# Patient Record
Sex: Female | Born: 1975 | Race: White | Hispanic: No | Smoking: Former smoker
Health system: Southern US, Community
[De-identification: ages and names within clinical notes are randomized; demographics above are authoritative.]

## PROBLEM LIST (undated history)

## (undated) ENCOUNTER — Inpatient Hospital Stay (HOSPITAL_COMMUNITY): Payer: Self-pay

## (undated) ENCOUNTER — Inpatient Hospital Stay: Payer: Self-pay

## (undated) DIAGNOSIS — B009 Herpesviral infection, unspecified: Secondary | ICD-10-CM

## (undated) DIAGNOSIS — F419 Anxiety disorder, unspecified: Secondary | ICD-10-CM

## (undated) DIAGNOSIS — R51 Headache: Secondary | ICD-10-CM

## (undated) DIAGNOSIS — G8929 Other chronic pain: Secondary | ICD-10-CM

## (undated) DIAGNOSIS — R946 Abnormal results of thyroid function studies: Secondary | ICD-10-CM

## (undated) DIAGNOSIS — J189 Pneumonia, unspecified organism: Secondary | ICD-10-CM

## (undated) DIAGNOSIS — R519 Headache, unspecified: Secondary | ICD-10-CM

## (undated) DIAGNOSIS — R590 Localized enlarged lymph nodes: Secondary | ICD-10-CM

## (undated) DIAGNOSIS — M5416 Radiculopathy, lumbar region: Secondary | ICD-10-CM

## (undated) DIAGNOSIS — N390 Urinary tract infection, site not specified: Secondary | ICD-10-CM

## (undated) DIAGNOSIS — R112 Nausea with vomiting, unspecified: Secondary | ICD-10-CM

## (undated) DIAGNOSIS — R002 Palpitations: Secondary | ICD-10-CM

## (undated) DIAGNOSIS — O009 Unspecified ectopic pregnancy without intrauterine pregnancy: Secondary | ICD-10-CM

## (undated) DIAGNOSIS — N920 Excessive and frequent menstruation with regular cycle: Secondary | ICD-10-CM

## (undated) DIAGNOSIS — Z9889 Other specified postprocedural states: Secondary | ICD-10-CM

## (undated) DIAGNOSIS — Z87442 Personal history of urinary calculi: Secondary | ICD-10-CM

## (undated) DIAGNOSIS — D649 Anemia, unspecified: Secondary | ICD-10-CM

## (undated) DIAGNOSIS — F99 Mental disorder, not otherwise specified: Secondary | ICD-10-CM

## (undated) DIAGNOSIS — N200 Calculus of kidney: Secondary | ICD-10-CM

## (undated) DIAGNOSIS — R569 Unspecified convulsions: Secondary | ICD-10-CM

## (undated) DIAGNOSIS — M549 Dorsalgia, unspecified: Secondary | ICD-10-CM

## (undated) HISTORY — DX: Urinary tract infection, site not specified: N39.0

## (undated) HISTORY — PX: LITHOTRIPSY: SUR834

## (undated) HISTORY — PX: TUBAL LIGATION: SHX77

## (undated) HISTORY — PX: CYSTOSCOPY/RETROGRADE/URETEROSCOPY: SHX5316

---

## 1998-12-18 ENCOUNTER — Ambulatory Visit (HOSPITAL_COMMUNITY): Admission: RE | Admit: 1998-12-18 | Discharge: 1998-12-18 | Payer: Self-pay | Admitting: Emergency Medicine

## 1999-04-21 ENCOUNTER — Other Ambulatory Visit: Admission: RE | Admit: 1999-04-21 | Discharge: 1999-04-21 | Payer: Self-pay | Admitting: Obstetrics and Gynecology

## 1999-12-21 ENCOUNTER — Ambulatory Visit (HOSPITAL_COMMUNITY): Admission: RE | Admit: 1999-12-21 | Discharge: 1999-12-21 | Payer: Self-pay | Admitting: Obstetrics and Gynecology

## 2000-04-19 ENCOUNTER — Other Ambulatory Visit: Admission: RE | Admit: 2000-04-19 | Discharge: 2000-04-19 | Payer: Self-pay | Admitting: Obstetrics and Gynecology

## 2000-07-07 ENCOUNTER — Emergency Department (HOSPITAL_COMMUNITY): Admission: EM | Admit: 2000-07-07 | Discharge: 2000-07-07 | Payer: Self-pay | Admitting: Emergency Medicine

## 2000-11-28 ENCOUNTER — Emergency Department (HOSPITAL_COMMUNITY): Admission: EM | Admit: 2000-11-28 | Discharge: 2000-11-28 | Payer: Self-pay | Admitting: Emergency Medicine

## 2000-11-30 ENCOUNTER — Emergency Department (HOSPITAL_COMMUNITY): Admission: EM | Admit: 2000-11-30 | Discharge: 2000-11-30 | Payer: Self-pay | Admitting: *Deleted

## 2001-05-20 ENCOUNTER — Other Ambulatory Visit: Admission: RE | Admit: 2001-05-20 | Discharge: 2001-05-20 | Payer: Self-pay | Admitting: Obstetrics and Gynecology

## 2001-08-18 ENCOUNTER — Ambulatory Visit (HOSPITAL_COMMUNITY): Admission: RE | Admit: 2001-08-18 | Discharge: 2001-08-18 | Payer: Self-pay

## 2002-05-22 ENCOUNTER — Other Ambulatory Visit: Admission: RE | Admit: 2002-05-22 | Discharge: 2002-05-22 | Payer: Self-pay | Admitting: Obstetrics and Gynecology

## 2002-06-09 ENCOUNTER — Emergency Department (HOSPITAL_COMMUNITY): Admission: EM | Admit: 2002-06-09 | Discharge: 2002-06-09 | Payer: Self-pay | Admitting: Emergency Medicine

## 2002-06-09 ENCOUNTER — Encounter: Payer: Self-pay | Admitting: Emergency Medicine

## 2002-06-19 ENCOUNTER — Encounter: Payer: Self-pay | Admitting: Emergency Medicine

## 2002-06-19 ENCOUNTER — Emergency Department (HOSPITAL_COMMUNITY): Admission: EM | Admit: 2002-06-19 | Discharge: 2002-06-19 | Payer: Self-pay | Admitting: Emergency Medicine

## 2003-05-24 ENCOUNTER — Other Ambulatory Visit: Admission: RE | Admit: 2003-05-24 | Discharge: 2003-05-24 | Payer: Self-pay | Admitting: Obstetrics and Gynecology

## 2003-06-30 ENCOUNTER — Emergency Department (HOSPITAL_COMMUNITY): Admission: EM | Admit: 2003-06-30 | Discharge: 2003-06-30 | Payer: Self-pay | Admitting: Emergency Medicine

## 2003-08-10 ENCOUNTER — Emergency Department (HOSPITAL_COMMUNITY): Admission: EM | Admit: 2003-08-10 | Discharge: 2003-08-10 | Payer: Self-pay | Admitting: Emergency Medicine

## 2003-12-21 ENCOUNTER — Emergency Department (HOSPITAL_COMMUNITY): Admission: EM | Admit: 2003-12-21 | Discharge: 2003-12-21 | Payer: Self-pay | Admitting: Emergency Medicine

## 2004-01-26 ENCOUNTER — Emergency Department (HOSPITAL_COMMUNITY): Admission: EM | Admit: 2004-01-26 | Discharge: 2004-01-26 | Payer: Self-pay | Admitting: Emergency Medicine

## 2004-04-05 ENCOUNTER — Ambulatory Visit (HOSPITAL_COMMUNITY): Admission: RE | Admit: 2004-04-05 | Discharge: 2004-04-05 | Payer: Self-pay | Admitting: *Deleted

## 2004-06-16 ENCOUNTER — Other Ambulatory Visit: Admission: RE | Admit: 2004-06-16 | Discharge: 2004-06-16 | Payer: Self-pay | Admitting: Obstetrics and Gynecology

## 2005-07-02 ENCOUNTER — Other Ambulatory Visit: Admission: RE | Admit: 2005-07-02 | Discharge: 2005-07-02 | Payer: Self-pay | Admitting: Obstetrics and Gynecology

## 2005-09-18 ENCOUNTER — Emergency Department (HOSPITAL_COMMUNITY): Admission: EM | Admit: 2005-09-18 | Discharge: 2005-09-18 | Payer: Self-pay | Admitting: Emergency Medicine

## 2005-10-08 ENCOUNTER — Ambulatory Visit (HOSPITAL_COMMUNITY): Admission: AD | Admit: 2005-10-08 | Discharge: 2005-10-08 | Payer: Self-pay | Admitting: Urology

## 2005-10-19 ENCOUNTER — Ambulatory Visit (HOSPITAL_COMMUNITY): Admission: RE | Admit: 2005-10-19 | Discharge: 2005-10-19 | Payer: Self-pay | Admitting: Urology

## 2006-01-10 ENCOUNTER — Ambulatory Visit (HOSPITAL_COMMUNITY): Admission: RE | Admit: 2006-01-10 | Discharge: 2006-01-10 | Payer: Self-pay | Admitting: Obstetrics and Gynecology

## 2006-02-05 ENCOUNTER — Emergency Department (HOSPITAL_COMMUNITY): Admission: EM | Admit: 2006-02-05 | Discharge: 2006-02-05 | Payer: Self-pay | Admitting: Emergency Medicine

## 2006-02-06 ENCOUNTER — Ambulatory Visit (HOSPITAL_COMMUNITY): Admission: RE | Admit: 2006-02-06 | Discharge: 2006-02-06 | Payer: Self-pay | Admitting: Obstetrics and Gynecology

## 2006-02-26 ENCOUNTER — Emergency Department (HOSPITAL_COMMUNITY): Admission: EM | Admit: 2006-02-26 | Discharge: 2006-02-26 | Payer: Self-pay | Admitting: Emergency Medicine

## 2006-03-24 ENCOUNTER — Emergency Department (HOSPITAL_COMMUNITY): Admission: EM | Admit: 2006-03-24 | Discharge: 2006-03-24 | Payer: Self-pay | Admitting: Emergency Medicine

## 2006-05-06 ENCOUNTER — Ambulatory Visit (HOSPITAL_COMMUNITY): Admission: RE | Admit: 2006-05-06 | Discharge: 2006-05-06 | Payer: Self-pay | Admitting: Obstetrics and Gynecology

## 2006-05-29 ENCOUNTER — Emergency Department (HOSPITAL_COMMUNITY): Admission: EM | Admit: 2006-05-29 | Discharge: 2006-05-29 | Payer: Self-pay | Admitting: Emergency Medicine

## 2006-06-17 ENCOUNTER — Ambulatory Visit (HOSPITAL_COMMUNITY): Admission: RE | Admit: 2006-06-17 | Discharge: 2006-06-17 | Payer: Self-pay | Admitting: Obstetrics and Gynecology

## 2006-07-05 ENCOUNTER — Other Ambulatory Visit: Admission: RE | Admit: 2006-07-05 | Discharge: 2006-07-05 | Payer: Self-pay | Admitting: Obstetrics and Gynecology

## 2006-07-29 ENCOUNTER — Ambulatory Visit (HOSPITAL_COMMUNITY): Admission: RE | Admit: 2006-07-29 | Discharge: 2006-07-29 | Payer: Self-pay | Admitting: Diagnostic Radiology

## 2006-08-12 ENCOUNTER — Ambulatory Visit: Payer: Self-pay | Admitting: Family Medicine

## 2006-11-05 HISTORY — PX: LAPAROSCOPY FOR ECTOPIC PREGNANCY: SUR765

## 2006-11-14 ENCOUNTER — Emergency Department (HOSPITAL_COMMUNITY): Admission: EM | Admit: 2006-11-14 | Discharge: 2006-11-14 | Payer: Self-pay | Admitting: Emergency Medicine

## 2006-11-25 ENCOUNTER — Emergency Department (HOSPITAL_COMMUNITY): Admission: EM | Admit: 2006-11-25 | Discharge: 2006-11-25 | Payer: Self-pay | Admitting: Family Medicine

## 2006-11-28 ENCOUNTER — Emergency Department (HOSPITAL_COMMUNITY): Admission: EM | Admit: 2006-11-28 | Discharge: 2006-11-28 | Payer: Self-pay | Admitting: Family Medicine

## 2006-12-03 ENCOUNTER — Ambulatory Visit: Payer: Self-pay | Admitting: Internal Medicine

## 2006-12-06 ENCOUNTER — Emergency Department (HOSPITAL_COMMUNITY): Admission: EM | Admit: 2006-12-06 | Discharge: 2006-12-06 | Payer: Self-pay | Admitting: Emergency Medicine

## 2006-12-06 ENCOUNTER — Inpatient Hospital Stay (HOSPITAL_COMMUNITY): Admission: RE | Admit: 2006-12-06 | Discharge: 2006-12-08 | Payer: Self-pay | Admitting: Psychiatry

## 2006-12-06 ENCOUNTER — Ambulatory Visit: Payer: Self-pay | Admitting: Psychiatry

## 2007-02-02 ENCOUNTER — Emergency Department (HOSPITAL_COMMUNITY): Admission: EM | Admit: 2007-02-02 | Discharge: 2007-02-02 | Payer: Self-pay | Admitting: Emergency Medicine

## 2007-02-03 ENCOUNTER — Ambulatory Visit: Payer: Self-pay | Admitting: Cardiovascular Disease

## 2007-02-05 ENCOUNTER — Other Ambulatory Visit (HOSPITAL_COMMUNITY): Admission: RE | Admit: 2007-02-05 | Discharge: 2007-02-21 | Payer: Self-pay | Admitting: Psychiatry

## 2007-02-07 ENCOUNTER — Ambulatory Visit: Payer: Self-pay | Admitting: Psychiatry

## 2007-02-17 ENCOUNTER — Ambulatory Visit: Payer: Self-pay

## 2007-02-17 ENCOUNTER — Encounter: Payer: Self-pay | Admitting: Cardiology

## 2007-02-18 ENCOUNTER — Ambulatory Visit: Payer: Self-pay | Admitting: Internal Medicine

## 2007-03-13 ENCOUNTER — Inpatient Hospital Stay (HOSPITAL_COMMUNITY): Admission: AD | Admit: 2007-03-13 | Discharge: 2007-03-13 | Payer: Self-pay | Admitting: Obstetrics and Gynecology

## 2007-03-14 ENCOUNTER — Ambulatory Visit (HOSPITAL_COMMUNITY): Admission: AD | Admit: 2007-03-14 | Discharge: 2007-03-14 | Payer: Self-pay | Admitting: Obstetrics and Gynecology

## 2007-03-14 ENCOUNTER — Encounter (INDEPENDENT_AMBULATORY_CARE_PROVIDER_SITE_OTHER): Payer: Self-pay | Admitting: Specialist

## 2007-05-13 ENCOUNTER — Encounter: Payer: Self-pay | Admitting: Internal Medicine

## 2007-05-13 ENCOUNTER — Emergency Department (HOSPITAL_COMMUNITY): Admission: EM | Admit: 2007-05-13 | Discharge: 2007-05-13 | Payer: Self-pay | Admitting: Emergency Medicine

## 2007-05-14 ENCOUNTER — Ambulatory Visit (HOSPITAL_COMMUNITY): Admission: RE | Admit: 2007-05-14 | Discharge: 2007-05-14 | Payer: Self-pay | Admitting: *Deleted

## 2007-05-16 ENCOUNTER — Emergency Department (HOSPITAL_COMMUNITY): Admission: EM | Admit: 2007-05-16 | Discharge: 2007-05-16 | Payer: Self-pay | Admitting: Emergency Medicine

## 2007-05-22 ENCOUNTER — Ambulatory Visit: Payer: Self-pay | Admitting: Internal Medicine

## 2007-05-22 DIAGNOSIS — G43009 Migraine without aura, not intractable, without status migrainosus: Secondary | ICD-10-CM | POA: Insufficient documentation

## 2007-05-29 ENCOUNTER — Ambulatory Visit: Payer: Self-pay | Admitting: Cardiovascular Disease

## 2007-06-02 ENCOUNTER — Telehealth (INDEPENDENT_AMBULATORY_CARE_PROVIDER_SITE_OTHER): Payer: Self-pay | Admitting: *Deleted

## 2007-06-16 ENCOUNTER — Ambulatory Visit (HOSPITAL_COMMUNITY): Admission: RE | Admit: 2007-06-16 | Discharge: 2007-06-16 | Payer: Self-pay | Admitting: Interventional Radiology

## 2007-07-18 ENCOUNTER — Emergency Department (HOSPITAL_COMMUNITY): Admission: EM | Admit: 2007-07-18 | Discharge: 2007-07-18 | Payer: Self-pay | Admitting: Emergency Medicine

## 2007-09-08 ENCOUNTER — Emergency Department (HOSPITAL_COMMUNITY): Admission: EM | Admit: 2007-09-08 | Discharge: 2007-09-08 | Payer: Self-pay | Admitting: *Deleted

## 2007-11-11 ENCOUNTER — Emergency Department (HOSPITAL_COMMUNITY): Admission: EM | Admit: 2007-11-11 | Discharge: 2007-11-11 | Payer: Self-pay | Admitting: Emergency Medicine

## 2007-11-30 ENCOUNTER — Emergency Department (HOSPITAL_COMMUNITY): Admission: EM | Admit: 2007-11-30 | Discharge: 2007-11-30 | Payer: Self-pay | Admitting: Emergency Medicine

## 2007-12-09 ENCOUNTER — Ambulatory Visit (HOSPITAL_COMMUNITY): Admission: RE | Admit: 2007-12-09 | Discharge: 2007-12-09 | Payer: Self-pay | Admitting: Urology

## 2007-12-18 ENCOUNTER — Emergency Department (HOSPITAL_COMMUNITY): Admission: EM | Admit: 2007-12-18 | Discharge: 2007-12-18 | Payer: Self-pay | Admitting: Family Medicine

## 2008-01-02 ENCOUNTER — Emergency Department (HOSPITAL_COMMUNITY): Admission: EM | Admit: 2008-01-02 | Discharge: 2008-01-02 | Payer: Self-pay | Admitting: Emergency Medicine

## 2008-01-05 ENCOUNTER — Ambulatory Visit (HOSPITAL_COMMUNITY): Admission: RE | Admit: 2008-01-05 | Discharge: 2008-01-05 | Payer: Self-pay | Admitting: Urology

## 2008-01-07 ENCOUNTER — Emergency Department (HOSPITAL_COMMUNITY): Admission: EM | Admit: 2008-01-07 | Discharge: 2008-01-08 | Payer: Self-pay | Admitting: Emergency Medicine

## 2008-02-04 ENCOUNTER — Emergency Department (HOSPITAL_COMMUNITY): Admission: EM | Admit: 2008-02-04 | Discharge: 2008-02-04 | Payer: Self-pay | Admitting: Emergency Medicine

## 2008-04-15 ENCOUNTER — Emergency Department (HOSPITAL_COMMUNITY): Admission: EM | Admit: 2008-04-15 | Discharge: 2008-04-15 | Payer: Self-pay | Admitting: Emergency Medicine

## 2008-05-17 ENCOUNTER — Encounter: Admission: RE | Admit: 2008-05-17 | Discharge: 2008-06-10 | Payer: Self-pay | Admitting: Internal Medicine

## 2008-05-29 ENCOUNTER — Emergency Department: Payer: Self-pay | Admitting: Emergency Medicine

## 2008-07-05 ENCOUNTER — Ambulatory Visit: Payer: Self-pay | Admitting: Cardiovascular Disease

## 2008-07-07 ENCOUNTER — Ambulatory Visit: Payer: Self-pay | Admitting: Cardiovascular Disease

## 2008-07-07 ENCOUNTER — Emergency Department (HOSPITAL_COMMUNITY): Admission: EM | Admit: 2008-07-07 | Discharge: 2008-07-07 | Payer: Self-pay | Admitting: Emergency Medicine

## 2008-07-28 ENCOUNTER — Emergency Department (HOSPITAL_COMMUNITY): Admission: EM | Admit: 2008-07-28 | Discharge: 2008-07-28 | Payer: Self-pay | Admitting: Emergency Medicine

## 2008-08-03 ENCOUNTER — Ambulatory Visit: Payer: Self-pay | Admitting: Internal Medicine

## 2008-08-03 DIAGNOSIS — R002 Palpitations: Secondary | ICD-10-CM | POA: Insufficient documentation

## 2008-08-03 DIAGNOSIS — Z87442 Personal history of urinary calculi: Secondary | ICD-10-CM

## 2008-08-03 DIAGNOSIS — R5381 Other malaise: Secondary | ICD-10-CM

## 2008-08-03 DIAGNOSIS — R5383 Other fatigue: Secondary | ICD-10-CM

## 2008-08-03 DIAGNOSIS — F411 Generalized anxiety disorder: Secondary | ICD-10-CM | POA: Insufficient documentation

## 2008-08-03 DIAGNOSIS — R946 Abnormal results of thyroid function studies: Secondary | ICD-10-CM

## 2008-08-04 ENCOUNTER — Ambulatory Visit: Payer: Self-pay | Admitting: Internal Medicine

## 2008-08-09 ENCOUNTER — Encounter: Payer: Self-pay | Admitting: Internal Medicine

## 2008-08-09 ENCOUNTER — Telehealth: Payer: Self-pay | Admitting: Internal Medicine

## 2008-08-10 ENCOUNTER — Ambulatory Visit: Payer: Self-pay | Admitting: Internal Medicine

## 2008-08-10 ENCOUNTER — Telehealth (INDEPENDENT_AMBULATORY_CARE_PROVIDER_SITE_OTHER): Payer: Self-pay | Admitting: *Deleted

## 2008-08-13 ENCOUNTER — Telehealth (INDEPENDENT_AMBULATORY_CARE_PROVIDER_SITE_OTHER): Payer: Self-pay | Admitting: *Deleted

## 2008-08-13 LAB — CONVERTED CEMR LAB
T3, Free: 3.1 pg/mL (ref 2.3–4.2)
TSH: 1.31 microintl units/mL (ref 0.35–5.50)

## 2008-08-17 LAB — CONVERTED CEMR LAB
Catecholamines Tot(E+NE) 24 Hr U: 0.011 mg/24hr
Dopamine 24 Hr Urine: 74 mcg/24hr (ref <500)
Norepinephrine 24 Hr Urine: 10 mcg/24hr (ref <80)

## 2008-09-02 ENCOUNTER — Ambulatory Visit: Payer: Self-pay | Admitting: Internal Medicine

## 2008-09-02 DIAGNOSIS — M545 Low back pain: Secondary | ICD-10-CM | POA: Insufficient documentation

## 2008-09-07 ENCOUNTER — Encounter (INDEPENDENT_AMBULATORY_CARE_PROVIDER_SITE_OTHER): Payer: Self-pay | Admitting: *Deleted

## 2008-09-23 ENCOUNTER — Ambulatory Visit: Payer: Self-pay | Admitting: Family Medicine

## 2008-11-30 ENCOUNTER — Encounter: Payer: Self-pay | Admitting: Internal Medicine

## 2008-12-09 ENCOUNTER — Encounter: Payer: Self-pay | Admitting: Internal Medicine

## 2008-12-09 ENCOUNTER — Ambulatory Visit (HOSPITAL_COMMUNITY): Admission: RE | Admit: 2008-12-09 | Discharge: 2008-12-09 | Payer: Self-pay | Admitting: Gastroenterology

## 2008-12-09 ENCOUNTER — Encounter (INDEPENDENT_AMBULATORY_CARE_PROVIDER_SITE_OTHER): Payer: Self-pay | Admitting: Gastroenterology

## 2009-02-02 ENCOUNTER — Emergency Department (HOSPITAL_COMMUNITY): Admission: EM | Admit: 2009-02-02 | Discharge: 2009-02-02 | Payer: Self-pay | Admitting: Emergency Medicine

## 2009-02-16 ENCOUNTER — Emergency Department (HOSPITAL_COMMUNITY): Admission: EM | Admit: 2009-02-16 | Discharge: 2009-02-16 | Payer: Self-pay | Admitting: Emergency Medicine

## 2009-02-23 ENCOUNTER — Emergency Department (HOSPITAL_COMMUNITY): Admission: EM | Admit: 2009-02-23 | Discharge: 2009-02-23 | Payer: Self-pay | Admitting: Family Medicine

## 2009-03-18 ENCOUNTER — Emergency Department (HOSPITAL_BASED_OUTPATIENT_CLINIC_OR_DEPARTMENT_OTHER): Admission: EM | Admit: 2009-03-18 | Discharge: 2009-03-18 | Payer: Self-pay | Admitting: Emergency Medicine

## 2009-03-18 ENCOUNTER — Ambulatory Visit: Payer: Self-pay | Admitting: Diagnostic Radiology

## 2009-04-19 ENCOUNTER — Ambulatory Visit (HOSPITAL_BASED_OUTPATIENT_CLINIC_OR_DEPARTMENT_OTHER): Admission: RE | Admit: 2009-04-19 | Discharge: 2009-04-19 | Payer: Self-pay | Admitting: Urology

## 2009-04-20 ENCOUNTER — Emergency Department (HOSPITAL_COMMUNITY): Admission: EM | Admit: 2009-04-20 | Discharge: 2009-04-20 | Payer: Self-pay | Admitting: Emergency Medicine

## 2009-04-27 ENCOUNTER — Emergency Department (HOSPITAL_COMMUNITY): Admission: EM | Admit: 2009-04-27 | Discharge: 2009-04-27 | Payer: Self-pay | Admitting: Emergency Medicine

## 2009-05-11 ENCOUNTER — Ambulatory Visit: Payer: Self-pay | Admitting: Internal Medicine

## 2009-05-11 DIAGNOSIS — F19939 Other psychoactive substance use, unspecified with withdrawal, unspecified: Secondary | ICD-10-CM | POA: Insufficient documentation

## 2009-08-28 ENCOUNTER — Emergency Department (HOSPITAL_COMMUNITY): Admission: EM | Admit: 2009-08-28 | Discharge: 2009-08-28 | Payer: Self-pay | Admitting: Family Medicine

## 2009-09-06 ENCOUNTER — Ambulatory Visit: Payer: Self-pay | Admitting: Internal Medicine

## 2009-09-06 DIAGNOSIS — R599 Enlarged lymph nodes, unspecified: Secondary | ICD-10-CM | POA: Insufficient documentation

## 2009-09-06 LAB — CONVERTED CEMR LAB
Eosinophils Relative: 3.3 % (ref 0.0–5.0)
HCT: 42.3 % (ref 36.0–46.0)
Hemoglobin: 14.2 g/dL (ref 12.0–15.0)
Lymphs Abs: 1.6 10*3/uL (ref 0.7–4.0)
MCV: 98.6 fL (ref 78.0–100.0)
Monocytes Absolute: 0.4 10*3/uL (ref 0.1–1.0)
Neutro Abs: 4 10*3/uL (ref 1.4–7.7)
Platelets: 268 10*3/uL (ref 150.0–400.0)
RDW: 11.9 % (ref 11.5–14.6)
Sed Rate: 10 mm/hr (ref 0–22)
WBC: 6.3 10*3/uL (ref 4.5–10.5)

## 2009-09-07 ENCOUNTER — Encounter (INDEPENDENT_AMBULATORY_CARE_PROVIDER_SITE_OTHER): Payer: Self-pay | Admitting: *Deleted

## 2009-09-13 ENCOUNTER — Telehealth (INDEPENDENT_AMBULATORY_CARE_PROVIDER_SITE_OTHER): Payer: Self-pay | Admitting: *Deleted

## 2009-09-14 ENCOUNTER — Ambulatory Visit: Payer: Self-pay | Admitting: Internal Medicine

## 2009-09-14 ENCOUNTER — Telehealth (INDEPENDENT_AMBULATORY_CARE_PROVIDER_SITE_OTHER): Payer: Self-pay | Admitting: *Deleted

## 2009-09-14 ENCOUNTER — Encounter (INDEPENDENT_AMBULATORY_CARE_PROVIDER_SITE_OTHER): Payer: Self-pay | Admitting: *Deleted

## 2009-09-19 ENCOUNTER — Telehealth (INDEPENDENT_AMBULATORY_CARE_PROVIDER_SITE_OTHER): Payer: Self-pay | Admitting: *Deleted

## 2009-09-19 ENCOUNTER — Ambulatory Visit: Payer: Self-pay | Admitting: Internal Medicine

## 2009-09-19 DIAGNOSIS — J309 Allergic rhinitis, unspecified: Secondary | ICD-10-CM | POA: Insufficient documentation

## 2009-11-06 ENCOUNTER — Emergency Department (HOSPITAL_BASED_OUTPATIENT_CLINIC_OR_DEPARTMENT_OTHER): Admission: EM | Admit: 2009-11-06 | Discharge: 2009-11-06 | Payer: Self-pay | Admitting: Emergency Medicine

## 2009-11-16 ENCOUNTER — Emergency Department (HOSPITAL_COMMUNITY): Admission: RE | Admit: 2009-11-16 | Discharge: 2009-11-16 | Payer: Self-pay | Admitting: Radiology

## 2009-12-13 ENCOUNTER — Emergency Department (HOSPITAL_COMMUNITY): Admission: EM | Admit: 2009-12-13 | Discharge: 2009-12-13 | Payer: Self-pay | Admitting: Family Medicine

## 2010-01-11 ENCOUNTER — Encounter (INDEPENDENT_AMBULATORY_CARE_PROVIDER_SITE_OTHER): Payer: Self-pay | Admitting: *Deleted

## 2010-02-09 ENCOUNTER — Ambulatory Visit: Payer: Self-pay | Admitting: Internal Medicine

## 2010-03-08 ENCOUNTER — Emergency Department (HOSPITAL_COMMUNITY): Admission: EM | Admit: 2010-03-08 | Discharge: 2010-03-08 | Payer: Self-pay | Admitting: Emergency Medicine

## 2010-03-08 ENCOUNTER — Ambulatory Visit (HOSPITAL_COMMUNITY)
Admission: RE | Admit: 2010-03-08 | Discharge: 2010-03-08 | Payer: Self-pay | Admitting: Physical Medicine and Rehabilitation

## 2010-05-15 ENCOUNTER — Emergency Department (HOSPITAL_BASED_OUTPATIENT_CLINIC_OR_DEPARTMENT_OTHER)
Admission: EM | Admit: 2010-05-15 | Discharge: 2010-05-15 | Payer: Self-pay | Source: Home / Self Care | Admitting: Emergency Medicine

## 2010-09-20 ENCOUNTER — Emergency Department (HOSPITAL_COMMUNITY): Admission: EM | Admit: 2010-09-20 | Discharge: 2010-09-20 | Payer: Self-pay | Admitting: Emergency Medicine

## 2010-11-01 ENCOUNTER — Encounter: Payer: Self-pay | Admitting: Internal Medicine

## 2010-11-05 NOTE — L&D Delivery Note (Signed)
Delivery Note Pt progressed to complete and pushed well.  At 5:19 PM a viable and healthy female was delivered via Vaginal, Spontaneous Delivery (Presentation: Left Occiput Anterior).  APGAR: 9, 9; weight 7 lb 2.6 oz (3250 g).   Placenta status: Intact, Spontaneous.  Cord: 3 vessels with the following complications:  None  Anesthesia: Epidural  Episiotomy: None Lacerations: Labial Suture Repair: 3.0 vicryl Est. Blood Loss (mL): 400  Mom to postpartum.  Baby to nursery-stable.  [name] D 10/31/2011, 5:42 PM

## 2010-12-03 LAB — CONVERTED CEMR LAB
AST: 14 units/L (ref 0–37)
Albumin: 4.5 g/dL (ref 3.5–5.2)
Alkaline Phosphatase: 45 units/L (ref 39–117)
Basophils Relative: 0.9 % (ref 0.0–3.0)
CO2: 26 meq/L (ref 19–32)
Calcium: 9.2 mg/dL (ref 8.4–10.5)
Calcium: 9.3 mg/dL (ref 8.4–10.5)
Creatinine, Ser: 0.8 mg/dL (ref 0.4–1.2)
Eosinophils Absolute: 0.5 10*3/uL (ref 0.0–0.7)
Eosinophils Relative: 8.5 % — ABNORMAL HIGH (ref 0.0–5.0)
Free T4: 0.7 ng/dL (ref 0.6–1.6)
GFR calc non Af Amer: 86.66 mL/min (ref >60)
Hemoglobin: 15.3 g/dL — ABNORMAL HIGH (ref 12.0–15.0)
Lymphocytes Relative: 23.9 % (ref 12.0–46.0)
MCHC: 33.8 g/dL (ref 30.0–36.0)
Monocytes Relative: 5 % (ref 3.0–12.0)
Neutro Abs: 3.5 10*3/uL (ref 1.4–7.7)
Neutrophils Relative %: 61.7 % (ref 43.0–77.0)
RBC: 4.69 M/uL (ref 3.87–5.11)
Sodium: 139 meq/L (ref 135–145)
T3, Free: 2.9 pg/mL (ref 2.3–4.2)
T4, Total: 8.6 ug/dL (ref 5.0–12.5)
Total Protein: 7.5 g/dL (ref 6.0–8.3)
WBC: 5.8 10*3/uL (ref 4.5–10.5)

## 2010-12-07 NOTE — Assessment & Plan Note (Signed)
Summary: np6/ tachycardia   History of Present Illness: [name] is referred today by Dr. Eden Emms for evaluation of palpitations.  She is a fairly anxious woman with a h/o palpitations for many years.  She states that her work is very stressful (Museum/gallery exhibitions officer) and that she is quite tired by the time she get home at night.  She does not exercise regularly.  She has been on long acting metoprolol but states that she feels some fatigue.  Her palpitations are minimally improved on beta blockers.  She feels like she is always under stress.  Current Medications (verified): 1)  Xanax 1 Mg  Tabs (Alprazolam) .... Prn 2)  Cymbalta 60 Mg Cpep (Duloxetine Hcl) .Marland Kitchen.. 1 By Mouth Two Times A Day 3)  Provigil 200 Mg Tabs (Modafinil) .Marland Kitchen.. 1 By Mouth Once Daily 4)  Deplin 15 Mg Tabs (L-Methylfolate) .Marland Kitchen.. 1 By Mouth Once Daily 5)  Rapid Flow .... 1 By Mouth Once Daily 6)  Lamictal 100 Mg Tabs (Lamotrigine) .Marland Kitchen.. 1 By Mouth Once Daily 7)  Urocit-K 15 15 Meq (1620 Mg) Cr-Tabs (Potassium Citrate) .Marland Kitchen.. 1 By Mouth Once Daily 8)  Metoprolol Succinate 25 Mg Xr24h-Tab (Metoprolol Succinate) .... Take One Tablet By Mouth Daily 9)  Norco 7.5-325 Mg Tabs (Hydrocodone-Acetaminophen) .... Uad  Allergies: 1)  ! Compazine 2)  ! 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) benign no structural heart disease related to anxiety ABNORMAL THYROID FUNCTION TESTS (ICD-794.5) NEPHROLITHIASIS, HX OF (ICD-V13.01) ANXIETY (ICD-300.00) MIGRAINE WITHOUT AURA (ICD-346.10)  Past Surgical History: Last updated: 08/03/2008 G1P0 , ectopic pregnancy 2009 ; Basket retrieval x 2 & Lithotripsy X 1 Laparotomy-exploratory  Family History: Last updated: 08/03/2008 Father: neg Mother: neg Siblings: none; no FH renal calculi or thyroid disease  Social History: Last updated:  08/03/2008 Occupation: CT Tech Never Smoked Alcohol use-no Regular exercise-no  Review of Systems       All systems reviewed and negative except as noted in the HPI.  Vital Signs:  Patient profile:   35 year old female Height:      63 inches Weight:      124 pounds BMI:     22.05 Pulse rate:   107 / minute Pulse (ortho):   82 / minute BP sitting:   134 / 90 BP standing:   139 / 92  Vitals Entered By: Laurance Flatten CMA (February 09, 2010 10:24 AM)  Serial Vital Signs/Assessments:  Time      Position  BP       Pulse  Resp  Temp     By 1040      Lying RA  121/79   96                    Sherri Rad, RN, BSN 1040      Sitting   117/86   106                   Sherri Rad, Charity fundraiser, BSN 1040      Standing  139/92   82                    Sherri Rad, Charity fundraiser, BSN  Comments: Standing (2 min) 136/91 HR- 102 Standing (5 min) 125/90 HR- 101 By: Sherri Rad, RN, BSN    Physical Exam  General:  Well developed, well nourished, in  no acute distress.  HEENT: normal Neck: supple. No JVD. Carotids 2+ bilaterally no bruits Cor: Regular tachy with no rubs, gallops or murmur Lungs: CTA Ab: soft, nontender. nondistended. No HSM. Good bowel sounds Ext: warm. no cyanosis, clubbing or edema Neuro: alert and oriented. Grossly nonfocal. affect pleasant    EKG  Procedure date:  02/09/2010  Findings:      Sinus tachycardia with rate of:  107.  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  She does not have much evidence of orthstasis today.  No evidence of POTS.  I have discussed the benign nature of her condition, and ways to improve her symptoms which include less caffiene, more exercise and sleep, controlling anxiety.  She agrees to try and make some lifestyle changes.  I will see her back as needed. Her updated medication list for this problem includes:    Metoprolol  Succinate 25 Mg Xr24h-tab (Metoprolol succinate) .Marland Kitchen... Take one tablet by mouth daily  Orders: EKG w/ Interpretation (93000)  Problem # 2:  FATIGUE (ICD-780.79) Again, I have recommended regular exercise.  I also asked her to consider working less.  Problem # 3:  ANXIETY STATE, UNSPECIFIED (ICD-300.00) I think this is a large part of her problem.  I have asked that she continue to seek counseling and spent time today making recommendations on how she might improve her anxiety.  Patient Instructions: 1)  We will see you back on an as needed basis.

## 2010-12-07 NOTE — Letter (Signed)
Summary: [facility]   Imported By: Lanelle Bal 11/13/2010 09:51:14  _____________________________________________________________________  External Attachment:    Type:   Image     Comment:   External Document

## 2010-12-07 NOTE — Miscellaneous (Signed)
  Clinical Lists Changes  Medications: Added new medication of METOPROLOL SUCCINATE 25 MG XR24H-TAB (METOPROLOL SUCCINATE) Take one tablet by mouth daily - Signed Rx of METOPROLOL SUCCINATE 25 MG XR24H-TAB (METOPROLOL SUCCINATE) Take one tablet by mouth daily;  #30 x 12;  Signed;  Entered by: Deliah Goody, RN;  Authorized by: Colon Branch, MD, [facility];  Method used: Electronically to [facility]*, [address] Shipping/mailing, [place], [place]  [address], Ph: [phone], Fax: [phone]    Prescriptions: METOPROLOL SUCCINATE 25 MG XR24H-TAB (METOPROLOL SUCCINATE) Take one tablet by mouth daily  #30 x 12   Entered by:   Deliah Goody, RN   Authorized by:   Colon Branch, MD, [facility]   Signed by:   Deliah Goody, RN on 01/11/2010   Method used:   Electronically to        [facility]* (retail)       [address]       [address] Shipping/mailing       [place], [place]  [address]       Ph: [phone]       Fax: [phone]   RxID:   [id number]

## 2010-12-16 ENCOUNTER — Ambulatory Visit: Payer: Self-pay | Admitting: Internal Medicine

## 2011-01-02 ENCOUNTER — Encounter: Payer: Self-pay | Admitting: Internal Medicine

## 2011-01-16 NOTE — Letter (Signed)
Summary: [facility]   Imported By: Maryln Gottron 01/09/2011 13:16:25  _____________________________________________________________________  External Attachment:    Type:   Image     Comment:   External Document

## 2011-01-17 LAB — DIFFERENTIAL
Basophils Relative: 0 % (ref 0–1)
Lymphocytes Relative: 20 % (ref 12–46)
Lymphs Abs: 1.6 10*3/uL (ref 0.7–4.0)
Monocytes Absolute: 0.6 10*3/uL (ref 0.1–1.0)
Monocytes Relative: 7 % (ref 3–12)
Neutro Abs: 5.7 10*3/uL (ref 1.7–7.7)
Neutrophils Relative %: 71 % (ref 43–77)

## 2011-01-17 LAB — CBC
HCT: 39.7 % (ref 36.0–46.0)
Hemoglobin: 13.8 g/dL (ref 12.0–15.0)
RBC: 4.24 MIL/uL (ref 3.87–5.11)

## 2011-01-17 LAB — URINALYSIS, ROUTINE W REFLEX MICROSCOPIC
Bilirubin Urine: NEGATIVE
Glucose, UA: NEGATIVE mg/dL
Specific Gravity, Urine: 1.017 (ref 1.005–1.030)
Urobilinogen, UA: 0.2 mg/dL (ref 0.0–1.0)
pH: 6.5 (ref 5.0–8.0)

## 2011-01-17 LAB — BASIC METABOLIC PANEL
Calcium: 9.5 mg/dL (ref 8.4–10.5)
GFR calc Af Amer: >60 mL/min (ref >60)
GFR calc non Af Amer: >60 mL/min (ref >60)
Glucose, Bld: 104 mg/dL — ABNORMAL HIGH (ref 70–99)
Potassium: 4.1 mEq/L (ref 3.5–5.1)
Sodium: 140 mEq/L (ref 135–145)

## 2011-01-17 LAB — URINE MICROSCOPIC-ADD ON

## 2011-01-17 LAB — URINE CULTURE
Colony Count: NO GROWTH
Culture  Setup Time: 201111161355
Culture: NO GROWTH

## 2011-01-21 LAB — URINE MICROSCOPIC-ADD ON

## 2011-01-21 LAB — URINALYSIS, ROUTINE W REFLEX MICROSCOPIC
Bilirubin Urine: NEGATIVE
Bilirubin Urine: NEGATIVE
Bilirubin Urine: NEGATIVE
Glucose, UA: NEGATIVE mg/dL
Glucose, UA: NEGATIVE mg/dL
Glucose, UA: NEGATIVE mg/dL
Hgb urine dipstick: NEGATIVE
Hgb urine dipstick: NEGATIVE
Ketones, ur: NEGATIVE mg/dL
Protein, ur: NEGATIVE mg/dL
Protein, ur: NEGATIVE mg/dL
Specific Gravity, Urine: >1.046 — ABNORMAL HIGH (ref 1.005–1.030)
Urobilinogen, UA: 0.2 mg/dL (ref 0.0–1.0)
pH: 6.5 (ref 5.0–8.0)
pH: 5.5 (ref 5.0–8.0)

## 2011-01-23 LAB — URINALYSIS, ROUTINE W REFLEX MICROSCOPIC
Glucose, UA: NEGATIVE mg/dL
Hgb urine dipstick: NEGATIVE
Ketones, ur: NEGATIVE mg/dL
pH: 7 (ref 5.0–8.0)

## 2011-01-23 LAB — POCT PREGNANCY, URINE: Preg Test, Ur: NEGATIVE

## 2011-02-12 LAB — CBC
MCHC: 34.2 g/dL (ref 30.0–36.0)
MCV: 89.6 fL (ref 78.0–100.0)
RBC: 4.27 MIL/uL (ref 3.87–5.11)
RBC: 4.91 MIL/uL (ref 3.87–5.11)
WBC: 8.8 10*3/uL (ref 4.0–10.5)
WBC: 9.3 10*3/uL (ref 4.0–10.5)

## 2011-02-12 LAB — URINALYSIS, ROUTINE W REFLEX MICROSCOPIC
Glucose, UA: NEGATIVE mg/dL
Nitrite: NEGATIVE
Protein, ur: NEGATIVE mg/dL
Specific Gravity, Urine: 1.012 (ref 1.005–1.030)
Urobilinogen, UA: 0.2 mg/dL (ref 0.0–1.0)

## 2011-02-12 LAB — BASIC METABOLIC PANEL
Calcium: 9.9 mg/dL (ref 8.4–10.5)
Chloride: 103 mEq/L (ref 96–112)
Creatinine, Ser: 0.73 mg/dL (ref 0.4–1.2)
GFR calc Af Amer: >60 mL/min (ref >60)
GFR calc non Af Amer: >60 mL/min (ref >60)

## 2011-02-12 LAB — POCT HEMOGLOBIN-HEMACUE: Hemoglobin: 13.9 g/dL (ref 12.0–15.0)

## 2011-02-12 LAB — POCT I-STAT, CHEM 8
BUN: 11 mg/dL (ref 6–23)
Creatinine, Ser: 1 mg/dL (ref 0.4–1.2)
Hemoglobin: 12.6 g/dL (ref 12.0–15.0)
Potassium: 3.8 mEq/L (ref 3.5–5.1)
Sodium: 138 mEq/L (ref 135–145)

## 2011-02-12 LAB — DIFFERENTIAL
Lymphocytes Relative: 10 % — ABNORMAL LOW (ref 12–46)
Lymphs Abs: 0.9 10*3/uL (ref 0.7–4.0)
Monocytes Relative: 3 % (ref 3–12)
Neutro Abs: 8.1 10*3/uL — ABNORMAL HIGH (ref 1.7–7.7)
Neutrophils Relative %: 87 % — ABNORMAL HIGH (ref 43–77)

## 2011-02-12 LAB — URINE MICROSCOPIC-ADD ON

## 2011-02-12 LAB — URINE CULTURE
Colony Count: NO GROWTH
Culture: NO GROWTH

## 2011-02-13 LAB — CBC
Hemoglobin: 14.3 g/dL (ref 12.0–15.0)
MCHC: 34.4 g/dL (ref 30.0–36.0)
MCV: 89.2 fL (ref 78.0–100.0)
RBC: 4.66 MIL/uL (ref 3.87–5.11)
RDW: 12.3 % (ref 11.5–15.5)

## 2011-02-13 LAB — BASIC METABOLIC PANEL
CO2: 29 mEq/L (ref 19–32)
Calcium: 9.5 mg/dL (ref 8.4–10.5)
Chloride: 100 mEq/L (ref 96–112)
Creatinine, Ser: 0.8 mg/dL (ref 0.4–1.2)
GFR calc Af Amer: >60 mL/min (ref >60)
Glucose, Bld: 120 mg/dL — ABNORMAL HIGH (ref 70–99)
Sodium: 138 mEq/L (ref 135–145)

## 2011-02-13 LAB — URINALYSIS, ROUTINE W REFLEX MICROSCOPIC
Nitrite: NEGATIVE
Protein, ur: NEGATIVE mg/dL
Specific Gravity, Urine: 1.024 (ref 1.005–1.030)
Urobilinogen, UA: 0.2 mg/dL (ref 0.0–1.0)

## 2011-02-13 LAB — DIFFERENTIAL
Basophils Absolute: 0.1 10*3/uL (ref 0.0–0.1)
Basophils Relative: 1 % (ref 0–1)
Eosinophils Absolute: 0.2 10*3/uL (ref 0.0–0.7)
Monocytes Absolute: 0.6 10*3/uL (ref 0.1–1.0)
Monocytes Relative: 7 % (ref 3–12)
Neutro Abs: 5.6 10*3/uL (ref 1.7–7.7)

## 2011-02-13 LAB — URINE MICROSCOPIC-ADD ON

## 2011-02-13 LAB — PREGNANCY, URINE: Preg Test, Ur: NEGATIVE

## 2011-02-14 LAB — URINALYSIS, ROUTINE W REFLEX MICROSCOPIC
Bilirubin Urine: NEGATIVE
Ketones, ur: NEGATIVE mg/dL
Nitrite: NEGATIVE
Protein, ur: NEGATIVE mg/dL
Urobilinogen, UA: 0.2 mg/dL (ref 0.0–1.0)

## 2011-02-14 LAB — URINE MICROSCOPIC-ADD ON

## 2011-02-14 LAB — URINE CULTURE
Colony Count: NO GROWTH
Culture: NO GROWTH

## 2011-02-15 LAB — PREGNANCY, URINE: Preg Test, Ur: NEGATIVE

## 2011-02-15 LAB — URINALYSIS, ROUTINE W REFLEX MICROSCOPIC
Bilirubin Urine: NEGATIVE
Ketones, ur: NEGATIVE mg/dL
Specific Gravity, Urine: 1.022 (ref 1.005–1.030)
Urobilinogen, UA: 0.2 mg/dL (ref 0.0–1.0)

## 2011-02-15 LAB — URINE MICROSCOPIC-ADD ON

## 2011-02-20 LAB — DIFFERENTIAL
Basophils Absolute: 0 10*3/uL (ref 0.0–0.1)
Eosinophils Absolute: 0.2 10*3/uL (ref 0.0–0.7)
Eosinophils Relative: 3 % (ref 0–5)
Lymphocytes Relative: 15 % (ref 12–46)
Lymphs Abs: 1.1 10*3/uL (ref 0.7–4.0)
Monocytes Absolute: 0.5 10*3/uL (ref 0.1–1.0)

## 2011-02-20 LAB — CBC
HCT: 35.9 % — ABNORMAL LOW (ref 36.0–46.0)
Hemoglobin: 12.8 g/dL (ref 12.0–15.0)
MCV: 89.3 fL (ref 78.0–100.0)
RDW: 13 % (ref 11.5–15.5)

## 2011-03-20 NOTE — Op Note (Signed)
NAME:  [name], Carla                   ACCOUNT NO.:  [id number]   MEDICAL RECORD NO.:  [id number]          PATIENT TYPE:  AMB   LOCATION:  DAY                          FACILITY:  [facility]   PHYSICIAN:  Bertram Millard. Dahlstedt, M.D.DATE OF BIRTH:  Mar 12, 1976   DATE OF PROCEDURE:  12/09/2007  DATE OF DISCHARGE:                               OPERATIVE REPORT   PREOPERATIVE DIAGNOSIS:  Right distal ureteral stone.   POSTOPERATIVE DIAGNOSIS:  Right distal ureteral stone.   CLINICAL PROCEDURES:  Cystoscopy, right stone extraction, right  ureteroscopy.   SURGEON:  Bertram Millard. Dahlstedt, M.D.   ANESTHESIA:  General with LMA.   COMPLICATIONS:  None.   BRIEF HISTORY:  35 year old female with a history of recurrent  urolithiasis.  She had a left ureteral stone which passed last week.  She has had persistent severe pain in the right lower quadrant  associated with, CT confirmed, a right distal ureteral stone with mild  hydronephrosis.  She has remaining mild nephrolithiasis bilaterally.   I saw the patient in the office today.  It took significant amount of IM  pain medicine to get her comfortable.  Due to her nausea, vomiting and  basically intractable pain, it was recommended that we proceed with  intervention to remove the right distal ureteral stone.  We explained  risks and complications of the procedure with Aarin.  She understands  these and desires to proceed.   DESCRIPTION OF PROCEDURE:  The patient was administered preoperative IV  antibiotic, she was identified in the holding area and the surgical side  marked.  She was taken to the operating room where general anesthetic  was administered using the LMA.  She was placed in the dorsal lithotomy  position.  Genitalia and perineum were prepped and draped.  Rigid  cystoscopy was performed.  The stone was crowning of the right  ureteral orifice.  With manipulation of the stone with the scope, it was  dislodged.  It was then extracted.  The  right ureter was then inspected  with the ureteroscope up to the area of the renal pelvis.  There was  significant widening of the ureter, but no further ureteral stones were  seen.  It was not felt that the stent needed to be placed.  The bladder  appeared normal cystoscopically.  It was then drained and the procedure  terminated.  The scope was removed.  The patient was awakened and taken  to PACU in stable condition.   She was discharged on three days of Cipro 250 mg b.i.d. and 10 Percocet  5/325 tablets.  She will follow-up in the office in approximately one  week.      Bertram Millard. Dahlstedt, M.D.  Electronically Signed     SMD/MEDQ  D:  12/09/2007  T:  12/10/2007  Job:  [id number]

## 2011-03-20 NOTE — Op Note (Signed)
NAME:  [name], Carla                   ACCOUNT NO.:  [id number]   MEDICAL RECORD NO.:  [id number]          PATIENT TYPE:  AMB   LOCATION:  ENDO                         FACILITY:  [facility]   PHYSICIAN:  John C. Madilyn Fireman, M.D.    DATE OF BIRTH:  04/30/1976   DATE OF PROCEDURE:  12/09/2008  DATE OF DISCHARGE:                               OPERATIVE REPORT   INDICATIONS FOR PROCEDURE:  Epigastric abdominal pain and weight loss.   PROCEDURE:  The patient was placed in the left lateral decubitus  position and placed on the pulse monitor with continuous low-flow oxygen  delivered by nasal cannula.  She was sedated with 100 mcg IV fentanyl  and 8 mg IV Versed and 12.5 mg IV Benadryl.  The Olympus video endoscope  was advanced under direct vision into the oropharynx and esophagus.  The  esophagus was straight and of normal caliber with the squamocolumnar  line at 38 cm.  There was no visible hiatal hernia, ring stricture, or  other abnormality of the GE junction.  The stomach was entered and small  amount of liquid secretions were suctioned from the fundus.  Retroflexed  view of the cardia was unremarkable.  The fundus, body, antrum, and  pylorus all appeared normal.  The duodenum was entered and both the bulb  and second portion were well inspected and appeared to be within normal  limits.  The biopsies were taken from the distal duodenum to rule out  celiac disease.  The scope was then withdrawn and the patient returned  to the recovery room in stable condition.  She tolerated the procedure  well, and there were no immediate complications.   IMPRESSION:  Basically normal endoscopy.   PLAN:  Await biopsy results.           ______________________________  Everardo All Madilyn Fireman, M.D.     JCH/MEDQ  D:  12/09/2008  T:  12/09/2008  Job:  [id number]   cc:   Titus Dubin. Alwyn Ren, MD,FACP,FCCP

## 2011-03-20 NOTE — Op Note (Signed)
NAME:  [name], Carla                   ACCOUNT NO.:  [id number]   MEDICAL RECORD NO.:  [id number]          PATIENT TYPE:  AMB   LOCATION:  DAY                          FACILITY:  [facility]   PHYSICIAN:  Excell Seltzer. Annabell Howells, M.D.    DATE OF BIRTH:  September 21, 1976   DATE OF PROCEDURE:  01/05/2008  DATE OF DISCHARGE:                               OPERATIVE REPORT   PROCEDURE:  Cystoscopy right retrograde pyelogram with interpretation,  right ureteroscopic stone extraction.  Insertion of right double-J  stent.   PREOPERATIVE DIAGNOSIS:  Right ureterovesical junction stone.   POSTOPERATIVE DIAGNOSIS:  Right ureterovesical junction stone.   SURGEON:  Dr. Bjorn Pippin.   ANESTHESIA:  General.   SPECIMEN:  Right ureteral stone.   COMPLICATIONS:  None.   DRAINS:  4.8 x 24 cm right double-J stent.   INDICATIONS:  [name] is a 35 year old white female with recurrent  urolithiasis who had onset Friday of severe right flank pain.  CT in the  ER revealed a 2 mm right distal ureteral stone.  She had had a similar  stone extracted in early February.  After discussing options, we elected  to go with ureteroscopy since she tends to have problems passing stones.  A KUB in the office suggested a 2 mm stone in the upper pelvis, however  this was not consistent with a calcification seen on CT.   FINDINGS AND PROCEDURE:  The patient was given Cipro.  She was taken to  the operating room where general anesthetic was induced.  She was placed  in lithotomy position.  Her perineum and genitalia were prepped with  Betadine solution.  She was draped in the usual sterile fashion.  Cystoscopy was performed using 22-French scope and 12 degrees lens.  Examination revealed a normal urethra.  The bladder wall was smooth and  pale without tumor, stones or inflammation.  Ureteral orifices were  unremarkable.   The right ureteral orifice was cannulated with 5-French open-end  catheter.  Contrast was instilled in retrograde  fashion.   The retrograde demonstrated minimal ureteral dilation with a very faint  filling defect in the distal ureter.   The 6-French short ureteroscope was then passed per urethra and advanced  up the ureter without need for dilation.  The stone was identified the  distal ureter.  This was grasped with a nitinol basket and removed  without difficulty.  The ureteroscope was then advanced to the level of  the mid ureter to insure no other stones were noted.  None were seen.  A  guidewire was then passed through the ureteroscope to the kidney under  fluoroscopic guidance.   The ureteroscope was removed leaving the wire in place and a 4.8 x 24 cm  double-J stent was passed without difficulty to the kidney under  fluoroscopic guidance.  The wire was removed leaving a good coil in the  kidney, a good coil in the bladder.  This position was confirmed by  ureteroscopy.  The bladder was then drained.  A B&O suppository was  placed.  She was taken down from  lithotomy position.  Her anesthetic was  reversed and she was removed to the recovery room in stable condition.  There no complications.      Excell Seltzer. Annabell Howells, M.D.  Electronically Signed     JJW/MEDQ  D:  01/05/2008  T:  01/06/2008  Job:  [id number]

## 2011-03-20 NOTE — Op Note (Signed)
NAME:  [name], [name]                   ACCOUNT NO.:  [id number]   MEDICAL RECORD NO.:  [id number]          PATIENT TYPE:  AMB   LOCATION:                                FACILITY:  [facility]   PHYSICIAN:  Zenaida Niece, M.D.DATE OF BIRTH:  06/20/1976   DATE OF PROCEDURE:  03/14/2007  DATE OF DISCHARGE:                               OPERATIVE REPORT   PREOPERATIVE DIAGNOSIS:  Left ectopic pregnancy.   POSTOPERATIVE DIAGNOSIS:  Left ectopic pregnancy.   PROCEDURE:  Laparoscopy with attempted left salpingostomy followed by  left salpingectomy.   SURGEON:  Zenaida Niece, M.D.   ASSISTANT:  None.   ANESTHESIA:  General endotracheal tube.   SPECIMENS:  Distal left fallopian tube to pathology.   ESTIMATED BLOOD LOSS:  200 mL.   COMPLICATIONS:  None.   FINDINGS:  She had a normal upper abdomen, normal uterus, normal right  tube and ovary.  There was approximately 100 mL of blood in the pelvis  upon entering the abdomen.  There was bleeding coming from the distal  end of the left fallopian tube.  The left fallopian tube was dilated,  the distal 2/3, consistent with an ectopic pregnancy.   PROCEDURE IN DETAIL:  The patient was taken to the operating room and  placed in the dorsal supine position.  General anesthesia was induced  and she was placed in mobile stirrups.  The abdomen was then prepped and  draped in the usual sterile fashion, the bladder drained with a latex  free catheter, Hulka tenaculum applied to the cervix for uterine  manipulation.  Infraumbilical skin was then infiltrated with 0.25%  Marcaine and a 1 cm horizontal incision was made in her previous scar.  The Veress needle was inserted into the peritoneal cavity and placement  confirmed by the water drop test at an opening pressure of 6 mmHg.  CO2  gas was insufflated to a pressure of 14 mmHg and the Veress needle was  removed.  A size 10/11 disposable trocar was then introduced with direct  visualization with the  laparoscope without complications.  A 5 mm port  was then placed on the left side also under direct visualization.  Inspection revealed the above mentioned findings.  Another 5 mm port was  placed low in the midline under direct visualization.   The pelvis was copiously irrigated and all clots and debris were  removed.  The obvious ectopic pregnancy was on the left side.  There was  active bleeding coming from the distal end of the left tube.  I  attempted to milk the left tube to remove the pregnancy from the tube  but this would not abort through the end of the tube.  I then used the  gyrus laparoscopic needle to make an incision right over the most  bulging part of this tube.  I was then able to, using pressure and with  a grasper, remove the ectopic pregnancy from the distal portion of the  fallopian tube.  This removed probably most if not all of the ectopic.  However, the  tube would not stop bleeding.  I attempted to place an  Endoloop around this but was unable to do this.  Thus bipolar cautery  was used to coagulate just proximal to the portion of the tube that was  bleeding and this was coagulated and then cut to remove the distal  portion of the left fallopian tube.  This was then removed through the  umbilical trocar while visualizing this with a 5 mm scope.   All trocars were then removed after gas was allowed to deflate from the  abdomen.  Prior to doing this, the pelvis was irrigated again and all  pedicles inspected and found to be hemostatic.  Skin incisions were  closed with interrupted subcuticular sutures of 4-0 Vicryl followed by  Dermabond.  The Hulka tenaculum was then removed.  The patient tolerated  the procedure well.  She was extubated in the operating room and taken  to the recovery room in stable condition.      Zenaida Niece, M.D.  Electronically Signed     TDM/MEDQ  D:  03/14/2007  T:  03/14/2007  Job:  [id number]

## 2011-03-20 NOTE — Op Note (Signed)
NAME:  [name], [name]                   ACCOUNT NO.:  [id number]   MEDICAL RECORD NO.:  [id number]          PATIENT TYPE:  AMB   LOCATION:  NESC                         FACILITY:  [facility]   PHYSICIAN:  Excell Seltzer. Annabell Howells, M.D.    DATE OF BIRTH:  [birth year]   DATE OF PROCEDURE:  DATE OF DISCHARGE:                               OPERATIVE REPORT   PROCEDURE:  Cystoscopy, left ureteroscopic stone extraction.   PREOPERATIVE DIAGNOSIS:  Left ureterovesical junction stone.   POSTOPERATIVE DIAGNOSIS:  Left ureterovesical junction stone.   ANESTHESIA:  General.   SPECIMEN:  Left ureteral stone.   COMPLICATIONS:  None.   INDICATIONS:  [name] is a 35 year old white female with recurrent  urolithiasis.  She has a symptomatic left distal stone that has not  passed over several weeks of observation and measured 2 mm on recent  scan.   FINDINGS AND PROCEDURE:  The patient was given Cipro.  She was taken to  the operating room where general anesthetic was induced.  She was placed  in lithotomy position.  Her perineum and genitalia were prepped with  Betadine solution.  She was draped in the usual sterile fashion.   Cystoscopy was performed using a 22-French scope and 12-degree lens.  Examination revealed a normal urethra.  The bladder wall was smooth and  pale without tumor, stones, or inflammation.  Ureteral orifices were  unremarkable.   A guidewire was passed to the kidney under fluoroscopic guidance.  The  stone was noted in the pelvis.  The wire passed the stone.  A 6-French  short ureteroscope was then advanced alongside the wire without  dilation.  The stone was visualized, grasped with a nitinol basket, and  removed without difficulty.  Re-inspection after removal the stone  revealed only a small amount of retained dust, nothing that could be  retrieved.  There was minimal ureteral trauma or edema, and it was not  felt that stenting was indicated.   The guidewire was removed.  The  bladder was drained.  A B & O  suppository was placed.  The patient was given 30 mg of Toradol IV.  She  was taken down from the lithotomy position.  Her anesthetic was  reversed.  She was moved to the recovery room in stable condition.  There were no complications.      Excell Seltzer. Annabell Howells, M.D.  Electronically Signed    JJW/MEDQ  D:  04/19/2009  T:  04/19/2009  Job:  [id number]

## 2011-03-20 NOTE — Assessment & Plan Note (Signed)
[facility]                            CARDIOLOGY OFFICE NOTE   NAME:ROGAL, Prerana                            MRN:          [id number]  DATE:05/29/2007                            DOB:          1976-02-09    [name] returns today for followup.  She continues to have palpitations.  She did not get an event monitor.  They continue to appear benign.  She  had a stress echocardiogram which was normal, with no wall motion  abnormalities, no arrhythmias.   We talked at length about possibly changing her to Toprol-XL 50 a day as  a long-acting beta-blocker instead of short-acting propranolol.  I think  this will also help with her migraines.  She just started Topamax a  couple of weeks ago for this.   She seems to be improving.   From a cardiac perspective, her review of systems is otherwise negative.   CURRENT MEDICATIONS:  1. Topamax 25 b.i.d.  2. Propranolol to be stopped to start Toprol-XL 50 mg a day.  3. Prenatal vitamins.  4. Celexa.   She is trying to get pregnant.  I talked to her about probably needing  to stop her Topamax and Celexa when she is pregnant.  We will have to  see exactly what beta-blocker she will tolerate, but I suspect she will  need to be off beta-blockers for the second and third trimester.   ALLERGIES:  SULFA.   PHYSICAL EXAMINATION:  GENERAL:  Healthy-appearing young white female in  no distress.  Affect is appropriate.  VITAL SIGNS:  Blood pressure is 113/83, pulse 94, weight is 128,  respiratory rate 12, afebrile.  HEENT:  Normal.  NECK:  Carotids normal without bruits.  There is no lymphadenopathy, no  thyromegaly, no JVP elevation.  LUNGS:  Clear.  Good diaphragmatic motion.  No wheezing.  CARDIAC:  Normal S1 and S2 with normal heart sounds.  PMI is normal.  ABDOMEN:  Benign.  There is no tenderness.  Bowel sounds are positive.  No AAA, no hepatosplenomegaly or hepatojugular reflux.  EXTREMITIES:  Femorals are +4  bilaterally.  PTs are +4 bilaterally.  There is no lower extremity edema.  NEUROLOGIC:  Nonfocal.  There is no muscular weakness.   IMPRESSION:  1. Benign palpitations, currently improved on beta-blocker.  Switched      from long-acting to short-acting.  Followup in 6 months.  2. Migraines.  Continue Topamax 25 b.i.d.  Follow up with neurologist.  3. Anxiety/depression.  Continue Celexa 80 mg a day.  Again, I talked      to her about her pregnancy, and she would have to probably get off      of some of these medicines, particular Topamax and Celexa once she      becomes pregnant.  We will work on what beta-blocker she can      tolerate and probably have her off all beta-blockers during the      second and third trimester.     [name], MD, [facility]  Electronically Signed    PCN/MedQ  DD: 05/29/2007  DT: 05/30/2007  Job #: [id number]

## 2011-03-20 NOTE — Assessment & Plan Note (Signed)
[facility]                            CARDIOLOGY OFFICE NOTE   NAME:[name], [name]                          MRN:          [id number]  DATE:07/05/2008                            DOB:          Nov 04, 1976    A 35 year old patient who I have seen for palpitations.  She continues  to get them about once a week.  Her stress echo was normal without  structural heart disease.  She continues to have issues with anxiety.   I told her since she is continuing to have symptoms, I think it would be  worthwhile to get an event monitor for 6-8 weeks and reassess.  Since I  last saw her, she is off her Topamax and does not need to take any  propranolol.  She takes multivitamins.   REVIEW OF SYSTEMS:  Otherwise negative.   PHYSICAL EXAMINATION:  GENERAL:  Remarkable for young white female, in  no distress.  VITAL SIGNS:  Weight 126, blood pressure is 126/87, pulse 81 and  regular, respiratory rate 14, afebrile.  HEENT:  Unremarkable.  NECK:  Carotids are without bruit.  No lymphadenopathy, thyromegaly, or  JVP elevation.  LUNGS:  Clear with good diaphragmatic motion.  No wheezing.  S1 and S2.  Normal heart sounds.  PMI normal.  ABDOMEN:  Benign.  Bowel sounds positive.  No AAA.  No tenderness.  No  bruit.  No hepatosplenomegaly or hepatojugular reflux.  No tenderness.  EXTREMITIES:  Distal pulses are intact with no edema.  NEURO:  Nonfocal.  SKIN:  Warm and dry.  MUSCULOSKELETAL:  No muscular weakness.   EKG is normal.   IMPRESSION:  1. Palpitations, likely benign.  Check event recorder, p.r.n. Inderal.  2. Anxiety, taking Xanax 0.5 mg a day.  Follow up primary care MD.  I      will see her back in 6-8 weeks to go over her monitor.     [name], MD, [facility]  Electronically Signed    PCN/MedQ  DD: 07/05/2008  DT: 07/06/2008  Job #: [id number]

## 2011-03-20 NOTE — Op Note (Signed)
NAME:  [name], [name]                   ACCOUNT NO.:  [id number]   MEDICAL RECORD NO.:  [id number]          PATIENT TYPE:  AMB   LOCATION:                                FACILITY:  [facility]   PHYSICIAN:  Zenaida Niece, M.D.DATE OF BIRTH:  December 28, 1975   DATE OF PROCEDURE:  03/14/2007  DATE OF DISCHARGE:                               OPERATIVE REPORT   PREOPERATIVE DIAGNOSIS:  Left ectopic pregnancy.   POSTOPERATIVE DIAGNOSIS:  Left ectopic pregnancy.   PROCEDURE:  Laparoscopy with attempted left salpingostomy followed by  left salpingectomy.   SURGEON:  Zenaida Niece, M.D.   ASSISTANT:  None.   ANESTHESIA:  General endotracheal tube.   SPECIMENS:  Distal left fallopian tube to pathology.   ESTIMATED BLOOD LOSS:  200 mL.   COMPLICATIONS:  None.   FINDINGS:  She had a normal upper abdomen, normal uterus, normal right  tube and ovary.  There was approximately 100 mL of blood in the pelvis  upon entering the abdomen.  There was bleeding coming from the distal  end of the left fallopian tube.  The left fallopian tube was dilated,  the distal 2/3, consistent with an ectopic pregnancy.   PROCEDURE IN DETAIL:  The patient was taken to the operating room and  placed in the dorsal supine position.  General anesthesia was induced  and she was placed in mobile stirrups.  The abdomen was then prepped and  draped in the usual sterile fashion, the bladder drained with a latex  free catheter, Hulka tenaculum applied to the cervix for uterine  manipulation.  Infraumbilical skin was then infiltrated with 0.25%  Marcaine and a 1 cm horizontal incision was made in her previous scar.  The Veress needle was inserted into the peritoneal cavity and placement  confirmed by the water drop test at an opening pressure of 6 mmHg.  CO2  gas was insufflated to a pressure of 14 mmHg and the Veress needle was  removed.  A size 10/11 disposable trocar was then introduced with direct  visualization with the  laparoscope without complications.  A 5 mm port  was then placed on the left side also under direct visualization.  Inspection revealed the above mentioned findings.  Another 5 mm port was  placed low in the midline under direct visualization.   The pelvis was copiously irrigated and all clots and debris were  removed.  The obvious ectopic pregnancy was on the left side.  There was  active bleeding coming from the distal end of the left tube.  I  attempted to milk the left tube to remove the pregnancy from the tube  but this would not abort through the end of the tube.  I then used the  gyrus laparoscopic needle to make an incision right over the most  bulging part of this tube.  I was then able to, using pressure and with  a grasper, remove the ectopic pregnancy from the distal portion of the  fallopian tube.  This removed probably most if not all of the ectopic.  However, the  tube would not stop bleeding.  I attempted to place an  Endoloop around this but was unable to do this.  Thus bipolar cautery  was used to coagulate just proximal to the portion of the tube that was  bleeding and this was coagulated and then cut to remove the distal  portion of the left fallopian tube.  This was then removed through the  umbilical trocar while visualizing this with a 5 mm scope.   All trocars were then removed after gas was allowed to deflate from the  abdomen.  Prior to doing this, the pelvis was irrigated again and all  pedicles inspected and found to be hemostatic.  Skin incisions were  closed with interrupted subcuticular sutures of 4-0 Vicryl followed by  Dermabond.  The Hulka tenaculum was then removed.  The patient tolerated  the procedure well.  She was extubated in the operating room and taken  to the recovery room in stable condition.      Zenaida Niece, M.D.     TDM/MEDQ  D:  03/14/2007  T:  03/14/2007  Job:  [id number]

## 2011-03-23 NOTE — Consult Note (Signed)
[name], [name]                         ACCOUNT NO.:  [id number]   MEDICAL RECORD NO.:  [id number]                   PATIENT TYPE:  EMS   LOCATION:  ED                                   FACILITY:  [facility]   PHYSICIAN:  Maretta Bees. Vonita Moss, M.D.             DATE OF BIRTH:  [birth year]   DATE OF CONSULTATION:  DATE OF DISCHARGE:  06/09/2002                               UROLOGY CONSULTATION   CHIEF COMPLAINT:  Left-sided abdominal pain.   HISTORY OF PRESENT ILLNESS:  The patient is a 35 year old white female with  an extensive history of nephrolithiasis.  The patient states that she has  had approximately seven episodes of kidney stones.  She has never acquired  surgical intervention for this.  She has been seen by Dr. Bjorn Pippin in the  past.  The patient states that she awoke this morning at approximately 5  a.m. with severe left-sided flank and left lower quadrant pain.  After the  pain did not subside, she came to the emergency department.  The patient  denies any dysuria, urgency, frequency, sense of incomplete emptying.  She  states that she has not urinated very much over the last 24 hours or so.  She denies any hematuria, history of GU malignancy.  She states that she has  had multiple episodes of cystitis as well as pyelonephritis.  The patient  denies any fevers or chills.  She has had some nausea, although no vomiting.   PAST MEDICAL HISTORY:  Nephrolithiasis.   PAST SURGICAL HISTORY:  None.   MEDICATIONS:  None.   ALLERGIES:  SULFA.   FAMILY HISTORY:  The patient has had a grandfather who has had kidney  stones.   SOCIAL HISTORY:  She denies any tobacco or alcohol use.   REVIEW OF SYSTEMS:  CONSTITUTIONAL:  The patient denies any fevers, chills,  recent weight loss. CARDIOVASCULAR:  She denies any palpitations or chest  pain.  PULMONARY:  She denies any shortness of breath, cough.  GASTROINTESTINAL:  She denies any vomiting, constipation, diarrhea,  hematemesis, melena.  She has had severe nausea with her recent pain.   PHYSICAL EXAMINATION:  VITAL SIGNS:  Temperature 98.8, heart rate 84,  respirations 18, blood pressure 124/72.  CONSTITUTIONAL:  The patient is a well-developed, well-nourished white  female who is alert and oriented and in no acute distress.  HEENT:  Normocephalic, atraumatic.  Extraocular movements were intact.  NECK:  Supple.  CARDIOVASCULAR:  Regular rate and rhythm without obvious murmurs.  LUNGS:  Clear bilaterally.  ABDOMEN:  Soft, nondistended.  There is presently no CVA tenderness.  The  patient has some mild left lower quadrant pain on palpation.  EXTREMITIES:  No cyanosis, clubbing, or edema.   LABORATORY DATA:  Urinalysis was performed which demonstrated 11 to 20 red  blood cell per high powered field, and 0 to 2 white blood cells.  There was  also  calcium oxylate crystals noted.   Radiologic studies:  A CT scan of the abdomen and pelvis was performed  without contrast.  This revealed a 3.5 mm left ureterovesical junction  calculus with moderate hydronephrosis.  In addition, there were bilateral  renal calculi in non-obstructing positions.   IMPRESSION:  Left ureterovesical junction calculus.   PLAN:  As the patient is afebrile and her pain is currently under control,  we will plan to allow the patient to be discharged home with plans to follow  up in the urology office with Dr. Annabell Howells.  She has been instructed to return  to the emergency department or to call the urology office should she begin  having fever, persistent nausea and vomiting, or uncontrolled pain.  She has  been given a prescription for pain medication today.  All of this has been  discussed with Dr. Larey Dresser.  The patient will plan to call Dr.  Belva Crome office for an appointment.      Crecencio Mc, MD                            Maretta Bees. Vonita Moss, M.D.    LB/MEDQ  D:  06/09/2002  T:  06/14/2002  Job:  [id number]   cc:   Excell Seltzer.  Annabell Howells, M.D.   Maretta Bees. Vonita Moss, M.D.

## 2011-03-23 NOTE — Assessment & Plan Note (Signed)
[facility]                            CARDIOLOGY OFFICE NOTE   NAME:[name], Carla                            MRN:          [id number]  DATE:02/03/2007                            DOB:          06/01/1976    [name] is referred today by the [facility].  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.  She was in the ER Saturday night/Sunday  morning for what appeared to be a panic attack.  She was referred for  chest pain and sinus tachycardia.   The patient's workup in the ER was negative.  The patient has had long-  standing panic attacks.  She apparently sees Dr. Evelene [name].  She as far as I  can tell is not on medication at this time.   The patient noted some atypical chest pain.  It was central, radiated  above shoulders.  Even in the room she felt like she had pressure in her  chest.  She seemed a little bit anxious.  Her heart rate at the time was  only 80.  The patient has limited coronary risk factors.  She is a  nonsmoker.  She denies drug use.  There is no history of  hypercholesterolemia, no history of hypertension, and no family history  of coronary disease.   Her biggest complaint aside from the isolated episode of chest pain, is  that she feels her pulse is racing.  She does take beta blockers for  this and has been on them for about three years.   REVIEW OF SYSTEMS:  Her review of systems is otherwise negative.  She  has been seen in January for walking pneumonia.   ALLERGIES:  The patient is allergic to SULFA.   She has not had any previous surgery.   Family history negative for premature CAD   MEDICATIONS:  1. She is on long-acting Lopressor 75 a day.  2. Citalopram 40 mg tablets, 1-1/2 a day.  3. Xanax up to four times a day 1 mg tabs.   PHYSICAL EXAMINATION:  VITAL SIGNS:  Blood pressure 120/80, pulse is 80-  90 with sinus arrhythmia.  HEENT:  Normal.  NECK:  There is no thyromegaly.  Apparently her thyroid has been  checked  recently and is normal.  There are no carotid bruits.  LUNGS:  Clear.  Marland Kitchen  HEART:  Normal heart sounds.  ABDOMEN:  Benign.  EXTREMITIES:  Lower extremities have intact pulses.  No edema.   LABORATORY DATA:  Her EKG is normal except for sinus arrhythmia.   IMPRESSION:  Atypical chest pain and likely panic attacks.  The patient  knows she can take an extra Lopressor if she were to feel anxiety or  palpitations.  She will have a follow-up stress echo to rule out  structural heart disease.   I offered her to have an event monitor should her symptoms recur.  It is  possible that she could have an occasional PSVT and it is not being  detected.  She will let us know if her symptoms  recur and the office  will arrange for her to have an event monitor so long as her stress echo  is normal.  She will follow up with her primary care M.D.  I encouraged  her to follow up with Dr. Evelene [name] for anxiety disorder.     [name]. Eden Emms, MD, [facility]  Electronically Signed    PCN/MedQ  DD: 02/03/2007  DT: 02/04/2007  Job #: [id number]

## 2011-03-23 NOTE — Discharge Summary (Signed)
NAMEELIOT, [name]                   ACCOUNT NO.:  [id number]   MEDICAL RECORD NO.:  [id number]          PATIENT TYPE:  IPS   LOCATION:  0301                          [facility]ILITY[facility]  [facility]   PHYSICIAN:  Jasmine Pang, M.D. DATE OF BIRTH:  [birth year]   DATE OF ADMISSION:  12/06/2006  DATE OF DISCHARGE:  12/08/2006                               DISCHARGE SUMMARY   IDENTIFICATION:  This is a voluntary admission on December 08, 2006, for  this 35 year old married white female.   HISTORY OF PRESENT ILLNESS:  The patient presented to the emergency  department at [facility] last evening around 9:30. She  presented with chest pain, noncardiac, from congestion, cold symptoms,  cough, crying, and having weakness. She reported that she had been  treated for atypical pneumonia, bronchitis and asthma for the past three  weeks.  She was feeling worse.  She was also fatigued and anxious.  She  was on day four of a Z-Pak. She had already completed a five day course  of Avelox and was also on prednisone 10 mg p.o. t.i.d. She stated she  felt dizzy and jittery.  She had no energy and was noted to have sinus  tachycardia.  When it was established that she could be released, she  stated she felt too bad. She indicated she was worried about going home  feeling this bad and was asking to be admitted to build up her strength.  She was thinking she was going to be admitted to the regular hospital  for some hydration and rest.  Instead it was felt she would benefit more  by coming to the [facility] because of her severe anxiety.  She stated she was very angry at her husband, at this point. She had she  stated some homicidal ideation towards him and some passive suicidality.  She stated I would not care if I went over the rail of a bridge.  Hearing these comments made the emergency room concerned and want to  send her to [place].  Basically, the patient is newly married since last May.  The  couple had dated for 3 to 3 1/2 years prior to marrying. The patient  was on Clomid for fertility hoping to get pregnant but the husband  decided he was not ready for a baby so he asked her to stop taking the  fertility drug.  He did not get her a Christmas present, although they  agreed to be on a budget and had indicated to one another what they were  hoping to receive.  She is the patient got him a Immunologist.  She has  also been told, in fact, that he had a vasectomy and she states that she  is concerned he would let her take her fertility drugs in hopes of  getting pregnant when there was not any possibility of that. Apparently  he has not admitted to having a vasectomy but will not go for fertility  testing himself. The patient is an outpatient of Dr. Carie Caddy since 2004.  The patient was  divorced from her first marriage at that time. She has  had some panic attacks and Dr. Evelene Croon has given her Celexa 60 mg daily and  occasional Xanax to help avoid a panic attack.  She denies any use of  drugs or alcohol.   PHYSICAL FINDINGS:  The patient was medically cleared in the ED.  She  had no remarkable physical findings at the time. She was in no acute  physical distress.   ADMISSION LABORATORIES:  These were done in the ED and reviewed by the  ED physician.   HOSPITAL COURSE:  Upon admission, the patient was started on Celexa 60  mg p.o. q.h.s. and Xanax 0.5 mg p.o. t.i.d. p.r.n., Asmanex MDI 1-2  puffs inhaler q.4-6h. p.r.n., prednisone 10 mg p.o. t.i.d., Z-Pak, which  she was to start as the fourth day of the pack.  The patient was also  started on Ambien 10 mg p.o. q.h.s. p.r.n.  On December 07, 2006, the  prednisone was stopped due to concern that this was affecting her mental  status. She was also started on a Flovent inhaler 44 mcg 2 puffs q.a.m.  and q.h.s. The patient was very cooperative.  She discussed her marital  issues.  Her mood was depressed.  Affect sad and tearful.  She  states  she had been on the prednisone for asthma and bronchitis that she had,  this has worsened her condition.  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  She was surprised when she went to the ED for hydration that  she was sent here.   On December 08, 2006, the patient's mental status improved.  She was  friendly and cooperative with good eye contact.  Speech normal rate and  flow.  Psychomotor activity within normal limits.  Mood less depressed  and anxious than upon admission. Her affect wider range.  No suicidal or  homicidal ideation.  No thoughts of self injurious behavior.  No  auditory or visual hallucinations.  No paranoia or delusions.  Thoughts  were logical and goal directed.  Thought content no predominant theme.  The cognitive was grossly back to  baseline.  The patient had talked  with her husband and they had decided to work harder on their marriage.  She stated he felt bad for the things he had done and she was encouraged  that the marriage could improve.  She she was felt safe to go home and  wanted to leave the hospital as quickly as possible.   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                    Anxiety disorder not otherwise specified (rule out  panic disorder).  AXIS II:          None.  AXIS III:         Still under treatment for atypical pneumonia,  bronchitis and recent kidney stone.  AXIS IV:          Problems with primary support group.  AXIS V:           GAF upon discharge was 50, GAF on admission 40, GAF  highest past year 70-75.   DISCHARGE/PLAN:  There were no specific activity level or dietary  restrictions.  The patient will return to see Dr. Milagros Evener for  follow-up treatment.  POST HOSPITAL CARE PLANS:  Celexa 60 mg daily at bedtime, Flovent 2  puffs in the a.m. and bedtime, alprazolam 0.5 mg three times daily as  needed, Ambien 10 mg at bedtime if  needed for sleep.      Jasmine Pang, M.D.  Electronically Signed     BHS/MEDQ  D:  01/30/2007  T:  01/30/2007  Job:  [id number]

## 2011-03-23 NOTE — H&P (Signed)
[name], [name]                   ACCOUNT NO.:  [id number]   MEDICAL RECORD NO.:  [id number]          PATIENT TYPE:  IPS   LOCATION:  0301                          [facility]   PHYSICIAN:  Carla Jungling, MD  DATE OF BIRTH:  1976-10-28   DATE OF ADMISSION:  12/06/2006  DATE OF DISCHARGE:                       PSYCHIATRIC ADMISSION ASSESSMENT   IDENTIFYING INFORMATION:  This is a voluntary admission to the services  of Dr. Marzetta Board.  This is a 35 year old married white female.  The  patient presented to the emergency department at [facility]  last evening around 9:30.  She presented with chest pain, noncardiac,  from congestion, cold symptoms, cough, crying and having weakness.  She  reported that she had been being treated for atypical pneumonia,  bronchitis and asthma for the past three weeks.  She was feeling worse.  She was fatigued and anxious.  She was on day #4 of Z-Pak.  She had  already completed a five-day course of Avelox and was also on prednisone  10 mg t.i.d.  She stated she felt dizzy and jittery.  She had no energy  and she was noted to have sinus tachycardia.  When it was established  that she could be released, she stated she felt too bad, she was worried  about going home feeling this bad and she was asking to be admitted to  build up her strength.  As she is a CT tech, she was thinking that she  was going to be admitted to have hydration and to get some rest.  Instead, it was felt that she would benefit more by coming to the  [facility].  The patient was anxious.  This was due to the  prednisone.  She stated she was very angry at her husband.  She stated,  this is not me.  She stated that she had had some homicidal ideation  towards her husband and some passive suicidality.  She stated I  wouldn't care if I went over the rail of a bridge.  Basically, the  patient is newly married since last May.  They had dated 3-3-1/2 years  prior to marrying.  The patient was on Clomid for fertility, hoping to  get pregnant, but the husband decided that he was not ready for a baby,  so he asked her to stop taking fertility drugs.  He did not get her a  Christmas present although they had agreed to a budget and had indicated  to one another what they were hoping to receive.  The patient got him a  Programmer, systems.  She has also been told that, in fact, he had a vasectomy  and she states that she is very concerned that he would let her take  fertility drugs in hoping to get pregnant when there would not be any  possibility of that.  Apparently, he has not admitted to having had a  vasectomy but will not go for fertility testing on himself.   PAST PSYCHIATRIC HISTORY:  She has been an outpatient of Dr. Evelene Croon since  2004.  The patient was divorced from her first marriage at that time.  She has had some panic attacks and Dr. Evelene Croon has given her Celexa with  occasional Xanax to help avoid a panic attack.   SOCIAL HISTORY:  She has an associates degree.  She has been married x2.  This time since last May.  She has no children.  She is currently a  [employer], working in [employer].   FAMILY HISTORY:  Her mom has depression.   ALCOHOL/DRUG HISTORY:  She denies.   PRIMARY CARE PHYSICIAN:  Primary care [place] is Dr. Marga Melnick.  Her psychiatrist is Dr. Milagros Evener.   MEDICAL HISTORY:  In the past month, she has had bronchitis, pneumonia,  a kidney stone and atypical pneumonia.   MEDICATIONS:  She is currently prescribed Celexa 60 mg p.o. q.h.s.,  Xanax 0.5 mg t.i.d. p.r.n., Asmanex multidose inhaler 1-2 puffs q.4-6h.  p.r.n., prednisone p.o. t.i.d. and Z-Pak, she was on her fourth day as  ordered and that should be finishing today.   PHYSICAL EXAMINATION:  She was medically cleared in the ED.  She had no  remarkable physical findings at this time.  Her vital signs on admission  are height is 63 inches, weight 154 pounds,  temperature 98.7, blood  pressure 144/96 to 142/90, her pulse was 113 to 109.   MENTAL STATUS EXAM:  She is alert and oriented.  She is casually groomed  and dressed.  She appears to be adequately nourished.  Her speech is not  pressured.  Her mood is depressed.  Her affect is labile.  She cries  frequently and easily.  Her thought processes are clear, rational and  goal-oriented.  She wants to get off the prednisone.  Judgment and  insight are intact.  Concentration and memory are intact.  She denies  being suicidal or homicidal.  She denies auditory or visual  hallucinations.   DIAGNOSES:  AXIS I:  Major depressive disorder with no psychotic  features.  Anxiety disorder with panic attacks.  AXIS II:  Deferred.  AXIS III:  Still under treatment for atypical pneumonia, bronchitis,  recent kidney stone.  AXIS IV:  Problems with primary support group.  AXIS V:  40.   PLAN:  To admit for safety and stabilization.  Will just go ahead and  stop that p.o. prednisone and we will schedule a family session with her  husband to educate him about the emotional side effects of prednisone.  We will adjust her other medications as indicated but she stated that  the Celexa and the Xanax were doing fine prior to becoming ill.      [name], P.A.-C.      Carla Jungling, MD  Electronically Signed    MD/MEDQ  D:  12/07/2006  T:  12/07/2006  Job:  [id number]

## 2011-03-27 ENCOUNTER — Other Ambulatory Visit (HOSPITAL_COMMUNITY): Payer: Self-pay | Admitting: Obstetrics and Gynecology

## 2011-03-27 DIAGNOSIS — O3680X0 Pregnancy with inconclusive fetal viability, not applicable or unspecified: Secondary | ICD-10-CM

## 2011-04-23 DIAGNOSIS — M549 Dorsalgia, unspecified: Secondary | ICD-10-CM

## 2011-04-23 DIAGNOSIS — O26899 Other specified pregnancy related conditions, unspecified trimester: Secondary | ICD-10-CM

## 2011-06-16 ENCOUNTER — Inpatient Hospital Stay (HOSPITAL_COMMUNITY)
Admission: AD | Admit: 2011-06-16 | Discharge: 2011-06-17 | Disposition: A | Discharge status: Home / Self Care | Payer: 59 | Source: Ambulatory Visit | Attending: Obstetrics and Gynecology | Admitting: Obstetrics and Gynecology

## 2011-06-16 ENCOUNTER — Encounter (HOSPITAL_COMMUNITY): Payer: Self-pay | Admitting: *Deleted

## 2011-06-16 ENCOUNTER — Inpatient Hospital Stay (HOSPITAL_COMMUNITY): Payer: 59

## 2011-06-16 DIAGNOSIS — O99891 Other specified diseases and conditions complicating pregnancy: Secondary | ICD-10-CM | POA: Insufficient documentation

## 2011-06-16 DIAGNOSIS — R109 Unspecified abdominal pain: Secondary | ICD-10-CM | POA: Insufficient documentation

## 2011-06-16 DIAGNOSIS — M549 Dorsalgia, unspecified: Secondary | ICD-10-CM | POA: Insufficient documentation

## 2011-06-16 HISTORY — DX: Calculus of kidney: N20.0

## 2011-06-16 LAB — URINALYSIS, ROUTINE W REFLEX MICROSCOPIC
Bilirubin Urine: NEGATIVE
Glucose, UA: NEGATIVE mg/dL
Ketones, ur: NEGATIVE mg/dL
pH: 7 (ref 5.0–8.0)

## 2011-06-16 LAB — URINE MICROSCOPIC-ADD ON

## 2011-06-16 MED ORDER — LACTATED RINGERS IV SOLN: INTRAVENOUS | Status: DC

## 2011-06-16 MED ORDER — HYDROMORPHONE HCL 1 MG/ML IJ SOLN
1.0000 mg | Freq: Once | INTRAMUSCULAR | Status: AC
  Administered 2011-06-16: 1 mg via INTRAVENOUS
  Filled 2011-06-16: qty 1

## 2011-06-16 NOTE — Progress Notes (Signed)
Patient states that she has had history of kidney stones and thinks this is another one. She c/o left flank and groin pain. She denies any vaginal bleeding or lof. She has had n/v at 1400pm. She states that she took 1 tablet of percocet 10/325mg  and 1000mg  tylenol at 1900pm without relief.

## 2011-06-16 NOTE — Progress Notes (Signed)
Pt states, " I started having blood in my urine on August 2nd, and today I started having frequency. I have had forty three kidney stones, and this feels very similar. I took Percocet 10 mg and tylenol 1000 mg at 7 pm without relief."

## 2011-06-16 NOTE — ED Provider Notes (Signed)
History     Chief Complaint  Patient presents with  . Flank Pain   HPI  Hx of kidney stones.  Pain increased tonight around 2100 with little relief from percocet. Reports left flank pain and hematuria. +urinary frequency.    Past Medical History  Diagnosis Date  . Kidney stone     stent and lithotrisy 2009    Past Surgical History  Procedure Date  . Laparoscopy for ectopic pregnancy 2008    mtx also    Family History  Problem Relation Age of Onset  . Cancer Maternal Grandmother     History  Substance Use Topics  . Smoking status: Never Smoker   . Smokeless tobacco: Not on file  . Alcohol Use: No    Allergies:  Allergies  Allergen Reactions  . Prochlorperazine Edisylate Other (See Comments)    tachycardia  . Sulfa Antibiotics Hives and Itching    Prescriptions prior to admission  Medication Sig Dispense Refill  . acetaminophen (TYLENOL) 500 MG tablet Take 1,000 mg by mouth every 6 (six) hours as needed. For pain       . ondansetron (ZOFRAN) 8 MG tablet Take 8 mg by mouth every 8 (eight) hours as needed. As needed for nausea and vomiting       . prenatal vitamin w/FE, FA (PRENATAL 1 + 1) 27-1 MG TABS Take 1 tablet by mouth daily.          Review of Systems  Constitutional: Negative for fever and chills.  Gastrointestinal: Positive for abdominal pain.  Genitourinary: Positive for dysuria, frequency, hematuria and flank pain (right).   Physical Exam   Blood pressure 112/81, pulse 96, temperature 99 F (37.2 C), height 5' 3.5" (1.613 m), weight 61.009 kg (134 lb 8 oz).  Physical Exam  General:   alert, cooperative, appears older than stated age and uncomfortable  Skin:   normal  HEENT:  n/a  Lungs:   clear to auscultation bilaterally and normal percussion bilaterally  Heart:   regular rate and rhythm, S1, S2 normal, no murmur, click, rub or gallop  Breasts:   n/a  Abdomen:  soft, mild tenderness in LLQ, no masses palpated; left flank pain  Pelvis:   Cervix: closed  FHT: 140's BPM  Uterine Size: consistent w dates  Presentations: n/a  Cervix:    Dilation: Closed   Effacement: Long   Station:  Floating   Consistency: medium   Position: posterior    MAU Course  Procedures  IV Fluids Pain Meds [place] - bilateral jets, no hydronephrosis  Assessment and Plan  Flank Pain - Hx Kidney Stones DC home after consult with Dr. Ambrose Mantle and review of US/labs  [facility] 06/16/2011, 10:40 PM

## 2011-06-17 MED ORDER — SODIUM CHLORIDE 0.9 % IJ SOLN
INTRAMUSCULAR | Status: AC
  Filled 2011-06-17: qty 6

## 2011-06-17 MED ORDER — HYDROMORPHONE HCL 1 MG/ML IJ SOLN
1.0000 mg | Freq: Once | INTRAMUSCULAR | Status: AC
  Administered 2011-06-17: 1 mg via INTRAVENOUS
  Filled 2011-06-17: qty 1

## 2011-06-17 MED ORDER — OXYCODONE-ACETAMINOPHEN 5-325 MG PO TABS: 1.0000 | ORAL_TABLET | ORAL | Status: AC | PRN

## 2011-06-17 NOTE — Consult Note (Signed)
Reviewed [place] results>DC home with pain meds.

## 2011-06-17 NOTE — ED Provider Notes (Signed)
Pt reports improvement in pain after dilaudid IV>desires DC home.

## 2011-07-16 ENCOUNTER — Encounter (HOSPITAL_COMMUNITY): Payer: Self-pay | Admitting: *Deleted

## 2011-07-16 ENCOUNTER — Inpatient Hospital Stay (HOSPITAL_COMMUNITY): Payer: 59

## 2011-07-16 ENCOUNTER — Inpatient Hospital Stay (HOSPITAL_COMMUNITY)
Admission: AD | Admit: 2011-07-16 | Discharge: 2011-07-16 | Disposition: A | Discharge status: Home / Self Care | Payer: 59 | Source: Ambulatory Visit | Attending: Obstetrics and Gynecology | Admitting: Obstetrics and Gynecology

## 2011-07-16 DIAGNOSIS — N390 Urinary tract infection, site not specified: Secondary | ICD-10-CM | POA: Insufficient documentation

## 2011-07-16 DIAGNOSIS — O2390 Unspecified genitourinary tract infection in pregnancy, unspecified trimester: Secondary | ICD-10-CM | POA: Insufficient documentation

## 2011-07-16 MED ORDER — LACTATED RINGERS IV SOLN
INTRAVENOUS | Status: DC
  Administered 2011-07-16: 20:00:00 via INTRAVENOUS

## 2011-07-16 MED ORDER — MORPHINE SULFATE 4 MG/ML IJ SOLN
2.0000 mg | INTRAMUSCULAR | Status: DC | PRN
  Administered 2011-07-16: 2 mg via INTRAVENOUS
  Filled 2011-07-16: qty 1

## 2011-07-16 MED ORDER — OXYCODONE-ACETAMINOPHEN 5-325 MG PO TABS: 2.0000 | ORAL_TABLET | ORAL | Status: DC | PRN

## 2011-07-16 MED ORDER — ONDANSETRON HCL 4 MG/2ML IJ SOLN
4.0000 mg | Freq: Once | INTRAMUSCULAR | Status: AC
  Administered 2011-07-16: 4 mg via INTRAVENOUS
  Filled 2011-07-16: qty 2

## 2011-07-16 MED ORDER — HYDROMORPHONE HCL 1 MG/ML IJ SOLN
1.0000 mg | Freq: Once | INTRAMUSCULAR | Status: AC
  Administered 2011-07-16: 1 mg via INTRAVENOUS
  Filled 2011-07-16: qty 1

## 2011-07-16 MED ORDER — LACTATED RINGERS IV BOLUS (SEPSIS)
1000.0000 mL | Freq: Once | INTRAVENOUS | Status: AC
Start: 2011-07-16 — End: 2011-07-16
  Administered 2011-07-16: 1000 mL via INTRAVENOUS

## 2011-07-16 NOTE — Progress Notes (Signed)
R/o Kidney stone, has UTI, pain, sent from office

## 2011-07-26 LAB — POCT I-STAT CREATININE
Creatinine, Ser: 0.8
Operator id: 235561

## 2011-07-26 LAB — I-STAT 8, (EC8 V) (CONVERTED LAB)
Acid-Base Excess: 1
Chloride: 105
Hemoglobin: 15.3 — ABNORMAL HIGH
Potassium: 3.5
Sodium: 139
TCO2: 29

## 2011-07-27 LAB — PREGNANCY, URINE
Preg Test, Ur: NEGATIVE
Preg Test, Ur: NEGATIVE

## 2011-07-27 LAB — URINALYSIS, ROUTINE W REFLEX MICROSCOPIC
Bilirubin Urine: NEGATIVE
Ketones, ur: NEGATIVE
Nitrite: NEGATIVE
Protein, ur: NEGATIVE
Urobilinogen, UA: 0.2

## 2011-07-27 LAB — URINE CULTURE
Colony Count: NO GROWTH
Culture: NO GROWTH

## 2011-07-29 ENCOUNTER — Emergency Department (HOSPITAL_COMMUNITY)
Admission: EM | Admit: 2011-07-29 | Discharge: 2011-07-29 | Disposition: A | Discharge status: Home / Self Care | Payer: 59 | Attending: Emergency Medicine | Admitting: Emergency Medicine

## 2011-07-29 DIAGNOSIS — Z87442 Personal history of urinary calculi: Secondary | ICD-10-CM | POA: Insufficient documentation

## 2011-07-29 DIAGNOSIS — O219 Vomiting of pregnancy, unspecified: Secondary | ICD-10-CM | POA: Insufficient documentation

## 2011-07-29 DIAGNOSIS — O99891 Other specified diseases and conditions complicating pregnancy: Secondary | ICD-10-CM | POA: Insufficient documentation

## 2011-07-29 DIAGNOSIS — R109 Unspecified abdominal pain: Secondary | ICD-10-CM | POA: Insufficient documentation

## 2011-07-29 DIAGNOSIS — R319 Hematuria, unspecified: Secondary | ICD-10-CM | POA: Insufficient documentation

## 2011-07-29 LAB — URINE MICROSCOPIC-ADD ON

## 2011-07-29 LAB — URINALYSIS, ROUTINE W REFLEX MICROSCOPIC
Glucose, UA: NEGATIVE mg/dL
Leukocytes, UA: NEGATIVE
Protein, ur: NEGATIVE mg/dL
Specific Gravity, Urine: 1.007 (ref 1.005–1.030)
pH: 7 (ref 5.0–8.0)

## 2011-07-30 LAB — URINE MICROSCOPIC-ADD ON

## 2011-07-30 LAB — URINALYSIS, ROUTINE W REFLEX MICROSCOPIC
Bilirubin Urine: NEGATIVE
Ketones, ur: NEGATIVE
Ketones, ur: 15 — AB
Nitrite: NEGATIVE
Nitrite: POSITIVE — AB
Protein, ur: >300 — AB
pH: 6.5
pH: 6

## 2011-07-30 LAB — URINE CULTURE: Colony Count: >=100000

## 2011-07-30 LAB — PREGNANCY, URINE
Preg Test, Ur: NEGATIVE
Preg Test, Ur: NEGATIVE

## 2011-07-30 LAB — HEMOGLOBIN AND HEMATOCRIT, BLOOD: Hemoglobin: 12.2

## 2011-07-31 LAB — URINALYSIS, ROUTINE W REFLEX MICROSCOPIC
Ketones, ur: NEGATIVE
Leukocytes, UA: NEGATIVE
Nitrite: NEGATIVE
Protein, ur: NEGATIVE

## 2011-07-31 LAB — PREGNANCY, URINE: Preg Test, Ur: NEGATIVE

## 2011-08-02 LAB — URINE MICROSCOPIC-ADD ON

## 2011-08-02 LAB — URINE CULTURE

## 2011-08-02 LAB — POCT PREGNANCY, URINE: Preg Test, Ur: NEGATIVE

## 2011-08-02 LAB — URINALYSIS, ROUTINE W REFLEX MICROSCOPIC
Glucose, UA: NEGATIVE
Ketones, ur: NEGATIVE
Protein, ur: NEGATIVE

## 2011-08-06 LAB — URINALYSIS, ROUTINE W REFLEX MICROSCOPIC
Glucose, UA: NEGATIVE
Protein, ur: NEGATIVE
pH: 6

## 2011-08-06 LAB — DIFFERENTIAL
Basophils Absolute: 0
Basophils Relative: 0
Eosinophils Absolute: 0
Eosinophils Relative: 0
Monocytes Absolute: 0.4
Neutro Abs: 7.8 — ABNORMAL HIGH

## 2011-08-06 LAB — COMPREHENSIVE METABOLIC PANEL
ALT: 19
AST: 14
Albumin: 3.9
Alkaline Phosphatase: 52
BUN: 9
Chloride: 110
Potassium: 3.7
Sodium: 142
Total Bilirubin: 0.7

## 2011-08-06 LAB — CBC
HCT: 39.6
Platelets: 288
WBC: 9.4

## 2011-08-08 LAB — URINALYSIS, ROUTINE W REFLEX MICROSCOPIC
Bilirubin Urine: NEGATIVE
Glucose, UA: NEGATIVE
Ketones, ur: NEGATIVE
pH: 6

## 2011-08-08 LAB — POCT I-STAT, CHEM 8
HCT: 42
Hemoglobin: 14.3
Potassium: 3.6
Sodium: 139
TCO2: 25

## 2011-08-08 LAB — URINE MICROSCOPIC-ADD ON

## 2011-08-14 LAB — URINE MICROSCOPIC-ADD ON

## 2011-08-14 LAB — URINALYSIS, ROUTINE W REFLEX MICROSCOPIC
Bilirubin Urine: NEGATIVE
Nitrite: NEGATIVE
Protein, ur: NEGATIVE
Specific Gravity, Urine: 1.017
Urobilinogen, UA: 0.2

## 2011-08-17 LAB — BASIC METABOLIC PANEL
BUN: 12
Chloride: 105
GFR calc non Af Amer: >60
Potassium: 3.6
Sodium: 139

## 2011-08-17 LAB — URINALYSIS, ROUTINE W REFLEX MICROSCOPIC
Bilirubin Urine: NEGATIVE
Ketones, ur: NEGATIVE
Nitrite: NEGATIVE
Specific Gravity, Urine: 1.011
Urobilinogen, UA: 0.2

## 2011-08-17 LAB — CBC
HCT: 39.4
Hemoglobin: 13.4
MCV: 87
Platelets: 317
RBC: 4.53
WBC: 10.6 — ABNORMAL HIGH

## 2011-08-17 LAB — POCT CARDIAC MARKERS
CKMB, poc: <1 — ABNORMAL LOW
Myoglobin, poc: 33.3

## 2011-09-02 ENCOUNTER — Inpatient Hospital Stay (HOSPITAL_COMMUNITY)
Admission: AD | Admit: 2011-09-02 | Discharge: 2011-09-02 | Disposition: A | Discharge status: Home / Self Care | Payer: 59 | Source: Ambulatory Visit | Attending: Obstetrics and Gynecology | Admitting: Obstetrics and Gynecology

## 2011-09-02 DIAGNOSIS — O479 False labor, unspecified: Secondary | ICD-10-CM

## 2011-09-02 DIAGNOSIS — B009 Herpesviral infection, unspecified: Secondary | ICD-10-CM | POA: Diagnosis present

## 2011-09-02 DIAGNOSIS — O4700 False labor before 37 completed weeks of gestation, unspecified trimester: Secondary | ICD-10-CM | POA: Insufficient documentation

## 2011-09-02 DIAGNOSIS — Z3490 Encounter for supervision of normal pregnancy, unspecified, unspecified trimester: Secondary | ICD-10-CM

## 2011-09-02 DIAGNOSIS — O09529 Supervision of elderly multigravida, unspecified trimester: Secondary | ICD-10-CM | POA: Diagnosis present

## 2011-09-02 LAB — URINALYSIS, ROUTINE W REFLEX MICROSCOPIC
Bilirubin Urine: NEGATIVE
Hgb urine dipstick: NEGATIVE
Ketones, ur: NEGATIVE mg/dL
Nitrite: NEGATIVE
Specific Gravity, Urine: <1.005 — ABNORMAL LOW (ref 1.005–1.030)
Urobilinogen, UA: 0.2 mg/dL (ref 0.0–1.0)

## 2011-09-02 LAB — FETAL FIBRONECTIN: Fetal Fibronectin: NEGATIVE

## 2011-09-02 LAB — WET PREP, GENITAL: Trich, Wet Prep: NONE SEEN

## 2011-09-02 MED ORDER — BUTORPHANOL TARTRATE 2 MG/ML IJ SOLN
1.0000 mg | Freq: Once | INTRAMUSCULAR | Status: AC
  Administered 2011-09-02: 1 mg via INTRAVENOUS
  Filled 2011-09-02: qty 1

## 2011-09-02 MED ORDER — LACTATED RINGERS IV SOLN
INTRAVENOUS | Status: DC
  Administered 2011-09-02: 20:00:00 via INTRAVENOUS

## 2011-09-02 MED ORDER — ONDANSETRON 8 MG PO TBDP
8.0000 mg | ORAL_TABLET | Freq: Once | ORAL | Status: AC
Start: 2011-09-02 — End: 2011-09-02
  Administered 2011-09-02: 8 mg via ORAL
  Filled 2011-09-02: qty 1

## 2011-09-02 MED ORDER — CYCLOBENZAPRINE HCL 10 MG PO TABS
10.0000 mg | ORAL_TABLET | Freq: Once | ORAL | Status: AC
  Administered 2011-09-02: 10 mg via ORAL
  Filled 2011-09-02: qty 1

## 2011-09-02 MED ORDER — LACTATED RINGERS IV BOLUS (SEPSIS)
1000.0000 mL | Freq: Once | INTRAVENOUS | Status: AC
  Administered 2011-09-02: 1000 mL via INTRAVENOUS

## 2011-09-02 MED ORDER — TERBUTALINE SULFATE 1 MG/ML IJ SOLN
0.2500 mg | Freq: Once | INTRAMUSCULAR | Status: AC
  Administered 2011-09-02: 0.25 mg via SUBCUTANEOUS
  Filled 2011-09-02: qty 1

## 2011-09-02 MED ORDER — NIFEDIPINE 10 MG PO CAPS: 10.0000 mg | ORAL_CAPSULE | Freq: Three times a day (TID) | ORAL | Status: DC | PRN

## 2011-09-02 MED ORDER — NIFEDIPINE 10 MG PO CAPS
10.0000 mg | ORAL_CAPSULE | Freq: Once | ORAL | Status: AC
  Administered 2011-09-02: 10 mg via ORAL
  Filled 2011-09-02: qty 1

## 2011-09-02 NOTE — Progress Notes (Signed)
Pt reports started feeling sick yesterday. Having lower back pain and abd cramping followed by n/v/d.

## 2011-09-02 NOTE — ED Provider Notes (Signed)
History   [name] is a 35 y.o. year old G46P0010 female at [redacted]w[redacted]d weeks gestation who presents to MAU reporting mild-mod contractions Q 5 minutes, chills, not feeling well and new onset of N/V/D since yesterday. She has vomited once per day yesterday and today. Prior to this episode she had not had a BM for 6 days, used a Fleets enema w/ modest results. She denies VB, LOF, vaginal discharge.   CSN: [id number] Arrival date & time: 09/02/2011  5:06 PM   None     Chief Complaint  Patient presents with  . Abdominal Cramping  . Diarrhea    (Consider location/radiation/quality/duration/timing/severity/associated sxs/prior treatment) HPI  Past Medical History  Diagnosis Date  . Kidney stone     stent and lithotrisy 2009    Past Surgical History  Procedure Date  . Laparoscopy for ectopic pregnancy 2008    mtx also  . Lithotripsy   . Cystoscopy/retrograde/ureteroscopy     Family History  Problem Relation Age of Onset  . Cancer Maternal Grandmother     History  Substance Use Topics  . Smoking status: Never Smoker   . Smokeless tobacco: Not on file  . Alcohol Use: No    OB History    Grav Para Term Preterm Abortions TAB SAB Ect Mult Living   2    1   1         Review of Systems: Otherwise neg  Allergies  Prochlorperazine edisylate and Sulfa antibiotics  Home Medications  No current outpatient prescriptions on file.  BP 146/82  Pulse 122  Temp(Src) 98.4 F (36.9 C) (Oral)  Resp 18  Ht 5\' 3"  (1.6 m)  Wt 63.594 kg (140 lb 3.2 oz)  BMI 24.84 kg/m2  Physical Exam  Constitutional: She is oriented to person, place, and time. She appears well-developed and well-nourished. She appears distressed (mild-mod distress).  Cardiovascular: Tachycardia present.   Pulmonary/Chest: Effort normal.  Abdominal: Soft. There is no tenderness.  Genitourinary: Vagina normal and uterus normal.  Neurological: She is alert and oriented to person, place, and time.  Skin: Skin is  warm and dry.  Psychiatric: Her mood appears anxious.     EFM: 150's category I Toco: Q2-3 mild -mod ED Course  Procedures (including critical care time) Results for orders placed during the hospital encounter of 09/02/11 (from the past 24 hour(s))  URINALYSIS, ROUTINE W REFLEX MICROSCOPIC     Status: Abnormal   Collection Time   09/02/11  5:20 PM      Component Value Range   Color, Urine YELLOW  YELLOW    Appearance CLEAR  CLEAR    Specific Gravity, Urine <1.005 (*) 1.005 - 1.030    pH 7.0  5.0 - 8.0    Glucose, UA NEGATIVE  NEGATIVE (mg/dL)   Hgb urine dipstick NEGATIVE  NEGATIVE    Bilirubin Urine NEGATIVE  NEGATIVE    Ketones, ur NEGATIVE  NEGATIVE (mg/dL)   Protein, ur NEGATIVE  NEGATIVE (mg/dL)   Urobilinogen, UA 0.2  0.0 - 1.0 (mg/dL)   Nitrite NEGATIVE  NEGATIVE    Leukocytes, UA NEGATIVE  NEGATIVE   WET PREP, GENITAL     Status: Abnormal   Collection Time   09/02/11  5:48 PM      Component Value Range   Yeast, Wet Prep NONE SEEN  NONE SEEN    Trich, Wet Prep NONE SEEN  NONE SEEN    Clue Cells, Wet Prep NONE SEEN  NONE SEEN    WBC,  Wet Prep HPF POC FEW (*) NONE SEEN   FETAL FIBRONECTIN     Status: Normal   Collection Time   09/02/11  5:55 PM      Component Value Range   Fetal Fibronectin NEGATIVE  NEGATIVE      MDM  1915: Pain worse after terb. Reports UC's Q2-3. Toco adjusted. VE repeated. Dilation: Closed Effacement (%): 50 Cervical Position: Posterior Exam by:: Ivonne Andrew CNM Orders received for Procardia and Stadol Care assumed by Maylon Cos, CNM at 2000.  [place]    Discussed with Dr. Ambrose Mantle, UCs spacing out after oral procardia. Okay to D/C home with Rx and precautions.  Assessment: [name] Ctx  Plan: Discharge Home Rx Procardia 10mg  q 8 hours prn ctx FU as schedule in office PTL precautions reviewed.

## 2011-09-15 ENCOUNTER — Inpatient Hospital Stay (HOSPITAL_COMMUNITY)
Admission: AD | Admit: 2011-09-15 | Discharge: 2011-09-15 | Disposition: A | Discharge status: Home / Self Care | Payer: 59 | Source: Ambulatory Visit | Attending: Obstetrics and Gynecology | Admitting: Obstetrics and Gynecology

## 2011-09-15 ENCOUNTER — Encounter (HOSPITAL_COMMUNITY): Payer: Self-pay | Admitting: *Deleted

## 2011-09-15 DIAGNOSIS — O4700 False labor before 37 completed weeks of gestation, unspecified trimester: Secondary | ICD-10-CM | POA: Insufficient documentation

## 2011-09-15 DIAGNOSIS — O479 False labor, unspecified: Secondary | ICD-10-CM

## 2011-09-15 LAB — URINALYSIS, ROUTINE W REFLEX MICROSCOPIC
Glucose, UA: NEGATIVE mg/dL
Ketones, ur: NEGATIVE mg/dL
Leukocytes, UA: NEGATIVE
Nitrite: NEGATIVE
Protein, ur: NEGATIVE mg/dL
pH: 7 (ref 5.0–8.0)

## 2011-09-15 MED ORDER — ZOLPIDEM TARTRATE 10 MG PO TABS
10.0000 mg | ORAL_TABLET | Freq: Once | ORAL | Status: AC
  Administered 2011-09-15: 10 mg via ORAL
  Filled 2011-09-15: qty 1

## 2011-09-15 NOTE — ED Provider Notes (Signed)
History   Pt presents today c/o lower abd pain and pressure. She states she had some ctx last night and they have seemed to intensify since she finished a 12hr work shift. She reports GFM and denies vag dc or bleeding.  Chief Complaint  Patient presents with  . Back Pain  . Contractions   HPI  OB History    Grav Para Term Preterm Abortions TAB SAB Ect Mult Living   2    1   1         Past Medical History  Diagnosis Date  . Kidney stone     stent and lithotrisy 2009    Past Surgical History  Procedure Date  . Laparoscopy for ectopic pregnancy 2008    mtx also  . Lithotripsy   . Cystoscopy/retrograde/ureteroscopy     Family History  Problem Relation Age of Onset  . Cancer Maternal Grandmother     History  Substance Use Topics  . Smoking status: Never Smoker   . Smokeless tobacco: Not on file  . Alcohol Use: No    Allergies:  Allergies  Allergen Reactions  . Prochlorperazine Edisylate Other (See Comments)    tachycardia  . Sulfa Antibiotics Hives and Itching    Prescriptions prior to admission  Medication Sig Dispense Refill  . acetaminophen (TYLENOL) 500 MG tablet Take 500 mg by mouth every 6 (six) hours as needed. For pain       . ferrous sulfate 325 (65 FE) MG tablet Take 325 mg by mouth daily with breakfast.        . NIFEdipine (PROCARDIA) 10 MG capsule Take 1 capsule (10 mg total) by mouth 3 (three) times daily as needed (contractions).  30 capsule  1  . nitrofurantoin, macrocrystal-monohydrate, (MACROBID) 100 MG capsule Take 100 mg by mouth 2 (two) times daily.        . prenatal vitamin w/FE, FA (PRENATAL 1 + 1) 27-1 MG TABS Take 1 tablet by mouth daily.          Review of Systems  Constitutional: Negative for fever.  Cardiovascular: Negative for chest pain.  Gastrointestinal: Positive for abdominal pain. Negative for nausea, vomiting, diarrhea and constipation.  Genitourinary: Negative for dysuria, urgency, frequency and hematuria.  Neurological:  Negative for dizziness and headaches.  Psychiatric/Behavioral: Negative for depression and suicidal ideas.   Physical Exam   Blood pressure 130/81, pulse 103, temperature 98.3 F (36.8 C), temperature source Oral, height 5\' 3"  (1.6 m), weight 146 lb 4 oz (66.339 kg).  Physical Exam  Nursing note and vitals reviewed. Constitutional: She is oriented to person, place, and time. She appears well-developed and well-nourished. No distress.  HENT:  Head: Normocephalic and atraumatic.  Eyes: EOM are normal. Pupils are equal, round, and reactive to light.  GI: Soft. She exhibits no distension. There is no tenderness. There is no rebound and no guarding.  Genitourinary: No bleeding around the vagina. No vaginal discharge found.       Cervix Cl/50/-3.  Neurological: She is alert and oriented to person, place, and time.  Skin: Skin is warm and dry. She is not diaphoretic.  Psychiatric: She has a normal mood and affect. Her behavior is normal. Judgment and thought content normal.    MAU Course  Procedures  Results for orders placed during the hospital encounter of 09/15/11 (from the past 24 hour(s))  URINALYSIS, ROUTINE W REFLEX MICROSCOPIC     Status: Normal   Collection Time   09/15/11 10:00 PM  Component Value Range   Color, Urine YELLOW  YELLOW    Appearance CLEAR  CLEAR    Specific Gravity, Urine 1.020  1.005 - 1.030    pH 7.0  5.0 - 8.0    Glucose, UA NEGATIVE  NEGATIVE (mg/dL)   Hgb urine dipstick NEGATIVE  NEGATIVE    Bilirubin Urine NEGATIVE  NEGATIVE    Ketones, ur NEGATIVE  NEGATIVE (mg/dL)   Protein, ur NEGATIVE  NEGATIVE (mg/dL)   Urobilinogen, UA 0.2  0.0 - 1.0 (mg/dL)   Nitrite NEGATIVE  NEGATIVE    Leukocytes, UA NEGATIVE  NEGATIVE     NST reactive. Ctx every 2 min on arrival but have now stopped.  Discussed pt with Dr. Senaida Ores. Will give ambien and have pt f/u in office.  Assessment and Plan  Braxton hicks: discussed with pt at length. Discussed diet,  activity, risks, and precautions. Discussed signs and sx of preterm labor.  Clinton Gallant. Mekisha Bittel III, DrHSc, MPAS, PA-C  09/15/2011, 10:34 PM   Henrietta Hoover, PA 09/15/11 2255

## 2011-09-15 NOTE — Progress Notes (Signed)
Pt states, " I started having back pain yesterday but it is significantly worse today and I am having a lot of pelvic pressure and contractions. I've worked my 12 hr shift at [employer] today and I recently was dx with a bladder infection."

## 2011-09-21 LAB — STREP B DNA PROBE: GBS: POSITIVE

## 2011-10-01 ENCOUNTER — Inpatient Hospital Stay (HOSPITAL_COMMUNITY)
Admission: AD | Admit: 2011-10-01 | Discharge: 2011-10-03 | DRG: 781 | Disposition: A | Discharge status: Home / Self Care | Payer: 59 | Source: Ambulatory Visit | Attending: Obstetrics and Gynecology | Admitting: Obstetrics and Gynecology

## 2011-10-01 ENCOUNTER — Encounter (HOSPITAL_COMMUNITY): Payer: Self-pay | Admitting: *Deleted

## 2011-10-01 ENCOUNTER — Inpatient Hospital Stay (HOSPITAL_COMMUNITY): Payer: 59

## 2011-10-01 DIAGNOSIS — O09529 Supervision of elderly multigravida, unspecified trimester: Secondary | ICD-10-CM

## 2011-10-01 DIAGNOSIS — J40 Bronchitis, not specified as acute or chronic: Secondary | ICD-10-CM | POA: Diagnosis present

## 2011-10-01 DIAGNOSIS — O99891 Other specified diseases and conditions complicating pregnancy: Principal | ICD-10-CM | POA: Diagnosis present

## 2011-10-01 DIAGNOSIS — J111 Influenza due to unidentified influenza virus with other respiratory manifestations: Secondary | ICD-10-CM | POA: Diagnosis present

## 2011-10-01 DIAGNOSIS — B009 Herpesviral infection, unspecified: Secondary | ICD-10-CM

## 2011-10-01 LAB — URINALYSIS, ROUTINE W REFLEX MICROSCOPIC
Bilirubin Urine: NEGATIVE
Glucose, UA: NEGATIVE mg/dL
Hgb urine dipstick: NEGATIVE
Ketones, ur: NEGATIVE mg/dL
Protein, ur: NEGATIVE mg/dL

## 2011-10-01 LAB — DIFFERENTIAL
Basophils Absolute: 0 10*3/uL (ref 0.0–0.1)
Basophils Relative: 0 % (ref 0–1)
Lymphocytes Relative: 13 % (ref 12–46)
Neutro Abs: 9.2 10*3/uL — ABNORMAL HIGH (ref 1.7–7.7)
Neutrophils Relative %: 74 % (ref 43–77)

## 2011-10-01 LAB — COMPREHENSIVE METABOLIC PANEL
ALT: 8 U/L (ref 0–35)
AST: 13 U/L (ref 0–37)
Albumin: 2.6 g/dL — ABNORMAL LOW (ref 3.5–5.2)
Alkaline Phosphatase: 99 U/L (ref 39–117)
CO2: 22 mEq/L (ref 19–32)
Chloride: 102 mEq/L (ref 96–112)
Potassium: 4 mEq/L (ref 3.5–5.1)
Total Bilirubin: 0.2 mg/dL — ABNORMAL LOW (ref 0.3–1.2)

## 2011-10-01 LAB — CBC
Hemoglobin: 11.2 g/dL — ABNORMAL LOW (ref 12.0–15.0)
MCHC: 33.5 g/dL (ref 30.0–36.0)
RDW: 14.5 % (ref 11.5–15.5)
WBC: 12.4 10*3/uL — ABNORMAL HIGH (ref 4.0–10.5)

## 2011-10-01 MED ORDER — SODIUM CHLORIDE 0.9 % IV SOLN
250.0000 mL | INTRAVENOUS | Status: DC | PRN
  Administered 2011-10-02: 250 mL via INTRAVENOUS

## 2011-10-01 MED ORDER — VALACYCLOVIR HCL 500 MG PO TABS
500.0000 mg | ORAL_TABLET | Freq: Two times a day (BID) | ORAL | Status: DC
  Administered 2011-10-01 – 2011-10-03 (×4): 500 mg via ORAL
  Filled 2011-10-01 (×4): qty 1

## 2011-10-01 MED ORDER — OXYCODONE-ACETAMINOPHEN 5-325 MG PO TABS: 1.0000 | ORAL_TABLET | Freq: Once | ORAL | Status: DC

## 2011-10-01 MED ORDER — SODIUM CHLORIDE 0.9 % IV SOLN
3.0000 g | Freq: Once | INTRAVENOUS | Status: AC
  Administered 2011-10-01: 3 g via INTRAVENOUS
  Filled 2011-10-01: qty 3

## 2011-10-01 MED ORDER — CALCIUM CARBONATE ANTACID 500 MG PO CHEW: 2.0000 | CHEWABLE_TABLET | ORAL | Status: DC | PRN

## 2011-10-01 MED ORDER — ZOLPIDEM TARTRATE 10 MG PO TABS
10.0000 mg | ORAL_TABLET | Freq: Every evening | ORAL | Status: DC | PRN
  Administered 2011-10-01: 10 mg via ORAL
  Filled 2011-10-01: qty 1

## 2011-10-01 MED ORDER — DEXTROMETHORPHAN HBR 15 MG/5ML PO SYRP: 5.0000 mL | ORAL_SOLUTION | Freq: Four times a day (QID) | ORAL | Status: DC | PRN

## 2011-10-01 MED ORDER — DEXTROMETHORPHAN POLISTIREX 30 MG/5ML PO LQCR
30.0000 mg | Freq: Two times a day (BID) | ORAL | Status: DC | PRN
  Administered 2011-10-02 – 2011-10-03 (×2): 30 mg via ORAL
  Filled 2011-10-01: qty 5

## 2011-10-01 MED ORDER — ALBUTEROL SULFATE (5 MG/ML) 0.5% IN NEBU
2.5000 mg | INHALATION_SOLUTION | RESPIRATORY_TRACT | Status: DC
  Administered 2011-10-02 (×2): 2.5 mg via RESPIRATORY_TRACT
  Filled 2011-10-01 (×4): qty 0.5

## 2011-10-01 MED ORDER — ALBUTEROL SULFATE (5 MG/ML) 0.5% IN NEBU
2.5000 mg | INHALATION_SOLUTION | Freq: Once | RESPIRATORY_TRACT | Status: AC
  Administered 2011-10-01: 2.5 mg via RESPIRATORY_TRACT
  Filled 2011-10-01: qty 0.5

## 2011-10-01 MED ORDER — SODIUM CHLORIDE 0.9 % IV SOLN
INTRAVENOUS | Status: DC
  Administered 2011-10-01 – 2011-10-02 (×2): via INTRAVENOUS

## 2011-10-01 MED ORDER — IPRATROPIUM BROMIDE 0.02 % IN SOLN
0.5000 mg | Freq: Once | RESPIRATORY_TRACT | Status: AC
  Administered 2011-10-01: 0.5 mg via RESPIRATORY_TRACT
  Filled 2011-10-01: qty 2.5

## 2011-10-01 MED ORDER — OXYCODONE HCL 5 MG PO TABS
5.0000 mg | ORAL_TABLET | Freq: Once | ORAL | Status: AC
  Administered 2011-10-01: 5 mg via ORAL
  Filled 2011-10-01: qty 1

## 2011-10-01 MED ORDER — PRENATAL PLUS 27-1 MG PO TABS
1.0000 | ORAL_TABLET | Freq: Every day | ORAL | Status: DC
  Administered 2011-10-02 – 2011-10-03 (×2): 1 via ORAL
  Filled 2011-10-01 (×2): qty 1

## 2011-10-01 MED ORDER — AZITHROMYCIN 500 MG PO TABS
500.0000 mg | ORAL_TABLET | Freq: Every day | ORAL | Status: DC
  Administered 2011-10-02: 500 mg via ORAL
  Filled 2011-10-01: qty 1

## 2011-10-01 MED ORDER — IBUPROFEN 600 MG PO TABS
600.0000 mg | ORAL_TABLET | Freq: Once | ORAL | Status: AC
  Administered 2011-10-01: 600 mg via ORAL
  Filled 2011-10-01: qty 1

## 2011-10-01 MED ORDER — ACETAMINOPHEN 325 MG PO TABS: 650.0000 mg | ORAL_TABLET | ORAL | Status: DC | PRN

## 2011-10-01 MED ORDER — SODIUM CHLORIDE 0.9 % IJ SOLN: 3.0000 mL | INTRAMUSCULAR | Status: DC | PRN

## 2011-10-01 MED ORDER — SALINE SPRAY 0.65 % NA SOLN
1.0000 | NASAL | Status: DC | PRN
  Filled 2011-10-01: qty 44

## 2011-10-01 MED ORDER — DOCUSATE SODIUM 100 MG PO CAPS
100.0000 mg | ORAL_CAPSULE | Freq: Every day | ORAL | Status: DC
  Administered 2011-10-02 – 2011-10-03 (×2): 100 mg via ORAL
  Filled 2011-10-01 (×2): qty 1

## 2011-10-01 MED ORDER — ACETAMINOPHEN 500 MG PO TABS: 500.0000 mg | ORAL_TABLET | Freq: Four times a day (QID) | ORAL | Status: DC | PRN

## 2011-10-01 MED ORDER — SODIUM CHLORIDE 0.9 % IJ SOLN: 3.0000 mL | Freq: Two times a day (BID) | INTRAMUSCULAR | Status: DC

## 2011-10-01 MED ORDER — OXYCODONE-ACETAMINOPHEN 5-325 MG PO TABS
2.0000 | ORAL_TABLET | Freq: Four times a day (QID) | ORAL | Status: DC | PRN
  Administered 2011-10-02 – 2011-10-03 (×4): 2 via ORAL
  Filled 2011-10-01 (×4): qty 2

## 2011-10-01 MED ORDER — AZITHROMYCIN 500 MG PO TABS
500.0000 mg | ORAL_TABLET | Freq: Once | ORAL | Status: AC
  Administered 2011-10-01: 500 mg via ORAL
  Filled 2011-10-01: qty 2

## 2011-10-01 MED ORDER — SODIUM CHLORIDE 0.9 % IV SOLN
3.0000 g | Freq: Four times a day (QID) | INTRAVENOUS | Status: DC
  Administered 2011-10-02 (×2): 3 g via INTRAVENOUS
  Filled 2011-10-01 (×5): qty 3

## 2011-10-01 MED ORDER — PREDNISONE 50 MG PO TABS
60.0000 mg | ORAL_TABLET | Freq: Once | ORAL | Status: AC
  Administered 2011-10-01: 60 mg via ORAL
  Filled 2011-10-01: qty 1

## 2011-10-01 NOTE — Progress Notes (Signed)
Patient ID: [name], female   DOB: 10-07-1976, 35 y.o.   MRN: [id number] CXR showed reactive airway changes with no consolidations.  C/w severe bronchitis.  Will allow pt to receive unasyn for 2-3 doses and nebulizers overnight.  If improves can possibly be managed outpatient on azithromycin in next 24 hours.

## 2011-10-01 NOTE — ED Provider Notes (Signed)
History     CSN: [id number] Arrival date & time: 10/01/2011  6:49 PM   None     Chief Complaint  Patient presents with  . Shortness of Breath    HPI [name] is a 35 y.o. female @ [redacted]w[redacted]d who presents to MAU for chest tightness and shortness of breath. She began feeling bad 4 days ago and has had fever up to 101. Taking tylenol regularly. Denies vaginal bleeding or leaking of fluid. Denies feeling any contractions.  Past Medical History  Diagnosis Date  . Kidney stone     stent and lithotrisy 2009    Past Surgical History  Procedure Date  . Laparoscopy for ectopic pregnancy 2008    mtx also  . Lithotripsy   . Cystoscopy/retrograde/ureteroscopy     Family History  Problem Relation Age of Onset  . Cancer Maternal Grandmother     History  Substance Use Topics  . Smoking status: Never Smoker   . Smokeless tobacco: Not on file  . Alcohol Use: No    OB History    Grav Para Term Preterm Abortions TAB SAB Ect Mult Living   2    1   1         Review of Systems  Constitutional: Positive for fever, chills and fatigue.  HENT: Positive for congestion.   Respiratory: Positive for cough, chest tightness, shortness of breath and wheezing.   Gastrointestinal: Negative for nausea, vomiting, abdominal pain and diarrhea.  Genitourinary: Negative for dysuria, vaginal bleeding and vaginal discharge.  Skin: Negative.   Neurological: Positive for light-headedness and headaches.  Psychiatric/Behavioral: The patient is nervous/anxious.     Allergies  Prochlorperazine edisylate and Sulfa antibiotics  Home Medications  No current outpatient prescriptions on file.  BP 137/90  Pulse 123  Temp(Src) 99.1 F (37.3 C) (Oral)  Resp 22  SpO2 99%  Physical Exam  Nursing note and vitals reviewed. Constitutional: She is oriented to person, place, and time. She appears well-developed and well-nourished.  HENT:  Head: Normocephalic and atraumatic.  Eyes: EOM are normal.  Neck: Neck  supple.  Cardiovascular:       tachycardia  Pulmonary/Chest: Accessory muscle usage present. She has wheezes.       Decreased breath sounds bilaterally. Prolonged expirations. Unable to speak in full sentences.  Abdominal: Soft. There is no tenderness.  Musculoskeletal: Normal range of motion.  Neurological: She is alert and oriented to person, place, and time. No cranial nerve deficit.  Skin: Skin is warm and dry.  Psychiatric: Her behavior is normal. Judgment and thought content normal. Her mood appears anxious.   Assessment: Respiratory difficulty at 36.[redacted] weeks gestation  Plan:  Albuterol/Atrovent neb. Treatment   CBC with diff, CMET   Chest x-ray   Prednisone 60 mg po now   Repeat neb. Treatment after CXR   Screen for influenza   Oxycodone 5 mg for headache  ED Course: Consult with Dr. Senaida Ores and she agrees with plan of care. She will come to MAU to evaluate patient and make a decision as to admission.  Procedures          [place], NP 10/01/11 [phone]

## 2011-10-01 NOTE — Progress Notes (Signed)
Pt presents with complaint of shortness of breath, fever (100.7 at home), cough, heaviness in chest, and headache. resp symptoms x 4 days. Denies contractions, bleeding or ROM

## 2011-10-01 NOTE — H&P (Signed)
[name] is a 35 y.o. female G2P0010 at 36weeks 3 days (EDD 10/26/11 by 6 week [place])  presented to MAU with 3 day h/o fever, cough, and malaise.  Pt reports has gotten progressively worse with SOB severe today and heaviness in chest as well as severe HA.  She has been taking tylenol q 4 hours but fever of 101+ has not abated.  Severity of chest symptoms brought her in acutely.  Prenatal care has been complicated by frequent UTI's and possible concurrent kidney stones.  She has had at least 3 documented UTI's that have been treated appropriately.  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 History OB History    Grav Para Term Preterm Abortions TAB SAB Ect Mult Living   2    1   1       02/2011 Ectopic Laparoscopic partial salpingectomy  Past Medical History  Diagnosis Date  . Kidney stone     stent and lithotrisy 2009  Anxiety/depression  Past Surgical History  Procedure Date  . Laparoscopy for ectopic pregnancy 2008    mtx also  . Lithotripsy   . Cystoscopy/retrograde/ureteroscopy    Family History: family history includes Cancer in her maternal grandmother. Social History:  reports that she has never smoked. She does not have any smokeless tobacco history on file. She reports that she does not drink alcohol or use illicit drugs.  Review of Systems  Constitutional: Positive for fever.  Respiratory: Positive for cough, shortness of breath and wheezing.   Neurological: Positive for headaches.      Blood pressure 137/90, pulse 82, temperature 99.1 F (37.3 C), temperature source Oral, resp. rate 18, SpO2 99.00%. Maternal Exam:  Abdomen: Patient reports no abdominal tenderness. Introitus: Normal vulva. Normal vagina.    Physical Exam  Constitutional: She is oriented to person, place, and time. She appears well-nourished. She appears distressed.       Able to speak in short sentences, but is SOB  Cardiovascular: Regular rhythm.        Tachycardic  to 130"s with fever  Respiratory: She has wheezes. She has rales.       Right wheezing is diffuse, left with scattered wheezes s/p one respiratory treatment BS decreased in bases bilaterally  GI: Soft.  Neurological: She is alert and oriented to person, place, and time.  Psychiatric: She has a normal mood and affect.    WBC 12,000 UA negative CMET WNL  Prenatal labs: ABO, Rh:  O positive Antibody:  negative Rubella:  immune RPR:   negative HBsAg:   neg HIV:   NR GBS:   positive per pt First trimester screen WNL AFP WNL  One hour GTT WNL   Assessment/Plan: Pt with clinical pneumonia, wheezing and SOB.  Pulse ox stable at 98-99% but pt visibly with constricted breathing even after one breathing treatment.  No h/o asthma. Will get CXR to assess for consolidations and get a flu test as well.  Probably begin Unasyn and Azithromycin while flu test pending.  FHR reactive and no contractions.  Oliver Pila 10/01/2011, 7:59 PM

## 2011-10-01 NOTE — Progress Notes (Signed)
Breathing improved after resp treatments. To 151 per w/c

## 2011-10-02 LAB — CBC
Hemoglobin: 10.4 g/dL — ABNORMAL LOW (ref 12.0–15.0)
MCH: 31 pg (ref 26.0–34.0)
MCHC: 33.5 g/dL (ref 30.0–36.0)
RDW: 14.6 % (ref 11.5–15.5)

## 2011-10-02 LAB — INFLUENZA PANEL BY PCR (TYPE A & B): Influenza A By PCR: POSITIVE — AB

## 2011-10-02 MED ORDER — IPRATROPIUM BROMIDE 0.02 % IN SOLN
0.5000 mg | RESPIRATORY_TRACT | Status: DC | PRN
  Administered 2011-10-02: 0.5 mg via RESPIRATORY_TRACT
  Filled 2011-10-02: qty 2.5

## 2011-10-02 MED ORDER — BUTORPHANOL TARTRATE 2 MG/ML IJ SOLN
1.0000 mg | Freq: Once | INTRAMUSCULAR | Status: AC
  Administered 2011-10-02: 2 mg via INTRAVENOUS
  Filled 2011-10-02: qty 1

## 2011-10-02 MED ORDER — ISOMETHEPTENE-APAP-DICHLORAL 65-325-100 MG PO CAPS
2.0000 | ORAL_CAPSULE | Freq: Once | ORAL | Status: AC
  Administered 2011-10-02: 2 via ORAL

## 2011-10-02 MED ORDER — ALBUTEROL SULFATE (5 MG/ML) 0.5% IN NEBU
2.5000 mg | INHALATION_SOLUTION | RESPIRATORY_TRACT | Status: DC | PRN
  Administered 2011-10-02: 2.5 mg via RESPIRATORY_TRACT
  Filled 2011-10-02: qty 0.5

## 2011-10-02 MED ORDER — ALBUTEROL SULFATE (5 MG/ML) 0.5% IN NEBU
2.5000 mg | INHALATION_SOLUTION | Freq: Four times a day (QID) | RESPIRATORY_TRACT | Status: DC
  Administered 2011-10-02 – 2011-10-03 (×5): 2.5 mg via RESPIRATORY_TRACT
  Filled 2011-10-02 (×5): qty 0.5

## 2011-10-02 MED ORDER — DIPHENHYDRAMINE HCL 50 MG/ML IJ SOLN
25.0000 mg | Freq: Every evening | INTRAMUSCULAR | Status: DC | PRN
  Administered 2011-10-02: 25 mg via INTRAVENOUS
  Filled 2011-10-02: qty 1

## 2011-10-02 MED ORDER — OSELTAMIVIR PHOSPHATE 75 MG PO CAPS
75.0000 mg | ORAL_CAPSULE | Freq: Two times a day (BID) | ORAL | Status: DC
  Administered 2011-10-02 – 2011-10-03 (×3): 75 mg via ORAL
  Filled 2011-10-02 (×3): qty 1

## 2011-10-02 MED ORDER — ISOMETHEPTENE-APAP-DICHLORAL 65-325-100 MG PO CAPS: 1.0000 | ORAL_CAPSULE | ORAL | Status: DC | PRN

## 2011-10-02 NOTE — Progress Notes (Signed)
UR Chart review completed.  

## 2011-10-02 NOTE — Progress Notes (Signed)
Pt c/o tightening in abdomen thought to be UC.  Monitors applied and assessing

## 2011-10-02 NOTE — Progress Notes (Signed)
HD#2  36w 4d, bronchitis SOB is better, having back pain and HA, feeling some ctx Afeb, VSS except sl tachy Lungs- good air movement, no wheezes or rales FHT- Cat I, irreg ctx Will continue nebulizers q 6 hrs for now, continue Unasyn and Zithromax.  Has percocet ordered for HA, will also try midrin.

## 2011-10-02 NOTE — Progress Notes (Signed)
Influenza A positive. Will start Tamiflu, d/c Unasyn, continue zithromax.

## 2011-10-03 MED ORDER — OSELTAMIVIR PHOSPHATE 75 MG PO CAPS: 75.0000 mg | ORAL_CAPSULE | Freq: Two times a day (BID) | ORAL | Status: AC

## 2011-10-03 MED ORDER — ALBUTEROL SULFATE HFA 108 (90 BASE) MCG/ACT IN AERS
2.0000 | INHALATION_SPRAY | Freq: Four times a day (QID) | RESPIRATORY_TRACT | Status: DC | PRN
  Filled 2011-10-03: qty 6.7

## 2011-10-03 MED ORDER — ALBUTEROL SULFATE HFA 108 (90 BASE) MCG/ACT IN AERS: 2.0000 | INHALATION_SPRAY | Freq: Four times a day (QID) | RESPIRATORY_TRACT | Status: DC | PRN

## 2011-10-03 NOTE — Discharge Summary (Signed)
Pt verbalizes understanding of all discharge instructions.

## 2011-10-03 NOTE — Progress Notes (Signed)
PT given 2.5 mg Albuterol per order. Pt is tollerating treatment well. Pt still has expiratory wheezes in the lower lobes. I would recommend sending patient home with a Albuterol Inhaler for PRN use for when she goes home. Thank you

## 2011-10-03 NOTE — Progress Notes (Signed)
HD#3, 36w 5 d, flu Feeling better, wants to go home Afeb, VSS, still a little tachy FHT- Cat I Will d/c home on Tamiflu, f/u later this week

## 2011-10-10 NOTE — Discharge Summary (Signed)
Physician Discharge Summary  Patient ID: Kadiatou Oplinger MRN: [id number] DOB/AGE: January 12, 1976 35 y.o.  Admit date: 10/01/2011 Discharge date: 10/03/2011  Admission Diagnoses:IUP at 36 weeks, upper respiratory infection  Discharge Diagnoses: IUP at 36 weeks, influenza respiratory illness Active Problems:  * No active hospital problems. *    Discharged Condition: good  Hospital Course: Pt was admitted by Dr. Senaida Ores and started on Unasyn and Zithromax for possible pneumonia.  She received Albuterol nebulizer treatments for wheezing.  Flu test returned positive for influenza A, she was started on Tamiflu, abx stopped.  She continued to need nebulizer treatments but on 11-28 was much improved and felt to be stable to d/c home.  FHR reactive throughout hospital stay.  Did have some ctx, cervix did not change.    Consults: none  Significant Diagnostic Studies: flu test, CXR  Treatments: antibiotics: Unasyn, azithromycin and Tamiflu  Discharge Exam: Blood pressure 128/86, pulse 99, temperature 98.3 F (36.8 C), temperature source Oral, resp. rate 18, height 5\' 3"  (1.6 m), weight 66.225 kg (146 lb), SpO2 100.00%. Resp: clear to auscultation bilaterally  Disposition: Home or Self Care  Discharge Orders    Future Orders Please Complete By Expires   Fetal Kick Count:  Lie on our left side for one hour after a meal, and count the number of times your baby kicks.  If it is less than 5 times, get up, move around and drink some juice.  Repeat the test 30 minutes later.  If it is still less than 5 kicks in an hour, notify your doctor.      Discharge activity:  No Restrictions      Discharge diet:  No restrictions        Discharge Medication List as of 10/03/2011  9:39 AM    START taking these medications   Details  albuterol (PROVENTIL HFA;VENTOLIN HFA) 108 (90 BASE) MCG/ACT inhaler Inhale 2 puffs into the lungs every 6 (six) hours as needed for wheezing or shortness of breath., Starting  10/03/2011, Until Thu 10/02/12, Normal    oseltamivir (TAMIFLU) 75 MG capsule Take 1 capsule (75 mg total) by mouth 2 (two) times daily., Starting 10/03/2011, Until Sat 10/13/11, Normal      CONTINUE these medications which have NOT CHANGED   Details  acetaminophen (TYLENOL) 500 MG tablet Take 500 mg by mouth every 6 (six) hours as needed. For pain , Until Discontinued, Historical Med    dextromethorphan 15 MG/5ML syrup Take 5 mLs by mouth 4 (four) times daily as needed. Patient is using this medication for cold symptoms. , Until Discontinued, Historical Med    prenatal vitamin w/FE, FA (PRENATAL 1 + 1) 27-1 MG TABS Take 1 tablet by mouth daily.  , Until Discontinued, Historical Med    sodium chloride (OCEAN) 0.65 % nasal spray Place 1 spray into the nose as needed. Patient is using this medication for nasal congestion. , Until Discontinued, Historical Med    valACYclovir (VALTREX) 500 MG tablet Take 500 mg by mouth daily.  , Until Discontinued, Historical Med    ferrous sulfate 325 (65 FE) MG tablet Take 325 mg by mouth daily with breakfast.  , Until Discontinued, Historical Med    NIFEdipine (PROCARDIA) 10 MG capsule Take 1 capsule (10 mg total) by mouth 3 (three) times daily as needed (contractions)., Starting 09/02/2011, Until Mon 09/01/12, Normal    nitrofurantoin, macrocrystal-monohydrate, (MACROBID) 100 MG capsule Take 100 mg by mouth 2 (two) times daily.  , Until Discontinued, Historical Med  Follow-up Information    Follow up with Amery Minasyan D, MD. Make an appointment in 2 days.   Contact information:   [address] [place] [address] [phone]          Signed: Zenaida Niece 10/10/2011, 8:06 AM

## 2011-10-14 ENCOUNTER — Inpatient Hospital Stay (HOSPITAL_COMMUNITY)
Admission: AD | Admit: 2011-10-14 | Discharge: 2011-10-14 | Disposition: A | Discharge status: Home / Self Care | Payer: 59 | Source: Ambulatory Visit | Attending: Obstetrics and Gynecology | Admitting: Obstetrics and Gynecology

## 2011-10-14 DIAGNOSIS — O09529 Supervision of elderly multigravida, unspecified trimester: Secondary | ICD-10-CM | POA: Insufficient documentation

## 2011-10-14 DIAGNOSIS — M545 Low back pain, unspecified: Secondary | ICD-10-CM | POA: Insufficient documentation

## 2011-10-14 DIAGNOSIS — A6000 Herpesviral infection of urogenital system, unspecified: Secondary | ICD-10-CM | POA: Insufficient documentation

## 2011-10-14 DIAGNOSIS — O98519 Other viral diseases complicating pregnancy, unspecified trimester: Secondary | ICD-10-CM | POA: Insufficient documentation

## 2011-10-14 DIAGNOSIS — B009 Herpesviral infection, unspecified: Secondary | ICD-10-CM

## 2011-10-14 LAB — POCT FERN TEST: Fern Test: NEGATIVE

## 2011-10-14 NOTE — Progress Notes (Signed)
Spec exam completed to r/o rupture

## 2011-10-14 NOTE — Progress Notes (Signed)
Pt up ambulatory per dr. Jackelyn Knife

## 2011-10-14 NOTE — Progress Notes (Signed)
FHR from today reviewed.  Reactive NST.

## 2011-10-14 NOTE — Progress Notes (Signed)
Dr. Jackelyn Knife updated on pt status, new orders received.

## 2011-10-25 ENCOUNTER — Inpatient Hospital Stay (HOSPITAL_COMMUNITY)
Admission: AD | Admit: 2011-10-25 | Discharge: 2011-10-25 | Disposition: A | Discharge status: Home / Self Care | Payer: 59 | Source: Ambulatory Visit | Attending: Obstetrics and Gynecology | Admitting: Obstetrics and Gynecology

## 2011-10-25 ENCOUNTER — Encounter (HOSPITAL_COMMUNITY): Payer: Self-pay | Admitting: Obstetrics and Gynecology

## 2011-10-25 DIAGNOSIS — O09529 Supervision of elderly multigravida, unspecified trimester: Secondary | ICD-10-CM

## 2011-10-25 DIAGNOSIS — M549 Dorsalgia, unspecified: Secondary | ICD-10-CM

## 2011-10-25 DIAGNOSIS — M545 Low back pain, unspecified: Secondary | ICD-10-CM | POA: Insufficient documentation

## 2011-10-25 DIAGNOSIS — O288 Other abnormal findings on antenatal screening of mother: Secondary | ICD-10-CM

## 2011-10-25 DIAGNOSIS — O368190 Decreased fetal movements, unspecified trimester, not applicable or unspecified: Secondary | ICD-10-CM | POA: Insufficient documentation

## 2011-10-25 DIAGNOSIS — B009 Herpesviral infection, unspecified: Secondary | ICD-10-CM

## 2011-10-25 DIAGNOSIS — O479 False labor, unspecified: Secondary | ICD-10-CM | POA: Insufficient documentation

## 2011-10-25 LAB — URINALYSIS, ROUTINE W REFLEX MICROSCOPIC
Bilirubin Urine: NEGATIVE
Ketones, ur: NEGATIVE mg/dL
Leukocytes, UA: NEGATIVE
Nitrite: NEGATIVE
Protein, ur: 30 mg/dL — AB
Urobilinogen, UA: 1 mg/dL (ref 0.0–1.0)
pH: 5.5 (ref 5.0–8.0)

## 2011-10-25 LAB — URINE MICROSCOPIC-ADD ON

## 2011-10-25 MED ORDER — ZOLPIDEM TARTRATE 10 MG PO TABS
10.0000 mg | ORAL_TABLET | Freq: Once | ORAL | Status: AC
  Administered 2011-10-25: 10 mg via ORAL
  Filled 2011-10-25: qty 1

## 2011-10-25 MED ORDER — ZOLPIDEM TARTRATE 10 MG PO TABS: 10.0000 mg | ORAL_TABLET | Freq: Once | ORAL | Status: DC

## 2011-10-25 NOTE — ED Provider Notes (Signed)
History     Chief Complaint  Patient presents with  . Decreased Fetal Movement  . Contractions   HPI 35 y.o. G2P0010 at [redacted]w[redacted]d, c/o decreased fetal movement, low back pain and diarrhea x 1 tonight. + nausea. Lost mucous plug around 1 PM today. No bleeding, no LOF.     Past Medical History  Diagnosis Date  . Kidney stone     stent and lithotrisy 2009  . Genital herpes     Past Surgical History  Procedure Date  . Laparoscopy for ectopic pregnancy 2008    mtx also  . Lithotripsy   . Cystoscopy/retrograde/ureteroscopy     Family History  Problem Relation Age of Onset  . Cancer Maternal Grandmother     History  Substance Use Topics  . Smoking status: Never Smoker   . Smokeless tobacco: Not on file  . Alcohol Use: No    Allergies:  Allergies  Allergen Reactions  . Sulfa Antibiotics Hives and Itching  . Prochlorperazine Edisylate Anxiety    tachycardia    Prescriptions prior to admission  Medication Sig Dispense Refill  . acetaminophen (TYLENOL) 500 MG tablet Take 500 mg by mouth every 6 (six) hours as needed. For pain       . albuterol (PROVENTIL HFA;VENTOLIN HFA) 108 (90 BASE) MCG/ACT inhaler Inhale 2 puffs into the lungs every 6 (six) hours as needed for wheezing or shortness of breath.  1 Inhaler  1  . cephALEXin (KEFLEX) 250 MG capsule Take 250 mg by mouth 4 (four) times daily.        . ferrous sulfate 325 (65 FE) MG tablet Take 325 mg by mouth daily with breakfast.        . nitrofurantoin, macrocrystal-monohydrate, (MACROBID) 100 MG capsule Take 100 mg by mouth 2 (two) times daily.        . prenatal vitamin w/FE, FA (PRENATAL 1 + 1) 27-1 MG TABS Take 1 tablet by mouth daily.        . valACYclovir (VALTREX) 500 MG tablet Take 500 mg by mouth daily.        Marland Kitchen dextromethorphan 15 MG/5ML syrup Take 5 mLs by mouth 4 (four) times daily as needed. Patient is using this medication for cold symptoms.       Marland Kitchen NIFEdipine (PROCARDIA) 10 MG capsule Take 1 capsule (10 mg  total) by mouth 3 (three) times daily as needed (contractions).  30 capsule  1    Review of Systems  Constitutional: Negative.   Eyes: Negative for blurred vision and double vision.  Respiratory: Negative.   Cardiovascular: Negative.   Gastrointestinal: Positive for nausea and diarrhea. Negative for vomiting, abdominal pain and constipation.  Genitourinary: Negative for dysuria, urgency, frequency, hematuria and flank pain.       Negative for vaginal bleeding, Positive contractions  Musculoskeletal: Positive for back pain.  Neurological: Positive for headaches.  Psychiatric/Behavioral: Negative.    Physical Exam   Blood pressure 140/83, pulse 105, temperature 98.7 F (37.1 C), temperature source Oral, resp. rate 20, height 5\' 4"  (1.626 m), weight 152 lb 8 oz (69.174 kg).  Physical Exam  Nursing note and vitals reviewed. Constitutional: She is oriented to person, place, and time. She appears well-developed and well-nourished. No distress.  Respiratory: Effort normal.  GI: Soft. Bowel sounds are normal. She exhibits no distension. There is no tenderness.  Genitourinary:       SVE: FT/High per RN  Musculoskeletal: Normal range of motion.  Neurological: She is alert and oriented  to person, place, and time.  Skin: Skin is warm and dry.  Psychiatric: She has a normal mood and affect.   EFM reactive, TOCO: irregular contractions, infrequent prior to d/c MAU Course  Procedures Results for orders placed during the hospital encounter of 10/25/11 (from the past 24 hour(s))  URINALYSIS, ROUTINE W REFLEX MICROSCOPIC     Status: Abnormal   Collection Time   10/25/11  7:30 PM      Component Value Range   Color, Urine YELLOW  YELLOW    APPearance CLEAR  CLEAR    Specific Gravity, Urine >1.030 (*) 1.005 - 1.030    pH 5.5  5.0 - 8.0    Glucose, UA NEGATIVE  NEGATIVE (mg/dL)   Hgb urine dipstick NEGATIVE  NEGATIVE    Bilirubin Urine NEGATIVE  NEGATIVE    Ketones, ur NEGATIVE  NEGATIVE  (mg/dL)   Protein, ur 30 (*) NEGATIVE (mg/dL)   Urobilinogen, UA 1.0  0.0 - 1.0 (mg/dL)   Nitrite NEGATIVE  NEGATIVE    Leukocytes, UA NEGATIVE  NEGATIVE   URINE MICROSCOPIC-ADD ON     Status: Abnormal   Collection Time   10/25/11  7:30 PM      Component Value Range   Squamous Epithelial / LPF MANY (*) RARE    WBC, UA 3-6  <3 (WBC/hpf)   Urine-Other MUCOUS PRESENT       Assessment and Plan  35 y.o. G2P0010 at [redacted]w[redacted]d False labor Reactive NST Ambien 10 mg, 1 tab PO in MAU, rx for #10 given F/U as scheduled or sooner PRN  Bain Whichard 10/25/2011, 9:00 PM

## 2011-10-25 NOTE — Progress Notes (Signed)
Pt presents to MAU with chief complaint of decreased fetal movement and "severe lower back pain". Pt is 39 w6d, G2P0. Pt says she has not felt the baby move in 1 hr. Pt states the back pain started around 1500.

## 2011-10-25 NOTE — Progress Notes (Signed)
HAS HX - HSV- LAST OUTBREAK 3 YEARS AGO- TAKING VALTREX.    VE YESTERDAY- TOO MUCH SCAR TISSUE- HAD SURGERY   AT 35 YO.- CRYO.   DENIES MRSA.   FEELS CONSTANT BACK ACHE-/

## 2011-10-30 ENCOUNTER — Encounter (HOSPITAL_COMMUNITY): Payer: Self-pay | Admitting: Pediatric Intensive Care

## 2011-10-30 ENCOUNTER — Inpatient Hospital Stay (HOSPITAL_COMMUNITY)
Admission: AD | Admit: 2011-10-30 | Discharge: 2011-11-02 | DRG: 768 | Disposition: A | Discharge status: Home / Self Care | Payer: 59 | Source: Ambulatory Visit | Attending: Obstetrics and Gynecology | Admitting: Obstetrics and Gynecology

## 2011-10-30 ENCOUNTER — Other Ambulatory Visit: Payer: Self-pay | Admitting: Obstetrics and Gynecology

## 2011-10-30 DIAGNOSIS — B009 Herpesviral infection, unspecified: Secondary | ICD-10-CM

## 2011-10-30 DIAGNOSIS — Z22330 Carrier of Group B streptococcus: Secondary | ICD-10-CM

## 2011-10-30 DIAGNOSIS — S3763XA Laceration of uterus, initial encounter: Secondary | ICD-10-CM | POA: Diagnosis not present

## 2011-10-30 DIAGNOSIS — O99892 Other specified diseases and conditions complicating childbirth: Secondary | ICD-10-CM | POA: Diagnosis present

## 2011-10-30 DIAGNOSIS — O09529 Supervision of elderly multigravida, unspecified trimester: Secondary | ICD-10-CM | POA: Diagnosis present

## 2011-10-30 LAB — CBC
MCV: 92.6 fL (ref 78.0–100.0)
Platelets: 280 10*3/uL (ref 150–400)
RBC: 3.67 MIL/uL — ABNORMAL LOW (ref 3.87–5.11)
WBC: 13.1 10*3/uL — ABNORMAL HIGH (ref 4.0–10.5)

## 2011-10-30 LAB — HIV ANTIBODY (ROUTINE TESTING W REFLEX): HIV: NONREACTIVE

## 2011-10-30 LAB — ANTIBODY SCREEN: Antibody Screen: NEGATIVE

## 2011-10-30 LAB — GC/CHLAMYDIA PROBE AMP, GENITAL: Gonorrhea: NEGATIVE

## 2011-10-30 MED ORDER — TERBUTALINE SULFATE 1 MG/ML IJ SOLN
0.2500 mg | Freq: Once | INTRAMUSCULAR | Status: AC | PRN
  Filled 2011-10-30: qty 1

## 2011-10-30 MED ORDER — OXYTOCIN BOLUS FROM INFUSION
500.0000 mL | Freq: Once | INTRAVENOUS | Status: DC
  Filled 2011-10-30: qty 500

## 2011-10-30 MED ORDER — BUTORPHANOL TARTRATE 2 MG/ML IJ SOLN
1.0000 mg | INTRAMUSCULAR | Status: DC | PRN
  Administered 2011-10-31: 1 mg via INTRAVENOUS
  Filled 2011-10-30: qty 1

## 2011-10-30 MED ORDER — CITRIC ACID-SODIUM CITRATE 334-500 MG/5ML PO SOLN: 30.0000 mL | ORAL | Status: DC | PRN

## 2011-10-30 MED ORDER — OXYCODONE-ACETAMINOPHEN 5-325 MG PO TABS
1.0000 | ORAL_TABLET | ORAL | Status: DC | PRN
  Administered 2011-10-31: 1 via ORAL
  Filled 2011-10-30: qty 1

## 2011-10-30 MED ORDER — IBUPROFEN 600 MG PO TABS
600.0000 mg | ORAL_TABLET | Freq: Four times a day (QID) | ORAL | Status: DC | PRN
  Filled 2011-10-30: qty 1

## 2011-10-30 MED ORDER — ONDANSETRON HCL 4 MG/2ML IJ SOLN
4.0000 mg | Freq: Four times a day (QID) | INTRAMUSCULAR | Status: DC | PRN
  Administered 2011-10-31 (×2): 4 mg via INTRAVENOUS
  Filled 2011-10-30 (×2): qty 2

## 2011-10-30 MED ORDER — LACTATED RINGERS IV SOLN: 500.0000 mL | INTRAVENOUS | Status: DC | PRN

## 2011-10-30 MED ORDER — MISOPROSTOL 25 MCG QUARTER TABLET
25.0000 ug | ORAL_TABLET | ORAL | Status: DC | PRN
  Administered 2011-10-30 – 2011-10-31 (×2): 25 ug via VAGINAL
  Filled 2011-10-30 (×2): qty 0.25

## 2011-10-30 MED ORDER — ACETAMINOPHEN 325 MG PO TABS
650.0000 mg | ORAL_TABLET | ORAL | Status: DC | PRN
  Administered 2011-10-30: 650 mg via ORAL
  Filled 2011-10-30: qty 2

## 2011-10-30 MED ORDER — ZOLPIDEM TARTRATE 10 MG PO TABS
10.0000 mg | ORAL_TABLET | Freq: Once | ORAL | Status: AC
  Administered 2011-10-30: 10 mg via ORAL
  Filled 2011-10-30: qty 1

## 2011-10-30 MED ORDER — LACTATED RINGERS IV SOLN
INTRAVENOUS | Status: DC
  Administered 2011-10-30 – 2011-10-31 (×3): via INTRAVENOUS

## 2011-10-30 MED ORDER — LIDOCAINE HCL (PF) 1 % IJ SOLN
30.0000 mL | INTRAMUSCULAR | Status: DC | PRN
  Filled 2011-10-30: qty 30

## 2011-10-30 MED ORDER — PENICILLIN G POTASSIUM 5000000 UNITS IJ SOLR
2.5000 10*6.[IU] | INTRAVENOUS | Status: DC
  Administered 2011-10-31 (×4): 2.5 10*6.[IU] via INTRAVENOUS
  Filled 2011-10-30 (×8): qty 2.5

## 2011-10-30 MED ORDER — PENICILLIN G POTASSIUM 5000000 UNITS IJ SOLR
5.0000 10*6.[IU] | Freq: Once | INTRAVENOUS | Status: AC
  Administered 2011-10-30: 5 10*6.[IU] via INTRAVENOUS
  Filled 2011-10-30: qty 5

## 2011-10-30 MED ORDER — OXYTOCIN 20 UNITS IN LACTATED RINGERS INFUSION - SIMPLE
125.0000 mL/h | Freq: Once | INTRAVENOUS | Status: DC
  Filled 2011-10-30: qty 1000

## 2011-10-30 NOTE — Progress Notes (Signed)
Made aware of pt status: SVE, uterine contraction pattern, FHT tracing. New orders given. Will continue to monitor.

## 2011-10-30 NOTE — H&P (Signed)
[name] is a 35 y.o. female, G2 P0010, EGA 40+ weeks with EDC 12-21 presenting for ripening and induction.  Prenatal care complicated by admission 3 weeks ago for the flu.  Also on Valtrex for h/o HSV, prenatal care otherwise essentially uncomplicated.  See prenatal records for complete history.  History OB History    Grav Para Term Preterm Abortions TAB SAB Ect Mult Living   2 0 0 0 1 0 0 1 0 0      Past Medical History  Diagnosis Date  . Kidney stone     stent and lithotrisy 2009  . Genital herpes   Cervical dysplasia  Past Surgical History  Procedure Date  . Laparoscopy for ectopic pregnancy 2008    mtx also  . Lithotripsy   . Cystoscopy/retrograde/ureteroscopy   Cryotherapy  Family History: family history includes Cancer in her maternal grandmother. Social History:  reports that she has never smoked. She does not have any smokeless tobacco history on file. She reports that she does not drink alcohol or use illicit drugs.  Review of Systems  Respiratory: Negative.   Cardiovascular: Negative.     Dilation: Fingertip Effacement (%): 50 Station: -2 Exam by:: Felipa Furnace RN Blood pressure 126/95, pulse 116, temperature 98.6 F (37 C), temperature source Oral, resp. rate 20, height 5\' 4"  (1.626 m), weight 70.308 kg (155 lb). Maternal Exam:  Uterine Assessment: Contraction strength is mild.  Contraction frequency is irregular.   Abdomen: Estimated fetal weight is 7 1/2 lbs.   Fetal presentation: vertex  Introitus: Normal vulva. Normal vagina.  Pelvis: adequate for delivery.   Cervix: Cervix evaluated by digital exam.     Fetal Exam Fetal Monitor Review: Mode: ultrasound.   Baseline rate: 140.  Variability: moderate (6-25 bpm).   Pattern: accelerations present and no decelerations.    Fetal State Assessment: Category I - tracings are normal.     Physical Exam  Constitutional: She appears well-developed and well-nourished.  Cardiovascular: Normal rate, regular  rhythm and normal heart sounds.   No murmur heard. Respiratory: Breath sounds normal. No respiratory distress.  GI: Soft.       gravid    Prenatal labs: ABO, Rh: O/Positive/-- (12/25 0000) Antibody: Negative (12/25 0000) Rubella: Immune (12/25 0000) RPR:   NR HBsAg: Negative (12/25 0000)  HIV: Non-reactive (12/25 0000)  GBS: Positive (11/16 0000)   Assessment/Plan: IUP at 40+ weeks admitted for ripening and induction.  May have some cervical scarring from prior cryotherapy.  Will try Cytotec overnight for ripening, start PCN for +GBS.     Jarett Dralle D 10/30/2011, 9:38 PM

## 2011-10-31 ENCOUNTER — Encounter (HOSPITAL_COMMUNITY): Payer: Self-pay | Admitting: Anesthesiology

## 2011-10-31 ENCOUNTER — Encounter (HOSPITAL_COMMUNITY): Payer: Self-pay | Admitting: *Deleted

## 2011-10-31 ENCOUNTER — Inpatient Hospital Stay (HOSPITAL_COMMUNITY): Payer: 59 | Admitting: Anesthesiology

## 2011-10-31 LAB — RPR: RPR Ser Ql: NONREACTIVE

## 2011-10-31 MED ORDER — METHYLERGONOVINE MALEATE 0.2 MG PO TABS
0.2000 mg | ORAL_TABLET | ORAL | Status: DC | PRN
  Administered 2011-11-01: 0.2 mg via ORAL
  Filled 2011-10-31: qty 1

## 2011-10-31 MED ORDER — LANOLIN HYDROUS EX OINT: TOPICAL_OINTMENT | CUTANEOUS | Status: DC | PRN

## 2011-10-31 MED ORDER — METHYLERGONOVINE MALEATE 0.2 MG/ML IJ SOLN: 0.2000 mg | INTRAMUSCULAR | Status: DC | PRN

## 2011-10-31 MED ORDER — WITCH HAZEL-GLYCERIN EX PADS: 1.0000 "application " | MEDICATED_PAD | CUTANEOUS | Status: DC | PRN

## 2011-10-31 MED ORDER — DIBUCAINE 1 % RE OINT: 1.0000 "application " | TOPICAL_OINTMENT | RECTAL | Status: DC | PRN

## 2011-10-31 MED ORDER — OXYTOCIN 20 UNITS IN LACTATED RINGERS INFUSION - SIMPLE
1.0000 m[IU]/min | INTRAVENOUS | Status: DC
  Administered 2011-10-31: 5 m[IU]/min via INTRAVENOUS
  Administered 2011-10-31: 1 m[IU]/min via INTRAVENOUS

## 2011-10-31 MED ORDER — IBUPROFEN 600 MG PO TABS
600.0000 mg | ORAL_TABLET | Freq: Four times a day (QID) | ORAL | Status: DC
  Administered 2011-10-31 – 2011-11-02 (×3): 600 mg via ORAL
  Filled 2011-10-31 (×2): qty 1

## 2011-10-31 MED ORDER — OXYCODONE-ACETAMINOPHEN 5-325 MG PO TABS
1.0000 | ORAL_TABLET | ORAL | Status: DC | PRN
  Administered 2011-10-31 (×2): 1 via ORAL
  Administered 2011-11-01 – 2011-11-02 (×4): 2 via ORAL
  Administered 2011-11-02 (×3): 1 via ORAL
  Filled 2011-10-31 (×4): qty 2
  Filled 2011-10-31: qty 1
  Filled 2011-10-31 (×2): qty 2
  Filled 2011-10-31 (×3): qty 1

## 2011-10-31 MED ORDER — DIPHENHYDRAMINE HCL 25 MG PO CAPS: 25.0000 mg | ORAL_CAPSULE | Freq: Four times a day (QID) | ORAL | Status: DC | PRN

## 2011-10-31 MED ORDER — SENNOSIDES-DOCUSATE SODIUM 8.6-50 MG PO TABS
2.0000 | ORAL_TABLET | Freq: Every day | ORAL | Status: DC
  Administered 2011-10-31 – 2011-11-02 (×2): 2 via ORAL

## 2011-10-31 MED ORDER — MEASLES, MUMPS & RUBELLA VAC ~~LOC~~ INJ: 0.5000 mL | INJECTION | Freq: Once | SUBCUTANEOUS | Status: DC

## 2011-10-31 MED ORDER — OXYTOCIN 20 UNITS IN LACTATED RINGERS INFUSION - SIMPLE: 125.0000 mL/h | INTRAVENOUS | Status: DC | PRN

## 2011-10-31 MED ORDER — DIPHENHYDRAMINE HCL 50 MG/ML IJ SOLN: 12.5000 mg | INTRAMUSCULAR | Status: DC | PRN

## 2011-10-31 MED ORDER — BENZOCAINE-MENTHOL 20-0.5 % EX AERO: 1.0000 "application " | INHALATION_SPRAY | CUTANEOUS | Status: DC | PRN

## 2011-10-31 MED ORDER — LIDOCAINE HCL 1.5 % IJ SOLN
INTRAMUSCULAR | Status: DC | PRN
  Administered 2011-10-31 (×2): 5 mL via EPIDURAL

## 2011-10-31 MED ORDER — PHENYLEPHRINE 40 MCG/ML (10ML) SYRINGE FOR IV PUSH (FOR BLOOD PRESSURE SUPPORT): 80.0000 ug | PREFILLED_SYRINGE | INTRAVENOUS | Status: DC | PRN

## 2011-10-31 MED ORDER — ONDANSETRON HCL 4 MG PO TABS: 4.0000 mg | ORAL_TABLET | ORAL | Status: DC | PRN

## 2011-10-31 MED ORDER — TERBUTALINE SULFATE 1 MG/ML IJ SOLN
0.2500 mg | Freq: Once | INTRAMUSCULAR | Status: AC
  Administered 2011-10-31: 0.25 mg via SUBCUTANEOUS

## 2011-10-31 MED ORDER — ONDANSETRON HCL 4 MG/2ML IJ SOLN: 4.0000 mg | INTRAMUSCULAR | Status: DC | PRN

## 2011-10-31 MED ORDER — EPHEDRINE 5 MG/ML INJ: 10.0000 mg | INTRAVENOUS | Status: DC | PRN

## 2011-10-31 MED ORDER — ZOLPIDEM TARTRATE 5 MG PO TABS: 5.0000 mg | ORAL_TABLET | Freq: Every evening | ORAL | Status: DC | PRN

## 2011-10-31 MED ORDER — PHENYLEPHRINE 40 MCG/ML (10ML) SYRINGE FOR IV PUSH (FOR BLOOD PRESSURE SUPPORT)
80.0000 ug | PREFILLED_SYRINGE | INTRAVENOUS | Status: DC | PRN
  Filled 2011-10-31: qty 5

## 2011-10-31 MED ORDER — LACTATED RINGERS IV SOLN: 500.0000 mL | Freq: Once | INTRAVENOUS | Status: DC

## 2011-10-31 MED ORDER — MAGNESIUM HYDROXIDE 400 MG/5ML PO SUSP: 30.0000 mL | ORAL | Status: DC | PRN

## 2011-10-31 MED ORDER — PRENATAL MULTIVITAMIN CH
1.0000 | ORAL_TABLET | Freq: Every day | ORAL | Status: DC
  Administered 2011-11-01 – 2011-11-02 (×2): 1 via ORAL
  Filled 2011-10-31 (×2): qty 1

## 2011-10-31 MED ORDER — TETANUS-DIPHTH-ACELL PERTUSSIS 5-2.5-18.5 LF-MCG/0.5 IM SUSP
0.5000 mL | Freq: Once | INTRAMUSCULAR | Status: AC
  Administered 2011-11-01: 0.5 mL via INTRAMUSCULAR
  Filled 2011-10-31: qty 0.5

## 2011-10-31 MED ORDER — EPHEDRINE 5 MG/ML INJ
10.0000 mg | INTRAVENOUS | Status: DC | PRN
  Filled 2011-10-31: qty 4

## 2011-10-31 MED ORDER — FENTANYL 2.5 MCG/ML BUPIVACAINE 1/10 % EPIDURAL INFUSION (WH - ANES)
INTRAMUSCULAR | Status: DC | PRN
  Administered 2011-10-31: 14 mL/h via EPIDURAL

## 2011-10-31 MED ORDER — SIMETHICONE 80 MG PO CHEW: 80.0000 mg | CHEWABLE_TABLET | ORAL | Status: DC | PRN

## 2011-10-31 MED ORDER — FENTANYL 2.5 MCG/ML BUPIVACAINE 1/10 % EPIDURAL INFUSION (WH - ANES)
14.0000 mL/h | INTRAMUSCULAR | Status: DC
  Administered 2011-10-31 (×2): 14 mL/h via EPIDURAL
  Filled 2011-10-31 (×3): qty 60

## 2011-10-31 NOTE — Progress Notes (Signed)
Uncomfortable with ctx Afeb, VSS FHT- Cat I, ctx q 2-3 minutes VE- able to break through dimple, 2-3/80/-1, vtx Will get epidural, then AROM and decide if needs pitocin

## 2011-10-31 NOTE — Anesthesia Preprocedure Evaluation (Signed)
Anesthesia Evaluation  Patient identified by MRN, date of birth, ID band Patient awake    Reviewed: Allergy & Precautions, H&P , NPO status , Patient's Chart, lab work & pertinent test results  Airway Mallampati: I TM Distance: >3 FB Neck ROM: full    Dental No notable dental hx.    Pulmonary neg pulmonary ROS,  clear to auscultation  Pulmonary exam normal       Cardiovascular neg cardio ROS     Neuro/Psych PSYCHIATRIC DISORDERS Anxiety    GI/Hepatic negative GI ROS, Neg liver ROS,   Endo/Other  Negative Endocrine ROS  Renal/GU negative Renal ROS  Genitourinary negative   Musculoskeletal negative musculoskeletal ROS (+)   Abdominal Normal abdominal exam  (+)   Peds negative pediatric ROS (+)  Hematology negative hematology ROS (+)   Anesthesia Other Findings   Reproductive/Obstetrics (+) Pregnancy                           Anesthesia Physical Anesthesia Plan  ASA: II  Anesthesia Plan: Epidural   Post-op Pain Management:    Induction:   Airway Management Planned:   Additional Equipment:   Intra-op Plan:   Post-operative Plan:   Informed Consent: I have reviewed the patients History and Physical, chart, labs and discussed the procedure including the risks, benefits and alternatives for the proposed anesthesia with the patient or authorized representative who has indicated his/her understanding and acceptance.     Plan Discussed with:   Anesthesia Plan Comments:         Anesthesia Quick Evaluation

## 2011-10-31 NOTE — Anesthesia Procedure Notes (Signed)
Epidural Patient location during procedure: OB Start time: 10/31/2011 8:20 AM End time: 10/31/2011 8:25 AM Reason for block: procedure for pain  Staffing Anesthesiologist: Sandrea Hughs Performed by: anesthesiologist   Preanesthetic Checklist Completed: patient identified, site marked, surgical consent, pre-op evaluation, timeout performed, IV checked, risks and benefits discussed and monitors and equipment checked  Epidural Patient position: sitting Prep: site prepped and draped and DuraPrep Patient monitoring: continuous pulse ox and blood pressure Approach: midline Injection technique: LOR air  Needle:  Needle type: Tuohy  Needle gauge: 17 G Needle length: 9 cm Needle insertion depth: 6 cm Catheter type: closed end flexible Catheter size: 19 Gauge Catheter at skin depth: 11 cm Test dose: negative and 1.5% lidocaine  Assessment Sensory level: T8 Events: blood not aspirated, injection not painful, no injection resistance, negative IV test and no paresthesia

## 2011-10-31 NOTE — Anesthesia Postprocedure Evaluation (Signed)
  Anesthesia Post-op Note  Patient: [name]  Procedure(s) Performed: * No procedures listed *  Patient Location: Mother/Baby  Anesthesia Type: Epidural  Level of Consciousness: awake, alert  and oriented  Airway and Oxygen Therapy: Patient Spontanous Breathing  Post-op Pain: mild  Post-op Assessment: Post-op Vital signs reviewed, Patient's Cardiovascular Status Stable, Respiratory Function Stable, Patent Airway, No signs of Nausea or vomiting and Pain level controlled  Post-op Vital Signs: stable  Complications: No apparent anesthesia complications

## 2011-10-31 NOTE — Anesthesia Postprocedure Evaluation (Signed)
Anesthesia Post Note  Patient: [name]  Procedure(s) Performed: * No procedures listed *  Anesthesia type: Epidural  Patient location: Mother/Baby  Post pain: Pain level controlled  Post assessment: Post-op Vital signs reviewed  Last Vitals:  Filed Vitals:   10/31/11 1816  BP: 146/69  Pulse: 107  Temp:   Resp:     Post vital signs: Reviewed  Level of consciousness: awake  Complications: No apparent anesthesia complications

## 2011-10-31 NOTE — Progress Notes (Signed)
MD made aware of pt status: FHT tracing, uterine contraction pattern (tachysystole) and SVE. Will continue to monitor.

## 2011-10-31 NOTE — Progress Notes (Signed)
Feels better with epidural VE- 3/8-/0, vtx, AROM mod meconium FHT- Cat I, ctx q 1-2 min Cont PCN and monitor progress with AROM

## 2011-10-31 NOTE — Addendum Note (Signed)
Addendum  created 10/31/11 2234 by Quin Hoop Jassen Sarver   Modules edited:Charges VN, Notes Section

## 2011-10-31 NOTE — Progress Notes (Signed)
Comfortable Afeb, VSS FHT- Cat II, some variable and late decels, mod variability, ctx q 1-2 min VE- 4/80-0, vtx, IUPC and FSE applied Will monitor FHT closely with internal monitors, try one shot of terb for frequent ctx.

## 2011-10-31 NOTE — Progress Notes (Signed)
FHT improved, mod variability, + accels, ctx spaced out to q 3-4 min Will start low dose pitocin to keep ctx going, monitor progress and FHT

## 2011-11-01 ENCOUNTER — Inpatient Hospital Stay (HOSPITAL_COMMUNITY): Payer: 59

## 2011-11-01 ENCOUNTER — Encounter (HOSPITAL_COMMUNITY): Payer: Self-pay

## 2011-11-01 ENCOUNTER — Encounter (HOSPITAL_COMMUNITY)
Admission: AD | Disposition: A | Discharge status: Home / Self Care | Payer: Self-pay | Source: Ambulatory Visit | Attending: Obstetrics and Gynecology

## 2011-11-01 DIAGNOSIS — S3763XA Laceration of uterus, initial encounter: Secondary | ICD-10-CM | POA: Diagnosis not present

## 2011-11-01 HISTORY — PX: DILATION AND CURETTAGE OF UTERUS: SHX78

## 2011-11-01 HISTORY — PX: LAPAROSCOPY: SHX197

## 2011-11-01 LAB — CBC
HCT: 21.5 % — ABNORMAL LOW (ref 36.0–46.0)
Hemoglobin: 7.3 g/dL — ABNORMAL LOW (ref 12.0–15.0)
Hemoglobin: 6.9 g/dL — CL (ref 12.0–15.0)
MCH: 32.1 pg (ref 26.0–34.0)
MCHC: 34.2 g/dL (ref 30.0–36.0)
MCV: 93.9 fL (ref 78.0–100.0)
MCV: 94 fL (ref 78.0–100.0)
Platelets: 134 10*3/uL — ABNORMAL LOW (ref 150–400)
RBC: 2.5 MIL/uL — ABNORMAL LOW (ref 3.87–5.11)
WBC: 15.8 10*3/uL — ABNORMAL HIGH (ref 4.0–10.5)
WBC: 16.2 10*3/uL — ABNORMAL HIGH (ref 4.0–10.5)

## 2011-11-01 LAB — PREPARE RBC (CROSSMATCH)

## 2011-11-01 SURGERY — LAPAROSCOPY OPERATIVE
Anesthesia: Monitor Anesthesia Care | Site: Vagina | Wound class: Clean

## 2011-11-01 MED ORDER — LACTATED RINGERS IV SOLN
INTRAVENOUS | Status: DC | PRN
  Administered 2011-11-01 (×3): via INTRAVENOUS

## 2011-11-01 MED ORDER — FENTANYL CITRATE 0.05 MG/ML IJ SOLN
INTRAMUSCULAR | Status: DC | PRN
  Administered 2011-11-01: 100 ug via INTRAVENOUS
  Administered 2011-11-01: 50 ug via INTRAVENOUS
  Administered 2011-11-01: 100 ug via INTRAVENOUS

## 2011-11-01 MED ORDER — DIPHENHYDRAMINE HCL 25 MG PO CAPS
25.0000 mg | ORAL_CAPSULE | Freq: Once | ORAL | Status: AC
  Administered 2011-11-01: 25 mg via ORAL
  Filled 2011-11-01: qty 1

## 2011-11-01 MED ORDER — DEXAMETHASONE SODIUM PHOSPHATE 10 MG/ML IJ SOLN
INTRAMUSCULAR | Status: AC
  Filled 2011-11-01: qty 1

## 2011-11-01 MED ORDER — ROCURONIUM BROMIDE 50 MG/5ML IV SOLN
INTRAVENOUS | Status: AC
  Filled 2011-11-01: qty 1

## 2011-11-01 MED ORDER — METOCLOPRAMIDE HCL 5 MG/ML IJ SOLN: 10.0000 mg | Freq: Once | INTRAMUSCULAR | Status: DC | PRN

## 2011-11-01 MED ORDER — KCL-LACTATED RINGERS 20 MEQ/L IV SOLN: INTRAVENOUS | Status: DC

## 2011-11-01 MED ORDER — ONDANSETRON HCL 4 MG/2ML IJ SOLN
INTRAMUSCULAR | Status: DC | PRN
  Administered 2011-11-01: 4 mg via INTRAVENOUS

## 2011-11-01 MED ORDER — ACETAMINOPHEN 325 MG PO TABS
650.0000 mg | ORAL_TABLET | Freq: Once | ORAL | Status: AC
  Administered 2011-11-01: 650 mg via ORAL
  Filled 2011-11-01 (×2): qty 1

## 2011-11-01 MED ORDER — GLYCOPYRROLATE 0.2 MG/ML IJ SOLN
INTRAMUSCULAR | Status: AC
  Filled 2011-11-01: qty 3

## 2011-11-01 MED ORDER — EPHEDRINE 5 MG/ML INJ
INTRAVENOUS | Status: AC
  Filled 2011-11-01: qty 10

## 2011-11-01 MED ORDER — MEPERIDINE HCL 25 MG/ML IJ SOLN
INTRAMUSCULAR | Status: AC
  Filled 2011-11-01: qty 1

## 2011-11-01 MED ORDER — LACTATED RINGERS IV SOLN
INTRAVENOUS | Status: DC
  Administered 2011-11-01 – 2011-11-02 (×2): via INTRAVENOUS

## 2011-11-01 MED ORDER — MIDAZOLAM HCL 5 MG/5ML IJ SOLN
INTRAMUSCULAR | Status: DC | PRN
  Administered 2011-11-01: 2 mg via INTRAVENOUS

## 2011-11-01 MED ORDER — SODIUM CHLORIDE 0.9 % IV SOLN: INTRAVENOUS | Status: DC

## 2011-11-01 MED ORDER — BUTORPHANOL TARTRATE 2 MG/ML IJ SOLN
1.0000 mg | Freq: Once | INTRAMUSCULAR | Status: AC
Start: 2011-11-01 — End: 2011-11-01
  Administered 2011-11-01: 1 mg via INTRAVENOUS
  Filled 2011-11-01: qty 1

## 2011-11-01 MED ORDER — LIDOCAINE HCL (CARDIAC) 20 MG/ML IV SOLN
INTRAVENOUS | Status: AC
  Filled 2011-11-01: qty 5

## 2011-11-01 MED ORDER — CITRIC ACID-SODIUM CITRATE 334-500 MG/5ML PO SOLN
30.0000 mL | Freq: Once | ORAL | Status: DC
  Filled 2011-11-01: qty 15

## 2011-11-01 MED ORDER — GLYCOPYRROLATE 0.2 MG/ML IJ SOLN
INTRAMUSCULAR | Status: DC | PRN
  Administered 2011-11-01: 1 mg via INTRAVENOUS

## 2011-11-01 MED ORDER — FENTANYL CITRATE 0.05 MG/ML IJ SOLN
INTRAMUSCULAR | Status: AC
  Filled 2011-11-01: qty 2

## 2011-11-01 MED ORDER — NEOSTIGMINE METHYLSULFATE 1 MG/ML IJ SOLN
INTRAMUSCULAR | Status: AC
  Filled 2011-11-01: qty 10

## 2011-11-01 MED ORDER — SUCCINYLCHOLINE CHLORIDE 20 MG/ML IJ SOLN
INTRAMUSCULAR | Status: AC
  Filled 2011-11-01: qty 10

## 2011-11-01 MED ORDER — NEOSTIGMINE METHYLSULFATE 1 MG/ML IJ SOLN
INTRAMUSCULAR | Status: DC | PRN
  Administered 2011-11-01: 5 mg via INTRAVENOUS

## 2011-11-01 MED ORDER — SUCCINYLCHOLINE CHLORIDE 20 MG/ML IJ SOLN
INTRAMUSCULAR | Status: DC | PRN
  Administered 2011-11-01: 140 mg via INTRAVENOUS

## 2011-11-01 MED ORDER — EPHEDRINE SULFATE 50 MG/ML IJ SOLN
INTRAMUSCULAR | Status: DC | PRN
  Administered 2011-11-01 (×3): 10 mg via INTRAVENOUS

## 2011-11-01 MED ORDER — MIDAZOLAM HCL 2 MG/2ML IJ SOLN
INTRAMUSCULAR | Status: AC
  Filled 2011-11-01: qty 2

## 2011-11-01 MED ORDER — ONDANSETRON HCL 4 MG/2ML IJ SOLN
INTRAMUSCULAR | Status: AC
  Filled 2011-11-01: qty 2

## 2011-11-01 MED ORDER — SODIUM CHLORIDE 0.9 % IV BOLUS (SEPSIS): 500.0000 mL | Freq: Once | INTRAVENOUS | Status: DC

## 2011-11-01 MED ORDER — FENTANYL CITRATE 0.05 MG/ML IJ SOLN
INTRAMUSCULAR | Status: AC
  Filled 2011-11-01: qty 5

## 2011-11-01 MED ORDER — FENTANYL CITRATE 0.05 MG/ML IJ SOLN
25.0000 ug | INTRAMUSCULAR | Status: DC | PRN
  Administered 2011-11-01 (×4): 50 ug via INTRAVENOUS

## 2011-11-01 MED ORDER — PROPOFOL 10 MG/ML IV EMUL
INTRAVENOUS | Status: AC
  Filled 2011-11-01: qty 20

## 2011-11-01 MED ORDER — KCL-LACTATED RINGERS 20 MEQ/L IV SOLN
INTRAVENOUS | Status: DC
  Filled 2011-11-01 (×2): qty 1000

## 2011-11-01 MED ORDER — LACTATED RINGERS IV SOLN
INTRAVENOUS | Status: AC
  Administered 2011-11-01: 07:00:00 via INTRAVENOUS

## 2011-11-01 MED ORDER — MEPERIDINE HCL 25 MG/ML IJ SOLN
6.2500 mg | INTRAMUSCULAR | Status: DC | PRN
  Administered 2011-11-01: 6.25 mg via INTRAVENOUS

## 2011-11-01 MED ORDER — SODIUM CHLORIDE 0.9 % IJ SOLN: 3.0000 mL | Freq: Three times a day (TID) | INTRAMUSCULAR | Status: DC

## 2011-11-01 MED ORDER — LIDOCAINE HCL (CARDIAC) 20 MG/ML IV SOLN
INTRAVENOUS | Status: DC | PRN
  Administered 2011-11-01: 40 mg via INTRAVENOUS

## 2011-11-01 MED ORDER — ROCURONIUM BROMIDE 100 MG/10ML IV SOLN
INTRAVENOUS | Status: DC | PRN
  Administered 2011-11-01: 35 mg via INTRAVENOUS
  Administered 2011-11-01: 5 mg via INTRAVENOUS

## 2011-11-01 MED ORDER — DEXAMETHASONE SODIUM PHOSPHATE 4 MG/ML IJ SOLN
INTRAMUSCULAR | Status: DC | PRN
  Administered 2011-11-01: 10 mg via INTRAVENOUS

## 2011-11-01 MED ORDER — PROPOFOL 10 MG/ML IV EMUL
INTRAVENOUS | Status: DC | PRN
  Administered 2011-11-01: 170 mg via INTRAVENOUS

## 2011-11-01 SURGICAL SUPPLY — 33 items
CABLE HIGH FREQUENCY MONO STRZ (ELECTRODE) IMPLANT
CATH ROBINSON RED A/P 16FR (CATHETERS) IMPLANT
CHLORAPREP W/TINT 26ML (MISCELLANEOUS) IMPLANT
CLOTH BEACON ORANGE TIMEOUT ST (SAFETY) ×4 IMPLANT
CONTAINER PREFILL 10% NBF 60ML (FORM) IMPLANT
DECANTER SPIKE VIAL GLASS SM (MISCELLANEOUS) ×4 IMPLANT
DERMABOND ADVANCED (GAUZE/BANDAGES/DRESSINGS) ×1
DERMABOND ADVANCED .7 DNX12 (GAUZE/BANDAGES/DRESSINGS) ×3 IMPLANT
GLOVE BIO SURGEON STRL SZ8 (GLOVE) ×8 IMPLANT
GLOVE ORTHO TXT STRL SZ7.5 (GLOVE) ×8 IMPLANT
GOWN PREVENTION PLUS LG XLONG (DISPOSABLE) ×12 IMPLANT
NEEDLE EPID 17G 5 ECHO TUOHY (NEEDLE) IMPLANT
NEEDLE INSUFFLATION 14GA 120MM (NEEDLE) IMPLANT
NEEDLE SPNL 22GX3.5 QUINCKE BK (NEEDLE) ×4 IMPLANT
NS IRRIG 1000ML POUR BTL (IV SOLUTION) IMPLANT
PACK LAPAROSCOPY BASIN (CUSTOM PROCEDURE TRAY) IMPLANT
PACK LAVH (CUSTOM PROCEDURE TRAY) ×4 IMPLANT
PACK VAGINAL MINOR WOMEN LF (CUSTOM PROCEDURE TRAY) IMPLANT
PAD PREP 24X48 CUFFED NSTRL (MISCELLANEOUS) ×4 IMPLANT
POUCH SPECIMEN RETRIEVAL 10MM (ENDOMECHANICALS) IMPLANT
SCALPEL HARMONIC ACE (MISCELLANEOUS) IMPLANT
SET IRRIG TUBING LAPAROSCOPIC (IRRIGATION / IRRIGATOR) IMPLANT
SLEEVE Z-THREAD 5X100MM (TROCAR) IMPLANT
SOLUTION ELECTROLUBE (MISCELLANEOUS) IMPLANT
SUT VICRYL 0 UR6 27IN ABS (SUTURE) IMPLANT
SUT VICRYL 4-0 PS2 18IN ABS (SUTURE) ×4 IMPLANT
SYR CONTROL 10ML LL (SYRINGE) ×4 IMPLANT
TOWEL OR 17X24 6PK STRL BLUE (TOWEL DISPOSABLE) ×8 IMPLANT
TRAY FOLEY BAG SILVER LF 14FR (CATHETERS) IMPLANT
TROCAR Z-THREAD FIOS 11X100 BL (TROCAR) ×4 IMPLANT
TROCAR Z-THREAD FIOS 5X100MM (TROCAR) ×4 IMPLANT
WARMER LAPAROSCOPE (MISCELLANEOUS) IMPLANT
WATER STERILE IRR 1000ML POUR (IV SOLUTION) ×4 IMPLANT

## 2011-11-01 NOTE — Addendum Note (Signed)
Addendum  created 11/01/11 0804 by Cephus Shelling   Modules edited:Notes Section

## 2011-11-01 NOTE — Brief Op Note (Signed)
10/30/2011 - 11/01/2011  5:03 AM  PATIENT:  [name]  35 y.o. female  PRE-OPERATIVE DIAGNOSIS:  Post Vaginal Delivery; Vaginal Hemorrhage  POST-OPERATIVE DIAGNOSIS:  Bilateral Cervical Lacerations  PROCEDURE:  Procedure(s): Exam under anesthesia Repair of bilateral cervical lacerations Diagnostic laparoscopy  SURGEON:  Surgeon(s): Zenaida Niece, MD   ANESTHESIA:   general  EBL:  Total I/O In: 3080 [P.O.:480; I.V.:2600] Out: 900 [Urine:150; Blood:750]  BLOOD ADMINISTERED:none  DRAINS: Urinary Catheter (Foley)   LOCAL MEDICATIONS USED:  MARCAINE 10CC  SPECIMEN:  No Specimen  DISPOSITION OF SPECIMEN:  N/A  COUNTS:  YES  TOURNIQUET:  * No tourniquets in log *  DICTATION: .Other Dictation: Dictation Number [id number]  PLAN OF CARE: Transfer back to postpartum care  PATIENT DISPOSITION:  PACU - hemodynamically stable.

## 2011-11-01 NOTE — Progress Notes (Signed)
CTSP for bleeding She has passed 3 large clots when gets up to bathroom over the past few hours. Fundus firm, but at U+2 and deviated slightly to the right. SVE- 100cc clots in LUS expressed Bladder scanner with 400+cc urine in bladder Will give PO Methergine, insert foley, check CBC in am.  I think bleeding is from the uterus, but bleeding cervical laceration is still in differential.  If bleeding recurs may need to take to OR for exploration of vagina and cervix.

## 2011-11-01 NOTE — Anesthesia Procedure Notes (Signed)
Procedure Name: Intubation Date/Time: 11/01/2011 3:40 AM Performed by: Lincoln Brigham Pre-anesthesia Checklist: Patient identified, Patient being monitored, Emergency Drugs available, Timeout performed and Suction available Patient Re-evaluated:Patient Re-evaluated prior to inductionOxygen Delivery Method: Circle System Utilized Preoxygenation: Pre-oxygenation with 100% oxygen Intubation Type: IV induction, Rapid sequence and Cricoid Pressure applied Laryngoscope Size: Miller and 2 Grade View: Grade I Tube type: Oral Tube size: 7.0 mm Number of attempts: 1 Airway Equipment and Method: stylet Placement Confirmation: ETT inserted through vocal cords under direct vision,  breath sounds checked- equal and bilateral,  positive ETCO2 and CO2 detector Secured at: 21 cm Tube secured with: Tape Dental Injury: Teeth and Oropharynx as per pre-operative assessment

## 2011-11-01 NOTE — Plan of Care (Signed)
Problem: Discharge Progression Outcomes Goal: Barriers To Progression Addressed/Resolved Post partum bleed with large baseball size clots at 2200. Methergine series started. Foley catheter inserted.

## 2011-11-01 NOTE — Significant Event (Signed)
Rapid Response Event Note  Overview: Time Called: 0208 Arrival Time: 0210 Event Type: Other (Comment) (PP Hemorrhage)  Initial Focused Assessment:   Interventions:   Event Summary: Name of Physician Notified: Dr. Jackelyn Knife at [phone]  Name of Consulting Physician Notified: Dr. Jackelyn Knife at [phone]  Outcome: Other (Comment) (to OR for assessment/evaluation)  Event End Time: 0250  Jeannine Kitten

## 2011-11-01 NOTE — Significant Event (Signed)
Rapid Response Event Note  Overview: Time Called: 0208 Arrival Time: 0210 Event Type: Other (Comment) (PP Hemorrhage)  Initial Focused Assessment:   Interventions:   Event Summary: Name of Physician Notified: Dr. Meisinger at 0217  Name of Consulting Physician Notified: Dr. Meisinger at 0217  Outcome: Other (Comment) (to OR for assessment/evaluation)  Event End Time: 0250  [name] 

## 2011-11-01 NOTE — Anesthesia Postprocedure Evaluation (Signed)
  Anesthesia Post-op Note  Patient: [name]  Procedure(s) Performed:  LAPAROSCOPY OPERATIVE; DILATATION AND CURETTAGE  Patient Location: Mother/Baby  Anesthesia Type: General  Level of Consciousness: awake  Airway and Oxygen Therapy: Patient Spontanous Breathing  Post-op Pain: mild  Post-op Assessment: Post-op Vital signs reviewed  Post-op Vital Signs: stable  Complications: No apparent anesthesia complications

## 2011-11-01 NOTE — Op Note (Signed)
[name], [name]                  ACCOUNT NO.:  [id number]  MEDICAL RECORD NO.:  [id number]  LOCATION:  [facility]                          FACILITY:  [facility]  PHYSICIAN:  Zenaida Niece, M.D.DATE OF BIRTH:  July 04, 1976  DATE OF PROCEDURE:  11/01/2011 DATE OF DISCHARGE:                              OPERATIVE REPORT   PREOPERATIVE DIAGNOSIS:  Vaginal bleeding.  POSTOPERATIVE DIAGNOSIS:  Vaginal bleeding and bilateral cervical lacerations.  PROCEDURE:  Exam under anesthesia and repair of bilateral cervical lacerations followed by diagnostic laparoscopy.  SURGEON:  Zenaida Niece, MD  ANESTHESIA:  General endotracheal tube.  FINDINGS:  Vaginally, she had a 3-cm cervical laceration at 3 o'clock with fairly brisk bleeding and a 2-cm cervical laceration at 9 o'clock also with some bleeding.  The uterine fundus was firm.  There were no other significant bleeding from vaginal lacerations.  With laparoscopy, there was no intraperitoneal blood and the uterus and pelvic anatomy appeared normal for postpartum state with absent distal left fallopian tube.  ESTIMATED BLOOD LOSS:  450 mL including a 300-400 mL clot that came out of the vagina on prepping.  SPECIMENS:  None.  COMPLICATIONS:  None.  PROCEDURE IN DETAIL:  The patient was taken to the operating room, placed in the dorsal supine position.  General anesthesia was induced. Her left arm was placed to her side and legs were placed in mobile stirrups.  Abdomen, perineum, and vagina were then prepped and draped in the usual sterile fashion.  A Foley catheter had previously been placed. Again, while prepping the vagina, a 300-400 mL clot came out.  Again, she was draped in a sterile fashion.  The legs were elevated in stirrups.  A weighted speculum was inserted into the vagina.  A right angle retractor used to also expose the cervix.  Using ring forceps, I was able to walk around the cervix.  There was a 3-cm laceration at 3 o'clock  with fairly brisk bleeding.  This was repaired with running locking 0 Vicryl with adequate repair of the laceration and adequate hemostasis.  Small amount of bleeding at 5 o'clock from the cervix was also controlled with figure-of-eight sutures of 0 Vicryl.  A 2-cm cervical laceration at 9 o'clock was also repaired with running locking 0 Vicryl with adequate closure.  Extra figure-of-eight sutures were required to obtain hemostasis.  At this point, there was no significant vaginal bleeding noted.  The uterine fundus was firm and no clots were expressed.  I observed the vagina for a couple of minutes and again, there was no significant vaginal bleeding at this time.  Her previous labial laceration repair was intact.  At this time, I turned my attention abdominally.  The scrub tech and myself changed gowns and gloves.  Umbilical skin was infiltrated with 0.25% Marcaine and a 1-cm vertical incision was made just superior to the umbilicus.  A Veress needle was inserted into the peritoneal cavity and placement confirmed by an opening pressure of 3 mmHg.  CO2 was insufflated to a pressure of 12 mmHg.  A 10-11 disposable trocar was then introduced with direct visualization with the laparoscope.  A 5-mm port was placed on  the left side under direct visualization as well. Inspection revealed a normal postpartum uterus.  There was no intraperitoneal blood.  There was no evidence of any broad ligament hematoma or any evidence of a hematoma anywhere.  The 5-mm port was removed under direct visualization.  All gas was allowed to deflate from the abdomen and the umbilical trocar was removed.  The fascia was closed with a figure-of-eight suture of 0 Vicryl.  Skin incisions were then closed with interrupted subcuticular sutures of 4-0 Vicryl followed by Dermabond.  Legs were again raised in stirrups and vaginal inspection revealed no significant bleeding.  The patient was awakened in the operating  room and taken to the recovery room in stable condition after tolerating the procedure well.  Counts were correct and she had PAS hose on throughout the procedure.     Zenaida Niece, M.D.     TDM/MEDQ  D:  11/01/2011  T:  11/01/2011  Job:  [id number]

## 2011-11-01 NOTE — Progress Notes (Signed)
Post Partum Day 1 Subjective: pt with dizziness also abdominal pain.    Objective: Blood pressure 158/88, pulse 137, temperature 99 F (37.2 C), temperature source Oral, resp. rate 16, height 5\' 4"  (1.626 m), weight 70.308 kg (155 lb), SpO2 96.00%, unknown if currently breastfeeding.  Physical Exam:  General: alert and mild distress Lochia: appropriate Uterine Fundus: firm 1 below umbilicus Incision: healing well DVT Evaluation: No evidence of DVT seen on physical exam. CV RRR Lungs CTAB Abd soft, FFNT 1 below, mildly distended, + BS, TTP diffusely Ext sym, NT  Basename 11/01/11 1200 11/01/11 0535  HGB 6.9* 7.3*  HCT 20.2* 21.5*  Prepartum Hgb 11.7, Pt is orthostatic on exam  Assessment/Plan: 35 yo s/p SVD PPD #1 and dx l/s, cervical laceration repair POD #0 Pt with good uop, IV has infiltrated.  D/W pt Blood transfusion and hydrating with IVF also will continue foley for now.  Reassured pt and family.   LOS: 2 days   BOVARD,Daeton Kluth 11/01/2011, 1:56 PM

## 2011-11-01 NOTE — Significant Event (Addendum)
Rapid Response Event Note  Overview: Time Called: 0208 Arrival Time: 0210 Event Type: Other (Comment) (PP Hemorrhage)  Initial Focused Assessment:   Interventions:   Event Summary:  t Called to [facility] room 114 at 0208 to evaluate pt for pp bleeding. Hx vaginal delivery 12/26 at 1744.   In room at 0210, pt found to have saturated peri pad in past 30 minutes.  Abd. palpated taut, deviated to rt, extremely tender with pt stating pain 10/10.  Mod amt vaginal bleeding noted, no clots expressed.  VS obtained.  Foley patent with approx dk amber urine noted.  IV NS bolus started per protocol.  Report to Dr. Jackelyn Knife at [phone] - he will be in for further evaluation.  Labs obtained at 0045 - Hgb 7.9 (adm 11.7), Hct 23.4 (adm 34.0), platelets 134 (adm 280).  Pt typed and crossed for 2 units.   9604 - Saturated peri pad.  VS 139/87, 105, 24.  O2 sat 98%.   0230 VS:  146/94, 109.  0245 Dr. Jackelyn Knife at [facility].  Plan of care discussed - will take to OR for assessment under anesthesia.  Pt and husband verbalize agreement and understanding.  Surgical consent obtained.  VS 133/86, 108.   April Coe, RN and [place], [place] nurse at [facility] also.      at          [facility], [place]

## 2011-11-01 NOTE — Progress Notes (Signed)
PPD#1, POD#0 Sore back, some cramping, bleeding ok Afeb, VSS Abd- incisions ok, fundus firm Hgb 7.9 to 7.3, plt 130k to 80k Will d/c foley, ambulate, monitor for increased bleeding and continue routine care.  Will recheck CBC at 1200, hold Ibuprofen for now due to low platelets.

## 2011-11-01 NOTE — Transfer of Care (Signed)
Immediate Anesthesia Transfer of Care Note  Patient: Tabia Landowski  Procedure(s) Performed:  LAPAROSCOPY OPERATIVE; DILATATION AND CURETTAGE  Patient Location: PACU  Anesthesia Type: General  Level of Consciousness: awake, alert  and oriented  Airway & Oxygen Therapy: Patient Spontanous Breathing and Patient connected to nasal cannula oxygen  Post-op Assessment: Report given to PACU RN and Post -op Vital signs reviewed and stable  Post vital signs: Reviewed and stable  Complications: No apparent anesthesia complications

## 2011-11-01 NOTE — Anesthesia Preprocedure Evaluation (Addendum)
Anesthesia Evaluation  Patient identified by MRN, date of birth, ID band Patient awake    Reviewed: Allergy & Precautions, H&P , NPO status , Patient's Chart, lab work & pertinent test results  Airway Mallampati: I TM Distance: >3 FB Neck ROM: full    Dental No notable dental hx. (+) Teeth Intact   Pulmonary neg pulmonary ROS,  clear to auscultation  Pulmonary exam normal       Cardiovascular neg cardio ROS Regular Normal    Neuro/Psych  Headaches, PSYCHIATRIC DISORDERS Anxiety    GI/Hepatic negative GI ROS, Neg liver ROS,   Endo/Other  Negative Endocrine ROS  Renal/GU negative Renal ROS  Genitourinary negative   Musculoskeletal negative musculoskeletal ROS (+)   Abdominal Normal abdominal exam  (+)  Abdomen: tender.    Peds negative pediatric ROS (+)  Hematology negative hematology ROS (+)   Anesthesia Other Findings Abdomen distended. RLQ tenderness with guarding  Reproductive/Obstetrics negative OB ROS                          Anesthesia Physical  Anesthesia Plan  ASA: II and Emergent  Anesthesia Plan: General ETT and MAC   Post-op Pain Management:    Induction: Intravenous, Cricoid pressure planned and Rapid sequence  Airway Management Planned: Oral ETT  Additional Equipment:   Intra-op Plan:   Post-operative Plan: Extubation in OR  Informed Consent: I have reviewed the patients History and Physical, chart, labs and discussed the procedure including the risks, benefits and alternatives for the proposed anesthesia with the patient or authorized representative who has indicated his/her understanding and acceptance.   Dental Advisory Given and Dental advisory given  Plan Discussed with: Anesthesiologist, CRNA and Surgeon  Anesthesia Plan Comments: (Last ate 2145 food. )       Anesthesia Quick Evaluation

## 2011-11-01 NOTE — Progress Notes (Signed)
CTSP due to continued bleeding Despite foley and Methergine, still soaking a pad per hour, having increased pain, urine concentrated Afeb, VSS, now sl tachy 100-110s Fundus still at U+2 and to the right Hgb 7.9, down from 11.7 Will take to OR to explore vaginally for possible cervical laceration.  Will also probably do laparoscopy and possible laparotomy to look for intraabdominal bleeding.  T&C 2 u PRBC to have available.

## 2011-11-01 NOTE — Anesthesia Postprocedure Evaluation (Signed)
  Anesthesia Post-op Note  Patient: [name]  Procedure(s) Performed:  LAPAROSCOPY OPERATIVE; DILATATION AND CURETTAGE  Patient Location: PACU  Anesthesia Type: General  Level of Consciousness: awake, alert  and oriented  Airway and Oxygen Therapy: Patient Spontanous Breathing  Post-op Pain: mild  Post-op Assessment: Post-op Vital signs reviewed, Patient's Cardiovascular Status Stable, Respiratory Function Stable, Patent Airway, No signs of Nausea or vomiting and Pain level controlled  Post-op Vital Signs: Reviewed and stable  Complications: No apparent anesthesia complications

## 2011-11-02 LAB — TYPE AND SCREEN: Antibody Screen: NEGATIVE

## 2011-11-02 LAB — HEMOGLOBIN AND HEMATOCRIT, BLOOD
HCT: 21.9 % — ABNORMAL LOW (ref 36.0–46.0)
Hemoglobin: 7.4 g/dL — ABNORMAL LOW (ref 12.0–15.0)

## 2011-11-02 MED ORDER — OXYCODONE-ACETAMINOPHEN 5-325 MG PO TABS: 1.0000 | ORAL_TABLET | ORAL | Status: AC | PRN

## 2011-11-02 MED ORDER — IBUPROFEN 600 MG PO TABS: 600.0000 mg | ORAL_TABLET | Freq: Four times a day (QID) | ORAL | Status: AC

## 2011-11-02 NOTE — Progress Notes (Signed)
PPD#2 Received 2 units PRBC, feels better Afeb, VSS Fundus firm at U-1, incisions intact  Hemoglobin & Hematocrit     Component Value Date/Time   HGB 7.4* 11/02/2011 0445   HCT 21.9* 11/02/2011 0445   Will d/c foley and saline lock IV, reevaluate at lunch for discharge.

## 2011-11-02 NOTE — Discharge Summary (Signed)
Obstetric Discharge Summary Reason for Admission: induction of labor Prenatal Procedures: none Intrapartum Procedures: spontaneous vaginal delivery Postpartum Procedures: transfusion 2 units PRBC Complications-Operative and Postpartum: vaginal and cervical laceration, hemorrhage and taken to OR for persistent bleeding, cervical lacerations repaired, normal laparoscopy Hemoglobin  Date Value Range Status  11/02/2011 7.4* 12.0-15.0 (g/dL) Final     HCT  Date Value Range Status  11/02/2011 21.9* 36.0-46.0 (%) Final    Discharge Diagnoses: Term Pregnancy-delivered and postpartum hemorrhage with cervical lacerations  Discharge Information: Date: 11/02/2011 Activity: pelvic rest Diet: routine Medications: Ibuprofen and Percocet Condition: stable Instructions: refer to practice specific booklet Discharge to: home Follow-up Information    Follow up with Audry Pecina D, MD. Make an appointment in 6 weeks.   Contact information:   [address] [place] [address] [phone]          Newborn Data: Live born female  Birth Weight: 7 lb 2.6 oz (3249 g) APGAR: 9, 9  Home with mother.  Winnona Wargo D 11/02/2011, 1:22 PM

## 2011-11-02 NOTE — Progress Notes (Signed)
Feels ok Will d/c home

## 2011-11-02 NOTE — Progress Notes (Signed)
Informed Consent for blood transfusion was signed by patient.  Physician also needs to sign consent.  Form can be found in patients shadow chart.  Modesta Messing, RN

## 2011-11-05 ENCOUNTER — Encounter (HOSPITAL_COMMUNITY): Payer: Self-pay | Admitting: Obstetrics and Gynecology

## 2012-01-15 ENCOUNTER — Emergency Department: Payer: Self-pay | Admitting: *Deleted

## 2012-01-15 LAB — URINALYSIS, COMPLETE
Bilirubin,UR: NEGATIVE
Blood: NEGATIVE
Glucose,UR: NEGATIVE mg/dL (ref 0–75)
Leukocyte Esterase: NEGATIVE
RBC,UR: 1 /HPF (ref 0–5)
Squamous Epithelial: 9
WBC UR: 3 /HPF (ref 0–5)

## 2012-01-15 LAB — COMPREHENSIVE METABOLIC PANEL
BUN: 14 mg/dL (ref 7–18)
Bilirubin,Total: 0.3 mg/dL (ref 0.2–1.0)
Chloride: 101 mmol/L (ref 98–107)
Creatinine: 0.83 mg/dL (ref 0.60–1.30)
EGFR (Non-African Amer.): >60
Potassium: 3.6 mmol/L (ref 3.5–5.1)
Total Protein: 8.6 g/dL — ABNORMAL HIGH (ref 6.4–8.2)

## 2012-01-15 LAB — CBC
HCT: 42 % (ref 35.0–47.0)
HGB: 14 g/dL (ref 12.0–16.0)
MCH: 29.5 pg (ref 26.0–34.0)
MCHC: 33.3 g/dL (ref 32.0–36.0)
Platelet: 323 10*3/uL (ref 150–440)
WBC: 15 10*3/uL — ABNORMAL HIGH (ref 3.6–11.0)

## 2012-04-06 ENCOUNTER — Encounter (HOSPITAL_COMMUNITY): Payer: Self-pay | Admitting: Obstetrics and Gynecology

## 2012-05-17 ENCOUNTER — Encounter (HOSPITAL_COMMUNITY): Payer: Self-pay | Admitting: *Deleted

## 2012-05-17 ENCOUNTER — Emergency Department (HOSPITAL_COMMUNITY)
Admission: EM | Admit: 2012-05-17 | Discharge: 2012-05-18 | Disposition: A | Discharge status: Home / Self Care | Payer: 59 | Attending: Emergency Medicine | Admitting: Emergency Medicine

## 2012-05-17 ENCOUNTER — Emergency Department (HOSPITAL_COMMUNITY): Payer: 59

## 2012-05-17 DIAGNOSIS — Z79899 Other long term (current) drug therapy: Secondary | ICD-10-CM | POA: Insufficient documentation

## 2012-05-17 DIAGNOSIS — J40 Bronchitis, not specified as acute or chronic: Secondary | ICD-10-CM | POA: Insufficient documentation

## 2012-05-17 LAB — CBC WITH DIFFERENTIAL/PLATELET
Basophils Absolute: 0.1 10*3/uL (ref 0.0–0.1)
Lymphs Abs: 4.6 10*3/uL — ABNORMAL HIGH (ref 0.7–4.0)
MCV: 86.9 fL (ref 78.0–100.0)
Monocytes Relative: 7 % (ref 3–12)
Neutrophils Relative %: 61 % (ref 43–77)
Platelets: 436 10*3/uL — ABNORMAL HIGH (ref 150–400)
RDW: 13.4 % (ref 11.5–15.5)
WBC: 14.9 10*3/uL — ABNORMAL HIGH (ref 4.0–10.5)

## 2012-05-17 LAB — BASIC METABOLIC PANEL
BUN: 20 mg/dL (ref 6–23)
Creatinine, Ser: 0.76 mg/dL (ref 0.50–1.10)
GFR calc Af Amer: >90 mL/min (ref >90)
GFR calc non Af Amer: >90 mL/min (ref >90)
Potassium: 3.2 mEq/L — ABNORMAL LOW (ref 3.5–5.1)

## 2012-05-17 LAB — URINALYSIS, ROUTINE W REFLEX MICROSCOPIC
Bilirubin Urine: NEGATIVE
Ketones, ur: NEGATIVE mg/dL
Nitrite: NEGATIVE
Urobilinogen, UA: 0.2 mg/dL (ref 0.0–1.0)

## 2012-05-17 MED ORDER — ALBUTEROL SULFATE (5 MG/ML) 0.5% IN NEBU
5.0000 mg | INHALATION_SOLUTION | Freq: Once | RESPIRATORY_TRACT | Status: AC
  Administered 2012-05-17: 5 mg via RESPIRATORY_TRACT
  Filled 2012-05-17: qty 1

## 2012-05-17 MED ORDER — ALBUTEROL (5 MG/ML) CONTINUOUS INHALATION SOLN
10.0000 mg/h | INHALATION_SOLUTION | RESPIRATORY_TRACT | Status: DC
  Administered 2012-05-17: 10 mg/h via RESPIRATORY_TRACT

## 2012-05-17 MED ORDER — SODIUM CHLORIDE 0.9 % IV BOLUS (SEPSIS)
1000.0000 mL | Freq: Once | INTRAVENOUS | Status: AC
  Administered 2012-05-18: 1000 mL via INTRAVENOUS

## 2012-05-17 MED ORDER — SODIUM CHLORIDE 0.9 % IV BOLUS (SEPSIS)
1000.0000 mL | Freq: Once | INTRAVENOUS | Status: AC
  Administered 2012-05-17: 1000 mL via INTRAVENOUS

## 2012-05-17 MED ORDER — HYDROMORPHONE HCL PF 1 MG/ML IJ SOLN
1.0000 mg | Freq: Once | INTRAMUSCULAR | Status: AC
  Administered 2012-05-17: 1 mg via INTRAVENOUS
  Filled 2012-05-17: qty 1

## 2012-05-17 MED ORDER — HYDROMORPHONE HCL PF 1 MG/ML IJ SOLN
1.0000 mg | Freq: Once | INTRAMUSCULAR | Status: AC
  Administered 2012-05-18: 1 mg via INTRAVENOUS
  Filled 2012-05-17: qty 1

## 2012-05-17 MED ORDER — ALBUTEROL SULFATE (5 MG/ML) 0.5% IN NEBU
5.0000 mg | INHALATION_SOLUTION | Freq: Once | RESPIRATORY_TRACT | Status: AC
Start: 2012-05-17 — End: 2012-05-17
  Administered 2012-05-17: 5 mg via RESPIRATORY_TRACT
  Filled 2012-05-17: qty 1

## 2012-05-17 MED ORDER — HYDROCOD POLST-CHLORPHEN POLST 10-8 MG/5ML PO LQCR
5.0000 mL | Freq: Once | ORAL | Status: AC
  Administered 2012-05-17: 5 mL via ORAL
  Filled 2012-05-17: qty 5

## 2012-05-17 MED ORDER — MORPHINE SULFATE 4 MG/ML IJ SOLN
4.0000 mg | Freq: Once | INTRAMUSCULAR | Status: AC
  Administered 2012-05-17: 4 mg via INTRAVENOUS
  Filled 2012-05-17: qty 1

## 2012-05-17 NOTE — ED Provider Notes (Signed)
History     CSN: [id number]  Arrival date & time 05/17/12  4098   First MD Initiated Contact with Patient 05/17/12 2023      Chief Complaint  Patient presents with  . Pneumonia  . Headache  . Sinusitis    (Consider location/radiation/quality/duration/timing/severity/associated sxs/prior treatment) HPI Comments: Patient reports she has been sick for approximately one week with nonproductive cough, sore throat, nasal congestion, subjective fevers.  Was seen 5 days ago at her PCP and was given Levaquin, prednisone, and various other medications.  States she feels that the mucous in her chest is looser but she is otherwise not feeling better.  Reports feeling terrible with headache from coughing so much, chest tightness and soreness, left ear pressure.  Associated nausea.    Patient is a 36 y.o. female presenting with pneumonia, headaches, and sinusitis. The history is provided by the patient.  Pneumonia Associated symptoms include congestion, coughing, headaches and a sore throat. Pertinent negatives include no abdominal pain, fever, nausea or vomiting.  Headache  Associated symptoms include shortness of breath. Pertinent negatives include no fever, no nausea and no vomiting.  Sinusitis  Associated symptoms include congestion, sore throat, cough and shortness of breath.    Past Medical History  Diagnosis Date  . Kidney stone     stent and lithotrisy 2009  . UTI (urinary tract infection)     Past Surgical History  Procedure Date  . Laparoscopy for ectopic pregnancy 2008    mtx also  . Lithotripsy   . Cystoscopy/retrograde/ureteroscopy   . Laparoscopy 11/01/2011    Procedure: LAPAROSCOPY OPERATIVE;  Surgeon: Zenaida Niece, MD;  Location: [facility];  Service: Gynecology;  Laterality: N/A;  . Dilation and curettage of uterus 11/01/2011    Procedure: DILATATION AND CURETTAGE;  Surgeon: Zenaida Niece, MD;  Location: [facility];  Service: Gynecology;  Laterality: N/A;    Family  History  Problem Relation Age of Onset  . Cancer Maternal Grandmother     History  Substance Use Topics  . Smoking status: Never Smoker   . Smokeless tobacco: Not on file  . Alcohol Use: No    OB History    Grav Para Term Preterm Abortions TAB SAB Ect Mult Living   2 1 1  0 1 0 0 1 0 1      Review of Systems  Constitutional: Negative for fever.  HENT: Positive for congestion and sore throat. Negative for trouble swallowing.   Respiratory: Positive for cough, chest tightness and shortness of breath.   Gastrointestinal: Negative for nausea, vomiting, abdominal pain and diarrhea.  Genitourinary: Negative for dysuria, urgency, frequency and menstrual problem.  Neurological: Positive for headaches.    Allergies  Sulfa antibiotics and Prochlorperazine edisylate  Home Medications   Current Outpatient Rx  Name Route Sig Dispense Refill  . ACETAMINOPHEN 500 MG PO TABS Oral Take 500-1,000 mg by mouth every 6 (six) hours as needed. For pain    . ALBUTEROL SULFATE HFA 108 (90 BASE) MCG/ACT IN AERS Inhalation Inhale 2 puffs into the lungs every 6 (six) hours as needed. Shortness of breath    . ALPRAZOLAM 1 MG PO TABS Oral Take 1 mg by mouth daily as needed. Anxiety    . FLUTICASONE PROPIONATE 50 MCG/ACT NA SUSP Nasal Place 2 sprays into the nose daily.    Marland Kitchen LEVOFLOXACIN 500 MG PO TABS Oral Take 500 mg by mouth daily.    Marland Kitchen PREDNISONE (PAK) 5 MG PO TABS Oral Take 10 mg  by mouth See admin instructions. Follow package insert    . VITAMIN B-6 100 MG PO TABS Oral Take 100 mg by mouth daily.    Marland Kitchen VITAMIN B-12 100 MCG PO TABS Oral Take 50 mcg by mouth daily.      BP 126/85  Pulse 94  Temp 98.2 F (36.8 C) (Oral)  Resp 19  Ht 5\' 3"  (1.6 m)  Wt 133 lb (60.328 kg)  BMI 23.56 kg/m2  SpO2 99%  Breastfeeding? No  Physical Exam  Nursing note and vitals reviewed. Constitutional: She is oriented to person, place, and time. She appears well-developed and well-nourished. No distress.  HENT:    Head: Normocephalic and atraumatic.  Right Ear: Tympanic membrane and ear canal normal.  Left Ear: Tympanic membrane and ear canal normal.  Neck: Neck supple.  Cardiovascular: Normal rate, regular rhythm and normal heart sounds.   Pulmonary/Chest: No respiratory distress. She has decreased breath sounds. She has wheezes. She has no rales.       Diffuse inspiratory and expiratory wheezes in all fields.    Abdominal: Soft. Bowel sounds are normal. She exhibits no distension and no mass. There is no tenderness. There is no rebound and no guarding.  Neurological: She is alert and oriented to person, place, and time.  Skin: She is not diaphoretic.    ED Course  Procedures (including critical care time)  Labs Reviewed  CBC WITH DIFFERENTIAL - Abnormal; Notable for the following:    WBC 14.9 (*)     Hemoglobin 15.1 (*)     Platelets 436 (*)     Neutro Abs 9.2 (*)     Lymphs Abs 4.6 (*)     All other components within normal limits  BASIC METABOLIC PANEL - Abnormal; Notable for the following:    Sodium 134 (*)     Potassium 3.2 (*)     All other components within normal limits  URINALYSIS, ROUTINE W REFLEX MICROSCOPIC - Abnormal; Notable for the following:    Hgb urine dipstick LARGE (*)     Leukocytes, UA TRACE (*)     All other components within normal limits  URINE MICROSCOPIC-ADD ON - Abnormal; Notable for the following:    Squamous Epithelial / LPF FEW (*)     All other components within normal limits  PREGNANCY, URINE  URINE CULTURE   Dg Chest 2 View  05/17/2012  *RADIOLOGY REPORT*  Clinical Data: Cough, congestion.  CHEST - 2 VIEW  Comparison: 10/01/2011  Findings: No focal consolidation, pleural effusion, or pneumothorax.  The lungs are well expanded.  Cardiomediastinal contours are within normal range.  No acute osseous finding.  IMPRESSION: No radiographic evidence of acute cardiopulmonary process.  Original Report Authenticated By: Waneta Martins, M.D.   WBC elevated-  pt with obvious source of infection, also on prednisone.  Minor electrolyte abnormalities.  UA unremarkable.  CXR shoes no evidence of pneumonia or other acute process.    Discussed patient with Dr Rubin Payor.   11:32 PM Patient showing some improvement.  Now with only expiratory wheezes.  Headache improving.  Have ordered hour long neb and second dose of dilaudid.  Discussed all results with patient.    1:15 AM Patient improved after hour long neb treatment.  Continued expiratory wheezes but moving air well in all fields.  Upon further discussion, patient appears to have been given a prednisone dose pack starting with 25 mg as the highest dose.  Given this information, I have given her a  dose of solu medrol here and d/c home with prednisone taper starting with 60mg .  Pt to continue Levaquin and follow up with PCP.  Return precautions given.  Patient verbalizes understanding and agrees with plan.    1. Bronchitis       MDM  Patient with 7 days of upper respiratory symptoms, primarily cough, wheezing, chest tightness; now with headache from coughing.  Pt improved with neb treatments and pain medication in ED.  Tachycardic on last exam, but likely from albuterol (20mg  during visit).  Pt was on very low dose steroids from urgent care - have written prescription for full dose taper.  Pt to continue abx until gone, continue inhalers, I have prescribed tussionex and percocet for cough and pain.  Return precautions given.  Pt d/c home with PCP follow up.  Patient verbalizes understanding and agrees with plan.          Dillard Cannon [place], [place] 05/18/12 [phone]

## 2012-05-17 NOTE — ED Notes (Signed)
Pt was diagnosed with sinusitis, pneumonia,  Last week and placed on Levaquin,  Albuterol,  Prednisone,  Pt states she feels "malaise"  She is an employee in [place] and states there is something wrong,  She feels terribel

## 2012-05-18 MED ORDER — METHYLPREDNISOLONE SODIUM SUCC 125 MG IJ SOLR
125.0000 mg | Freq: Once | INTRAMUSCULAR | Status: AC
  Administered 2012-05-18: 125 mg via INTRAVENOUS
  Filled 2012-05-18: qty 2

## 2012-05-18 MED ORDER — METHYLPREDNISOLONE SODIUM SUCC 125 MG IJ SOLR: 125.0000 mg | Freq: Once | INTRAMUSCULAR | Status: DC

## 2012-05-18 MED ORDER — OXYCODONE-ACETAMINOPHEN 5-325 MG PO TABS: 1.0000 | ORAL_TABLET | ORAL | Status: AC | PRN

## 2012-05-18 MED ORDER — PREDNISONE (PAK) 10 MG PO TABS: 10.0000 mg | ORAL_TABLET | Freq: Every day | ORAL | Status: AC

## 2012-05-18 MED ORDER — HYDROCOD POLST-CHLORPHEN POLST 10-8 MG/5ML PO LQCR: 5.0000 mL | Freq: Two times a day (BID) | ORAL | Status: DC | PRN

## 2012-05-18 MED ORDER — HYDROMORPHONE HCL PF 1 MG/ML IJ SOLN
1.0000 mg | Freq: Once | INTRAMUSCULAR | Status: AC
  Administered 2012-05-18: 1 mg via INTRAVENOUS
  Filled 2012-05-18: qty 1

## 2012-05-19 LAB — URINE CULTURE: Colony Count: 65000

## 2012-05-20 NOTE — ED Notes (Signed)
+   Urine Chart sent to EDP office for review. 

## 2012-05-22 NOTE — ED Provider Notes (Signed)
Medical screening examination/treatment/procedure(s) were performed by non-physician practitioner and as supervising physician I was immediately available for consultation/collaboration.  Talayla Doyel R. Shalayah Beagley, MD 05/22/12 0028 

## 2012-10-06 ENCOUNTER — Emergency Department (HOSPITAL_BASED_OUTPATIENT_CLINIC_OR_DEPARTMENT_OTHER): Payer: 59

## 2012-10-06 ENCOUNTER — Emergency Department (HOSPITAL_BASED_OUTPATIENT_CLINIC_OR_DEPARTMENT_OTHER)
Admission: EM | Admit: 2012-10-06 | Discharge: 2012-10-06 | Disposition: A | Discharge status: Home / Self Care | Payer: 59 | Attending: Emergency Medicine | Admitting: Emergency Medicine

## 2012-10-06 ENCOUNTER — Encounter (HOSPITAL_BASED_OUTPATIENT_CLINIC_OR_DEPARTMENT_OTHER): Payer: Self-pay | Admitting: *Deleted

## 2012-10-06 DIAGNOSIS — R0602 Shortness of breath: Secondary | ICD-10-CM | POA: Insufficient documentation

## 2012-10-06 DIAGNOSIS — Z87442 Personal history of urinary calculi: Secondary | ICD-10-CM | POA: Insufficient documentation

## 2012-10-06 DIAGNOSIS — Z87440 Personal history of urinary (tract) infections: Secondary | ICD-10-CM | POA: Insufficient documentation

## 2012-10-06 DIAGNOSIS — IMO0001 Reserved for inherently not codable concepts without codable children: Secondary | ICD-10-CM | POA: Insufficient documentation

## 2012-10-06 DIAGNOSIS — R6883 Chills (without fever): Secondary | ICD-10-CM | POA: Insufficient documentation

## 2012-10-06 DIAGNOSIS — J40 Bronchitis, not specified as acute or chronic: Secondary | ICD-10-CM | POA: Insufficient documentation

## 2012-10-06 DIAGNOSIS — Z79899 Other long term (current) drug therapy: Secondary | ICD-10-CM | POA: Insufficient documentation

## 2012-10-06 DIAGNOSIS — Z9889 Other specified postprocedural states: Secondary | ICD-10-CM | POA: Insufficient documentation

## 2012-10-06 DIAGNOSIS — R062 Wheezing: Secondary | ICD-10-CM | POA: Insufficient documentation

## 2012-10-06 DIAGNOSIS — J3489 Other specified disorders of nose and nasal sinuses: Secondary | ICD-10-CM | POA: Insufficient documentation

## 2012-10-06 DIAGNOSIS — R079 Chest pain, unspecified: Secondary | ICD-10-CM | POA: Insufficient documentation

## 2012-10-06 LAB — CBC WITH DIFFERENTIAL/PLATELET
Basophils Absolute: 0 10*3/uL (ref 0.0–0.1)
Eosinophils Relative: 0 % (ref 0–5)
Lymphocytes Relative: 26 % (ref 12–46)
Lymphs Abs: 3.8 10*3/uL (ref 0.7–4.0)
MCV: 89.6 fL (ref 78.0–100.0)
Monocytes Relative: 3 % (ref 3–12)
Neutrophils Relative %: 71 % (ref 43–77)
Platelets: 504 10*3/uL — ABNORMAL HIGH (ref 150–400)
RBC: 4.31 MIL/uL (ref 3.87–5.11)
RDW: 13.8 % (ref 11.5–15.5)
WBC: 14.7 10*3/uL — ABNORMAL HIGH (ref 4.0–10.5)

## 2012-10-06 LAB — BASIC METABOLIC PANEL
CO2: 21 mEq/L (ref 19–32)
Chloride: 101 mEq/L (ref 96–112)
GFR calc Af Amer: >90 mL/min (ref >90)
Potassium: 3.6 mEq/L (ref 3.5–5.1)
Sodium: 137 mEq/L (ref 135–145)

## 2012-10-06 LAB — D-DIMER, QUANTITATIVE: D-Dimer, Quant: <0.27 ug/mL-FEU (ref 0.00–0.48)

## 2012-10-06 MED ORDER — ALBUTEROL SULFATE HFA 108 (90 BASE) MCG/ACT IN AERS: 2.0000 | INHALATION_SPRAY | RESPIRATORY_TRACT | Status: DC | PRN

## 2012-10-06 MED ORDER — HYDROMORPHONE HCL PF 1 MG/ML IJ SOLN
1.0000 mg | Freq: Once | INTRAMUSCULAR | Status: AC
  Administered 2012-10-06: 1 mg via INTRAVENOUS
  Filled 2012-10-06: qty 1

## 2012-10-06 MED ORDER — ALBUTEROL SULFATE (5 MG/ML) 0.5% IN NEBU
INHALATION_SOLUTION | RESPIRATORY_TRACT | Status: AC
  Filled 2012-10-06: qty 1

## 2012-10-06 MED ORDER — ALBUTEROL SULFATE (5 MG/ML) 0.5% IN NEBU
10.0000 mg | INHALATION_SOLUTION | Freq: Once | RESPIRATORY_TRACT | Status: AC
  Administered 2012-10-06: 10 mg via RESPIRATORY_TRACT
  Filled 2012-10-06: qty 2

## 2012-10-06 MED ORDER — IPRATROPIUM BROMIDE 0.02 % IN SOLN
0.5000 mg | Freq: Once | RESPIRATORY_TRACT | Status: AC
  Administered 2012-10-06: 0.5 mg via RESPIRATORY_TRACT

## 2012-10-06 MED ORDER — HYDROCOD POLST-CHLORPHEN POLST 10-8 MG/5ML PO LQCR
5.0000 mL | Freq: Once | ORAL | Status: AC
  Administered 2012-10-06: 5 mL via ORAL
  Filled 2012-10-06: qty 5

## 2012-10-06 MED ORDER — ALBUTEROL SULFATE (5 MG/ML) 0.5% IN NEBU
5.0000 mg | INHALATION_SOLUTION | Freq: Once | RESPIRATORY_TRACT | Status: AC
  Administered 2012-10-06: 5 mg via RESPIRATORY_TRACT

## 2012-10-06 MED ORDER — AZITHROMYCIN 250 MG PO TABS: ORAL_TABLET | ORAL | Status: DC

## 2012-10-06 MED ORDER — LORAZEPAM 1 MG PO TABS
1.0000 mg | ORAL_TABLET | Freq: Once | ORAL | Status: AC
  Administered 2012-10-06: 1 mg via ORAL
  Filled 2012-10-06: qty 1

## 2012-10-06 MED ORDER — IPRATROPIUM BROMIDE 0.02 % IN SOLN
RESPIRATORY_TRACT | Status: AC
  Filled 2012-10-06: qty 2.5

## 2012-10-06 MED ORDER — ONDANSETRON HCL 4 MG/2ML IJ SOLN
4.0000 mg | Freq: Once | INTRAMUSCULAR | Status: AC
  Administered 2012-10-06: 4 mg via INTRAVENOUS
  Filled 2012-10-06: qty 2

## 2012-10-06 MED ORDER — ACETAMINOPHEN 325 MG PO TABS
650.0000 mg | ORAL_TABLET | Freq: Four times a day (QID) | ORAL | Status: DC | PRN
  Administered 2012-10-06: 650 mg via ORAL
  Filled 2012-10-06 (×2): qty 2

## 2012-10-06 MED ORDER — HYDROCOD POLST-CHLORPHEN POLST 10-8 MG/5ML PO LQCR: 5.0000 mL | Freq: Two times a day (BID) | ORAL | Status: DC | PRN

## 2012-10-06 MED ORDER — SODIUM CHLORIDE 0.9 % IV SOLN
Freq: Once | INTRAVENOUS | Status: AC
  Administered 2012-10-06: 20:00:00 via INTRAVENOUS

## 2012-10-06 MED ORDER — METHYLPREDNISOLONE SODIUM SUCC 125 MG IJ SOLR
125.0000 mg | Freq: Once | INTRAMUSCULAR | Status: AC
  Administered 2012-10-06: 125 mg via INTRAVENOUS
  Filled 2012-10-06: qty 2

## 2012-10-06 NOTE — ED Notes (Signed)
PA at bedside now  

## 2012-10-06 NOTE — ED Notes (Signed)
Pt c/o uri symptoms x 6 days, UC x 4 days, no relief from prednisone and Flonase and amoxil

## 2012-10-06 NOTE — ED Provider Notes (Signed)
Medical screening examination/treatment/procedure(s) were performed by non-physician practitioner and as supervising physician I was immediately available for consultation/collaboration.   Charles B. Bernette Mayers, MD 10/06/12 2128

## 2012-10-06 NOTE — ED Provider Notes (Signed)
History     CSN: [id number]  Arrival date & time 10/06/12  1611   First MD Initiated Contact with Patient 10/06/12 1717      Chief Complaint  Patient presents with  . URI    (Consider location/radiation/quality/duration/timing/severity/associated sxs/prior treatment) Patient is a 36 y.o. female presenting with cough. The history is provided by the patient. No language interpreter was used.  Cough This is a new problem. The current episode started more than 1 week ago. The problem occurs constantly. The problem has been gradually worsening. The cough is productive of sputum. There has been no fever. Associated symptoms include chest pain, chills, rhinorrhea, myalgias, shortness of breath and wheezing. The treatment provided no relief. Risk factors: smoker in home. She is not a smoker. Her past medical history is significant for bronchitis and asthma.  Pt reports she is on amoxicillian, flonase, albuterol and prednisone.  Pt reports symptoms progressively worsening.  Past Medical History  Diagnosis Date  . Kidney stone     stent and lithotrisy 2009  . UTI (urinary tract infection)     Past Surgical History  Procedure Date  . Laparoscopy for ectopic pregnancy 2008    mtx also  . Lithotripsy   . Cystoscopy/retrograde/ureteroscopy   . Laparoscopy 11/01/2011    Procedure: LAPAROSCOPY OPERATIVE;  Surgeon: Zenaida Niece, MD;  Location: [facility];  Service: Gynecology;  Laterality: N/A;  . Dilation and curettage of uterus 11/01/2011    Procedure: DILATATION AND CURETTAGE;  Surgeon: Zenaida Niece, MD;  Location: [facility];  Service: Gynecology;  Laterality: N/A;    Family History  Problem Relation Age of Onset  . Cancer Maternal Grandmother     History  Substance Use Topics  . Smoking status: Never Smoker   . Smokeless tobacco: Not on file  . Alcohol Use: No    OB History    Grav Para Term Preterm Abortions TAB SAB Ect Mult Living   2 1 1  0 1 0 0 1 0 1      Review of  Systems  Constitutional: Positive for chills.  HENT: Positive for rhinorrhea.   Respiratory: Positive for cough, shortness of breath and wheezing.   Cardiovascular: Positive for chest pain.  Musculoskeletal: Positive for myalgias.  All other systems reviewed and are negative.    Allergies  Sulfa antibiotics and Prochlorperazine edisylate  Home Medications   Current Outpatient Rx  Name  Route  Sig  Dispense  Refill  . ACETAMINOPHEN 500 MG PO TABS   Oral   Take 500-1,000 mg by mouth every 6 (six) hours as needed. For pain         . ALBUTEROL SULFATE HFA 108 (90 BASE) MCG/ACT IN AERS   Inhalation   Inhale 2 puffs into the lungs every 6 (six) hours as needed. Shortness of breath         . ALPRAZOLAM 1 MG PO TABS   Oral   Take 1 mg by mouth daily as needed. Anxiety         . HYDROCOD POLST-CPM POLST ER 10-8 MG/5ML PO LQCR   Oral   Take 5 mLs by mouth every 12 (twelve) hours as needed (cough).   200 mL   0   . FLUTICASONE PROPIONATE 50 MCG/ACT NA SUSP   Nasal   Place 2 sprays into the nose daily.         Marland Kitchen LEVOFLOXACIN 500 MG PO TABS   Oral   Take 500 mg by mouth  daily.         Marland Kitchen PREDNISONE (PAK) 5 MG PO TABS   Oral   Take 10 mg by mouth See admin instructions. Follow package insert         . VITAMIN B-6 100 MG PO TABS   Oral   Take 100 mg by mouth daily.         Marland Kitchen VITAMIN B-12 100 MCG PO TABS   Oral   Take 50 mcg by mouth daily.           BP 147/95  Pulse 102  Temp 99 F (37.2 C)  Resp 20  Ht 5\' 3"  (1.6 m)  Wt 130 lb (58.968 kg)  BMI 23.03 kg/m2  SpO2 100%  Physical Exam  Nursing note and vitals reviewed. Constitutional: She is oriented to person, place, and time. She appears well-developed and well-nourished.  HENT:  Head: Normocephalic and atraumatic.  Nose: Nose normal.  Mouth/Throat: Oropharynx is clear and moist.  Eyes: Conjunctivae normal are normal. Pupils are equal, round, and reactive to light.  Neck: Normal range of  motion. Neck supple.  Cardiovascular: Normal rate, regular rhythm and normal heart sounds.   Pulmonary/Chest: She has wheezes. She exhibits tenderness.  Abdominal: Soft.  Musculoskeletal: Normal range of motion.  Neurological: She is alert and oriented to person, place, and time.  Skin: Skin is warm.  Psychiatric: She has a normal mood and affect.    ED Course  Procedures (including critical care time)   Labs Reviewed  CBC WITH DIFFERENTIAL  BASIC METABOLIC PANEL  D-DIMER, QUANTITATIVE   Dg Chest 2 View  10/06/2012  *RADIOLOGY REPORT*  Clinical Data: Upper respiratory infection  CHEST - 2 VIEW  Comparison: 05/17/2012  Findings: Cardiomediastinal silhouette is stable.  No acute infiltrate or pleural effusion.  No pulmonary edema.  Bony thorax is stable.  IMPRESSION: No active disease.  No significant change.  The   Original Report Authenticated By: Natasha Mead, M.D.      1. Bronchitis       MDM  Pt given albuterol neb with no relief.  Pt given 1 hour continous neb,   Solumedrol Iv.  Pt given ativan for anxiety,   Pt given rx for zithromax and tussionex.          Lonia Skinner [place], [place] 10/06/12 2124  Lonia Skinner [place], [place] 10/06/12 2125

## 2012-11-05 DIAGNOSIS — R569 Unspecified convulsions: Secondary | ICD-10-CM

## 2012-11-05 HISTORY — DX: Unspecified convulsions: R56.9

## 2013-07-30 ENCOUNTER — Emergency Department: Payer: Self-pay | Admitting: Emergency Medicine

## 2013-07-30 LAB — URINALYSIS, COMPLETE
Blood: NEGATIVE
Glucose,UR: NEGATIVE mg/dL (ref 0–75)
Hyaline Cast: 18
Ketone: NEGATIVE
Nitrite: POSITIVE
Ph: 5 (ref 4.5–8.0)
Protein: 30
RBC,UR: 4 /HPF (ref 0–5)
Specific Gravity: 1.026 (ref 1.003–1.030)
Squamous Epithelial: 7
WBC UR: 58 /HPF (ref 0–5)

## 2013-07-30 LAB — COMPREHENSIVE METABOLIC PANEL
Albumin: 3.5 g/dL (ref 3.4–5.0)
Alkaline Phosphatase: 80 U/L (ref 50–136)
BUN: 12 mg/dL (ref 7–18)
Bilirubin,Total: 0.3 mg/dL (ref 0.2–1.0)
EGFR (Non-African Amer.): >60
Glucose: 118 mg/dL — ABNORMAL HIGH (ref 65–99)
Potassium: 3.5 mmol/L (ref 3.5–5.1)
SGPT (ALT): 14 U/L (ref 12–78)
Total Protein: 7.5 g/dL (ref 6.4–8.2)

## 2013-07-30 LAB — CBC
HCT: 38.3 % (ref 35.0–47.0)
MCH: 30.7 pg (ref 26.0–34.0)
Platelet: 443 10*3/uL — ABNORMAL HIGH (ref 150–440)
RBC: 4.19 10*6/uL (ref 3.80–5.20)
RDW: 12.9 % (ref 11.5–14.5)
WBC: 12.6 10*3/uL — ABNORMAL HIGH (ref 3.6–11.0)

## 2013-07-30 LAB — DRUG SCREEN, URINE
Amphetamines, Ur Screen: NEGATIVE (ref ?–1000)
Benzodiazepine, Ur Scrn: POSITIVE (ref ?–200)
Cocaine Metabolite,Ur ~~LOC~~: NEGATIVE (ref ?–300)
MDMA (Ecstasy)Ur Screen: NEGATIVE (ref ?–500)
Methadone, Ur Screen: NEGATIVE (ref ?–300)
Opiate, Ur Screen: POSITIVE (ref ?–300)
Phencyclidine (PCP) Ur S: NEGATIVE (ref ?–25)

## 2014-09-06 ENCOUNTER — Encounter (HOSPITAL_BASED_OUTPATIENT_CLINIC_OR_DEPARTMENT_OTHER): Payer: Self-pay | Admitting: *Deleted

## 2014-11-03 ENCOUNTER — Emergency Department: Payer: Self-pay | Admitting: Emergency Medicine

## 2014-11-05 NOTE — L&D Delivery Note (Signed)
Obstetrical Delivery Note   Date of Delivery:   06/07/2015 at 1032 Primary OB:   [facility] Gestational Age/EDD: [redacted]w[redacted]d (Dated by10wk2d ultrasound) Antepartum complications: oligohydramnios, anxiety, hx of HSV, peripheral cord insertion  Delivered By:   Farrel Conners, CNM  Delivery Type:   spontaneous vaginal delivery  Procedure Details:   CTSP due to prolonged fetal heart rate deceleration. Cervix was completely dilated at that time and station +2. After fetal heart rate recovered, mother began pushing and delivered a viable 5#8.7oz female with good cry. Baby placed on mother's chest and the cord was clamped after pulsations stopped. FOB then cut the cord. Baby developed decreased O2 readings and was taken to NICU for evaluation Anesthesia:    epidural Intrapartum complications: meconium stained amniotic fluid GBS:    negative Laceration:    periurethral Episiotomy:    none Placenta:    Via active 3rd stage. To pathology: yes, possible velamentous insertion Estimated Blood Loss:   Baby:    Liveborn female, Apgars 8/8, weight 5#8.7 oz [name]/ Bottle    [name], CNM

## 2014-12-01 ENCOUNTER — Emergency Department (HOSPITAL_COMMUNITY): Payer: 59

## 2014-12-01 ENCOUNTER — Emergency Department (HOSPITAL_COMMUNITY)
Admission: EM | Admit: 2014-12-01 | Discharge: 2014-12-01 | Disposition: A | Discharge status: Home / Self Care | Payer: 59 | Attending: Emergency Medicine | Admitting: Emergency Medicine

## 2014-12-01 ENCOUNTER — Encounter (HOSPITAL_COMMUNITY): Payer: Self-pay | Admitting: Emergency Medicine

## 2014-12-01 DIAGNOSIS — M545 Low back pain, unspecified: Secondary | ICD-10-CM

## 2014-12-01 DIAGNOSIS — R52 Pain, unspecified: Secondary | ICD-10-CM

## 2014-12-01 DIAGNOSIS — Z87442 Personal history of urinary calculi: Secondary | ICD-10-CM | POA: Diagnosis not present

## 2014-12-01 DIAGNOSIS — G8929 Other chronic pain: Secondary | ICD-10-CM | POA: Diagnosis not present

## 2014-12-01 DIAGNOSIS — R110 Nausea: Secondary | ICD-10-CM | POA: Diagnosis not present

## 2014-12-01 DIAGNOSIS — R109 Unspecified abdominal pain: Secondary | ICD-10-CM | POA: Diagnosis present

## 2014-12-01 DIAGNOSIS — Z79899 Other long term (current) drug therapy: Secondary | ICD-10-CM | POA: Diagnosis not present

## 2014-12-01 DIAGNOSIS — Z792 Long term (current) use of antibiotics: Secondary | ICD-10-CM | POA: Insufficient documentation

## 2014-12-01 DIAGNOSIS — Z87440 Personal history of urinary (tract) infections: Secondary | ICD-10-CM | POA: Insufficient documentation

## 2014-12-01 DIAGNOSIS — Z3490 Encounter for supervision of normal pregnancy, unspecified, unspecified trimester: Secondary | ICD-10-CM

## 2014-12-01 DIAGNOSIS — Z331 Pregnant state, incidental: Secondary | ICD-10-CM | POA: Insufficient documentation

## 2014-12-01 HISTORY — DX: Radiculopathy, lumbar region: M54.16

## 2014-12-01 HISTORY — DX: Dorsalgia, unspecified: M54.9

## 2014-12-01 HISTORY — DX: Unspecified ectopic pregnancy without intrauterine pregnancy: O00.90

## 2014-12-01 HISTORY — DX: Other chronic pain: G89.29

## 2014-12-01 LAB — URINALYSIS, ROUTINE W REFLEX MICROSCOPIC
Bilirubin Urine: NEGATIVE
Glucose, UA: NEGATIVE mg/dL
Ketones, ur: NEGATIVE mg/dL
Nitrite: NEGATIVE
Protein, ur: NEGATIVE mg/dL
Specific Gravity, Urine: 1.017 (ref 1.005–1.030)
Urobilinogen, UA: 0.2 mg/dL (ref 0.0–1.0)
pH: 7 (ref 5.0–8.0)

## 2014-12-01 LAB — URINE MICROSCOPIC-ADD ON

## 2014-12-01 LAB — PREGNANCY, URINE: PREG TEST UR: POSITIVE — AB

## 2014-12-01 LAB — HCG, QUANTITATIVE, PREGNANCY: hCG, Beta Chain, Quant, S: 104258 m[IU]/mL — ABNORMAL HIGH (ref <5)

## 2014-12-01 LAB — ABO/RH: ABO/RH(D): O POS

## 2014-12-01 MED ORDER — KETOROLAC TROMETHAMINE 30 MG/ML IJ SOLN
30.0000 mg | Freq: Once | INTRAMUSCULAR | Status: AC
  Administered 2014-12-01: 30 mg via INTRAVENOUS
  Filled 2014-12-01: qty 1

## 2014-12-01 MED ORDER — HYDROMORPHONE HCL 1 MG/ML IJ SOLN
1.0000 mg | INTRAMUSCULAR | Status: DC | PRN
  Administered 2014-12-01: 1 mg via INTRAVENOUS
  Filled 2014-12-01: qty 1

## 2014-12-01 MED ORDER — ONDANSETRON HCL 4 MG/2ML IJ SOLN
4.0000 mg | Freq: Once | INTRAMUSCULAR | Status: AC
  Administered 2014-12-01: 4 mg via INTRAVENOUS
  Filled 2014-12-01: qty 2

## 2014-12-01 MED ORDER — MORPHINE SULFATE 4 MG/ML IJ SOLN
4.0000 mg | Freq: Once | INTRAMUSCULAR | Status: AC
  Administered 2014-12-01: 4 mg via INTRAVENOUS
  Filled 2014-12-01: qty 1

## 2014-12-01 MED ORDER — ONDANSETRON HCL 4 MG/2ML IJ SOLN
4.0000 mg | INTRAMUSCULAR | Status: DC | PRN
  Administered 2014-12-01: 4 mg via INTRAVENOUS
  Filled 2014-12-01: qty 2

## 2014-12-01 MED ORDER — OXYCODONE-ACETAMINOPHEN 5-325 MG PO TABS: 2.0000 | ORAL_TABLET | ORAL | Status: DC | PRN

## 2014-12-01 MED ORDER — METOCLOPRAMIDE HCL 5 MG/ML IJ SOLN
10.0000 mg | Freq: Once | INTRAMUSCULAR | Status: AC
  Administered 2014-12-01: 10 mg via INTRAVENOUS
  Filled 2014-12-01: qty 2

## 2014-12-01 MED ORDER — OXYCODONE-ACETAMINOPHEN 5-325 MG PO TABS
2.0000 | ORAL_TABLET | Freq: Once | ORAL | Status: AC
  Administered 2014-12-01: 2 via ORAL
  Filled 2014-12-01: qty 2

## 2014-12-01 MED ORDER — MORPHINE SULFATE 4 MG/ML IJ SOLN
8.0000 mg | INTRAMUSCULAR | Status: DC | PRN
  Administered 2014-12-01: 8 mg via INTRAVENOUS
  Filled 2014-12-01: qty 2

## 2014-12-01 NOTE — ED Provider Notes (Addendum)
Patient discussed with Dr. Clarene DukeMcManus. Care assumed. I have reviewed with the patient in detail, the results of her CT and her pelvic ultrasound. Has a quantitative hCG over 100,000. Patient continues to have episodes of pain. Has some tenderness in the left lower abdomen. Not a peritoneal abdomen. Differential diagnosis includes torsion of the ovary, nonvisualized left ureteral stone. She does have blood in her urine and no vaginal bleeding. I discussed the case with Dr. Ellyn HackBovard, on call for the patient's OB/GYN, Dr. Antony BlackbirdMesinger.  Patient is G3 P1. One prior live birth. One prior left ectopic pregnancy requiring left salpingectomy. Did not have oophorectomy.  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  We discussed the patient. She felt that it would be highly unlikely that a torsion of the ovary would not show a demonstrable ovary and the expectation would be dilated and abnormal. I discussed this at length with the patient. Plan is symptom control. Home with careful return precautions. Treatment as though this may be ureteral stone with pain medication. Hydration, and close follow-up. She will call her to OB/GYN if any worsening symptoms.  Rolland PorterMark Talicia Sui, MD 12/01/14 1933  Rolland PorterMark Maelle Sheaffer, MD 12/01/14 [phone]

## 2014-12-01 NOTE — ED Notes (Addendum)
Pt c/o left lower back pain radiating to left lower abdominal region. Has had nausea but no vomiting. Pain has been going on for approximately four hours. Still able to urinate but with some discomfort and says "all it is is little bits of urine." Awaiting MD. Contacted urology but says they were unable to see her. Hx 38 kidney stones. Took two 500 mg Aleve and a Percocet around 1145.

## 2014-12-01 NOTE — Discharge Instructions (Signed)
Return here, or [facility] with any worsening symptoms. Recheck with vaginal bleeding, worsening pain, nausea or unable to hold down medications.  Abdominal Pain Many things can cause abdominal pain. Usually, abdominal pain is not caused by a disease and will improve without treatment. It can often be observed and treated at home. Your health care provider will do a physical exam and possibly order blood tests and X-rays to help determine the seriousness of your pain. However, in many cases, more time must pass before a clear cause of the pain can be found. Before that point, your health care provider may not know if you need more testing or further treatment. HOME CARE INSTRUCTIONS  Monitor your abdominal pain for any changes. The following actions may help to alleviate any discomfort you are experiencing:  Only take over-the-counter or prescription medicines as directed by your health care provider.  Do not take laxatives unless directed to do so by your health care provider.  Try a clear liquid diet (broth, tea, or water) as directed by your health care provider. Slowly move to a bland diet as tolerated. SEEK MEDICAL CARE IF:  You have unexplained abdominal pain.  You have abdominal pain associated with nausea or diarrhea.  You have pain when you urinate or have a bowel movement.  You experience abdominal pain that wakes you in the night.  You have abdominal pain that is worsened or improved by eating food.  You have abdominal pain that is worsened with eating fatty foods.  You have a fever. SEEK IMMEDIATE MEDICAL CARE IF:   Your pain does not go away within 2 hours.  You keep throwing up (vomiting).  Your pain is felt only in portions of the abdomen, such as the right side or the left lower portion of the abdomen.  You pass bloody or black tarry stools. MAKE SURE YOU:  Understand these instructions.   Will watch your condition.   Will get  help right away if you are not doing well or get worse.  Document Released: 08/01/2005 Document Revised: 10/27/2013 Document Reviewed: 07/01/2013 [facility] Patient Information 2015 [employer], [place]. This information is not intended to replace advice given to you by your health care provider. Make sure you discuss any questions you have with your health care provider.

## 2014-12-01 NOTE — ED Notes (Signed)
Urine collected.

## 2014-12-01 NOTE — ED Provider Notes (Signed)
CSN: [id number]     Arrival date & time 12/01/14  1300 History   First MD Initiated Contact with Patient 12/01/14 1308     Chief Complaint  Patient presents with  . Flank Pain      HPI Pt was seen at 1305. Per pt, c/o sudden onset and persistence of constant left sided flank "pain" that began 4 hours PTA.  Pt describes the pain as "like my last kidney stone," and radiating into the left side of her abd.  Has been associated with nausea. Pt took a percocet and alleve approximately 1145 PTA.  Denies vaginal bleeding/discharge, no dysuria/hematuria, no abd pain, no vomiting/diarrhea, no black or blood in stools, no CP/SOB.    LMP 3 weeks ago Uro: Dr. Annabell Howells Past Medical History  Diagnosis Date  . Kidney stone     stent and lithotrisy 2009  . UTI (urinary tract infection)   . Chronic back pain   . Lumbar radiculopathy   . Ectopic pregnancy    Past Surgical History  Procedure Laterality Date  . Laparoscopy for ectopic pregnancy  2008    mtx also  . Lithotripsy    . Cystoscopy/retrograde/ureteroscopy    . Laparoscopy  11/01/2011    Procedure: LAPAROSCOPY OPERATIVE;  Surgeon: Zenaida Niece, MD;  Location: [facility];  Service: Gynecology;  Laterality: N/A;  . Dilation and curettage of uterus  11/01/2011    Procedure: DILATATION AND CURETTAGE;  Surgeon: Zenaida Niece, MD;  Location: [facility];  Service: Gynecology;  Laterality: N/A;   Family History  Problem Relation Age of Onset  . Cancer Maternal Grandmother    History  Substance Use Topics  . Smoking status: Never Smoker   . Smokeless tobacco: Not on file  . Alcohol Use: No   OB History    Gravida Para Term Preterm AB TAB SAB Ectopic Multiple Living   0 1 0 0 1 0 1     Review of Systems ROS: Statement: All systems negative except as marked or noted in the HPI; Constitutional: Negative for fever and chills. ; ; Eyes: Negative for eye pain, redness and discharge. ; ; ENMT: Negative for ear pain, hoarseness, nasal  congestion, sinus pressure and sore throat. ; ; Cardiovascular: Negative for chest pain, palpitations, diaphoresis, dyspnea and peripheral edema. ; ; Respiratory: Negative for cough, wheezing and stridor. ; ; Gastrointestinal: +nausea. Negative for vomiting, diarrhea, abdominal pain, blood in stool, hematemesis, jaundice and rectal bleeding. . ; ; Genitourinary: +left flank pain. Negative for dysuria and hematuria. ; ; Musculoskeletal: Negative for neck pain. Negative for swelling and trauma.; ; Skin: Negative for pruritus, rash, abrasions, blisters, bruising and skin lesion.; ; Neuro: Negative for headache, lightheadedness and neck stiffness. Negative for weakness, altered level of consciousness , altered mental status, extremity weakness, paresthesias, involuntary movement, seizure and syncope.      Allergies  Sulfa antibiotics; Tramadol; and Prochlorperazine edisylate  Home Medications   Prior to Admission medications   Medication Sig Start Date End Date Taking? Authorizing Provider  acetaminophen (TYLENOL) 500 MG tablet Take 500-1,000 mg by mouth every 6 (six) hours as needed. For pain   Yes Historical Provider, MD  albuterol (PROVENTIL HFA;VENTOLIN HFA) 108 (90 BASE) MCG/ACT inhaler Inhale 2 puffs into the lungs every 6 (six) hours as needed. Shortness of breath   Yes Historical Provider, MD  albuterol (PROVENTIL HFA;VENTOLIN HFA) 108 (90 BASE) MCG/ACT inhaler Inhale 2 puffs into the lungs every 4 (four) hours as  needed for wheezing. 10/06/12  Yes Lonia Skinner Sofia, PA-C  ALPRAZolam Prudy Feeler) 1 MG tablet Take 1 mg by mouth 4 (four) times daily as needed for anxiety. Anxiety   Yes Historical Provider, MD  naproxen (NAPROSYN) 500 MG tablet Take 500 mg by mouth every 6 (six) hours as needed for moderate pain.   Yes Historical Provider, MD  oxyCODONE-acetaminophen (PERCOCET) 10-325 MG per tablet Take 1 tablet by mouth every 4 (four) hours as needed for pain.   Yes Historical Provider, MD  amoxicillin  (AMOXIL) 875 MG tablet Take 875 mg by mouth 2 (two) times daily.    Historical Provider, MD  azithromycin (ZITHROMAX Z-PAK) 250 MG tablet 2 tablets 1st day then 1 tablet a day, days 2-5 Patient not taking: Reported on 12/01/2014 10/06/12   Elson Areas, PA-C  chlorpheniramine-HYDROcodone [facility]) 10-8 MG/5ML LQCR Take 5 mLs by mouth every 12 (twelve) hours as needed (cough). Patient not taking: Reported on 12/01/2014 05/18/12   Trixie Dredge, PA-C  chlorpheniramine-HYDROcodone [facility]) 10-8 MG/5ML LQCR Take 5 mLs by mouth every 12 (twelve) hours as needed. Patient not taking: Reported on 12/01/2014 10/06/12   Elson Areas, PA-C   BP 144/94 mmHg  Pulse 97  Temp(Src) 98 F (36.7 C) (Oral)  Resp 16  SpO2 100%  LMP 11/10/2014 Physical Exam  1310: Physical examination:  Nursing notes reviewed; Vital signs and O2 SAT reviewed;  Constitutional: Well developed, Well nourished, Well hydrated, Uncomfortable appearing.; Head:  Normocephalic, atraumatic; Eyes: EOMI, PERRL, No scleral icterus; ENMT: Mouth and pharynx normal, Mucous membranes moist; Neck: Supple, Full range of motion, No lymphadenopathy; Cardiovascular: Regular rate and rhythm, No murmur, rub, or gallop; Respiratory: Breath sounds clear & equal bilaterally, No rales, rhonchi, wheezes.  Speaking full sentences with ease, Normal respiratory effort/excursion; Chest: Nontender, Movement normal; Abdomen: Soft, Nontender, Nondistended, Normal bowel sounds; Genitourinary: No CVA tenderness; Spine:  No midline CS, TS, LS tenderness. +TTP left lumbar paraspinal muscles.;;  Extremities: Pulses normal, No tenderness, No edema, No calf edema or asymmetry.; Neuro: AA&Ox3, Major CN grossly intact.  Speech clear. No gross focal motor or sensory deficits in extremities.; Skin: Color normal, Warm, Dry.   ED Course  Procedures     EKG Interpretation None      MDM  MDM Reviewed: previous chart, nursing note and  vitals Reviewed previous: labs and CT scan Interpretation: labs and CT scan     Results for orders placed or performed during the hospital encounter of 12/01/14  Pregnancy, urine  Result Value Ref Range   Preg Test, Ur POSITIVE (A) NEGATIVE    Ct Abdomen Pelvis Wo Contrast 12/01/2014   CLINICAL DATA:  Initial encounter for flank pain  EXAM: CT ABDOMEN AND PELVIS WITHOUT CONTRAST  TECHNIQUE: Multidetector CT imaging of the abdomen and pelvis was performed following the standard protocol without IV contrast.  COMPARISON:  09/20/2010  FINDINGS: Lower chest:  Unremarkable.  Hepatobiliary: No focal abnormality in the liver on this study without intravenous contrast. No evidence for hepatomegaly. There is no evidence for gallstones, gallbladder wall thickening, or pericholecystic fluid. No intrahepatic or extrahepatic biliary dilation.  Pancreas: No focal mass lesion. No dilatation of the main duct. No intraparenchymal cyst. No peripancreatic edema.  Spleen: No splenomegaly. No focal mass lesion.  Adrenals/Urinary Tract: No adrenal nodule or mass. No evidence for kidney stones. No hydroureteronephrosis. No evidence for or ureteral stones. Bladder is decompressed without evidence for stones.  Stomach/Bowel: Stomach is nondistended. No gastric  wall thickening. No evidence of outlet obstruction. Duodenum is normally positioned as is the ligament of Treitz. No small bowel wall thickening. No small bowel dilatation. The appendix is normal. No gross colonic mass. No colonic wall thickening. No substantial diverticular change.  Vascular/Lymphatic: No abdominal aortic aneurysm. No gastrohepatic or hepatoduodenal ligament lymphadenopathy. No retroperitoneal lymphadenopathy. No evidence for pelvic sidewall lymphadenopathy.  Reproductive: He her wrists as a a large volume of fluid in the endometrial canal. There appears to be a fetus thumb within this fluid suggesting a represents an intrauterine gestational sac. Soft  tissue within the fluid is not well visualized in this could potentially represent intrauterine fluid with layering debris. No adnexal mass.  Other: No intraperitoneal free fluid.  Musculoskeletal: Bone windows reveal no worrisome lytic or sclerotic osseous lesions.  IMPRESSION: No acute findings in the abdomen or pelvis.  Findings most consistent with intrauterine gestation. Correlation with serum pregnancy test recommended.  I was not consulted on this study before the CT imaging was performed. At the time of interpretation, I discussed the findings of the study with Dr. Clarene DukeMcManus in the emergency department and Salley ScarletMark Toatley, Director of Radiology.   Electronically Signed   By: Kennith CenterEric  Mansell M.D.   On: 12/01/2014 14:52    1450:  T/C from Rads MD with above results. Pt informed of above: states she had a normal LMP 3 weeks ago and did not believe she was pregnant. Will obtain B-quant, ABO/Rh, and US pelvis. Pt is agreeable with this plan.   1600:  US and B-quant pending. Sign out to Dr. Fayrene FearingJames.   Samuel JesterKathleen Dearius Hoffmann, DO 12/01/14 1620

## 2015-01-06 LAB — OB RESULTS CONSOLE GC/CHLAMYDIA
Chlamydia: NEGATIVE
GC PROBE AMP, GENITAL: NEGATIVE

## 2015-01-06 LAB — OB RESULTS CONSOLE VARICELLA ZOSTER ANTIBODY, IGG: Varicella: IMMUNE

## 2015-04-14 LAB — OB RESULTS CONSOLE HIV ANTIBODY (ROUTINE TESTING): HIV: NONREACTIVE

## 2015-04-14 LAB — OB RESULTS CONSOLE RPR: RPR: NONREACTIVE

## 2015-05-04 ENCOUNTER — Inpatient Hospital Stay: Payer: 59 | Attending: Oncology | Admitting: Oncology

## 2015-05-04 ENCOUNTER — Inpatient Hospital Stay: Payer: 59

## 2015-05-04 VITALS — BP 111/69 | HR 92 | Temp 96.8°F | Resp 18 | Ht 64.57 in | Wt 123.7 lb

## 2015-05-04 DIAGNOSIS — Z809 Family history of malignant neoplasm, unspecified: Secondary | ICD-10-CM | POA: Diagnosis not present

## 2015-05-04 DIAGNOSIS — R5383 Other fatigue: Secondary | ICD-10-CM | POA: Insufficient documentation

## 2015-05-04 DIAGNOSIS — Z79899 Other long term (current) drug therapy: Secondary | ICD-10-CM | POA: Diagnosis not present

## 2015-05-04 DIAGNOSIS — M549 Dorsalgia, unspecified: Secondary | ICD-10-CM | POA: Insufficient documentation

## 2015-05-04 DIAGNOSIS — Z87440 Personal history of urinary (tract) infections: Secondary | ICD-10-CM | POA: Insufficient documentation

## 2015-05-04 DIAGNOSIS — O99019 Anemia complicating pregnancy, unspecified trimester: Secondary | ICD-10-CM | POA: Insufficient documentation

## 2015-05-04 DIAGNOSIS — R531 Weakness: Secondary | ICD-10-CM | POA: Insufficient documentation

## 2015-05-04 DIAGNOSIS — O99013 Anemia complicating pregnancy, third trimester: Secondary | ICD-10-CM

## 2015-05-04 DIAGNOSIS — Z87442 Personal history of urinary calculi: Secondary | ICD-10-CM | POA: Insufficient documentation

## 2015-05-04 DIAGNOSIS — G8929 Other chronic pain: Secondary | ICD-10-CM | POA: Diagnosis not present

## 2015-05-04 LAB — CBC WITH DIFFERENTIAL/PLATELET
Basophils Absolute: 0.1 10*3/uL (ref 0–0.1)
Basophils Relative: 1 %
Eosinophils Absolute: 0.1 10*3/uL (ref 0–0.7)
Eosinophils Relative: 1 %
HCT: 28.9 % — ABNORMAL LOW (ref 35.0–47.0)
Hemoglobin: 9.4 g/dL — ABNORMAL LOW (ref 12.0–16.0)
Lymphocytes Relative: 19 %
Lymphs Abs: 1.8 10*3/uL (ref 1.0–3.6)
MCH: 29.6 pg (ref 26.0–34.0)
MCHC: 32.6 g/dL (ref 32.0–36.0)
MCV: 91 fL (ref 80.0–100.0)
Monocytes Absolute: 0.6 10*3/uL (ref 0.2–0.9)
Monocytes Relative: 6 %
Neutro Abs: 7.3 10*3/uL — ABNORMAL HIGH (ref 1.4–6.5)
Neutrophils Relative %: 73 %
PLATELETS: 320 10*3/uL (ref 150–440)
RBC: 3.18 MIL/uL — ABNORMAL LOW (ref 3.80–5.20)
RDW: 15.8 % — ABNORMAL HIGH (ref 11.5–14.5)
WBC: 9.9 10*3/uL (ref 3.6–11.0)

## 2015-05-04 LAB — FERRITIN: Ferritin: 18 ng/mL (ref 11–307)

## 2015-05-04 LAB — FOLATE: Folate: 7.3 ng/mL (ref >5.9)

## 2015-05-04 LAB — IRON AND TIBC
Iron: 25 ug/dL — ABNORMAL LOW (ref 28–170)
SATURATION RATIOS: 5 % — AB (ref 10.4–31.8)
TIBC: 504 ug/dL — ABNORMAL HIGH (ref 250–450)
UIBC: 479 ug/dL

## 2015-05-04 LAB — VITAMIN B12: Vitamin B-12: 297 pg/mL (ref 180–914)

## 2015-05-04 NOTE — Progress Notes (Signed)
Patient is pregnant with due date on 06/24/15 and on her last lab check hemoglobin was 6.9 so they would like her to have evaluation by Dr. Orlie DakinFinnegan.  She does have some fatigue

## 2015-05-05 LAB — HAPTOGLOBIN: HAPTOGLOBIN: 119 mg/dL (ref 34–200)

## 2015-05-10 ENCOUNTER — Inpatient Hospital Stay: Payer: 59 | Attending: Oncology

## 2015-05-10 VITALS — BP 117/64 | HR 108 | Temp 96.5°F | Resp 18

## 2015-05-10 DIAGNOSIS — O99013 Anemia complicating pregnancy, third trimester: Secondary | ICD-10-CM | POA: Insufficient documentation

## 2015-05-10 DIAGNOSIS — Z79899 Other long term (current) drug therapy: Secondary | ICD-10-CM | POA: Insufficient documentation

## 2015-05-10 MED ORDER — SODIUM CHLORIDE 0.9 % IJ SOLN
3.0000 mL | Freq: Once | INTRAMUSCULAR | Status: DC | PRN
  Filled 2015-05-10: qty 10

## 2015-05-10 MED ORDER — SODIUM CHLORIDE 0.9 % IV SOLN
Freq: Once | INTRAVENOUS | Status: AC
  Administered 2015-05-10: 20 mL/h via INTRAVENOUS
  Filled 2015-05-10: qty 1000

## 2015-05-10 MED ORDER — HEPARIN SOD (PORK) LOCK FLUSH 100 UNIT/ML IV SOLN: 250.0000 [IU] | Freq: Once | INTRAVENOUS | Status: DC | PRN

## 2015-05-10 MED ORDER — SODIUM CHLORIDE 0.9 % IJ SOLN
10.0000 mL | INTRAMUSCULAR | Status: DC | PRN
  Filled 2015-05-10: qty 10

## 2015-05-10 MED ORDER — SODIUM CHLORIDE 0.9 % IV SOLN
510.0000 mg | Freq: Once | INTRAVENOUS | Status: AC
  Administered 2015-05-10: 510 mg via INTRAVENOUS
  Filled 2015-05-10: qty 17

## 2015-05-10 MED ORDER — ALTEPLASE 2 MG IJ SOLR: 2.0000 mg | Freq: Once | INTRAMUSCULAR | Status: DC | PRN

## 2015-05-10 MED ORDER — HEPARIN SOD (PORK) LOCK FLUSH 100 UNIT/ML IV SOLN: 500.0000 [IU] | Freq: Once | INTRAVENOUS | Status: DC | PRN

## 2015-05-13 NOTE — Progress Notes (Signed)
[facility]  [phone] [phone]  ID: Carla ButcherAmy L [name] OB: October 17, 1976  MR#: [id number]  QIO#:[id number]#:[id number]  Patient Care Team: No Pcp Per Patient as PCP - General (General Practice)  CHIEF COMPLAINT:  Chief Complaint  Patient presents with  . New Evaluation    anemia during pregnancy    INTERVAL HISTORY: Patient is a 39 year old female who was found to have a declining hemoglobin and is now in her third trimester of pregnancy. She feels weak and fatigued, but otherwise feels well. She has no neurologic complaints. She is gaining weight appropriately. She denies any chest pain or shortness of breath. She denies any nausea, vomiting, constipation, or diarrhea. She has no melanoma or hematochezia. She has no urinary complaints. Patient offers no further specific complaints.  REVIEW OF SYSTEMS:   Review of Systems  Constitutional: Positive for malaise/fatigue.  Respiratory: Negative.   Cardiovascular: Negative.   Gastrointestinal: Negative.   Neurological: Positive for weakness.    As per HPI. Otherwise, a complete review of systems is negatve.  PAST MEDICAL HISTORY: Past Medical History  Diagnosis Date  . Kidney stone     stent and lithotrisy 2009  . UTI (urinary tract infection)   . Chronic back pain   . Lumbar radiculopathy   . Ectopic pregnancy     PAST SURGICAL HISTORY: Past Surgical History  Procedure Laterality Date  . Laparoscopy for ectopic pregnancy  2008    mtx also  . Lithotripsy    . Cystoscopy/retrograde/ureteroscopy    . Laparoscopy  11/01/2011    Procedure: LAPAROSCOPY OPERATIVE;  Surgeon: Zenaida Nieceodd D Meisinger, MD;  Location: [facility];  Service: Gynecology;  Laterality: N/A;  . Dilation and curettage of uterus  11/01/2011    Procedure: DILATATION AND CURETTAGE;  Surgeon: Zenaida Nieceodd D Meisinger, MD;  Location: [facility];  Service: Gynecology;  Laterality: N/A;    FAMILY HISTORY Family History  Problem Relation Age of Onset  .  Cancer Maternal Grandmother        ADVANCED DIRECTIVES:    HEALTH MAINTENANCE: History  Substance Use Topics  . Smoking status: Never Smoker   . Smokeless tobacco: Not on file  . Alcohol Use: No     Colonoscopy:  PAP:  Bone density:  Lipid panel:  Allergies  Allergen Reactions  . Sulfa Antibiotics Hives and Itching  . Tramadol Other (See Comments)    seizures  . Prochlorperazine Edisylate Anxiety    tachycardia    Current Outpatient Prescriptions  Medication Sig Dispense Refill  . FLUoxetine (PROZAC) 20 MG capsule Take 20 mg by mouth daily.    . Prenatal Vit-Fe Fumarate-FA (PRENATAL MULTIVITAMIN) TABS tablet Take 1 tablet by mouth daily at 12 noon.     No current facility-administered medications for this visit.    OBJECTIVE: Filed Vitals:   05/04/15 1209  BP: 111/69  Pulse: 92  Temp: 96.8 F (36 C)  Resp: 18     Body mass index is 20.86 kg/(m^2).    ECOG FS:0 - Asymptomatic  General: Well-developed, well-nourished, no acute distress. Eyes: Pink conjunctiva, anicteric sclera. HEENT: Normocephalic, moist mucous membranes, clear oropharnyx. Lungs: Clear to auscultation bilaterally. Heart: Regular rate and rhythm. No rubs, murmurs, or gallops. Abdomen: Appears appropriate for gestational age. Musculoskeletal: No edema, cyanosis, or clubbing. Neuro: Alert, answering all questions appropriately. Cranial nerves grossly intact. Skin: No rashes or petechiae noted. Psych: Normal affect.   LAB RESULTS:  Lab Results  Component Value Date   NA 136 07/30/2013  K 3.5 07/30/2013   CL 104 07/30/2013   CO2 27 07/30/2013   GLUCOSE 118* 07/30/2013   BUN 12 07/30/2013   CREATININE 0.78 07/30/2013   CALCIUM 9.0 07/30/2013   PROT 7.5 07/30/2013   ALBUMIN 3.5 07/30/2013   AST 15 07/30/2013   ALT 14 07/30/2013   ALKPHOS 80 07/30/2013   BILITOT 0.2* 10/01/2011   GFRNONAA >60 07/30/2013   GFRAA >60 07/30/2013    Lab Results  Component Value Date   WBC 9.9  05/04/2015   NEUTROABS 7.3* 05/04/2015   HGB 9.4* 05/04/2015   HCT 28.9* 05/04/2015   MCV 91.0 05/04/2015   PLT 320 05/04/2015     STUDIES: No results found.  ASSESSMENT: Iron deficiency anemia in pregnancy.  PLAN:    1. Iron deficiency anemia: Patient's hemoglobin and iron stores are decreased, and she is symptomatic. She will benefit from 500 mg IV Feraheme. Return to clinic in 1 and 2 weeks for treatment. Patient will then return to clinic the first week of August prior to her due date for repeat laboratory work and further evaluation. Also plan on checking patient's laboratory work approximately 2-3 months postpartum.  Patient expressed understanding and was in agreement with this plan. She also understands that She can call clinic at any time with any questions, concerns, or complaints.    Jeralyn Ruths, MD   05/13/2015 8:57 AM

## 2015-05-15 ENCOUNTER — Inpatient Hospital Stay (HOSPITAL_COMMUNITY): Payer: 59

## 2015-05-15 ENCOUNTER — Encounter (HOSPITAL_COMMUNITY): Payer: Self-pay

## 2015-05-15 ENCOUNTER — Inpatient Hospital Stay (HOSPITAL_COMMUNITY)
Admission: AD | Admit: 2015-05-15 | Discharge: 2015-05-15 | Disposition: A | Discharge status: Home / Self Care | Payer: 59 | Source: Ambulatory Visit | Attending: Obstetrics & Gynecology | Admitting: Obstetrics & Gynecology

## 2015-05-15 DIAGNOSIS — Z3A34 34 weeks gestation of pregnancy: Secondary | ICD-10-CM | POA: Insufficient documentation

## 2015-05-15 DIAGNOSIS — O479 False labor, unspecified: Secondary | ICD-10-CM | POA: Insufficient documentation

## 2015-05-15 DIAGNOSIS — M62830 Muscle spasm of back: Secondary | ICD-10-CM | POA: Diagnosis not present

## 2015-05-15 DIAGNOSIS — O4700 False labor before 37 completed weeks of gestation, unspecified trimester: Secondary | ICD-10-CM | POA: Insufficient documentation

## 2015-05-15 DIAGNOSIS — O368190 Decreased fetal movements, unspecified trimester, not applicable or unspecified: Secondary | ICD-10-CM | POA: Insufficient documentation

## 2015-05-15 DIAGNOSIS — R52 Pain, unspecified: Secondary | ICD-10-CM

## 2015-05-15 DIAGNOSIS — O4703 False labor before 37 completed weeks of gestation, third trimester: Secondary | ICD-10-CM

## 2015-05-15 LAB — URINALYSIS, ROUTINE W REFLEX MICROSCOPIC
BILIRUBIN URINE: NEGATIVE
Glucose, UA: NEGATIVE mg/dL
Hgb urine dipstick: NEGATIVE
Ketones, ur: NEGATIVE mg/dL
NITRITE: NEGATIVE
PROTEIN: NEGATIVE mg/dL
SPECIFIC GRAVITY, URINE: 1.02 (ref 1.005–1.030)
UROBILINOGEN UA: 0.2 mg/dL (ref 0.0–1.0)
pH: 6 (ref 5.0–8.0)

## 2015-05-15 LAB — URINE MICROSCOPIC-ADD ON

## 2015-05-15 LAB — CBC
HCT: 32.3 % — ABNORMAL LOW (ref 36.0–46.0)
Hemoglobin: 10.6 g/dL — ABNORMAL LOW (ref 12.0–15.0)
MCH: 30.1 pg (ref 26.0–34.0)
MCHC: 32.8 g/dL (ref 30.0–36.0)
MCV: 91.8 fL (ref 78.0–100.0)
Platelets: 368 10*3/uL (ref 150–400)
RBC: 3.52 MIL/uL — ABNORMAL LOW (ref 3.87–5.11)
RDW: 15.2 % (ref 11.5–15.5)
WBC: 15.7 10*3/uL — ABNORMAL HIGH (ref 4.0–10.5)

## 2015-05-15 MED ORDER — HYDROMORPHONE HCL 1 MG/ML IJ SOLN
1.0000 mg | Freq: Once | INTRAMUSCULAR | Status: AC
  Administered 2015-05-15: 1 mg via INTRAMUSCULAR
  Filled 2015-05-15: qty 1

## 2015-05-15 MED ORDER — NIFEDIPINE 10 MG PO CAPS
10.0000 mg | ORAL_CAPSULE | Freq: Once | ORAL | Status: AC
  Administered 2015-05-15: 10 mg via ORAL
  Filled 2015-05-15: qty 1

## 2015-05-15 MED ORDER — NIFEDIPINE 10 MG PO CAPS
10.0000 mg | ORAL_CAPSULE | Freq: Three times a day (TID) | ORAL | Status: DC | PRN
Start: 2015-05-15 — End: 2015-05-15

## 2015-05-15 MED ORDER — BUPIVACAINE HCL 0.5 % IJ SOLN
10.0000 mL | Freq: Once | INTRAMUSCULAR | Status: DC
  Filled 2015-05-15: qty 10

## 2015-05-15 MED ORDER — CYCLOBENZAPRINE HCL 5 MG PO TABS: 5.0000 mg | ORAL_TABLET | Freq: Three times a day (TID) | ORAL | Status: DC | PRN

## 2015-05-15 MED ORDER — NIFEDIPINE 10 MG PO CAPS: 10.0000 mg | ORAL_CAPSULE | Freq: Three times a day (TID) | ORAL | Status: DC | PRN

## 2015-05-15 MED ORDER — LACTATED RINGERS IV SOLN
INTRAVENOUS | Status: DC
  Administered 2015-05-15 (×2): via INTRAVENOUS

## 2015-05-15 MED ORDER — BUPIVACAINE HCL (PF) 0.5 % IJ SOLN
30.0000 mL | Freq: Once | INTRAMUSCULAR | Status: AC
  Administered 2015-05-15: 30 mL
  Filled 2015-05-15: qty 30

## 2015-05-15 MED ORDER — NIFEDIPINE 10 MG PO CAPS
10.0000 mg | ORAL_CAPSULE | Freq: Once | ORAL | Status: AC
Start: 2015-05-15 — End: 2015-05-15
  Administered 2015-05-15: 10 mg via ORAL
  Filled 2015-05-15: qty 1

## 2015-05-15 MED ORDER — LACTATED RINGERS IV BOLUS (SEPSIS)
1000.0000 mL | Freq: Once | INTRAVENOUS | Status: AC
  Administered 2015-05-15: 1000 mL via INTRAVENOUS

## 2015-05-15 MED ORDER — BUPIVACAINE HCL (PF) 0.5 % IJ SOLN
10.0000 mL | Freq: Once | INTRAMUSCULAR | Status: DC
  Filled 2015-05-15: qty 10

## 2015-05-15 NOTE — MAU Note (Addendum)
Pharmacy called to profile medication

## 2015-05-15 NOTE — Discharge Instructions (Signed)

## 2015-05-15 NOTE — Progress Notes (Signed)
Notified that pt was requesting to know the plan of care and requested that CNM review FHR tracing. Will call attending about pt pain level

## 2015-05-15 NOTE — MAU Note (Signed)
Pt presents complaining of L flank pain that started last night and got no relief with tylenol. Hx of kidney stones. Denies vaginal bleeding or discharge. Gets care in [place]. Was at work at [employer] and was sent here. Reports good fetal movement.

## 2015-05-15 NOTE — MAU Provider Note (Signed)
History     CSN: [id number]  Arrival date and time: 05/15/15 1349   First Provider Initiated Contact with Patient 05/15/15 1453      Chief Complaint  Patient presents with  . Flank Pain   HPI 39 y.o. G3P1011 @[redacted]w[redacted]d  presents to the MAU with complaint of contractions and left flank pain.She reports a history of kidney stones. Denies vaginal bleeding, LOF. Reports positive fetal movement.   Past Medical History  Diagnosis Date  . Kidney stone     stent and lithotrisy 2009  . UTI (urinary tract infection)   . Chronic back pain   . Lumbar radiculopathy   . Ectopic pregnancy     Past Surgical History  Procedure Laterality Date  . Laparoscopy for ectopic pregnancy  2008    mtx also  . Lithotripsy    . Cystoscopy/retrograde/ureteroscopy    . Laparoscopy  11/01/2011    Procedure: LAPAROSCOPY OPERATIVE;  Surgeon: Zenaida Nieceodd D Meisinger, MD;  Location: [facility];  Service: Gynecology;  Laterality: N/A;  . Dilation and curettage of uterus  11/01/2011    Procedure: DILATATION AND CURETTAGE;  Surgeon: Zenaida Nieceodd D Meisinger, MD;  Location: [facility];  Service: Gynecology;  Laterality: N/A;    Family History  Problem Relation Age of Onset  . Cancer Maternal Grandmother     History  Substance Use Topics  . Smoking status: Never Smoker   . Smokeless tobacco: Not on file  . Alcohol Use: No    Allergies:  Allergies  Allergen Reactions  . Sulfa Antibiotics Hives and Itching  . Tramadol Other (See Comments)    seizures  . Prochlorperazine Edisylate Anxiety    tachycardia    Prescriptions prior to admission  Medication Sig Dispense Refill Last Dose  . acetaminophen (TYLENOL) 500 MG tablet Take 500 mg by mouth every 6 (six) hours as needed.   05/15/2015 at Unknown time  . FLUoxetine (PROZAC) 20 MG capsule Take 20 mg by mouth daily.   Past Week at Unknown time  . Prenatal Vit-Fe Fumarate-FA (PRENATAL MULTIVITAMIN) TABS tablet Take 1 tablet by mouth daily at 12 noon.   05/15/2015 at Unknown time     Review of Systems  Constitutional: Negative for fever.  Gastrointestinal: Positive for abdominal pain. Negative for vomiting.  Musculoskeletal: Positive for back pain.   Physical Exam   Blood pressure 125/89, pulse 117, temperature 98.2 F (36.8 C), temperature source Oral, resp. rate 20, height 5\' 4"  (1.626 m), weight 57.153 kg (126 lb), last menstrual period 11/10/2014, SpO2 100 %.  Physical Exam  Nursing note and vitals reviewed. Constitutional: She is oriented to person, place, and time. She appears well-developed and well-nourished. No distress.  HENT:  Head: Normocephalic and atraumatic.  Neck: Normal range of motion.  Cardiovascular: Normal rate.   Respiratory: Effort normal. No respiratory distress.  GI: Soft. There is no tenderness.  Genitourinary: Vagina normal.  Musculoskeletal: Normal range of motion. She exhibits no edema.  Neurological: She is alert and oriented to person, place, and time.  Skin: Skin is warm and dry. No rash noted. No erythema. No pallor.  Psychiatric: She has a normal mood and affect. Her behavior is normal. Judgment and thought content normal.    MAU Course  Procedures [place] Renal  05/15/2015   CLINICAL DATA:  Left lower quadrant/ left flank pain, [redacted] weeks pregnant  EXAM: RENAL / URINARY TRACT ULTRASOUND COMPLETE  COMPARISON:  CT abdomen pelvis dated 12/01/2014  FINDINGS: Right Kidney:  Length: 12.0 cm. Echogenicity within  normal limits. No mass or hydronephrosis visualized.  Left Kidney:  Length: 1.5 cm. Echogenicity within normal limits. Mild to moderate hydronephrosis.  Bladder:  Within normal limits.  Bilateral bladder jets are present.  IMPRESSION: Mild to moderate left hydronephrosis.  Bilateral bladder jets are visualized.   Electronically Signed   By: Charline Bills M.D.   On: 05/15/2015 18:25  BPP- 6-8 MDM IV fluid bolus . FHR category 1 contracting every 1-2 minutes. Cervix is closed. Left paraspinous trigger point injection per Dr  Despina Hidden.Flexeril and Procardia.  Assessment and Plan  Preterm Contractions Back Spasm Keep next regularly scheduled appt Discharge home  [name] 05/15/2015, 3:09 PM

## 2015-05-15 NOTE — Progress Notes (Signed)
Called to notify of pt in MAU. CNM requested that MAU provider see pt if available.

## 2015-05-15 NOTE — Progress Notes (Signed)
Notified of u/s results and requested provider to review strip. Will watch pt longer

## 2015-05-15 NOTE — Progress Notes (Signed)
Requested provider review strip due to non reactive tracing and early deceleration

## 2015-05-15 NOTE — Progress Notes (Signed)
Called to notify of CNM request to see pt if possible. Will come see pt

## 2015-05-16 DIAGNOSIS — Z3A34 34 weeks gestation of pregnancy: Secondary | ICD-10-CM | POA: Insufficient documentation

## 2015-05-16 DIAGNOSIS — O4700 False labor before 37 completed weeks of gestation, unspecified trimester: Secondary | ICD-10-CM | POA: Insufficient documentation

## 2015-05-16 DIAGNOSIS — O479 False labor, unspecified: Secondary | ICD-10-CM | POA: Insufficient documentation

## 2015-05-16 DIAGNOSIS — O368190 Decreased fetal movements, unspecified trimester, not applicable or unspecified: Secondary | ICD-10-CM | POA: Insufficient documentation

## 2015-05-17 ENCOUNTER — Inpatient Hospital Stay: Payer: 59

## 2015-05-17 DIAGNOSIS — O99013 Anemia complicating pregnancy, third trimester: Secondary | ICD-10-CM

## 2015-05-17 MED ORDER — SODIUM CHLORIDE 0.9 % IV SOLN
510.0000 mg | Freq: Once | INTRAVENOUS | Status: AC
  Administered 2015-05-17: 510 mg via INTRAVENOUS
  Filled 2015-05-17: qty 17

## 2015-05-17 MED ORDER — SODIUM CHLORIDE 0.9 % IV SOLN
Freq: Once | INTRAVENOUS | Status: AC
  Administered 2015-05-17: 14:00:00 via INTRAVENOUS
  Filled 2015-05-17: qty 1000

## 2015-05-20 ENCOUNTER — Observation Stay
Admission: EM | Admit: 2015-05-20 | Discharge: 2015-05-20 | Disposition: A | Discharge status: Home / Self Care | Payer: 59 | Attending: Obstetrics and Gynecology | Admitting: Obstetrics and Gynecology

## 2015-05-20 DIAGNOSIS — O4700 False labor before 37 completed weeks of gestation, unspecified trimester: Secondary | ICD-10-CM | POA: Diagnosis present

## 2015-05-20 DIAGNOSIS — Z3A34 34 weeks gestation of pregnancy: Secondary | ICD-10-CM | POA: Insufficient documentation

## 2015-05-20 HISTORY — DX: Anemia, unspecified: D64.9

## 2015-05-20 LAB — URINALYSIS COMPLETE WITH MICROSCOPIC (ARMC ONLY)
Bilirubin Urine: NEGATIVE
GLUCOSE, UA: NEGATIVE mg/dL
HGB URINE DIPSTICK: NEGATIVE
NITRITE: NEGATIVE
PH: 6 (ref 5.0–8.0)
Protein, ur: NEGATIVE mg/dL
Specific Gravity, Urine: 1.02 (ref 1.005–1.030)

## 2015-05-20 LAB — FETAL FIBRONECTIN: Fetal Fibronectin: POSITIVE — AB

## 2015-05-20 NOTE — OB Triage Note (Signed)
Dr. Darci NeedleSteabler in,  situation reviewed.  Heating pad for flank pain.   Actions to relieve constipation - fiber supplements or Miralax if needed.  EFM off. Patient to dc.

## 2015-05-20 NOTE — H&P (Signed)
Obstetric H&P   Chief Complaint: Contractions  Prenatal Care Provider: [facility]  History of Present Illness: 39 y.o. G3P1011 [redacted]w[redacted]d by 06/27/2015 presenting with contractions/left flank pain.  Was seen on L&D at [facility] July 10th for threatened preterm labor and started on procardia. Evaluation there including renal ultrasound normal other than some pregnancy associated mild to moderate left hydronephrosis.      Growth scan today at [redacted]w[redacted]d EFW of 2291g (5lbs 1oz) c/w 32.3%ile, AFI 13.67cm. ABO, Rh: --/--/O POS (01/27 1459)   Review of Systems: 10 point review of systems negative unless otherwise noted in HPI  Past Medical History: Past Medical History  Diagnosis Date  . Kidney stone     stent and lithotrisy 2009  . UTI (urinary tract infection)   . Chronic back pain   . Lumbar radiculopathy   . Ectopic pregnancy   . Anemia     Past Surgical History: Past Surgical History  Procedure Laterality Date  . Laparoscopy for ectopic pregnancy  2008    mtx also  . Lithotripsy    . Cystoscopy/retrograde/ureteroscopy    . Laparoscopy  11/01/2011    Procedure: LAPAROSCOPY OPERATIVE;  Surgeon: Zenaida Niece, MD;  Location: [facility];  Service: Gynecology;  Laterality: N/A;  . Dilation and curettage of uterus  11/01/2011    Procedure: DILATATION AND CURETTAGE;  Surgeon: Zenaida Niece, MD;  Location: [facility];  Service: Gynecology;  Laterality: N/A;    Family History: Family History  Problem Relation Age of Onset  . Cancer Maternal Grandmother     Social History: History   Social History  . Marital Status: Married    Spouse Name: N/A  . Number of Children: N/A  . Years of Education: N/A   Occupational History  . Not on file.   Social History Main Topics  . Smoking status: Never Smoker   . Smokeless tobacco: Not on file  . Alcohol Use: No  . Drug Use: No  . Sexual Activity: Yes   Other Topics Concern  . Not on file   Social History Narrative    Medications: Prior to  Admission medications   Medication Sig Start Date End Date Taking? Authorizing Provider  acetaminophen (TYLENOL) 500 MG tablet Take 500 mg by mouth every 6 (six) hours as needed.    Historical Provider, MD  cyclobenzaprine (FLEXERIL) 5 MG tablet Take 1 tablet (5 mg total) by mouth every 8 (eight) hours as needed for muscle spasms. 05/15/15 05/14/16  Lori A Clemmons, CNM  cyclobenzaprine (FLEXERIL) 5 MG tablet Take 1 tablet (5 mg total) by mouth every 8 (eight) hours as needed for muscle spasms. 05/15/15 05/14/16  Lori A Clemmons, CNM  FLUoxetine (PROZAC) 20 MG capsule Take 20 mg by mouth daily.    Historical Provider, MD  NIFEdipine (PROCARDIA) 10 MG capsule Take 1 capsule (10 mg total) by mouth every 8 (eight) hours as needed (for preterm contractions). 05/15/15   Elmore Guise Clemmons, CNM  Prenatal Vit-Fe Fumarate-FA (PRENATAL MULTIVITAMIN) TABS tablet Take 1 tablet by mouth daily at 12 noon.    Historical Provider, MD    Allergies: Allergies  Allergen Reactions  . Sulfa Antibiotics Hives and Itching  . Tramadol Other (See Comments)    seizures  . Prochlorperazine Edisylate Anxiety    tachycardia    Physical Exam: Vitals: Blood pressure 110/64, pulse 95, temperature 98.2 F (36.8 C), temperature source Oral, resp. rate 20, height  (1.626 m), weight 56.7 kg (125 lb), last menstrual period  11/10/2014.  Urine Dip Protein: see UA   FHT: 140, moderate, positive accels, no decels Toco: absent  General:  NAD HEENT: normocepahlic, anicteric Pulmonary: no increased work of breathing Abdomen: Gravid, non-tender on distracted exam Leopolds: vtx Genitourinary: closed Extremities: no edema  Labs: Results for orders placed or performed during the hospital encounter of 05/20/15 (from the past 24 hour(s))  Fetal fibronectin     Status: Abnormal   Collection Time: 05/20/15  2:44 PM  Result Value Ref Range   Fetal Fibronectin POSITIVE (A) NEGATIVE   Appearance, FETFIB CLEAR CLEAR  Urinalysis  complete, with microscopic ([facility] only)     Status: Abnormal   Collection Time: 05/20/15  5:19 PM  Result Value Ref Range   Color, Urine YELLOW (A) YELLOW   APPearance CLEAR (A) CLEAR   Glucose, UA NEGATIVE NEGATIVE mg/dL   Bilirubin Urine NEGATIVE NEGATIVE   Ketones, ur 1+ (A) NEGATIVE mg/dL   Specific Gravity, Urine 1.020 1.005 - 1.030   Hgb urine dipstick NEGATIVE NEGATIVE   pH 6.0 5.0 - 8.0   Protein, ur NEGATIVE NEGATIVE mg/dL   Nitrite NEGATIVE NEGATIVE   Leukocytes, UA 1+ (A) NEGATIVE   RBC / HPF 0-5 0 - 5 RBC/hpf   WBC, UA 6-30 0 - 5 WBC/hpf   Bacteria, UA RARE (A) NONE SEEN   Squamous Epithelial / LPF 6-30 (A) NONE SEEN   Mucous PRESENT     Assessment: 39 y.o. [id number] 5152w3d by 06/28/2015 with discomforts of preganancy Plan: 1) Discomforts of pregnacy- reassured, supportive measures -UA pending  2) Fetus - cat I tracing  3) Disposition - home follow up on 05/27/15

## 2015-05-27 LAB — OB RESULTS CONSOLE GBS: GBS: NEGATIVE

## 2015-06-06 ENCOUNTER — Encounter: Payer: Self-pay | Admitting: Advanced Practice Midwife

## 2015-06-06 ENCOUNTER — Inpatient Hospital Stay: Payer: 59 | Admitting: Anesthesiology

## 2015-06-06 ENCOUNTER — Inpatient Hospital Stay
Admission: EM | Admit: 2015-06-06 | Discharge: 2015-06-09 | DRG: 775 | Disposition: A | Discharge status: Home / Self Care | Payer: 59 | Attending: Advanced Practice Midwife | Admitting: Advanced Practice Midwife

## 2015-06-06 DIAGNOSIS — O09529 Supervision of elderly multigravida, unspecified trimester: Secondary | ICD-10-CM | POA: Diagnosis not present

## 2015-06-06 DIAGNOSIS — O4100X0 Oligohydramnios, unspecified trimester, not applicable or unspecified: Secondary | ICD-10-CM | POA: Diagnosis present

## 2015-06-06 DIAGNOSIS — Z87442 Personal history of urinary calculi: Secondary | ICD-10-CM | POA: Diagnosis not present

## 2015-06-06 DIAGNOSIS — Z302 Encounter for sterilization: Secondary | ICD-10-CM | POA: Diagnosis present

## 2015-06-06 DIAGNOSIS — O99344 Other mental disorders complicating childbirth: Secondary | ICD-10-CM | POA: Diagnosis present

## 2015-06-06 DIAGNOSIS — Z87891 Personal history of nicotine dependence: Secondary | ICD-10-CM | POA: Diagnosis not present

## 2015-06-06 DIAGNOSIS — O4103X0 Oligohydramnios, third trimester, not applicable or unspecified: Principal | ICD-10-CM | POA: Diagnosis present

## 2015-06-06 DIAGNOSIS — F419 Anxiety disorder, unspecified: Secondary | ICD-10-CM | POA: Diagnosis present

## 2015-06-06 DIAGNOSIS — Z3A37 37 weeks gestation of pregnancy: Secondary | ICD-10-CM | POA: Diagnosis present

## 2015-06-06 DIAGNOSIS — Z809 Family history of malignant neoplasm, unspecified: Secondary | ICD-10-CM

## 2015-06-06 HISTORY — DX: Mental disorder, not otherwise specified: F99

## 2015-06-06 HISTORY — DX: Anxiety disorder, unspecified: F41.9

## 2015-06-06 HISTORY — DX: Unspecified convulsions: R56.9

## 2015-06-06 LAB — TYPE AND SCREEN
ABO/RH(D): O POS
Antibody Screen: NEGATIVE

## 2015-06-06 LAB — CBC
HEMATOCRIT: 35.2 % (ref 35.0–47.0)
HEMOGLOBIN: 11.6 g/dL — AB (ref 12.0–16.0)
MCH: 30.3 pg (ref 26.0–34.0)
MCHC: 33 g/dL (ref 32.0–36.0)
MCV: 91.8 fL (ref 80.0–100.0)
Platelets: 308 10*3/uL (ref 150–440)
RBC: 3.83 MIL/uL (ref 3.80–5.20)
RDW: 17.9 % — AB (ref 11.5–14.5)
WBC: 8.5 10*3/uL (ref 3.6–11.0)

## 2015-06-06 LAB — ABO/RH: ABO/RH(D): O POS

## 2015-06-06 MED ORDER — FENTANYL 2.5 MCG/ML W/ROPIVACAINE 0.2% IN NS 100 ML EPIDURAL INFUSION (ARMC-ANES)
EPIDURAL | Status: AC
  Administered 2015-06-06: 9 mL/h via EPIDURAL
  Filled 2015-06-06: qty 100

## 2015-06-06 MED ORDER — EPHEDRINE 5 MG/ML INJ
10.0000 mg | INTRAVENOUS | Status: DC | PRN
  Filled 2015-06-06: qty 2

## 2015-06-06 MED ORDER — FLUOXETINE HCL 20 MG PO CAPS
20.0000 mg | ORAL_CAPSULE | Freq: Every day | ORAL | Status: DC
  Administered 2015-06-06 – 2015-06-09 (×3): 20 mg via ORAL
  Filled 2015-06-06 (×5): qty 1

## 2015-06-06 MED ORDER — TERBUTALINE SULFATE 1 MG/ML IJ SOLN: 0.2500 mg | Freq: Once | INTRAMUSCULAR | Status: AC | PRN

## 2015-06-06 MED ORDER — OXYTOCIN BOLUS FROM INFUSION: 500.0000 mL | INTRAVENOUS | Status: DC

## 2015-06-06 MED ORDER — OXYTOCIN 40 UNITS IN LACTATED RINGERS INFUSION - SIMPLE MED
62.5000 mL/h | INTRAVENOUS | Status: DC
  Filled 2015-06-06: qty 1000

## 2015-06-06 MED ORDER — LIDOCAINE-EPINEPHRINE (PF) 1.5 %-1:200000 IJ SOLN
INTRAMUSCULAR | Status: DC | PRN
  Administered 2015-06-06: 4 mL via PERINEURAL

## 2015-06-06 MED ORDER — LACTATED RINGERS IV SOLN: 500.0000 mL | INTRAVENOUS | Status: DC | PRN

## 2015-06-06 MED ORDER — DIPHENHYDRAMINE HCL 50 MG/ML IJ SOLN: 12.5000 mg | INTRAMUSCULAR | Status: DC | PRN

## 2015-06-06 MED ORDER — DINOPROSTONE 10 MG VA INST
10.0000 mg | VAGINAL_INSERT | Freq: Once | VAGINAL | Status: AC
  Administered 2015-06-06: 10 mg via VAGINAL
  Filled 2015-06-06: qty 1

## 2015-06-06 MED ORDER — WITCH HAZEL-GLYCERIN EX PADS
MEDICATED_PAD | CUTANEOUS | Status: DC | PRN
  Filled 2015-06-06: qty 100

## 2015-06-06 MED ORDER — ACETAMINOPHEN 325 MG PO TABS
650.0000 mg | ORAL_TABLET | ORAL | Status: DC | PRN
  Administered 2015-06-06: 650 mg via ORAL
  Filled 2015-06-06: qty 2

## 2015-06-06 MED ORDER — BUPIVACAINE HCL (PF) 0.25 % IJ SOLN
INTRAMUSCULAR | Status: DC | PRN
  Administered 2015-06-06: 5 mL via PERINEURAL

## 2015-06-06 MED ORDER — BUTORPHANOL TARTRATE 1 MG/ML IJ SOLN
2.0000 mg | INTRAMUSCULAR | Status: DC | PRN
  Administered 2015-06-06: 2 mg via INTRAVENOUS

## 2015-06-06 MED ORDER — BUTORPHANOL TARTRATE 1 MG/ML IJ SOLN
INTRAMUSCULAR | Status: AC
  Administered 2015-06-06: 2 mg via INTRAVENOUS
  Filled 2015-06-06: qty 2

## 2015-06-06 MED ORDER — LACTATED RINGERS IV SOLN
INTRAVENOUS | Status: DC
  Administered 2015-06-07: 18:00:00 via INTRAVENOUS

## 2015-06-06 MED ORDER — ZOLPIDEM TARTRATE 5 MG PO TABS
5.0000 mg | ORAL_TABLET | Freq: Every evening | ORAL | Status: DC | PRN
  Administered 2015-06-06: 5 mg via ORAL
  Filled 2015-06-06: qty 1

## 2015-06-06 MED ORDER — PHENYLEPHRINE 40 MCG/ML (10ML) SYRINGE FOR IV PUSH (FOR BLOOD PRESSURE SUPPORT)
80.0000 ug | PREFILLED_SYRINGE | INTRAVENOUS | Status: DC | PRN
  Filled 2015-06-06: qty 2

## 2015-06-06 MED ORDER — FENTANYL 2.5 MCG/ML W/ROPIVACAINE 0.2% IN NS 100 ML EPIDURAL INFUSION (ARMC-ANES)
9.0000 mL/h | EPIDURAL | Status: DC
  Administered 2015-06-06: 9 mL/h via EPIDURAL

## 2015-06-06 MED ORDER — LIDOCAINE HCL (PF) 1 % IJ SOLN
INTRAMUSCULAR | Status: DC | PRN
  Administered 2015-06-06: 2 mL via SUBCUTANEOUS

## 2015-06-06 NOTE — Progress Notes (Signed)
Pt restless in bed and stadol provided no relief. Pt restless and thrashing in bed. No cervical change observed however pt reports this was how her last labor progressed: no cervical change to delivered. Talked with Tammy Brothers about plan of care. Cervidil removed and plan for epidural placement. Pt agrees with POC

## 2015-06-06 NOTE — Anesthesia Procedure Notes (Signed)
Epidural Patient location during procedure: OB  Staffing Performed by: anesthesiologist   Preanesthetic Checklist Completed: patient identified, site marked, surgical consent, pre-op evaluation, timeout performed, IV checked, risks and benefits discussed and monitors and equipment checked  Epidural Patient position: sitting Prep: Betadine Patient monitoring: heart rate, continuous pulse ox and blood pressure Approach: midline Location: L4-L5 Injection technique: LOR saline  Needle:  Needle type: Tuohy  Needle gauge: 18 G Needle length: 9 cm and 9 Needle insertion depth: 5 cm Catheter type: closed end flexible Catheter size: 20 Guage Test dose: negative and 1.5% lidocaine with Epi 1:200 K  Assessment Sensory level: T10 Events: blood not aspirated, injection not painful, no injection resistance, negative IV test and no paresthesia  Additional Notes   Patient tolerated the insertion well without complications.-SATD -IVTD. No paresthesia. Refer to [facility] nursing for VS and dosingReason for block:procedure for pain   

## 2015-06-06 NOTE — Anesthesia Preprocedure Evaluation (Signed)
Anesthesia Evaluation  Patient identified by MRN, date of birth, ID band Patient awake    Reviewed: Allergy & Precautions, H&P , NPO status , Patient's Chart, lab work & pertinent test results, reviewed documented beta blocker date and time   Airway Mallampati: III  TM Distance: >3 FB Neck ROM: full    Dental no notable dental hx. (+) Teeth Intact   Pulmonary neg pulmonary ROS, former smoker,  breath sounds clear to auscultation  Pulmonary exam normal       Cardiovascular Exercise Tolerance: Good negative cardio ROS Normal cardiovascular examRhythm:regular Rate:Normal     Neuro/Psych  Headaches, Seizures -,  Hx of L1 and L3 vertebral compression fxs with hx of chronic lbp.  R/B of epidural reviewed and accepted by pt.   Neuromuscular disease negative neurological ROS  negative psych ROS   GI/Hepatic negative GI ROS, Neg liver ROS,   Endo/Other  negative endocrine ROS  Renal/GU Renal diseasenegative Renal ROSstones  negative genitourinary   Musculoskeletal   Abdominal   Peds  Hematology negative hematology ROS (+)   Anesthesia Other Findings   Reproductive/Obstetrics (+) Pregnancy                             Anesthesia Physical Anesthesia Plan  ASA: II and emergent  Anesthesia Plan: Regional and Epidural   Post-op Pain Management:    Induction:   Airway Management Planned:   Additional Equipment:   Intra-op Plan:   Post-operative Plan:   Informed Consent: I have reviewed the patients History and Physical, chart, labs and discussed the procedure including the risks, benefits and alternatives for the proposed anesthesia with the patient or authorized representative who has indicated his/her understanding and acceptance.     Plan Discussed with: CRNA  Anesthesia Plan Comments:         Anesthesia Quick Evaluation

## 2015-06-06 NOTE — Progress Notes (Signed)
Called into room by RN as pt is very anxious and c/o vaginal burning pain and contraction pain.  Cervix re-examined, no change. Toco adjusted, ctx q 2 min.  Offered stadol IV which pt agrees to.  Will continue to monitor given h/o precipitous delivery with G1.

## 2015-06-06 NOTE — Progress Notes (Signed)
Cervidil placed intravaginally. Pt tolerated fairly well.  Cervix FT/80/0 Speculum exam: no HSV lesions noted

## 2015-06-06 NOTE — H&P (Signed)
Obstetric History and Physical  [name] is a 39 y.o. [id number] with Estimated Date of Delivery: 06/27/15 per 10 wk [place] who presents at [redacted]w[redacted]d  presenting for IOL after AFI <5 cm at office today and non-reactive NST. Patient states she has been having rare contractions, no vaginal bleeding, intact membranes, with active fetal movement.    Prenatal Course Source of Care: [facility]  with onset of care at 15 weeks Pregnancy complications or risks: H/o LLP (resolved) H/o Cryo on cervix Patient Active Problem List   Diagnosis Date Noted  . Anemia in pregnancy - requiring Fe transfusion during pregnancy 05/04/2015  . H/o Cervical laceration following SVD  11/01/2011  . SVD (spontaneous vaginal delivery) 10/31/2011  . HSV (herpes simplex virus) infection 09/02/2011  . AMA (advanced maternal age) multigravida 35+ 09/02/2011  . Normal pregnancy 09/02/2011   She plans to breastfeed She desires bilateral tubal ligation for postpartum contraception.   Prenatal labs and studies: ABO, Rh: O+  Antibody: negative Rubella: Immune Varicella: Immune RPR:  Non-reactive HBsAg:  Negative HIV: Negative GC/CT: neg/neg GBS: negative 1 hr Glucola: 99   Genetic screening: Informaseq and MSAFP negative    Prenatal Transfer Tool   Past Medical History  Diagnosis Date  . UTI (urinary tract infection)   . Chronic back pain   . Lumbar radiculopathy   . Ectopic pregnancy   . Anemia   . Seizures     one occurence, no triggering event  . Anxiety   . Mental disorder - ADHD   . Kidney stone     stent and lithotrisy 2009    Past Surgical History  Procedure Laterality Date  . Laparoscopy for ectopic pregnancy  2008    mtx also  . Lithotripsy    . Cystoscopy/retrograde/ureteroscopy    . Laparoscopy  11/01/2011    Procedure: LAPAROSCOPY OPERATIVE;  Surgeon: Zenaida Niece, MD;  Location: [facility];  Service: Gynecology;  Laterality: N/A;  . Dilation and curettage of uterus  11/01/2011    Procedure:  DILATATION AND CURETTAGE;  Surgeon: Zenaida Niece, MD;  Location: [facility];  Service: Gynecology;  Laterality: N/A;    OB History  Gravida Para Term Preterm AB SAB TAB Ectopic Multiple Living  3 1 1  0 1 0 0 1 0 1    # Outcome Date GA Lbr Len/2nd Weight Sex Delivery Anes PTL Lv  3 Current           2 Term 10/31/11 [redacted]w[redacted]d 08:20 / 00:59 7 lb 2.6 oz (3.249 kg) F Vag-Spont EPI  Y  1 Ectopic               History   Social History  . Marital Status: Married    Spouse Name: N/A  . Number of Children: N/A  . Years of Education: N/A   Social History Main Topics  . Smoking status: Former Games developer  . Smokeless tobacco: Not on file  . Alcohol Use: No  . Drug Use: No  . Sexual Activity: Yes   Other Topics Concern  . None   Social History Narrative    Family History  Problem Relation Age of Onset  . Cancer Maternal Grandmother     Prescriptions prior to admission  Medication Sig Dispense Refill Last Dose  . acetaminophen (TYLENOL) 500 MG tablet Take 500 mg by mouth every 6 (six) hours as needed.   06/05/2015 at Unknown time  . ferrous sulfate 325 (65 FE) MG tablet Take 325 mg  by mouth daily with breakfast.     . FLUoxetine (PROZAC) 20 MG capsule Take 20 mg by mouth daily.   Past Week at Unknown time  . Prenatal Vit-Fe Fumarate-FA (PRENATAL MULTIVITAMIN) TABS tablet Take 1 tablet by mouth daily at 12 noon.   05/15/2015 at Unknown time   Valtrex - daily  Allergies  Allergen Reactions  . Sulfa Antibiotics Hives and Itching  . Tramadol Other (See Comments)    seizures  . Prochlorperazine Edisylate Anxiety    tachycardia    Review of Systems: Negative except for what is mentioned in HPI.  Physical Exam: Temp(Src) 97.7 F (36.5 C) (Oral)  Ht  (1.6 m)  Wt 120 lb (54.432 kg)  BMI 21.26 kg/m2  LMP 11/10/2014 (LMP Unknown) GENERAL: Well-developed, well-nourished female, appears anxious LUNGS: Clear to auscultation bilaterally.  HEART: Regular rate and rhythm. ABDOMEN:  Soft, nontender, nondistended, gravid. EXTREMITIES: Nontender, no edema Cervical Exam: FT/80-90/0 Presentation: cephalic FHT: Category: 1 Baseline rate 120 bpm   Variability moderate  Accelerations present   Decelerations none Contractions: Every rare mins   Pertinent Labs/Studies:   No results found for this or any previous visit (from the past 24 hour(s)).  Assessment : IUP at 37 wks, Oligohydramnios  Plan: IOL for Oligohydramnios. Pt initially inquiring about primary LTCS d/t h/o cervical laceration with PPH requiring blood transfusion with prior vaginal delivery. Discussed research stating low risk of recurrence of cervical laceration and the fact that bleeding and injury risk are increased with C-section. Also discussed with Dr Bonney Aid who agrees with recommendation against C-section for h/o cervical laceration. After discussion, pt in agreement with POM for IOL. Will begin cervidil shortly.   GBS negative  H/o HSV - on Valtrex, no lesions on exam at office, will do speculum exam with Cervidil placement.

## 2015-06-07 ENCOUNTER — Encounter: Payer: Self-pay | Admitting: *Deleted

## 2015-06-07 LAB — RPR: RPR: NONREACTIVE

## 2015-06-07 MED ORDER — IBUPROFEN 600 MG PO TABS
ORAL_TABLET | ORAL | Status: AC
  Administered 2015-06-08: 600 mg via ORAL
  Filled 2015-06-07: qty 1

## 2015-06-07 MED ORDER — OXYTOCIN 40 UNITS IN LACTATED RINGERS INFUSION - SIMPLE MED
62.5000 mL/h | INTRAVENOUS | Status: DC | PRN
  Administered 2015-06-08: 62.5 mL/h via INTRAVENOUS
  Filled 2015-06-07: qty 1000

## 2015-06-07 MED ORDER — SIMETHICONE 80 MG PO CHEW: 80.0000 mg | CHEWABLE_TABLET | ORAL | Status: DC | PRN

## 2015-06-07 MED ORDER — OXYCODONE-ACETAMINOPHEN 5-325 MG PO TABS
ORAL_TABLET | ORAL | Status: AC
  Filled 2015-06-07: qty 1

## 2015-06-07 MED ORDER — ONDANSETRON HCL 4 MG PO TABS: 4.0000 mg | ORAL_TABLET | ORAL | Status: DC | PRN

## 2015-06-07 MED ORDER — ZOLPIDEM TARTRATE 5 MG PO TABS
5.0000 mg | ORAL_TABLET | Freq: Every evening | ORAL | Status: DC | PRN
  Administered 2015-06-07: 5 mg via ORAL
  Filled 2015-06-07: qty 1

## 2015-06-07 MED ORDER — ONDANSETRON HCL 4 MG/2ML IJ SOLN: 4.0000 mg | INTRAMUSCULAR | Status: DC | PRN

## 2015-06-07 MED ORDER — IBUPROFEN 600 MG PO TABS
600.0000 mg | ORAL_TABLET | Freq: Four times a day (QID) | ORAL | Status: DC
  Administered 2015-06-08 – 2015-06-09 (×6): 600 mg via ORAL
  Filled 2015-06-07 (×6): qty 1

## 2015-06-07 MED ORDER — BENZOCAINE-MENTHOL 20-0.5 % EX AERO: 1.0000 "application " | INHALATION_SPRAY | CUTANEOUS | Status: DC | PRN

## 2015-06-07 MED ORDER — WITCH HAZEL-GLYCERIN EX PADS
1.0000 "application " | MEDICATED_PAD | CUTANEOUS | Status: DC | PRN
  Administered 2015-06-08: 1 via TOPICAL
  Filled 2015-06-07: qty 100

## 2015-06-07 MED ORDER — OXYTOCIN 40 UNITS IN LACTATED RINGERS INFUSION - SIMPLE MED
1.0000 m[IU]/min | INTRAVENOUS | Status: DC
Start: 2015-06-07 — End: 2015-06-07
  Administered 2015-06-07: 1 m[IU]/min via INTRAVENOUS

## 2015-06-07 MED ORDER — OXYCODONE-ACETAMINOPHEN 5-325 MG PO TABS
2.0000 | ORAL_TABLET | ORAL | Status: DC | PRN
  Administered 2015-06-07 – 2015-06-08 (×2): 2 via ORAL
  Filled 2015-06-07 (×2): qty 2

## 2015-06-07 MED ORDER — PRENATAL MULTIVITAMIN CH
1.0000 | ORAL_TABLET | Freq: Every day | ORAL | Status: DC
  Administered 2015-06-08 – 2015-06-09 (×2): 1 via ORAL
  Filled 2015-06-07 (×2): qty 1

## 2015-06-07 MED ORDER — IBUPROFEN 600 MG PO TABS
600.0000 mg | ORAL_TABLET | Freq: Four times a day (QID) | ORAL | Status: DC
  Administered 2015-06-07 (×2): 600 mg via ORAL
  Filled 2015-06-07: qty 1

## 2015-06-07 MED ORDER — DOCUSATE SODIUM 100 MG PO CAPS
100.0000 mg | ORAL_CAPSULE | Freq: Two times a day (BID) | ORAL | Status: DC
  Administered 2015-06-07 – 2015-06-09 (×4): 100 mg via ORAL
  Filled 2015-06-07 (×4): qty 1

## 2015-06-07 MED ORDER — FERROUS SULFATE 325 (65 FE) MG PO TABS: 325.0000 mg | ORAL_TABLET | Freq: Every day | ORAL | Status: DC

## 2015-06-07 MED ORDER — TERBUTALINE SULFATE 1 MG/ML IJ SOLN
0.2500 mg | Freq: Once | INTRAMUSCULAR | Status: DC | PRN
  Filled 2015-06-07: qty 1

## 2015-06-07 MED ORDER — DIBUCAINE 1 % RE OINT
1.0000 "application " | TOPICAL_OINTMENT | RECTAL | Status: DC | PRN
  Administered 2015-06-08 – 2015-06-09 (×2): 1 via RECTAL
  Filled 2015-06-07: qty 28

## 2015-06-07 MED ORDER — BENZOCAINE-MENTHOL 20-0.5 % EX AERO
INHALATION_SPRAY | CUTANEOUS | Status: AC
  Filled 2015-06-07: qty 56

## 2015-06-07 MED ORDER — OXYCODONE-ACETAMINOPHEN 5-325 MG PO TABS
1.0000 | ORAL_TABLET | ORAL | Status: DC | PRN
  Administered 2015-06-07 – 2015-06-09 (×8): 1 via ORAL
  Filled 2015-06-07 (×7): qty 1

## 2015-06-07 MED ORDER — LANOLIN HYDROUS EX OINT
TOPICAL_OINTMENT | CUTANEOUS | Status: DC | PRN
Start: 2015-06-07 — End: 2015-06-09

## 2015-06-07 NOTE — Progress Notes (Signed)
Admit Date: 06/06/2015 Today's Date: 06/07/2015  Post Partum Day 0  Subjective:  no complaints and planning PP BTL for contraception.  Objective: Temp:  [97.6 F (36.4 C)-98 F (36.7 C)] 98 F (36.7 C) (08/02 1038) Pulse Rate:  [59-100] 70 (08/02 1038) Resp:  [18-189] 189 (08/02 1038) BP: (84-130)/(29-82) 130/75 mmHg (08/02 1038) Weight:  [54.432 kg (120 lb)] 54.432 kg (120 lb) (08/01 1434)   Recent Labs  06/06/15 1703  HGB 11.6*  HCT 35.2    Assessment/Plan: Contraception is to be BTL (Right salpingectomy likely all she needs as has h/o prior left salpingectoimy for ectopic)  Pros and cons and permanancy and failure rates of PP Tubal Procedure discussed.   LOS: 1 day   Shoua Ressler [facility] 06/07/2015, 1:41 PM

## 2015-06-07 NOTE — Progress Notes (Signed)
L&D Note  06/07/2015 - 8:34 AM  38 y.o. G3P1011 [redacted]w[redacted]d   Ms. Torah L Kotas is admitted for IOL for Oligo   Subjective:  Comfortable with epidural  Objective:   Filed Vitals:   06/07/15 0414 06/07/15 0514 06/07/15 0714 06/07/15 0813  BP: 105/63 112/72 102/69 122/76  Pulse: 61 65 67 63  Temp:      TempSrc:      Resp:      Height:      Weight:        Current Vital Signs 24h Vital Sign Ranges  T 97.6 F (36.4 C) Temp  Avg: 97.7 F (36.5 C)  Min: 97.6 F (36.4 C)  Max: 97.7 F (36.5 C)  BP 122/76 mmHg BP  Min: 84/29  Max: 125/69  HR 63 Pulse  Avg: 76.6  Min: 59  Max: 100  RR 18 Resp  Avg: 18  Min: 18  Max: 18  SaO2     No Data Recorded       24 Hour I/O Current Shift I/O  Time Ins Outs        FHR: category 1 tracing Toco: q 2.5-3.5 SVE: 3/90/0, bloody show   Assessment :  IUP at 37 wks, IOL for Oligo    Plan:  Continue active management Consider AROM when unit census allows  [place], [place], [place]

## 2015-06-07 NOTE — Progress Notes (Signed)
Labor and delivery note: IOL in progress for oligohydraminos. 37.1 weeks today   S: Comfortable with epidural  O: 122/76 Contractions q2-3 minutes apart on 57miu/min FHR 135 with accelerations to 150s, moderate variability Cervix: 3.5/70%/-1 to 0  A: IOL for oligohydraminos Reactive fetal heart tracing  P: AROM: small amt moderate meconium stained AF.  Discussed care of baby's airway after delivery with mother Will have neonatal/respiratory present for delivery Monitor FWB closely IUPC inserted- titrate Pitocin to 200 mvus.  Farrel Conners, CNM

## 2015-06-08 ENCOUNTER — Encounter
Admission: EM | Disposition: A | Discharge status: Home / Self Care | Payer: Self-pay | Source: Home / Self Care | Attending: Advanced Practice Midwife

## 2015-06-08 ENCOUNTER — Encounter: Payer: Self-pay | Admitting: Lactation Services

## 2015-06-08 LAB — CBC
HEMATOCRIT: 31.9 % — AB (ref 35.0–47.0)
HEMOGLOBIN: 10.6 g/dL — AB (ref 12.0–16.0)
MCH: 30.9 pg (ref 26.0–34.0)
MCHC: 33.2 g/dL (ref 32.0–36.0)
MCV: 93.1 fL (ref 80.0–100.0)
PLATELETS: 203 10*3/uL (ref 150–440)
RBC: 3.42 MIL/uL — ABNORMAL LOW (ref 3.80–5.20)
RDW: 17.3 % — AB (ref 11.5–14.5)
WBC: 10.5 10*3/uL (ref 3.6–11.0)

## 2015-06-08 LAB — SURGICAL PATHOLOGY

## 2015-06-08 SURGERY — LIGATION, FALLOPIAN TUBE, POSTPARTUM
Anesthesia: Choice

## 2015-06-08 MED ORDER — HYDROCORTISONE 1 % EX CREA
TOPICAL_CREAM | Freq: Two times a day (BID) | CUTANEOUS | Status: DC
  Administered 2015-06-08 – 2015-06-09 (×3): via TOPICAL
  Filled 2015-06-08: qty 28

## 2015-06-08 MED ORDER — POLYETHYLENE GLYCOL 3350 17 G PO PACK
17.0000 g | PACK | Freq: Every day | ORAL | Status: DC
  Administered 2015-06-09: 17 g via ORAL
  Filled 2015-06-08: qty 1

## 2015-06-08 NOTE — Anesthesia Postprocedure Evaluation (Addendum)
  Anesthesia Post-op Note  Patient: [name]  Procedure(s) Performed: * No procedures listed *  Anesthesia type:Regional, Epidural  Patient location: Mother baby  Post pain: Pain level controlled  Post assessment: Post-op Vital signs reviewed, Patient's Cardiovascular Status Stable, Respiratory Function Stable, Patent Airway and No signs of Nausea or vomiting  Post vital signs: Reviewed and stable  Last Vitals:  Filed Vitals:   06/08/15 0335  BP: 135/72  Pulse: 53  Temp: 36.6 C  Resp: 18    Level of consciousness: awake, alert  and patient cooperative  Complications: No apparent anesthesia complications

## 2015-06-08 NOTE — Addendum Note (Signed)
Addendum  created 06/08/15 0731 by Malva Cogan, CRNA   Modules edited: Notes Section   Notes Section:  File: [id number]

## 2015-06-08 NOTE — Progress Notes (Signed)
Daily Post Partum Note  [name] is a 39 y.o. [id number] PPD#1 s/p  SVD/IP  @ [redacted]w[redacted]d  Pregnancy c/b AMA, IOL for oligo, anxiety/depression, remote h/o seizure  24hr/overnight events:  none  Subjective:  Patient states she was bleeding "heavy" but not doing pad counts, no s/s of anemia, ambulating fine, no s/s of pre-eclampsia and having some hemorrhoid pain  Objective:    Current Vital Signs 24h Vital Sign Ranges  T 97.8 F (36.6 C) Temp  Avg: 97.9 F (36.6 C)  Min: 97.8 F (36.6 C)  Max: 98.3 F (36.8 C)  BP 138/84 mmHg BP  Min: 122/76  Max: 147/85  HR (!) 58 Pulse  Avg: 59.3  Min: 53  Max: 70  RR 18 Resp  Avg: 42.4  Min: 18  Max: 189  SaO2 100 % RA SpO2  Avg: 100 %  Min: 100 %  Max: 100 %       24 Hour I/O Current Shift I/O  Time Ins Outs 08/02 0701 - 08/03 0700 In: 210.5 [I.V.:210.5] Out: 725 [Urine:350]      General: NAD Abdomen: nttp, nd, FF below the umbilicus approx 3cm Perineum: deferred Skin:  Warm and dry.  Cardiovascular:Regular rate and rhythm. Respiratory:  Clear to auscultation bilateral. Normal respiratory effort Extremities: no c/c/e  Medications Current Facility-Administered Medications  Medication Dose Route Frequency Provider Last Rate Last Dose  . benzocaine-Menthol (DERMOPLAST) 20-0.5 % topical spray 1 application  1 application Topical PRN Farrel Conners, CNM      . witch hazel-glycerin (TUCKS) pad 1 application  1 application Topical PRN Farrel Conners, CNM       And  . dibucaine (NUPERCAINAL) 1 % rectal ointment 1 application  1 application Rectal PRN Farrel Conners, CNM      . docusate sodium (COLACE) capsule 100 mg  100 mg Oral BID Farrel Conners, CNM   100 mg at 06/07/15 1950  . ferrous sulfate tablet 325 mg  325 mg Oral Q breakfast Farrel Conners, CNM      . FLUoxetine (PROZAC) capsule 20 mg  20 mg Oral Daily Marta Antu, CNM   20 mg at 06/06/15 1715  . ibuprofen (ADVIL,MOTRIN) tablet 600 mg  600 mg Oral 4 times per day  Marta Antu, CNM   600 mg at 06/08/15 0135  . lanolin ointment   Topical PRN Farrel Conners, CNM      . ondansetron (ZOFRAN) tablet 4 mg  4 mg Oral Q4H PRN Farrel Conners, CNM       Or  . ondansetron (ZOFRAN) injection 4 mg  4 mg Intravenous Q4H PRN Farrel Conners, CNM      . oxyCODONE-acetaminophen (PERCOCET/ROXICET) 5-325 MG per tablet 1 tablet  1 tablet Oral Q4H PRN Farrel Conners, CNM   1 tablet at 06/07/15 1950  . oxyCODONE-acetaminophen (PERCOCET/ROXICET) 5-325 MG per tablet 2 tablet  2 tablet Oral Q4H PRN Farrel Conners, CNM   2 tablet at 06/08/15 0335  . oxytocin (PITOCIN) IV infusion 40 units in LR 1000 mL  62.5 mL/hr Intravenous Continuous PRN Farrel Conners, CNM 62.5 mL/hr at 06/08/15 0435 62.5 mL/hr at 06/08/15 0435  . prenatal multivitamin tablet 1 tablet  1 tablet Oral Q1200 Farrel Conners, CNM      . simethicone (MYLICON) chewable tablet 80 mg  80 mg Oral PRN Farrel Conners, CNM      . zolpidem (AMBIEN) tablet 5 mg  5 mg Oral QHS PRN Nadara Mustard, MD   5 mg at  06/07/15 2320     Recent Labs Lab 06/06/15 1703 06/08/15 0449  WBC 8.5 10.5  HGB 11.6* 10.6*  HCT 35.2 31.9*  PLT 308 203   Radiology: none  Assessment & Plan:  Pt doing well *Postpartum/postop: routine care. Pt would like BTL but wouldn't like to do it now and would like to do it as an interval procedure. Will see her back in 4weeks. D/w her regarding depo vs abstinence and pt to consider options -topical meds and BM regimen for hemorrhoids. Pt told to let us know if it gets worse -appropriate drop in CBC after SVD. Pt told to do pad counts. RN aware *psych: continue home meds *BPs: no e/o pre-eclampsia. If BPs become severe, will order HELLP labs. Will continue to monitor  O POS / Rubella Immune / RPR negative / HIV negative / HepBsAg negative / Tdap UTD: Yes/pap and HPV neg / Bottle  / see above / Follow up: Johnsie Kindred. MD [facility] Pager  [phone]

## 2015-06-09 MED ORDER — OXYCODONE-ACETAMINOPHEN 5-325 MG PO TABS: 1.0000 | ORAL_TABLET | ORAL | Status: DC | PRN

## 2015-06-09 MED ORDER — IBUPROFEN 600 MG PO TABS: 600.0000 mg | ORAL_TABLET | Freq: Four times a day (QID) | ORAL | Status: DC

## 2015-06-09 MED ORDER — ZOLPIDEM TARTRATE 5 MG PO TABS
5.0000 mg | ORAL_TABLET | Freq: Every evening | ORAL | Status: DC | PRN
  Administered 2015-06-09: 5 mg via ORAL
  Filled 2015-06-09: qty 1

## 2015-06-09 NOTE — Discharge Summary (Signed)
Physician Obstetric Discharge Summary  Patient ID: [name] MRN: [id number] DOB/AGE: 1976-10-29 39 y.o.   Date of Admission: 06/06/2015  Date of Delivery: 06/07/2015  Date of Discharge: 06/09/15  Admitting Diagnosis: Induction of labor at [redacted]w[redacted]d  Secondary Diagnosis: Oligohydraminos, Advanced maternal age, Anxiety disorder  Mode of Delivery: normal spontaneous vaginal delivery     Discharge Diagnosis: Oligohydramnios - delivered   Intrapartum Procedures: Cervidil ripening, Pitocin augmentation, epidural anesthesia, amniotomy, intrauterine pressure catheter   Post partum procedures: none  Complications: none   Brief Hospital Course  [name] is a [id number] who had a SVD on 06/07/2015 after IOL for oligohydraminos;  for further details of this delivery, please refer to the delivery note.  Patient had an uncomplicated postpartum course.  By time of discharge on PPD#2, her pain was controlled on oral pain medications; she had appropriate lochia and was ambulating, voiding without difficulty and tolerating regular diet. She did c/o small hemorrhoid which was being treated with Tucks pads.  She was deemed stable for discharge to home.       Labs: CBC Latest Ref Rng 06/08/2015 06/06/2015 05/15/2015  WBC 3.6 - 11.0 K/uL 10.5 8.5 15.7(H)  Hemoglobin 12.0 - 16.0 g/dL 10.6(L) 11.6(L) 10.6(L)  Hematocrit 35.0 - 47.0 % 31.9(L) 35.2 32.3(L)  Platelets 150 - 440 K/uL 203 308 368   O POS/RI/VI/TDAP UTD  Physical exam:  Blood pressure 120/67, pulse 74, temperature 98.3 F (36.8 C), temperature source Oral, resp. rate 18, height  (1.6 m), weight 120 lb (54.432 kg), last menstrual period 11/10/2014, SpO2 100 %, unknown if currently breastfeeding. General: alert and no distress Lochia: appropriate Abdomen: soft, NT Uterine Fundus: firm Extremities: No evidence of DVT seen on physical exam. No lower extremity edema.  Discharge Instructions: Per After Visit Summary. Activity: Advance as  tolerated. Pelvic rest for 6 weeks.  Also refer to Discharge Instructions Diet: Regular Medications:   Medication List    STOP taking these medications        acetaminophen 500 MG tablet  Commonly known as:  TYLENOL     ferrous sulfate 325 (65 FE) MG tablet      TAKE these medications        FLUoxetine 20 MG capsule  Commonly known as:  PROZAC  Take 20 mg by mouth daily.     ibuprofen 600 MG tablet  Commonly known as:  ADVIL,MOTRIN  Take 1 tablet (600 mg total) by mouth every 6 (six) hours.     oxyCODONE-acetaminophen 5-325 MG per tablet  Commonly known as:  PERCOCET/ROXICET  Take 1 tablet by mouth every 4 (four) hours as needed (for pain scale 4-7).     prenatal multivitamin Tabs tablet  Take 1 tablet by mouth daily at 12 noon.       Outpatient follow up:  Postpartum contraception: initially desiresd postpartum tubal, but decided to have done as an interval tubal  Discharged Condition: good  Discharged to: home   Newborn Data: Disposition:In Special Care Nursery  Apgars: APGAR (1 MIN): 8   APGAR (5 MINS): 8   APGAR (10 MINS):    Baby Feeding: Breast/pumping Avery / 5#8.Frann Rider, [place] 06/09/2015 11:28 AM

## 2015-06-09 NOTE — Progress Notes (Signed)
Patient discharged via wheelchair, going home. Discharge instructions given. Infant in SCN.

## 2015-06-09 NOTE — Discharge Instructions (Signed)
Feeding: Bottle   Support head with chin tucked. Keep arms forward. Pillow may be used behind child to improve support. Position bottle at midline. Bend hips by resting child's bottom between your thighs. May use other arm to separate legs. Child should not arch head, neck or back.  Copyright  VHI. All rights reserved.   Discharge instructions:   Call office if you have any of the following: headache, visual changes, fever >100 F, chills, breast concerns, excessive vaginal bleeding, incision drainage or problems, leg pain or redness, depression or any other concerns.   Activity: Do not lift > 10 lbs for 6 weeks.  No intercourse or tampons for 6 weeks.  No driving for 1-2 weeks.  Postpartum Care After Vaginal Delivery After you deliver your newborn (postpartum period), the usual stay in the hospital is 24-72 hours. If there were problems with your labor or delivery, or if you have other medical problems, you might be in the hospital longer.  While you are in the hospital, you will receive help and instructions on how to care for yourself and your newborn during the postpartum period.  While you are in the hospital:  Be sure to tell your nurses if you have pain or discomfort, as well as where you feel the pain and what makes the pain worse.  If you had an incision made near your vagina (episiotomy) or if you had some tearing during delivery, the nurses may put ice packs on your episiotomy or tear. The ice packs may help to reduce the pain and swelling.  If you are breastfeeding, you may feel uncomfortable contractions of your uterus for a couple of weeks. This is normal. The contractions help your uterus get back to normal size.  It is normal to have some bleeding after delivery.  For the first 1-3 days after delivery, the flow is red and the amount may be similar to a period.  It is common for the flow to start and stop.  In the first few days, you may pass some small clots. Let  your nurses know if you begin to pass large clots or your flow increases.  Do not  flush blood clots down the toilet before having the nurse look at them.  During the next 3-10 days after delivery, your flow should become more watery and pink or brown-tinged in color.  Ten to fourteen days after delivery, your flow should be a small amount of yellowish-white discharge.  The amount of your flow will decrease over the first few weeks after delivery. Your flow may stop in 6-8 weeks. Most women have had their flow stop by 12 weeks after delivery.  You should change your sanitary pads frequently.  Wash your hands thoroughly with soap and water for at least 20 seconds after changing pads, using the toilet, or before holding or feeding your newborn.  You should feel like you need to empty your bladder within the first 6-8 hours after delivery.  In case you become weak, lightheaded, or faint, call your nurse before you get out of bed for the first time and before you take a shower for the first time.  Within the first few days after delivery, your breasts may begin to feel tender and full. This is called engorgement. Breast tenderness usually goes away within 48-72 hours after engorgement occurs. You may also notice milk leaking from your breasts. If you are not breastfeeding, do not stimulate your breasts. Breast stimulation can make your breasts produce  more milk.  Spending as much time as possible with your newborn is very important. During this time, you and your newborn can feel close and get to know each other. Having your newborn stay in your room (rooming in) will help to strengthen the bond with your newborn. It will give you time to get to know your newborn and become comfortable caring for your newborn.  Your hormones change after delivery. Sometimes the hormone changes can temporarily cause you to feel sad or tearful. These feelings should not last more than a few days. If these feelings  last longer than that, you should talk to your caregiver.  If desired, talk to your caregiver about methods of family planning or contraception.  Talk to your caregiver about immunizations. Your caregiver may want you to have the following immunizations before leaving the hospital:  Tetanus, diphtheria, and pertussis (Tdap) or tetanus and diphtheria (Td) immunization. It is very important that you and your family (including grandparents) or others caring for your newborn are up-to-date with the Tdap or Td immunizations. The Tdap or Td immunization can help protect your newborn from getting ill.  Rubella immunization.  Varicella (chickenpox) immunization.  Influenza immunization. You should receive this annual immunization if you did not receive the immunization during your pregnancy. Document Released: 08/19/2007 Document Revised: 07/16/2012 Document Reviewed: 06/18/2012 [facility] Patient Information 2015 [place], [place]. This information is not intended to replace advice given to you by your health care provider. Make sure you discuss any questions you have with your health care provider.

## 2015-06-14 ENCOUNTER — Other Ambulatory Visit: Payer: Self-pay | Admitting: *Deleted

## 2015-06-14 DIAGNOSIS — O99013 Anemia complicating pregnancy, third trimester: Secondary | ICD-10-CM

## 2015-06-15 ENCOUNTER — Inpatient Hospital Stay: Payer: 59

## 2015-06-15 ENCOUNTER — Inpatient Hospital Stay: Payer: 59 | Attending: Oncology

## 2015-06-15 ENCOUNTER — Inpatient Hospital Stay: Payer: 59 | Admitting: Oncology

## 2015-07-05 ENCOUNTER — Inpatient Hospital Stay: Payer: 59 | Admitting: Oncology

## 2015-07-05 ENCOUNTER — Inpatient Hospital Stay: Payer: 59

## 2015-07-06 ENCOUNTER — Encounter: Payer: Self-pay | Admitting: *Deleted

## 2015-07-06 ENCOUNTER — Other Ambulatory Visit: Payer: 59

## 2015-07-08 NOTE — Patient Instructions (Signed)
  Your procedure is scheduled on: 07-15-15 Report to [facility] 2ND FLOOR To find out your arrival time please call [phone] between 1PM - 3PM on 07-14-15 (THURSDAY)  Remember: Instructions that are not followed completely may result in serious medical risk, up to and including death, or upon the discretion of your surgeon and anesthesiologist your surgery may need to be rescheduled.    _X___ 1. Do not eat food or drink liquids after midnight. No gum chewing or hard candies.     _X___ 2. No Alcohol for 24 hours before or after surgery.   ____ 3. Bring all medications with you on the day of surgery if instructed.    _X___ 4. Notify your doctor if there is any change in your medical condition     (cold, fever, infections).     Do not wear jewelry, make-up, hairpins, clips or nail polish.  Do not wear lotions, powders, or perfumes. You may wear deodorant.  Do not shave 48 hours prior to surgery. Men may shave face and neck.  Do not bring valuables to the hospital.    [facility] is not responsible for any belongings or valuables.               Contacts, dentures or bridgework may not be worn into surgery.  Leave your suitcase in the car. After surgery it may be brought to your room.  For patients admitted to the hospital, discharge time is determined by your  treatment team.   Patients discharged the day of surgery will not be allowed to drive home.   Please read over the following fact sheets that you were given:     _X___ Take these medicines the morning of surgery with A SIP OF WATER:    1. CYMBALTA  2.   3.   4.  5.  6.  ____ Fleet Enema (as directed)   _X___ Use CHG Soap as directed  ____ Use inhalers on the day of surgery  ____ Stop metformin 2 days prior to surgery    ____ Take 1/2 of usual insulin dose the night before surgery and none on the morning of surgery.   ____ Stop Coumadin/Plavix/aspirin-N/A  ____ Stop Anti-inflammatories-NO  NSAIDS OR ASPIRIN PRODUCTS-TYLENOL OK   ____ Stop supplements until after surgery.    ____ Bring C-Pap to the hospital.

## 2015-07-12 ENCOUNTER — Other Ambulatory Visit: Payer: 59

## 2015-07-13 ENCOUNTER — Other Ambulatory Visit: Payer: 59

## 2015-07-14 ENCOUNTER — Other Ambulatory Visit: Payer: 59

## 2015-07-15 ENCOUNTER — Ambulatory Visit: Payer: 59 | Admitting: Anesthesiology

## 2015-07-15 ENCOUNTER — Encounter
Admission: RE | Disposition: A | Discharge status: Home / Self Care | Payer: Self-pay | Source: Ambulatory Visit | Attending: Obstetrics & Gynecology

## 2015-07-15 ENCOUNTER — Ambulatory Visit
Admission: RE | Admit: 2015-07-15 | Discharge: 2015-07-15 | Disposition: A | Discharge status: Home / Self Care | Payer: 59 | Source: Ambulatory Visit | Attending: Obstetrics & Gynecology | Admitting: Obstetrics & Gynecology

## 2015-07-15 ENCOUNTER — Encounter: Payer: Self-pay | Admitting: *Deleted

## 2015-07-15 DIAGNOSIS — Z302 Encounter for sterilization: Secondary | ICD-10-CM | POA: Insufficient documentation

## 2015-07-15 DIAGNOSIS — D649 Anemia, unspecified: Secondary | ICD-10-CM | POA: Insufficient documentation

## 2015-07-15 DIAGNOSIS — G709 Myoneural disorder, unspecified: Secondary | ICD-10-CM | POA: Diagnosis not present

## 2015-07-15 DIAGNOSIS — Z9079 Acquired absence of other genital organ(s): Secondary | ICD-10-CM | POA: Diagnosis not present

## 2015-07-15 DIAGNOSIS — Z87891 Personal history of nicotine dependence: Secondary | ICD-10-CM | POA: Diagnosis not present

## 2015-07-15 DIAGNOSIS — R569 Unspecified convulsions: Secondary | ICD-10-CM | POA: Diagnosis not present

## 2015-07-15 DIAGNOSIS — F419 Anxiety disorder, unspecified: Secondary | ICD-10-CM | POA: Insufficient documentation

## 2015-07-15 DIAGNOSIS — R51 Headache: Secondary | ICD-10-CM | POA: Insufficient documentation

## 2015-07-15 HISTORY — DX: Headache: R51

## 2015-07-15 HISTORY — DX: Other specified postprocedural states: R11.2

## 2015-07-15 HISTORY — DX: Other specified postprocedural states: Z98.890

## 2015-07-15 HISTORY — PX: LAPAROSCOPIC TUBAL LIGATION: SHX1937

## 2015-07-15 HISTORY — DX: Headache, unspecified: R51.9

## 2015-07-15 HISTORY — DX: Nausea with vomiting, unspecified: R11.2

## 2015-07-15 LAB — CBC
HCT: 36.3 % (ref 35.0–47.0)
HEMOGLOBIN: 11.9 g/dL — AB (ref 12.0–16.0)
MCH: 29.9 pg (ref 26.0–34.0)
MCHC: 32.9 g/dL (ref 32.0–36.0)
MCV: 91 fL (ref 80.0–100.0)
PLATELETS: 279 10*3/uL (ref 150–440)
RBC: 3.99 MIL/uL (ref 3.80–5.20)
RDW: 14.7 % — AB (ref 11.5–14.5)
WBC: 7.8 10*3/uL (ref 3.6–11.0)

## 2015-07-15 LAB — POCT PREGNANCY, URINE: Preg Test, Ur: NEGATIVE

## 2015-07-15 LAB — TYPE AND SCREEN
ABO/RH(D): O POS
ANTIBODY SCREEN: NEGATIVE

## 2015-07-15 SURGERY — LIGATION, FALLOPIAN TUBE, LAPAROSCOPIC
Anesthesia: General | Site: Abdomen | Laterality: Bilateral | Wound class: Clean Contaminated

## 2015-07-15 MED ORDER — MIDAZOLAM HCL 2 MG/2ML IJ SOLN
1.0000 mg | Freq: Once | INTRAMUSCULAR | Status: AC
  Administered 2015-07-15: 1 mg via INTRAVENOUS

## 2015-07-15 MED ORDER — LIDOCAINE HCL (CARDIAC) 20 MG/ML IV SOLN
INTRAVENOUS | Status: DC | PRN
  Administered 2015-07-15: 30 mg via INTRAVENOUS

## 2015-07-15 MED ORDER — FAMOTIDINE 20 MG PO TABS
20.0000 mg | ORAL_TABLET | Freq: Once | ORAL | Status: AC
  Administered 2015-07-15: 20 mg via ORAL

## 2015-07-15 MED ORDER — MIDAZOLAM HCL 2 MG/2ML IJ SOLN
INTRAMUSCULAR | Status: DC | PRN
  Administered 2015-07-15: 1 mg via INTRAVENOUS

## 2015-07-15 MED ORDER — FENTANYL CITRATE (PF) 100 MCG/2ML IJ SOLN
INTRAMUSCULAR | Status: DC | PRN
  Administered 2015-07-15 (×2): 50 ug via INTRAVENOUS

## 2015-07-15 MED ORDER — NEOSTIGMINE METHYLSULFATE 10 MG/10ML IV SOLN
INTRAVENOUS | Status: DC | PRN
  Administered 2015-07-15: 3 mg via INTRAVENOUS

## 2015-07-15 MED ORDER — GLYCOPYRROLATE 0.2 MG/ML IJ SOLN
INTRAMUSCULAR | Status: DC | PRN
  Administered 2015-07-15: 0.6 mg via INTRAVENOUS

## 2015-07-15 MED ORDER — ONDANSETRON HCL 4 MG/2ML IJ SOLN
INTRAMUSCULAR | Status: DC | PRN
  Administered 2015-07-15: 4 mg via INTRAVENOUS

## 2015-07-15 MED ORDER — HYDROMORPHONE HCL 1 MG/ML IJ SOLN
0.5000 mg | INTRAMUSCULAR | Status: AC | PRN
  Administered 2015-07-15 (×4): 0.5 mg via INTRAVENOUS

## 2015-07-15 MED ORDER — ONDANSETRON HCL 4 MG/2ML IJ SOLN: 4.0000 mg | Freq: Once | INTRAMUSCULAR | Status: DC | PRN

## 2015-07-15 MED ORDER — OXYCODONE-ACETAMINOPHEN 5-325 MG PO TABS
ORAL_TABLET | ORAL | Status: AC
  Filled 2015-07-15: qty 1

## 2015-07-15 MED ORDER — PROPOFOL 10 MG/ML IV BOLUS
INTRAVENOUS | Status: DC | PRN
  Administered 2015-07-15: 150 mg via INTRAVENOUS

## 2015-07-15 MED ORDER — ROCURONIUM BROMIDE 100 MG/10ML IV SOLN
INTRAVENOUS | Status: DC | PRN
  Administered 2015-07-15: 40 mg via INTRAVENOUS

## 2015-07-15 MED ORDER — FENTANYL CITRATE (PF) 100 MCG/2ML IJ SOLN
25.0000 ug | INTRAMUSCULAR | Status: DC | PRN
  Administered 2015-07-15 (×4): 25 ug via INTRAVENOUS

## 2015-07-15 MED ORDER — HYDROMORPHONE HCL 1 MG/ML IJ SOLN
INTRAMUSCULAR | Status: AC
  Filled 2015-07-15: qty 1

## 2015-07-15 MED ORDER — LACTATED RINGERS IV SOLN
INTRAVENOUS | Status: DC
  Administered 2015-07-15 (×2): via INTRAVENOUS

## 2015-07-15 MED ORDER — OXYCODONE-ACETAMINOPHEN 5-325 MG PO TABS
1.0000 | ORAL_TABLET | ORAL | Status: DC | PRN
  Administered 2015-07-15: 1 via ORAL

## 2015-07-15 MED ORDER — OXYCODONE-ACETAMINOPHEN 5-325 MG PO TABS: 1.0000 | ORAL_TABLET | ORAL | Status: DC | PRN

## 2015-07-15 SURGICAL SUPPLY — 29 items
BAG COUNTER SPONGE EZ (MISCELLANEOUS) ×2 IMPLANT
BLADE SURG SZ11 CARB STEEL (BLADE) ×3 IMPLANT
CANISTER SUCT 1200ML W/VALVE (MISCELLANEOUS) ×3 IMPLANT
CATH ROBINSON RED A/P 16FR (CATHETERS) ×3 IMPLANT
CHLORAPREP W/TINT 26ML (MISCELLANEOUS) ×3 IMPLANT
CLIP HULKA (CLIP) ×6 IMPLANT
COUNTER SPONGE BAG EZ (MISCELLANEOUS) ×1
DRSG TEGADERM 2-3/8X2-3/4 SM (GAUZE/BANDAGES/DRESSINGS) ×6 IMPLANT
GAUZE SPONGE NON-WVN 2X2 STRL (MISCELLANEOUS) ×2 IMPLANT
GLOVE BIO SURGEON STRL SZ8 (GLOVE) ×3 IMPLANT
GLOVE INDICATOR 8.0 STRL GRN (GLOVE) ×3 IMPLANT
GOWN STRL REUS W/ TWL LRG LVL3 (GOWN DISPOSABLE) ×1 IMPLANT
GOWN STRL REUS W/ TWL XL LVL3 (GOWN DISPOSABLE) ×1 IMPLANT
GOWN STRL REUS W/TWL LRG LVL3 (GOWN DISPOSABLE) ×2
GOWN STRL REUS W/TWL XL LVL3 (GOWN DISPOSABLE) ×2
LABEL OR SOLS (LABEL) ×3 IMPLANT
LIQUID BAND (GAUZE/BANDAGES/DRESSINGS) ×3 IMPLANT
NEEDLE VERESS 14GA 120MM (NEEDLE) ×3 IMPLANT
NS IRRIG 500ML POUR BTL (IV SOLUTION) ×3 IMPLANT
PACK GYN LAPAROSCOPIC (MISCELLANEOUS) ×3 IMPLANT
PAD OB MATERNITY 4.3X12.25 (PERSONAL CARE ITEMS) ×3 IMPLANT
PAD PREP 24X41 OB/GYN DISP (PERSONAL CARE ITEMS) ×3 IMPLANT
SPONGE VERSALON 2X2 STRL (MISCELLANEOUS) ×4
SUT VIC AB 2-0 UR6 27 (SUTURE) ×3 IMPLANT
SUT VIC AB 4-0 PS2 18 (SUTURE) ×6 IMPLANT
SYRINGE 10CC LL (SYRINGE) ×6 IMPLANT
TROCAR ENDO BLADELESS 11MM (ENDOMECHANICALS) ×3 IMPLANT
TROCAR XCEL NON-BLD 5MMX100MML (ENDOMECHANICALS) ×3 IMPLANT
TUBING INSUFFLATOR HI FLOW (MISCELLANEOUS) ×3 IMPLANT

## 2015-07-15 NOTE — H&P (Signed)
History and Physical Interval Note:  07/15/2015 11:19 AM  [name]  has presented today for surgery, with the diagnosis of DESIRE FOR PERMANENT STERILITY  The various methods of treatment have been discussed with the patient and family. After consideration of risks, benefits and other options for treatment, the patient has consented to  Procedure(s): LAPAROSCOPIC TUBAL LIGATION (Bilateral) as a surgical intervention .  The patient's history has been reviewed, patient examined, no change in status, stable for surgery.  Pt has the following beta blocker history-  Not taking Beta Blocker.  I have reviewed the patient's chart and labs.  Questions were answered to the patient's satisfaction.       [name] Renae Fickle

## 2015-07-15 NOTE — Transfer of Care (Signed)
Immediate Anesthesia Transfer of Care Note  Patient: [name]  Procedure(s) Performed: Procedure(s): LAPAROSCOPIC TUBAL LIGATION (Bilateral)  Patient Location: PACU  Anesthesia Type:General  Level of Consciousness: awake, sedated and patient cooperative  Airway & Oxygen Therapy: Patient Spontanous Breathing and Patient connected to face mask oxygen  Post-op Assessment: Report given to RN and Post -op Vital signs reviewed and stable  Post vital signs: Reviewed and stable  Last Vitals:  Filed Vitals:   07/15/15 1400  BP: 144/92  Pulse: 90  Temp: 36.2 C  Resp: 14    Complications: No apparent anesthesia complications

## 2015-07-15 NOTE — Anesthesia Preprocedure Evaluation (Addendum)
Anesthesia Evaluation  Patient identified by MRN, date of birth, ID band Patient awake    Reviewed: Allergy & Precautions, NPO status , Patient's Chart, lab work & pertinent test results, reviewed documented beta blocker date and time   History of Anesthesia Complications (+) PONV and history of anesthetic complications  Airway Mallampati: II  TM Distance: >3 FB     Dental  (+) Chipped   Pulmonary former smoker,           Cardiovascular      Neuro/Psych  Headaches, Seizures -,  PSYCHIATRIC DISORDERS Anxiety  Neuromuscular disease    GI/Hepatic   Endo/Other    Renal/GU Renal InsufficiencyRenal disease     Musculoskeletal   Abdominal   Peds  Hematology  (+) anemia ,   Anesthesia Other Findings   Reproductive/Obstetrics                            Anesthesia Physical Anesthesia Plan  ASA: II  Anesthesia Plan: General   Post-op Pain Management:    Induction: Intravenous  Airway Management Planned: Oral ETT  Additional Equipment:   Intra-op Plan:   Post-operative Plan:   Informed Consent: I have reviewed the patients History and Physical, chart, labs and discussed the procedure including the risks, benefits and alternatives for the proposed anesthesia with the patient or authorized representative who has indicated his/her understanding and acceptance.     Plan Discussed with: CRNA  Anesthesia Plan Comments:         Anesthesia Quick Evaluation

## 2015-07-15 NOTE — Op Note (Signed)
  Operative Note   07/15/2015  PRE-OP DIAGNOSIS: Desire for permanent sterilization  POST-OP DIAGNOSIS: same   PROCEDURE: Procedure(s): LAPAROSCOPIC TUBAL LIGATION - RIGHT SIDE  SURGEON: Annamarie Major, MD, FACOG  ANESTHESIA: Choice   ESTIMATED BLOOD LOSS: Min  COMPLICATIONS: None  DISPOSITION: PACU - hemodynamically stable.  CONDITION: stable  FINDINGS: Laparoscopic survey of the abdomen revealed a grossly normal uterus, right tube, ovaries, liver edge, gallbladder edge and appendix, No intra-abdominal adhesions were noted.  Left tube was noticeably absent from a prior procedure.  PROCEDURE IN DETAIL: The patient was taken to the OR where anesthesia was administed. The patient was positioned in dorsal lithotomy in the [place] stirrups. The patient was then examined under anesthesia with the above noted findings. The patient was prepped and draped in the normal sterile fashion and foley catheter was placed. A Graves speculum was placed in the vagina and the anterior lip of the cervix was grasped with a single toothed tenaculum. A Hulka uterine manipulator was then inserted in the uterus. Uterine mobility was found to be satisfactory. The speculum was then removed.  Attention was turned to the patient's abdomen where a 5 mm skin incision was made in the umbilical fold, after injection of local anesthesia. The Veress step needle was carefully introduced into the peritoneal cavity with placement confirmed using the hanging drop technique.  Pneumoperitoneum was obtained. The 5 mm port was then placed under direct visualization with the operative laparoscope  The above noted findings.  Trendelenburg.  A 11 mm trocar was then placed in the suprapubic region under direct visualization with the laparoscope.  Right  fallopian tube was identified and followed out to their fimbria.  A hulka clip is placed in the midportion of  tube, with a second one then placed lateral to the first, so that two clips are  visually seen completely occluding the tube.  No injuries or bleeding was noted.  All instruments and ports were then removed from the abdomen after gas was ecpelled and patient was leveled.   The skin was closed with skin adhesive. The Hulka tenaculum was removed from the cervix with no bleeding noted from the cervix and all other instrumentation was removed from the vagina. The foley catheter was removed. The patient tolerated the procedure well. All counts were correct x 2. The patient was transferred to the recovery room awake, alert and breathing independently.

## 2015-07-15 NOTE — Discharge Instructions (Signed)
General Gynecological Post-Operative Instructions °You may expect to feel dizzy, weak, and drowsy for as long as 24 hours after receiving the medicine that made you sleep (anesthetic).  °Do not drive a car, ride a bicycle, participate in physical activities, or take public transportation until you are done taking narcotic pain medicines or as directed by your doctor.  °Do not drink alcohol or take tranquilizers.  °Do not take medicine that has not been prescribed by your doctor.  °Do not sign important papers or make important decisions while on narcotic pain medicines.  °Have a responsible person with you.  °CARE OF INCISION  °Keep incision clean and dry. °Take showers instead of baths until your doctor gives you permission to take baths.  °Avoid heavy lifting (more than 10 pounds/4.5 kilograms), pushing, or pulling.  °Avoid activities that may risk injury to your surgical site.  °No sexual intercourse or placement of anything in the vagina for 2 weeks or as instructed by your doctor. °If you have tubes coming from the wound site, check with your doctor regarding appropriate care of the tubes. °Only take prescription or over-the-counter medicines  for pain, discomfort, or fever as directed by your doctor. Do not take aspirin. It can make you bleed. Take medicines (antibiotics) that kill germs if they are prescribed for you.  °Call the office or go to the ER if:  °You feel sick to your stomach (nauseous) and you start to throw up (vomit).  °You have trouble eating or drinking.  °You have an oral temperature above 101.  °You have constipation that is not helped by adjusting diet or increasing fluid intake. Pain medicines are a common cause of constipation.  °You have any other concerns. °SEEK IMMEDIATE MEDICAL CARE IF:  °You have persistent dizziness.  °You have difficulty breathing or a congested sounding (croupy) cough.  °You have an oral temperature above 102.5, not controlled by medicine.  °There is increasing  pain or tenderness near or in the surgical site.  ° ° ° °

## 2015-07-15 NOTE — Anesthesia Procedure Notes (Signed)
Procedure Name: Intubation Date/Time: 07/15/2015 1:03 PM Performed by: Charna Busman Pre-anesthesia Checklist: Patient identified, Emergency Drugs available, Suction available and Patient being monitored Patient Re-evaluated:Patient Re-evaluated prior to inductionPreoxygenation: Pre-oxygenation with 100% oxygen Intubation Type: IV induction and Combination inhalational/ intravenous induction Ventilation: Mask ventilation without difficulty Laryngoscope Size: Miller and 3 Grade View: Grade II Tube type: Oral Tube size: 6.0 mm Number of attempts: 1 Airway Equipment and Method: Stylet Placement Confirmation: ETT inserted through vocal cords under direct vision,  positive ETCO2,  CO2 detector and breath sounds checked- equal and bilateral Secured at: 21 cm Tube secured with: Tape Dental Injury: Teeth and Oropharynx as per pre-operative assessment

## 2015-07-18 ENCOUNTER — Encounter: Payer: Self-pay | Admitting: Obstetrics & Gynecology

## 2015-07-18 NOTE — Anesthesia Postprocedure Evaluation (Signed)
  Anesthesia Post-op Note  Patient: [name]  Procedure(s) Performed: Procedure(s): LAPAROSCOPIC TUBAL LIGATION (Bilateral)  Anesthesia type:General  Patient location: PACU  Post pain: Pain level controlled  Post assessment: Post-op Vital signs reviewed, Patient's Cardiovascular Status Stable, Respiratory Function Stable, Patent Airway and No signs of Nausea or vomiting  Post vital signs: Reviewed and stable  Last Vitals:  Filed Vitals:   07/15/15 1543  BP: 127/72  Pulse: 73  Temp:   Resp: 16    Level of consciousness: awake, alert  and patient cooperative  Complications: No apparent anesthesia complications

## 2015-11-17 DIAGNOSIS — Z79891 Long term (current) use of opiate analgesic: Secondary | ICD-10-CM | POA: Diagnosis not present

## 2015-11-17 DIAGNOSIS — M47816 Spondylosis without myelopathy or radiculopathy, lumbar region: Secondary | ICD-10-CM | POA: Diagnosis not present

## 2015-11-17 DIAGNOSIS — M5136 Other intervertebral disc degeneration, lumbar region: Secondary | ICD-10-CM | POA: Diagnosis not present

## 2015-11-17 DIAGNOSIS — G894 Chronic pain syndrome: Secondary | ICD-10-CM | POA: Diagnosis not present

## 2016-02-22 DIAGNOSIS — Z79899 Other long term (current) drug therapy: Secondary | ICD-10-CM | POA: Diagnosis not present

## 2016-02-22 DIAGNOSIS — Z79891 Long term (current) use of opiate analgesic: Secondary | ICD-10-CM | POA: Diagnosis not present

## 2016-02-22 DIAGNOSIS — G894 Chronic pain syndrome: Secondary | ICD-10-CM | POA: Diagnosis not present

## 2016-02-22 DIAGNOSIS — M47816 Spondylosis without myelopathy or radiculopathy, lumbar region: Secondary | ICD-10-CM | POA: Diagnosis not present

## 2016-02-22 DIAGNOSIS — M5136 Other intervertebral disc degeneration, lumbar region: Secondary | ICD-10-CM | POA: Diagnosis not present

## 2016-05-09 DIAGNOSIS — N898 Other specified noninflammatory disorders of vagina: Secondary | ICD-10-CM | POA: Diagnosis not present

## 2016-05-09 DIAGNOSIS — N760 Acute vaginitis: Secondary | ICD-10-CM | POA: Diagnosis not present

## 2016-05-09 DIAGNOSIS — T192XXA Foreign body in vulva and vagina, initial encounter: Secondary | ICD-10-CM | POA: Diagnosis not present

## 2016-05-17 DIAGNOSIS — G894 Chronic pain syndrome: Secondary | ICD-10-CM | POA: Diagnosis not present

## 2016-05-17 DIAGNOSIS — Z79891 Long term (current) use of opiate analgesic: Secondary | ICD-10-CM | POA: Diagnosis not present

## 2016-05-17 DIAGNOSIS — M5136 Other intervertebral disc degeneration, lumbar region: Secondary | ICD-10-CM | POA: Diagnosis not present

## 2016-05-17 DIAGNOSIS — M47816 Spondylosis without myelopathy or radiculopathy, lumbar region: Secondary | ICD-10-CM | POA: Diagnosis not present

## 2016-09-14 DIAGNOSIS — Z79891 Long term (current) use of opiate analgesic: Secondary | ICD-10-CM | POA: Diagnosis not present

## 2016-09-14 DIAGNOSIS — M47816 Spondylosis without myelopathy or radiculopathy, lumbar region: Secondary | ICD-10-CM | POA: Diagnosis not present

## 2016-09-14 DIAGNOSIS — M5136 Other intervertebral disc degeneration, lumbar region: Secondary | ICD-10-CM | POA: Diagnosis not present

## 2016-09-14 DIAGNOSIS — G894 Chronic pain syndrome: Secondary | ICD-10-CM | POA: Diagnosis not present

## 2017-09-14 ENCOUNTER — Other Ambulatory Visit: Payer: Self-pay | Admitting: Nurse Practitioner

## 2019-10-18 ENCOUNTER — Emergency Department: Payer: Medicaid Other

## 2019-10-18 ENCOUNTER — Other Ambulatory Visit: Payer: Self-pay

## 2019-10-18 ENCOUNTER — Emergency Department
Admission: EM | Admit: 2019-10-18 | Discharge: 2019-10-18 | Disposition: A | Discharge status: Home / Self Care | Payer: Medicaid Other | Attending: Emergency Medicine | Admitting: Emergency Medicine

## 2019-10-18 ENCOUNTER — Encounter: Payer: Self-pay | Admitting: Emergency Medicine

## 2019-10-18 DIAGNOSIS — S63502A Unspecified sprain of left wrist, initial encounter: Secondary | ICD-10-CM | POA: Insufficient documentation

## 2019-10-18 DIAGNOSIS — Y999 Unspecified external cause status: Secondary | ICD-10-CM | POA: Diagnosis not present

## 2019-10-18 DIAGNOSIS — W19XXXA Unspecified fall, initial encounter: Secondary | ICD-10-CM | POA: Diagnosis not present

## 2019-10-18 DIAGNOSIS — Y929 Unspecified place or not applicable: Secondary | ICD-10-CM | POA: Insufficient documentation

## 2019-10-18 DIAGNOSIS — F17200 Nicotine dependence, unspecified, uncomplicated: Secondary | ICD-10-CM | POA: Diagnosis not present

## 2019-10-18 DIAGNOSIS — Y939 Activity, unspecified: Secondary | ICD-10-CM | POA: Diagnosis not present

## 2019-10-18 DIAGNOSIS — S52502A Unspecified fracture of the lower end of left radius, initial encounter for closed fracture: Secondary | ICD-10-CM | POA: Diagnosis not present

## 2019-10-18 DIAGNOSIS — S6992XA Unspecified injury of left wrist, hand and finger(s), initial encounter: Secondary | ICD-10-CM | POA: Diagnosis present

## 2019-10-18 DIAGNOSIS — S62102A Fracture of unspecified carpal bone, left wrist, initial encounter for closed fracture: Secondary | ICD-10-CM

## 2019-10-18 MED ORDER — CYCLOBENZAPRINE HCL 5 MG PO TABS: 5.0000 mg | ORAL_TABLET | Freq: Three times a day (TID) | ORAL | 0 refills | Status: DC | PRN

## 2019-10-18 MED ORDER — KETOROLAC TROMETHAMINE 30 MG/ML IJ SOLN
30.0000 mg | Freq: Once | INTRAMUSCULAR | Status: AC
  Administered 2019-10-18: 30 mg via INTRAMUSCULAR
  Filled 2019-10-18: qty 1

## 2019-10-18 NOTE — ED Provider Notes (Signed)
[facility] Emergency Department Provider Note ____________________________________________  Time seen: 1025  I have reviewed the triage vital signs and the nursing notes.  HISTORY  Chief Complaint  Wrist Pain  HPI [name] is a 43 y.o. female presents to the ED for evaluation of left wrist pain following a mechanical fall, last night. She describes falling backwards on an outstretched hand. She complains of bruising and swelling. No other injuries are reported.  She presented this morning after she awoke with swelling and bruising to the hand.   Past Medical History:  Diagnosis Date  . Anemia   . Anxiety   . Chronic back pain   . Ectopic pregnancy   . Headache    MIGRAINES  . Kidney stone    stent and lithotrisy 2009  . Lumbar radiculopathy   . Mental disorder   . PONV (postoperative nausea and vomiting)    DURING KIDNEY STONE REMOVAL  . Seizures (HCC) 2014   one occurence, no triggering event  . UTI (urinary tract infection)     Patient Active Problem List   Diagnosis Date Noted  . Oligohydramnios antepartum 06/06/2015  . Threatened preterm labor 05/20/2015  . Decreased fetal movement   . Preterm contractions   . [redacted] weeks gestation of pregnancy   . Anemia in pregnancy 05/04/2015  . Cervical laceration 11/01/2011  . SVD (spontaneous vaginal delivery) 10/31/2011  . HSV (herpes simplex virus) infection 09/02/2011  . AMA (advanced maternal age) multigravida 35+ 09/02/2011  . Normal pregnancy 09/02/2011  . CERVICAL LYMPHADENOPATHY 09/06/2009  . OPIOID WITHDRAWAL 05/11/2009  . LOW BACK PAIN, ACUTE 09/02/2008  . Anxiety state, unspecified 08/03/2008  . FATIGUE 08/03/2008  . PALPITATIONS, RECURRENT 08/03/2008  . ABNORMAL THYROID FUNCTION TESTS 08/03/2008  . NEPHROLITHIASIS, HX OF 08/03/2008  . MIGRAINE WITHOUT AURA 05/22/2007    Past Surgical History:  Procedure Laterality Date  . CYSTOSCOPY/RETROGRADE/URETEROSCOPY    . DILATION AND  CURETTAGE OF UTERUS  11/01/2011   Procedure: DILATATION AND CURETTAGE;  Surgeon: Zenaida Niece, MD;  Location: [facility] ORS;  Service: Gynecology;  Laterality: N/A;  . LAPAROSCOPIC TUBAL LIGATION Bilateral 07/15/2015   Procedure: LAPAROSCOPIC TUBAL LIGATION;  Surgeon: Nadara Mustard, MD;  Location: [facility] ORS;  Service: Gynecology;  Laterality: Bilateral;  . LAPAROSCOPY  11/01/2011   Procedure: LAPAROSCOPY OPERATIVE;  Surgeon: Zenaida Niece, MD;  Location: [facility] ORS;  Service: Gynecology;  Laterality: N/A;  . LAPAROSCOPY FOR ECTOPIC PREGNANCY  2008   mtx also  . LITHOTRIPSY      Prior to Admission medications   Medication Sig Start Date End Date Taking? Authorizing Provider  cyclobenzaprine (FLEXERIL) 5 MG tablet Take 1 tablet (5 mg total) by mouth 3 (three) times daily as needed. 10/18/19   Elzie Knisley, Charlesetta Ivory, PA-C    Allergies Sulfa antibiotics, Tramadol, and Prochlorperazine edisylate  Family History  Problem Relation Age of Onset  . Cancer Maternal Grandmother     Social History Social History   Tobacco Use  . Smoking status: Current Some Day Smoker    Last attempt to quit: 07/05/2010    Years since quitting: 9.2  . Smokeless tobacco: Former Engineer, water Use Topics  . Alcohol use: No  . Drug use: No    Review of Systems  Constitutional: Negative for fever. Cardiovascular: Negative for chest pain. Respiratory: Negative for shortness of breath. Gastrointestinal: Negative for abdominal pain, vomiting and diarrhea. Genitourinary: Negative for dysuria. Musculoskeletal: Negative for back pain. Left wrist pain  as above Skin: Negative for rash. Neurological: Negative for headaches, focal weakness or numbness. ____________________________________________  PHYSICAL EXAM:  VITAL SIGNS: ED Triage Vitals [10/18/19 1008]  Enc Vitals Group     BP (!) 159/103     Pulse Rate (!) 116     Resp 19     Temp 98.3 F (36.8 C)     Temp Source Oral     SpO2 98 %     Weight  145 lb (65.8 kg)     Height 5\' 3"  (1.6 m)     Head Circumference      Peak Flow      Pain Score 6     Pain Loc      Pain Edu?      Excl. in [place]?     Constitutional: Alert and oriented. Well appearing and in no distress. Head: Normocephalic and atraumatic. Eyes: Conjunctivae are normal. Normal extraocular movements Cardiovascular: Normal rate, regular rhythm. Normal distal pulses. Respiratory: Normal respiratory effort. No wheezes/rales/rhonchi. Musculoskeletal: Left hand and wrist with obvious soft tissue swelling and bruising.  Patient with decreased pronation range of motion.  Normal composite fist on exam.  Nontender with normal range of motion in all extremities.  Neurologic:  Normal gross sensation. Normal speech and language. No gross focal neurologic deficits are appreciated. Skin:  Skin is warm, dry and intact. No rash noted. ____________________________________________   RADIOLOGY  DG Left Wrist  IMPRESSION: Mildly displaced distal left radial fracture with intra-articular Extension.  I, Melvenia Needles, personally viewed and evaluated these images (plain radiographs) as part of my medical decision making, as well as reviewing the written report by the radiologist. ____________________________________________  PROCEDURES  Toradol 30 mg IM .Splint Application  Date/Time: 10/18/2019 11:30 AM Performed by: Alger Memos, NT Authorized by: Melvenia Needles, PA-C   Consent:    Consent obtained:  Verbal   Consent given by:  Patient   Alternatives discussed:  No treatment Pre-procedure details:    Sensation:  Normal Procedure details:    Laterality:  Left   Location:  Wrist   Wrist:  L wrist   Splint type:  Sugar tong   Supplies:  Cotton padding, Ortho-Glass, elastic bandage and sling Post-procedure details:    Pain:  Improved   Sensation:  Normal   Patient tolerance of procedure:  Tolerated well, no immediate  complications  ____________________________________________  INITIAL IMPRESSION / ASSESSMENT AND PLAN / ED COURSE  Patient with ED evaluation and initial fracture management of of wrist pain and deformity following a mechanical fall.  She was found to have a comminuted fracture of the distal radius with intra-articular surface involvement.  She is placed in appropriate sugar tong splint and a sling is provided for comfort.  Patient declined any narcotic pain medicine so she is encouraged to take over-the-counter extra strength Tylenol for pain.  She will follow-up with orthopedics tomorrow for further fracture care.  [name] was evaluated in Emergency Department on 10/18/2019 for the symptoms described in the history of present illness. She was evaluated in the context of the global COVID-19 pandemic, which necessitated consideration that the patient might be at risk for infection with the SARS-CoV-2 virus that causes COVID-19. Institutional protocols and algorithms that pertain to the evaluation of patients at risk for COVID-19 are in a state of rapid change based on information released by regulatory bodies including the CDC and federal and state organizations. These policies and algorithms were followed during the patient's  care in the ED. ____________________________________________  FINAL CLINICAL IMPRESSION(S) / ED DIAGNOSES  Final diagnoses:  Closed fracture of left wrist, initial encounter  Sprain of left wrist, initial encounter      Dannell Gortney, JenisLissa Hoarde V Bacon, PA-C 10/18/19 1913    Sharyn CreamerQuale, Mark, MD 10/26/19 1309

## 2019-10-18 NOTE — ED Triage Notes (Signed)
States she fell backwards lat pm  Landed on left wrist  Wrist is bruised and swollen  Good pulses

## 2019-10-18 NOTE — Discharge Instructions (Signed)
You are being treated for a broken wrist. You should wear the splint until you are evaluated by ortho. Apply ice pack over the splint to reduce swelling. Take ES Tylenol (2 pills) every 6-8 hours for pain. Call Dr. Serita Grit office in the morning to schedule and appointment later in the week.

## 2019-10-19 ENCOUNTER — Other Ambulatory Visit
Admission: RE | Admit: 2019-10-19 | Discharge: 2019-10-19 | Disposition: A | Discharge status: Home / Self Care | Payer: Medicaid Other | Source: Ambulatory Visit | Attending: Orthopedic Surgery | Admitting: Orthopedic Surgery

## 2019-10-19 DIAGNOSIS — Z20828 Contact with and (suspected) exposure to other viral communicable diseases: Secondary | ICD-10-CM | POA: Diagnosis not present

## 2019-10-19 DIAGNOSIS — Z01812 Encounter for preprocedural laboratory examination: Secondary | ICD-10-CM | POA: Diagnosis present

## 2019-10-20 ENCOUNTER — Other Ambulatory Visit: Payer: Self-pay | Admitting: Orthopedic Surgery

## 2019-10-20 LAB — SARS CORONAVIRUS 2 (TAT 6-24 HRS): SARS Coronavirus 2: NEGATIVE

## 2019-10-22 ENCOUNTER — Encounter
Admission: RE | Disposition: A | Discharge status: Home / Self Care | Payer: Self-pay | Source: Home / Self Care | Attending: Orthopedic Surgery

## 2019-10-22 ENCOUNTER — Ambulatory Visit: Payer: Medicaid Other | Admitting: Certified Registered"

## 2019-10-22 ENCOUNTER — Other Ambulatory Visit: Payer: Self-pay

## 2019-10-22 ENCOUNTER — Ambulatory Visit: Payer: Medicaid Other

## 2019-10-22 ENCOUNTER — Ambulatory Visit
Admission: RE | Admit: 2019-10-22 | Discharge: 2019-10-22 | Disposition: A | Discharge status: Home / Self Care | Payer: Medicaid Other | Attending: Orthopedic Surgery | Admitting: Orthopedic Surgery

## 2019-10-22 ENCOUNTER — Encounter: Payer: Self-pay | Admitting: Orthopedic Surgery

## 2019-10-22 DIAGNOSIS — Z8781 Personal history of (healed) traumatic fracture: Secondary | ICD-10-CM

## 2019-10-22 DIAGNOSIS — G5602 Carpal tunnel syndrome, left upper limb: Secondary | ICD-10-CM | POA: Diagnosis not present

## 2019-10-22 DIAGNOSIS — F17200 Nicotine dependence, unspecified, uncomplicated: Secondary | ICD-10-CM | POA: Diagnosis not present

## 2019-10-22 DIAGNOSIS — W1830XA Fall on same level, unspecified, initial encounter: Secondary | ICD-10-CM | POA: Diagnosis not present

## 2019-10-22 DIAGNOSIS — S52572A Other intraarticular fracture of lower end of left radius, initial encounter for closed fracture: Secondary | ICD-10-CM | POA: Insufficient documentation

## 2019-10-22 DIAGNOSIS — Z419 Encounter for procedure for purposes other than remedying health state, unspecified: Secondary | ICD-10-CM

## 2019-10-22 HISTORY — PX: CARPAL TUNNEL RELEASE: SHX101

## 2019-10-22 HISTORY — PX: OPEN REDUCTION INTERNAL FIXATION (ORIF) DISTAL RADIAL FRACTURE: SHX5989

## 2019-10-22 LAB — POCT PREGNANCY, URINE: Preg Test, Ur: NEGATIVE

## 2019-10-22 SURGERY — OPEN REDUCTION INTERNAL FIXATION (ORIF) DISTAL RADIUS FRACTURE
Anesthesia: General | Site: Wrist | Laterality: Left

## 2019-10-22 MED ORDER — FAMOTIDINE 20 MG PO TABS: 20.0000 mg | ORAL_TABLET | Freq: Once | ORAL | Status: AC

## 2019-10-22 MED ORDER — ONDANSETRON HCL 4 MG/2ML IJ SOLN
INTRAMUSCULAR | Status: DC | PRN
  Administered 2019-10-22: 4 mg via INTRAVENOUS

## 2019-10-22 MED ORDER — LIDOCAINE HCL (PF) 2 % IJ SOLN
INTRAMUSCULAR | Status: AC
  Filled 2019-10-22: qty 10

## 2019-10-22 MED ORDER — LACTATED RINGERS IV SOLN: INTRAVENOUS | Status: DC | PRN

## 2019-10-22 MED ORDER — FENTANYL CITRATE (PF) 100 MCG/2ML IJ SOLN
25.0000 ug | Freq: Every day | INTRAMUSCULAR | Status: DC | PRN
  Administered 2019-10-22: 50 ug via INTRAVENOUS
  Administered 2019-10-22: 25 ug via INTRAVENOUS

## 2019-10-22 MED ORDER — HYDROCODONE-ACETAMINOPHEN 5-325 MG PO TABS
ORAL_TABLET | ORAL | Status: AC
  Administered 2019-10-22: 1 via ORAL
  Filled 2019-10-22: qty 1

## 2019-10-22 MED ORDER — PHENYLEPHRINE HCL (PRESSORS) 10 MG/ML IV SOLN
INTRAVENOUS | Status: DC | PRN
  Administered 2019-10-22: 100 ug via INTRAVENOUS

## 2019-10-22 MED ORDER — BUPIVACAINE HCL 0.5 % IJ SOLN
INTRAMUSCULAR | Status: DC | PRN
  Administered 2019-10-22: 10 mL

## 2019-10-22 MED ORDER — CHLORHEXIDINE GLUCONATE 4 % EX LIQD
60.0000 mL | Freq: Once | CUTANEOUS | Status: AC
  Administered 2019-10-22: 4 via TOPICAL

## 2019-10-22 MED ORDER — FENTANYL CITRATE (PF) 100 MCG/2ML IJ SOLN
INTRAMUSCULAR | Status: AC
  Filled 2019-10-22: qty 2

## 2019-10-22 MED ORDER — DEXAMETHASONE SODIUM PHOSPHATE 10 MG/ML IJ SOLN
INTRAMUSCULAR | Status: AC
  Filled 2019-10-22: qty 1

## 2019-10-22 MED ORDER — ONDANSETRON HCL 4 MG/2ML IJ SOLN: 4.0000 mg | Freq: Once | INTRAMUSCULAR | Status: DC | PRN

## 2019-10-22 MED ORDER — LABETALOL HCL 5 MG/ML IV SOLN
5.0000 mg | INTRAVENOUS | Status: DC | PRN
  Administered 2019-10-22: 5 mg via INTRAVENOUS

## 2019-10-22 MED ORDER — ROPIVACAINE HCL 5 MG/ML IJ SOLN
INTRAMUSCULAR | Status: AC
  Filled 2019-10-22: qty 30

## 2019-10-22 MED ORDER — HYDROCODONE-ACETAMINOPHEN 5-325 MG PO TABS: 1.0000 | ORAL_TABLET | ORAL | 0 refills | Status: DC | PRN

## 2019-10-22 MED ORDER — CEFAZOLIN SODIUM-DEXTROSE 2-4 GM/100ML-% IV SOLN
2.0000 g | INTRAVENOUS | Status: AC
  Administered 2019-10-22: 13:00:00 2 g via INTRAVENOUS

## 2019-10-22 MED ORDER — SODIUM CHLORIDE 0.9 % IV SOLN: INTRAVENOUS | Status: DC

## 2019-10-22 MED ORDER — ONDANSETRON HCL 4 MG/2ML IJ SOLN: 4.0000 mg | Freq: Four times a day (QID) | INTRAMUSCULAR | Status: DC | PRN

## 2019-10-22 MED ORDER — FENTANYL CITRATE (PF) 100 MCG/2ML IJ SOLN
INTRAMUSCULAR | Status: AC
  Administered 2019-10-22: 25 ug via INTRAVENOUS
  Filled 2019-10-22: qty 2

## 2019-10-22 MED ORDER — HYDROCODONE-ACETAMINOPHEN 5-325 MG PO TABS: 1.0000 | ORAL_TABLET | Freq: Once | ORAL | Status: AC

## 2019-10-22 MED ORDER — LIDOCAINE HCL (PF) 1 % IJ SOLN
INTRAMUSCULAR | Status: DC | PRN
  Administered 2019-10-22: 3 mg via SUBCUTANEOUS

## 2019-10-22 MED ORDER — PROPOFOL 10 MG/ML IV BOLUS
INTRAVENOUS | Status: AC
  Filled 2019-10-22: qty 20

## 2019-10-22 MED ORDER — FENTANYL CITRATE (PF) 100 MCG/2ML IJ SOLN
INTRAMUSCULAR | Status: DC | PRN
  Administered 2019-10-22 (×4): 25 ug via INTRAVENOUS

## 2019-10-22 MED ORDER — MIDAZOLAM HCL 2 MG/2ML IJ SOLN
INTRAMUSCULAR | Status: AC
  Filled 2019-10-22: qty 2

## 2019-10-22 MED ORDER — FAMOTIDINE 20 MG PO TABS
ORAL_TABLET | ORAL | Status: AC
  Administered 2019-10-22: 20 mg via ORAL
  Filled 2019-10-22: qty 1

## 2019-10-22 MED ORDER — METOCLOPRAMIDE HCL 5 MG/ML IJ SOLN: 5.0000 mg | Freq: Three times a day (TID) | INTRAMUSCULAR | Status: DC | PRN

## 2019-10-22 MED ORDER — ONDANSETRON HCL 4 MG/2ML IJ SOLN
INTRAMUSCULAR | Status: AC
  Filled 2019-10-22: qty 2

## 2019-10-22 MED ORDER — MIDAZOLAM HCL 2 MG/2ML IJ SOLN: 2.0000 mg | Freq: Once | INTRAMUSCULAR | Status: DC

## 2019-10-22 MED ORDER — BUPIVACAINE HCL (PF) 0.5 % IJ SOLN
INTRAMUSCULAR | Status: AC
  Filled 2019-10-22: qty 30

## 2019-10-22 MED ORDER — ROPIVACAINE HCL 5 MG/ML IJ SOLN
INTRAMUSCULAR | Status: DC | PRN
  Administered 2019-10-22: 20 mL via PERINEURAL

## 2019-10-22 MED ORDER — LIDOCAINE HCL (CARDIAC) PF 100 MG/5ML IV SOSY
PREFILLED_SYRINGE | INTRAVENOUS | Status: DC | PRN
  Administered 2019-10-22: 60 mg via INTRAVENOUS

## 2019-10-22 MED ORDER — ONDANSETRON HCL 4 MG PO TABS: 4.0000 mg | ORAL_TABLET | Freq: Four times a day (QID) | ORAL | Status: DC | PRN

## 2019-10-22 MED ORDER — LIDOCAINE HCL (PF) 1 % IJ SOLN
INTRAMUSCULAR | Status: AC
  Filled 2019-10-22: qty 5

## 2019-10-22 MED ORDER — METOCLOPRAMIDE HCL 10 MG PO TABS: 5.0000 mg | ORAL_TABLET | Freq: Three times a day (TID) | ORAL | Status: DC | PRN

## 2019-10-22 MED ORDER — LACTATED RINGERS IV SOLN: INTRAVENOUS | Status: DC

## 2019-10-22 MED ORDER — FENTANYL CITRATE (PF) 100 MCG/2ML IJ SOLN
25.0000 ug | INTRAMUSCULAR | Status: AC | PRN
  Administered 2019-10-22 (×4): 25 ug via INTRAVENOUS

## 2019-10-22 MED ORDER — PROPOFOL 10 MG/ML IV BOLUS
INTRAVENOUS | Status: DC | PRN
  Administered 2019-10-22: 170 mg via INTRAVENOUS

## 2019-10-22 MED ORDER — CEFAZOLIN SODIUM-DEXTROSE 2-4 GM/100ML-% IV SOLN
INTRAVENOUS | Status: AC
  Filled 2019-10-22: qty 100

## 2019-10-22 MED ORDER — MIDAZOLAM HCL 2 MG/2ML IJ SOLN
INTRAMUSCULAR | Status: DC | PRN
  Administered 2019-10-22: 2 mg via INTRAVENOUS

## 2019-10-22 MED ORDER — DEXAMETHASONE SODIUM PHOSPHATE 10 MG/ML IJ SOLN
INTRAMUSCULAR | Status: DC | PRN
  Administered 2019-10-22: 10 mg via INTRAVENOUS

## 2019-10-22 MED ORDER — LABETALOL HCL 5 MG/ML IV SOLN
INTRAVENOUS | Status: AC
  Administered 2019-10-22: 5 mg via INTRAVENOUS
  Filled 2019-10-22: qty 4

## 2019-10-22 MED ORDER — MIDAZOLAM HCL 2 MG/2ML IJ SOLN
1.0000 mg | Freq: Once | INTRAMUSCULAR | Status: AC
  Administered 2019-10-22: 1 mg via INTRAVENOUS

## 2019-10-22 SURGICAL SUPPLY — 45 items
BIT DRILL 2 FAST STEP (BIT) ×2 IMPLANT
BIT DRILL 2.5X4 QC (BIT) ×2 IMPLANT
BNDG ELASTIC 3X5.8 VLCR STR LF (GAUZE/BANDAGES/DRESSINGS) ×3 IMPLANT
BNDG ELASTIC 4X5.8 VLCR STR LF (GAUZE/BANDAGES/DRESSINGS) ×3 IMPLANT
CANISTER SUCT 1200ML W/VALVE (MISCELLANEOUS) ×3 IMPLANT
CHLORAPREP W/TINT 26 (MISCELLANEOUS) ×3 IMPLANT
COVER WAND RF STERILE (DRAPES) ×3 IMPLANT
CUFF TOURN SGL QUICK 18X4 (TOURNIQUET CUFF) ×2 IMPLANT
DRAPE FLUOR MINI C-ARM 54X84 (DRAPES) ×3 IMPLANT
ELECT CAUTERY NDL 2.0 MIC (NEEDLE) IMPLANT
ELECT CAUTERY NEEDLE 2.0 MIC (NEEDLE) IMPLANT
ELECT REM PT RETURN 9FT ADLT (ELECTROSURGICAL) ×3
ELECTRODE REM PT RTRN 9FT ADLT (ELECTROSURGICAL) ×1 IMPLANT
GAUZE SPONGE 4X4 12PLY STRL (GAUZE/BANDAGES/DRESSINGS) ×3 IMPLANT
GAUZE XEROFORM 1X8 LF (GAUZE/BANDAGES/DRESSINGS) ×6 IMPLANT
GLOVE SURG SYN 9.0  PF PI (GLOVE) ×2
GLOVE SURG SYN 9.0 PF PI (GLOVE) ×1 IMPLANT
GOWN SRG 2XL LVL 4 RGLN SLV (GOWNS) ×1 IMPLANT
GOWN STRL NON-REIN 2XL LVL4 (GOWNS) ×2
GOWN STRL REUS W/ TWL LRG LVL3 (GOWN DISPOSABLE) ×1 IMPLANT
GOWN STRL REUS W/TWL LRG LVL3 (GOWN DISPOSABLE) ×2
K-WIRE 1.6 (WIRE) ×2
K-WIRE FX5X1.6XNS BN SS (WIRE) ×1
KIT TURNOVER KIT A (KITS) ×3 IMPLANT
KWIRE FX5X1.6XNS BN SS (WIRE) IMPLANT
NDL FILTER BLUNT 18X1 1/2 (NEEDLE) ×1 IMPLANT
NEEDLE FILTER BLUNT 18X 1/2SAF (NEEDLE) ×2
NEEDLE FILTER BLUNT 18X1 1/2 (NEEDLE) ×1 IMPLANT
NS IRRIG 500ML POUR BTL (IV SOLUTION) ×3 IMPLANT
PACK EXTREMITY ARMC (MISCELLANEOUS) ×3 IMPLANT
PAD CAST CTTN 4X4 STRL (SOFTGOODS) ×2 IMPLANT
PADDING CAST COTTON 4X4 STRL (SOFTGOODS) ×4
PEG SUBCHONDRAL SMOOTH 2.0X14 (Peg) ×2 IMPLANT
PEG SUBCHONDRAL SMOOTH 2.0X18 (Peg) ×2 IMPLANT
PEG SUBCHONDRAL SMOOTH 2.0X20 (Peg) ×2 IMPLANT
PLATE SHORT 21.6X48.9 NRRW LT (Plate) ×2 IMPLANT
SCALPEL PROTECTED #15 DISP (BLADE) ×6 IMPLANT
SCREW CORT 3.5X10 LNG (Screw) ×6 IMPLANT
SCREW MULTI DIRECT 18MM (Screw) ×4 IMPLANT
SPLINT CAST 1 STEP 3X12 (MISCELLANEOUS) ×3 IMPLANT
SUT ETHILON 4-0 (SUTURE) ×4
SUT ETHILON 4-0 FS2 18XMFL BLK (SUTURE) ×2
SUT VICRYL 3-0 27IN (SUTURE) ×3 IMPLANT
SUTURE ETHLN 4-0 FS2 18XMF BLK (SUTURE) ×1 IMPLANT
SYR 3ML LL SCALE MARK (SYRINGE) ×3 IMPLANT

## 2019-10-22 NOTE — Anesthesia Procedure Notes (Signed)
Anesthesia Regional Block: Supraclavicular block   Pre-Anesthetic Checklist: ,, timeout performed, Correct Patient, Correct Site, Correct Laterality, Correct Procedure, Correct Position, site marked, Risks and benefits discussed,  Surgical consent,  Pre-op evaluation,  At surgeon's request and post-op pain management  Laterality: Left and Upper  Prep: chloraprep       Needles:  Injection technique: Single-shot  Needle Type: Stimiplex     Needle Length: 5cm  Needle Gauge: 22     Additional Needles:   Procedures:,,,, ultrasound used (permanent image in chart),,,,  Narrative:  Start time: 10/22/2019 3:26 PM End time: 10/22/2019 3:30 PM Injection made incrementally with aspirations every 5 mL.  Performed by: Personally  Anesthesiologist: Martha Clan, MD  Additional Notes: Functioning IV was confirmed and monitors were applied.  A 62mm 22ga Stimuplex needle was used. Sterile prep and drape,hand hygiene and sterile gloves were used.  Negative aspiration and negative test dose prior to incremental administration of local anesthetic. The patient tolerated the procedure well.

## 2019-10-22 NOTE — H&P (Signed)
Reviewed paper H+P, will be scanned into chart. No changes noted.  

## 2019-10-22 NOTE — Anesthesia Preprocedure Evaluation (Addendum)
Anesthesia Evaluation  Patient identified by MRN, date of birth, ID band Patient awake    Reviewed: Allergy & Precautions, NPO status , Patient's Chart, lab work & pertinent test results, reviewed documented beta blocker date and time   History of Anesthesia Complications (+) PONV and history of anesthetic complications  Airway Mallampati: I  TM Distance: >3 FB     Dental  (+) Chipped, Dental Advidsory Given, Missing   Pulmonary neg shortness of breath, neg COPD, neg recent URI, Current Smoker,           Cardiovascular Exercise Tolerance: Good negative cardio ROS       Neuro/Psych  Headaches, Seizures - (once from tramadol), Well Controlled,  PSYCHIATRIC DISORDERS Anxiety  Neuromuscular disease    GI/Hepatic negative GI ROS, Neg liver ROS,   Endo/Other  negative endocrine ROS  Renal/GU Renal InsufficiencyRenal disease     Musculoskeletal   Abdominal   Peds  Hematology negative hematology ROS (+)   Anesthesia Other Findings Past Medical History: No date: Anemia No date: Anxiety No date: Chronic back pain No date: Ectopic pregnancy No date: Headache     Comment:  MIGRAINES No date: Kidney stone     Comment:  stent and lithotrisy 2009 No date: Lumbar radiculopathy No date: Mental disorder No date: PONV (postoperative nausea and vomiting)     Comment:  DURING KIDNEY STONE REMOVAL 2014: Seizures ([place])     Comment:  one occurence, no triggering event No date: UTI (urinary tract infection)   Reproductive/Obstetrics                             Anesthesia Physical  Anesthesia Plan  ASA: II  Anesthesia Plan: General   Post-op Pain Management:  Regional for Post-op pain   Induction: Intravenous  PONV Risk Score and Plan: 3 and Ondansetron, Dexamethasone, Midazolam and Treatment may vary due to age or medical condition  Airway Management Planned: LMA  Additional Equipment:    Intra-op Plan:   Post-operative Plan: Extubation in OR  Informed Consent: I have reviewed the patients History and Physical, chart, labs and discussed the procedure including the risks, benefits and alternatives for the proposed anesthesia with the patient or authorized representative who has indicated his/her understanding and acceptance.       Plan Discussed with: CRNA  Anesthesia Plan Comments:        Anesthesia Quick Evaluation

## 2019-10-22 NOTE — Op Note (Signed)
10/22/2019  2:22 PM  PATIENT:  [name]  43 y.o. female  PRE-OPERATIVE DIAGNOSIS:  CLOSED INTRA-ARTICULAR FRACTURE OF DISTAL END OF LEFT RADIUS. CARPAL TUNNEL SYNDROME, LEFT.  POST-OPERATIVE DIAGNOSIS:  CLOSED INTRA-ARTICULAR FRACTURE OF DISTAL END OF LEFT RADIUS  PROCEDURE:  Procedure(s): OPEN REDUCTION INTERNAL FIXATION (ORIF) DISTAL RADIAL FRACTURE (Left) CARPAL TUNNEL RELEASE (Left)  SURGEON: Laurene Footman, MD  ASSISTANTS: None  ANESTHESIA:   general  EBL:  No intake/output data recorded.  BLOOD ADMINISTERED:none  DRAINS: none   LOCAL MEDICATIONS USED:  MARCAINE    and Amount: 10 ml  SPECIMEN:  No Specimen  DISPOSITION OF SPECIMEN:  N/A  COUNTS:  YES  TOURNIQUET:   Total Tourniquet Time Documented: Upper Arm (Left) - 39 minutes Total: Upper Arm (Left) - 39 minutes   IMPLANTS: [employer] short narrow DVR with multiple smooth pegs multidirectional screws and 10 mm cortical screws  DICTATION: .Dragon Dictation patient was brought to the operating room and after adequate anesthesia was obtained the left arm was prepped and draped in the usual sterile manner.  After patient identification and timeout procedures were completed tourniquet was raised with traction applied to the end of the table with 10 pounds to help restore length.  Volar approach was made centered over the FCR tendon with the tendon sheath incised.  The tendon was retracted radially and the deep fascia incised to expose the pronator.  This was elevated off the radial side and the fracture was quite impacted and very distal the plate was positioned so it would the distal row pegs would be in the subchondral bone and after pinning and positioning getting on the right spot smooth pegs and multidirectional pegs were placed in the distal fragment first and then the plate brought down to the shaft to restore some of the volar tilt length and some volar tilt were restored along with radial inclination.   The PEG into the radial styloid appeared to penetrate radially and so it was removed otherwise all screws left in place 10 mm screws were placed in the shaft and with traction released the fracture appeared stable.  The wound was irrigated and then the carpal tunnel was released with approximately 2 cm incision in the palm dividing the transverse carpal ligament placing a vascular hemostat placed deep releasing scar proximally and distally it appeared proximally there was good compression.  This wound was infiltrated with 10 cc of half percent Sensorcaine and then closed with simple erupted 4-0 nylon the tourniquet was let down at this point and the incision from the fracture ORIF was closed with 3-0 Vicryl subcutaneously followed by simple interrupted 4-0 nylon Xeroform 4 x 4's web roll volar splint and Ace wrap applied  PLAN OF CARE: Discharge to home after PACU  PATIENT DISPOSITION:  PACU - hemodynamically stable.

## 2019-10-22 NOTE — Transfer of Care (Signed)
Immediate Anesthesia Transfer of Care Note  Patient: [name]  Procedure(s) Performed: OPEN REDUCTION INTERNAL FIXATION (ORIF) DISTAL RADIAL FRACTURE (Left Wrist) CARPAL TUNNEL RELEASE (Left Wrist)  Patient Location: PACU  Anesthesia Type:General  Level of Consciousness: awake, alert  and oriented  Airway & Oxygen Therapy: Patient Spontanous Breathing and Patient connected to face mask oxygen  Post-op Assessment: Report given to RN and Post -op Vital signs reviewed and stable  Post vital signs: Reviewed and stable  Last Vitals:  Vitals Value Taken Time  BP 155/108 10/22/19 1425  Temp    Pulse 97 10/22/19 1428  Resp 16 10/22/19 1428  SpO2 99 % 10/22/19 1428  Vitals shown include unvalidated device data.  Last Pain:  Vitals:   10/22/19 1248  TempSrc:   PainSc: 7          Complications: No apparent anesthesia complications

## 2019-10-22 NOTE — Discharge Instructions (Addendum)
AMBULATORY SURGERY  DISCHARGE INSTRUCTIONS   1) The drugs that you were given will stay in your system until tomorrow so for the next 24 hours you should not:  A) Drive an automobile B) Make any legal decisions C) Drink any alcoholic beverage   2) You may resume regular meals tomorrow.  Today it is better to start with liquids and gradually work up to solid foods.  You may eat anything you prefer, but it is better to start with liquids, then soup and crackers, and gradually work up to solid foods.   3) Please notify your doctor immediately if you have any unusual bleeding, trouble breathing, redness and pain at the surgery site, drainage, fever, or pain not relieved by medication. 4)   5) Your post-operative visit with Dr.                                     is: Date:                        Time:    Please call to schedule your post-operative visit.  6) Additional Instructions:    Keep arm elevated is much as possible.  Ice to the back of the wrist today and tomorrow.  Work on finger motion is much as you can.  Pain medicine as directed.  Fingers should be numb for 6 to 8 hours from local anesthetic.  Keep dressing clean and dry

## 2019-10-22 NOTE — Anesthesia Procedure Notes (Signed)
Procedure Name: LMA Insertion Date/Time: 10/22/2019 1:17 PM Performed by: Jerrye Noble, CRNA Pre-anesthesia Checklist: Patient identified, Emergency Drugs available, Suction available and Patient being monitored Patient Re-evaluated:Patient Re-evaluated prior to induction Oxygen Delivery Method: Circle system utilized Preoxygenation: Pre-oxygenation with 100% oxygen Induction Type: IV induction Ventilation: Mask ventilation without difficulty LMA: LMA inserted LMA Size: 3.5 Number of attempts: 2 Placement Confirmation: positive ETCO2 Tube secured with: Tape Dental Injury: Teeth and Oropharynx as per pre-operative assessment

## 2019-10-22 NOTE — OR Nursing (Signed)
Incentive spirometer given with instruction, return demonstration.

## 2019-10-22 NOTE — Anesthesia Post-op Follow-up Note (Signed)
Anesthesia QCDR form completed.        

## 2019-10-23 NOTE — Anesthesia Postprocedure Evaluation (Signed)
Anesthesia Post Note  Patient: [name]  Procedure(s) Performed: OPEN REDUCTION INTERNAL FIXATION (ORIF) DISTAL RADIAL FRACTURE (Left Wrist) CARPAL TUNNEL RELEASE (Left Wrist)  Patient location during evaluation: PACU Anesthesia Type: General Level of consciousness: awake and alert Pain management: pain level controlled Vital Signs Assessment: post-procedure vital signs reviewed and stable Respiratory status: spontaneous breathing, nonlabored ventilation, respiratory function stable and patient connected to nasal cannula oxygen Cardiovascular status: blood pressure returned to baseline and stable Postop Assessment: no apparent nausea or vomiting Anesthetic complications: no     Last Vitals:  Vitals:   10/22/19 1729 10/22/19 1753  BP: 105/68 107/74  Pulse: (!) 106 98  Resp: 16 16  Temp:    SpO2: 96% 98%    Last Pain:  Vitals:   10/23/19 0931  TempSrc:   PainSc: 5                  Martha Clan

## 2019-12-29 ENCOUNTER — Encounter
Admission: RE | Admit: 2019-12-29 | Discharge: 2019-12-29 | Disposition: A | Discharge status: Home / Self Care | Payer: Medicaid Other | Source: Ambulatory Visit | Attending: Orthopedic Surgery | Admitting: Orthopedic Surgery

## 2019-12-29 ENCOUNTER — Other Ambulatory Visit: Payer: Self-pay

## 2019-12-29 ENCOUNTER — Other Ambulatory Visit: Payer: Self-pay | Admitting: Orthopedic Surgery

## 2019-12-29 DIAGNOSIS — Z20822 Contact with and (suspected) exposure to covid-19: Secondary | ICD-10-CM | POA: Insufficient documentation

## 2019-12-29 DIAGNOSIS — Z01812 Encounter for preprocedural laboratory examination: Secondary | ICD-10-CM | POA: Insufficient documentation

## 2019-12-29 HISTORY — DX: Personal history of urinary calculi: Z87.442

## 2019-12-29 NOTE — Patient Instructions (Signed)
Your procedure is scheduled on: 12/31/19 Report to [place]. To find out your arrival time please call [phone] between 1PM - 3PM on 12/30/19.  Remember: Instructions that are not followed completely may result in serious medical risk, up to and including death, or upon the discretion of your surgeon and anesthesiologist your surgery may need to be rescheduled.     _X__ 1. Do not eat food after midnight the night before your procedure.                 No gum chewing or hard candies. You may drink clear liquids up to 2 hours                 before you are scheduled to arrive for your surgery- DO not drink clear                 liquids within 2 hours of the start of your surgery.                 Clear Liquids include:  water, apple juice without pulp, clear carbohydrate                 drink such as Clearfast or Gatorade, Black Coffee or Tea (Do not add                 anything to coffee or tea). Diabetics water only  __X__2.  On the morning of surgery brush your teeth with toothpaste and water, you                 may rinse your mouth with mouthwash if you wish.  Do not swallow any              toothpaste of mouthwash.     _X__ 3.  No Alcohol for 24 hours before or after surgery.   _X__ 4.  Do Not Smoke or use e-cigarettes For 24 Hours Prior to Your Surgery.                 Do not use any chewable tobacco products for at least 6 hours prior to                 surgery.  ____  5.  Bring all medications with you on the day of surgery if instructed.   __X__  6.  Notify your doctor if there is any change in your medical condition      (cold, fever, infections).     Do not wear jewelry, make-up, hairpins, clips or nail polish. Do not wear lotions, powders, or perfumes.  Do not shave 48 hours prior to surgery. Men may shave face and neck. Do not bring valuables to the hospital.    [facility] is not responsible for any belongings or  valuables.  Contacts, dentures/partials or body piercings may not be worn into surgery. Bring a case for your contacts, glasses or hearing aids, a denture cup will be supplied. Leave your suitcase in the car. After surgery it may be brought to your room. For patients admitted to the hospital, discharge time is determined by your treatment team.   Patients discharged the day of surgery will not be allowed to drive home.   Please read over the following fact sheets that you were given:   MRSA Information  __X__ Take these medicines the morning of surgery with A SIP OF WATER:  1. none  2.   3.   4.  5.  6.  ____ Fleet Enema (as directed)   __X__ Use CHG Soap/SAGE wipes as directed  ____ Use inhalers on the day of surgery  ____ Stop metformin/Janumet/Farxiga 2 days prior to surgery    ____ Take 1/2 of usual insulin dose the night before surgery. No insulin the morning          of surgery.   ____ Stop Blood Thinners Coumadin/Plavix/Xarelto/Pleta/Pradaxa/Eliquis/Effient/Aspirin  on   Or contact your Surgeon, Cardiologist or Medical Doctor regarding  ability to stop your blood thinners  __X__ Stop Anti-inflammatories 7 days before surgery such as Advil, Ibuprofen, Motrin,  BC or Goodies Powder, Naprosyn, Naproxen, Aleve, Aspirin    __X__ Stop all herbal supplements, fish oil or vitamin E until after surgery.    ____ Bring C-Pap to the hospital.

## 2019-12-30 ENCOUNTER — Encounter
Admission: RE | Admit: 2019-12-30 | Discharge: 2019-12-30 | Disposition: A | Discharge status: Home / Self Care | Payer: Medicaid Other | Source: Ambulatory Visit | Attending: Orthopedic Surgery | Admitting: Orthopedic Surgery

## 2019-12-30 DIAGNOSIS — Z20822 Contact with and (suspected) exposure to covid-19: Secondary | ICD-10-CM | POA: Diagnosis not present

## 2019-12-30 DIAGNOSIS — Z01812 Encounter for preprocedural laboratory examination: Secondary | ICD-10-CM | POA: Diagnosis not present

## 2019-12-30 LAB — SARS CORONAVIRUS 2 (TAT 6-24 HRS): SARS Coronavirus 2: NEGATIVE

## 2019-12-31 ENCOUNTER — Ambulatory Visit: Payer: Medicaid Other | Admitting: Anesthesiology

## 2019-12-31 ENCOUNTER — Ambulatory Visit: Payer: Medicaid Other

## 2019-12-31 ENCOUNTER — Encounter: Payer: Self-pay | Admitting: Orthopedic Surgery

## 2019-12-31 ENCOUNTER — Encounter
Admission: RE | Disposition: A | Discharge status: Home / Self Care | Payer: Self-pay | Source: Home / Self Care | Attending: Orthopedic Surgery

## 2019-12-31 ENCOUNTER — Ambulatory Visit
Admission: RE | Admit: 2019-12-31 | Discharge: 2019-12-31 | Disposition: A | Discharge status: Home / Self Care | Payer: Medicaid Other | Attending: Orthopedic Surgery | Admitting: Orthopedic Surgery

## 2019-12-31 ENCOUNTER — Other Ambulatory Visit: Payer: Self-pay

## 2019-12-31 DIAGNOSIS — Z885 Allergy status to narcotic agent status: Secondary | ICD-10-CM | POA: Insufficient documentation

## 2019-12-31 DIAGNOSIS — Z888 Allergy status to other drugs, medicaments and biological substances status: Secondary | ICD-10-CM | POA: Diagnosis not present

## 2019-12-31 DIAGNOSIS — Z9889 Other specified postprocedural states: Secondary | ICD-10-CM | POA: Insufficient documentation

## 2019-12-31 DIAGNOSIS — X58XXXA Exposure to other specified factors, initial encounter: Secondary | ICD-10-CM | POA: Insufficient documentation

## 2019-12-31 DIAGNOSIS — T8484XA Pain due to internal orthopedic prosthetic devices, implants and grafts, initial encounter: Secondary | ICD-10-CM | POA: Diagnosis not present

## 2019-12-31 DIAGNOSIS — Z8781 Personal history of (healed) traumatic fracture: Secondary | ICD-10-CM | POA: Diagnosis not present

## 2019-12-31 HISTORY — PX: HARDWARE REMOVAL: SHX979

## 2019-12-31 LAB — POCT PREGNANCY, URINE: Preg Test, Ur: NEGATIVE

## 2019-12-31 SURGERY — REMOVAL, HARDWARE
Anesthesia: General | Site: Wrist | Laterality: Left

## 2019-12-31 MED ORDER — PROPOFOL 10 MG/ML IV BOLUS
INTRAVENOUS | Status: AC
  Filled 2019-12-31: qty 40

## 2019-12-31 MED ORDER — CHLORHEXIDINE GLUCONATE 4 % EX LIQD: 60.0000 mL | Freq: Once | CUTANEOUS | Status: DC

## 2019-12-31 MED ORDER — FENTANYL CITRATE (PF) 100 MCG/2ML IJ SOLN
INTRAMUSCULAR | Status: DC | PRN
  Administered 2019-12-31 (×3): 25 ug via INTRAVENOUS

## 2019-12-31 MED ORDER — FENTANYL CITRATE (PF) 100 MCG/2ML IJ SOLN
INTRAMUSCULAR | Status: AC
  Filled 2019-12-31: qty 2

## 2019-12-31 MED ORDER — ONDANSETRON HCL 4 MG/2ML IJ SOLN
INTRAMUSCULAR | Status: DC | PRN
  Administered 2019-12-31: 4 mg via INTRAVENOUS

## 2019-12-31 MED ORDER — CEFAZOLIN SODIUM-DEXTROSE 2-4 GM/100ML-% IV SOLN
2.0000 g | INTRAVENOUS | Status: AC
  Administered 2019-12-31: 10:00:00 2 g via INTRAVENOUS

## 2019-12-31 MED ORDER — OXYCODONE HCL 5 MG PO TABS
ORAL_TABLET | ORAL | Status: AC
  Filled 2019-12-31: qty 1

## 2019-12-31 MED ORDER — OXYCODONE HCL 5 MG/5ML PO SOLN: 5.0000 mg | Freq: Once | ORAL | Status: AC | PRN

## 2019-12-31 MED ORDER — HYDROCODONE-ACETAMINOPHEN 5-325 MG PO TABS: 1.0000 | ORAL_TABLET | ORAL | 0 refills | Status: DC | PRN

## 2019-12-31 MED ORDER — MIDAZOLAM HCL 2 MG/2ML IJ SOLN
INTRAMUSCULAR | Status: AC
  Filled 2019-12-31: qty 2

## 2019-12-31 MED ORDER — DEXMEDETOMIDINE HCL IN NACL 80 MCG/20ML IV SOLN
INTRAVENOUS | Status: AC
  Filled 2019-12-31: qty 20

## 2019-12-31 MED ORDER — FAMOTIDINE 20 MG PO TABS
ORAL_TABLET | ORAL | Status: AC
  Administered 2019-12-31: 10:00:00 20 mg via ORAL
  Filled 2019-12-31: qty 1

## 2019-12-31 MED ORDER — LIDOCAINE HCL (CARDIAC) PF 100 MG/5ML IV SOSY
PREFILLED_SYRINGE | INTRAVENOUS | Status: DC | PRN
  Administered 2019-12-31: 80 mg via INTRAVENOUS

## 2019-12-31 MED ORDER — ACETAMINOPHEN 10 MG/ML IV SOLN
INTRAVENOUS | Status: DC | PRN
  Administered 2019-12-31: 1000 mg via INTRAVENOUS

## 2019-12-31 MED ORDER — SODIUM CHLORIDE 0.9 % IV SOLN: INTRAVENOUS | Status: DC

## 2019-12-31 MED ORDER — DEXAMETHASONE SODIUM PHOSPHATE 10 MG/ML IJ SOLN
INTRAMUSCULAR | Status: DC | PRN
  Administered 2019-12-31: 10 mg via INTRAVENOUS

## 2019-12-31 MED ORDER — DEXMEDETOMIDINE HCL 200 MCG/2ML IV SOLN
INTRAVENOUS | Status: DC | PRN
  Administered 2019-12-31: 8 ug via INTRAVENOUS
  Administered 2019-12-31: 4 ug via INTRAVENOUS

## 2019-12-31 MED ORDER — FENTANYL CITRATE (PF) 100 MCG/2ML IJ SOLN
INTRAMUSCULAR | Status: AC
  Administered 2019-12-31: 25 ug via INTRAVENOUS
  Filled 2019-12-31: qty 2

## 2019-12-31 MED ORDER — MIDAZOLAM HCL 2 MG/2ML IJ SOLN
INTRAMUSCULAR | Status: DC | PRN
  Administered 2019-12-31: 2 mg via INTRAVENOUS

## 2019-12-31 MED ORDER — FENTANYL CITRATE (PF) 100 MCG/2ML IJ SOLN
25.0000 ug | INTRAMUSCULAR | Status: DC | PRN
  Administered 2019-12-31 (×2): 25 ug via INTRAVENOUS
  Administered 2019-12-31: 50 ug via INTRAVENOUS
  Administered 2019-12-31: 11:00:00 25 ug via INTRAVENOUS

## 2019-12-31 MED ORDER — METOCLOPRAMIDE HCL 10 MG PO TABS: 5.0000 mg | ORAL_TABLET | Freq: Three times a day (TID) | ORAL | Status: DC | PRN

## 2019-12-31 MED ORDER — CEFAZOLIN SODIUM-DEXTROSE 2-4 GM/100ML-% IV SOLN
INTRAVENOUS | Status: AC
  Filled 2019-12-31: qty 100

## 2019-12-31 MED ORDER — OXYCODONE HCL 5 MG PO TABS
5.0000 mg | ORAL_TABLET | Freq: Once | ORAL | Status: AC | PRN
  Administered 2019-12-31: 12:00:00 5 mg via ORAL

## 2019-12-31 MED ORDER — PROPOFOL 10 MG/ML IV BOLUS
INTRAVENOUS | Status: DC | PRN
  Administered 2019-12-31: 140 mg via INTRAVENOUS

## 2019-12-31 MED ORDER — ONDANSETRON HCL 4 MG PO TABS: 4.0000 mg | ORAL_TABLET | Freq: Four times a day (QID) | ORAL | Status: DC | PRN

## 2019-12-31 MED ORDER — METOCLOPRAMIDE HCL 5 MG/ML IJ SOLN: 5.0000 mg | Freq: Three times a day (TID) | INTRAMUSCULAR | Status: DC | PRN

## 2019-12-31 MED ORDER — PHENYLEPHRINE HCL (PRESSORS) 10 MG/ML IV SOLN
INTRAVENOUS | Status: DC | PRN
  Administered 2019-12-31: 100 ug via INTRAVENOUS

## 2019-12-31 MED ORDER — LACTATED RINGERS IV SOLN: INTRAVENOUS | Status: DC

## 2019-12-31 MED ORDER — LIDOCAINE HCL (PF) 2 % IJ SOLN
INTRAMUSCULAR | Status: AC
  Filled 2019-12-31: qty 5

## 2019-12-31 MED ORDER — ONDANSETRON HCL 4 MG/2ML IJ SOLN: 4.0000 mg | Freq: Four times a day (QID) | INTRAMUSCULAR | Status: DC | PRN

## 2019-12-31 MED ORDER — ONDANSETRON HCL 4 MG/2ML IJ SOLN
INTRAMUSCULAR | Status: AC
  Filled 2019-12-31: qty 2

## 2019-12-31 MED ORDER — DEXAMETHASONE SODIUM PHOSPHATE 10 MG/ML IJ SOLN
INTRAMUSCULAR | Status: AC
  Filled 2019-12-31: qty 1

## 2019-12-31 MED ORDER — FAMOTIDINE 20 MG PO TABS: 20.0000 mg | ORAL_TABLET | Freq: Once | ORAL | Status: AC

## 2019-12-31 SURGICAL SUPPLY — 43 items
CANISTER SUCT 1200ML W/VALVE (MISCELLANEOUS) ×3 IMPLANT
CHLORAPREP W/TINT 26 (MISCELLANEOUS) ×3 IMPLANT
COVER WAND RF STERILE (DRAPES) ×3 IMPLANT
CUFF TOURN SGL QUICK 18 (TOURNIQUET CUFF) ×3 IMPLANT
CUFF TOURN SGL QUICK 24 (TOURNIQUET CUFF)
CUFF TOURN SGL QUICK 30 (TOURNIQUET CUFF)
CUFF TRNQT CYL 24X4X16.5-23 (TOURNIQUET CUFF) IMPLANT
CUFF TRNQT CYL 30X4X21-28X (TOURNIQUET CUFF) IMPLANT
DRAPE C-ARM XRAY 36X54 (DRAPES) ×3 IMPLANT
DRAPE INCISE IOBAN 66X45 STRL (DRAPES) IMPLANT
DRSG EMULSION OIL 3X8 NADH (GAUZE/BANDAGES/DRESSINGS) ×3 IMPLANT
ELECT CAUTERY BLADE 6.4 (BLADE) ×3 IMPLANT
ELECT REM PT RETURN 9FT ADLT (ELECTROSURGICAL) ×3
ELECTRODE REM PT RTRN 9FT ADLT (ELECTROSURGICAL) ×1 IMPLANT
GAUZE SPONGE 4X4 12PLY STRL (GAUZE/BANDAGES/DRESSINGS) ×3 IMPLANT
GAUZE XEROFORM 1X8 LF (GAUZE/BANDAGES/DRESSINGS) ×3 IMPLANT
GLOVE SURG SYN 9.0  PF PI (GLOVE) ×2
GLOVE SURG SYN 9.0 PF PI (GLOVE) ×1 IMPLANT
GOWN SRG 2XL LVL 4 RGLN SLV (GOWNS) ×1 IMPLANT
GOWN STRL NON-REIN 2XL LVL4 (GOWNS) ×2
GOWN STRL REUS W/ TWL LRG LVL3 (GOWN DISPOSABLE) ×1 IMPLANT
GOWN STRL REUS W/TWL LRG LVL3 (GOWN DISPOSABLE) ×2
KIT TURNOVER KIT A (KITS) ×3 IMPLANT
NEEDLE FILTER BLUNT 18X 1/2SAF (NEEDLE) ×2
NEEDLE FILTER BLUNT 18X1 1/2 (NEEDLE) ×1 IMPLANT
NS IRRIG 1000ML POUR BTL (IV SOLUTION) ×3 IMPLANT
PACK EXTREMITY ARMC (MISCELLANEOUS) ×3 IMPLANT
PAD ABD DERMACEA PRESS 5X9 (GAUZE/BANDAGES/DRESSINGS) ×6 IMPLANT
SCALPEL PROTECTED #15 DISP (BLADE) ×6 IMPLANT
STAPLER SKIN PROX 35W (STAPLE) ×3 IMPLANT
STOCKINETTE BIAS CUT 3 980034 (MISCELLANEOUS) ×3 IMPLANT
SUT ETHIBOND NAB CT1 #1 30IN (SUTURE) ×3 IMPLANT
SUT ETHILON 3-0 FS-10 30 BLK (SUTURE) ×3
SUT ETHILON 4-0 (SUTURE) ×2
SUT ETHILON 4-0 FS2 18XMFL BLK (SUTURE) ×1
SUT VIC AB 0 CT1 36 (SUTURE) ×3 IMPLANT
SUT VIC AB 2-0 CT1 27 (SUTURE) ×2
SUT VIC AB 2-0 CT1 TAPERPNT 27 (SUTURE) ×1 IMPLANT
SUT VICRYL AB 3-0 FS1 BRD 27IN (SUTURE) ×3 IMPLANT
SUTURE EHLN 3-0 FS-10 30 BLK (SUTURE) ×1 IMPLANT
SUTURE ETHLN 4-0 FS2 18XMF BLK (SUTURE) ×1 IMPLANT
SYR 10ML LL (SYRINGE) ×3 IMPLANT
WATER STERILE IRR 1000ML POUR (IV SOLUTION) ×3 IMPLANT

## 2019-12-31 NOTE — OR Nursing (Signed)
Patient able to move fingers on operative hand, fingers warm and cap refill brisk.

## 2019-12-31 NOTE — Transfer of Care (Signed)
Immediate Anesthesia Transfer of Care Note  Patient: [name]  Procedure(s) Performed: LEFT WRIST HARDWARE REMOVAL (Left Wrist)  Patient Location: PACU  Anesthesia Type:General  Level of Consciousness: sedated  Airway & Oxygen Therapy: Patient connected to face mask oxygen  Post-op Assessment: Post -op Vital signs reviewed and stable  Post vital signs: stable  Last Vitals:  Vitals Value Taken Time  BP    Temp    Pulse 68 12/31/19 1056  Resp 13 12/31/19 1056  SpO2 100 % 12/31/19 1056  Vitals shown include unvalidated device data.  Last Pain:  Vitals:   12/31/19 0923  TempSrc: Tympanic  PainSc: 3          Complications: No apparent anesthesia complications

## 2019-12-31 NOTE — H&P (Signed)
Reviewed paper H+P, will be scanned into chart. No changes noted.  

## 2019-12-31 NOTE — Anesthesia Preprocedure Evaluation (Signed)
Anesthesia Evaluation  Patient identified by MRN, date of birth, ID band Patient awake    Reviewed: Allergy & Precautions, H&P , NPO status , Patient's Chart, lab work & pertinent test results, reviewed documented beta blocker date and time   History of Anesthesia Complications (+) PONV and history of anesthetic complications  Airway Mallampati: II  TM Distance: >3 FB Neck ROM: full    Dental  (+) Chipped, Dental Advidsory Given, Missing   Pulmonary neg pulmonary ROS, neg shortness of breath, neg COPD, neg recent URI, Current Smoker, former smoker,           Cardiovascular Exercise Tolerance: Good      Neuro/Psych  Headaches, Seizures - (once from tramadol), Well Controlled,  PSYCHIATRIC DISORDERS Anxiety  Neuromuscular disease    GI/Hepatic negative GI ROS, Neg liver ROS, neg GERD  ,  Endo/Other  negative endocrine ROS  Renal/GU Renal InsufficiencyRenal disease     Musculoskeletal   Abdominal   Peds  Hematology negative hematology ROS (+)   Anesthesia Other Findings Past Medical History: No date: Anemia No date: Anxiety No date: Chronic back pain No date: Ectopic pregnancy No date: Headache     Comment:  MIGRAINES No date: Kidney stone     Comment:  stent and lithotrisy 2009 No date: Lumbar radiculopathy No date: Mental disorder No date: PONV (postoperative nausea and vomiting)     Comment:  DURING KIDNEY STONE REMOVAL 2014: Seizures ([facility])     Comment:  one occurence, no triggering event No date: UTI (urinary tract infection)   Reproductive/Obstetrics negative OB ROS                             Anesthesia Physical  Anesthesia Plan  ASA: II  Anesthesia Plan: General   Post-op Pain Management:  Regional for Post-op pain   Induction: Intravenous  PONV Risk Score and Plan: 3 and Ondansetron, Dexamethasone, Midazolam and Treatment may vary due to age or medical  condition  Airway Management Planned: LMA  Additional Equipment:   Intra-op Plan:   Post-operative Plan: Extubation in OR  Informed Consent: I have reviewed the patients History and Physical, chart, labs and discussed the procedure including the risks, benefits and alternatives for the proposed anesthesia with the patient or authorized representative who has indicated his/her understanding and acceptance.     Dental Advisory Given  Plan Discussed with: CRNA  Anesthesia Plan Comments: (Patient consented for risks of anesthesia including but not limited to:  - adverse reactions to medications - damage to teeth, lips or other oral mucosa - sore throat or hoarseness - Damage to heart, brain, lungs or loss of life  Patient voiced understanding.)        Anesthesia Quick Evaluation

## 2019-12-31 NOTE — Anesthesia Postprocedure Evaluation (Signed)
Anesthesia Post Note  Patient: Forestine Renaldo Reel  Procedure(s) Performed: LEFT WRIST HARDWARE REMOVAL (Left Wrist)  Patient location during evaluation: PACU Anesthesia Type: General Level of consciousness: awake and alert Pain management: pain level controlled Vital Signs Assessment: post-procedure vital signs reviewed and stable Respiratory status: spontaneous breathing, nonlabored ventilation, respiratory function stable and patient connected to nasal cannula oxygen Cardiovascular status: blood pressure returned to baseline and stable Postop Assessment: no apparent nausea or vomiting Anesthetic complications: no     Last Vitals:  Vitals:   12/31/19 1155 12/31/19 1205  BP:  129/90  Pulse: 85 87  Resp: 11 18  Temp:  36.4 C  SpO2: 94% 98%    Last Pain:  Vitals:   12/31/19 1205  TempSrc: Temporal  PainSc: 4                  Cleda Mccreedy Jaymar Loeber

## 2019-12-31 NOTE — Anesthesia Procedure Notes (Signed)
Procedure Name: LMA Insertion Date/Time: 12/31/2019 10:16 AM Performed by: Irving Burton, CRNA Pre-anesthesia Checklist: Patient identified, Emergency Drugs available, Suction available and Patient being monitored Patient Re-evaluated:Patient Re-evaluated prior to induction Oxygen Delivery Method: Circle system utilized Preoxygenation: Pre-oxygenation with 100% oxygen Induction Type: IV induction Ventilation: Mask ventilation without difficulty LMA: LMA inserted LMA Size: 4.0 Number of attempts: 1 Placement Confirmation: positive ETCO2 and breath sounds checked- equal and bilateral Tube secured with: Tape Dental Injury: Teeth and Oropharynx as per pre-operative assessment

## 2019-12-31 NOTE — Discharge Instructions (Addendum)
Work on finger motion and wrist motion is much as you can tolerate.  Loosen Ace wrap if fingers swell.  Pain medicine as directed.  Keep dressing clean and dry until recheck.   AMBULATORY SURGERY  DISCHARGE INSTRUCTIONS   1) The drugs that you were given will stay in your system until tomorrow so for the next 24 hours you should not:  A) Drive an automobile B) Make any legal decisions C) Drink any alcoholic beverage   2) You may resume regular meals tomorrow.  Today it is better to start with liquids and gradually work up to solid foods.  You may eat anything you prefer, but it is better to start with liquids, then soup and crackers, and gradually work up to solid foods.   3) Please notify your doctor immediately if you have any unusual bleeding, trouble breathing, redness and pain at the surgery site, drainage, fever, or pain not relieved by medication.    4) Additional Instructions:        Please contact your physician with any problems or Same Day Surgery at [phone], Monday through Friday 6 am to 4 pm, or [place] at [facility] number at [phone].

## 2019-12-31 NOTE — Op Note (Signed)
12/31/2019  10:57 AM  PATIENT:  [name]  44 y.o. female  PRE-OPERATIVE DIAGNOSIS:  S/P open reduction internal fixation fracture, painful hardware left distal radius  POST-OPERATIVE DIAGNOSIS:  S/P open reduction internal fixation fracture same  PROCEDURE:  Procedure(s): LEFT WRIST HARDWARE REMOVAL (Left)  SURGEON: Leitha Schuller, MD  ASSISTANTS: None  ANESTHESIA:   general  EBL:  No intake/output data recorded.  BLOOD ADMINISTERED:none  DRAINS: none   LOCAL MEDICATIONS USED:  NONE  SPECIMEN:  No Specimen  DISPOSITION OF SPECIMEN:  N/A  COUNTS:  YES  TOURNIQUET:   Total Tourniquet Time Documented: Upper Arm (Left) - 17 minutes Total: Upper Arm (Left) - 17 minutes   IMPLANTS: None  DICTATION: .Dragon Dictation patient was brought to the operating room and after adequate general anesthesia was obtained the left arm was prepped and draped in the usual sterile fashion.  After patient identification and timeout procedures were completed, tourniquet was raised.  Prior incision was opened and there was scar tissue overlying the FCR tendon and it was exposed the underlying scar tissue was separated and the plate exposed without difficulty.  The proximal large screws were removed and then the distal pegs were removed.  Examination of the fracture site.  To show healed fracture.  The mini C arm was brought in and under range of motion there did not appear to be any motion of the distal radius.  The DRUJ was opened with a Therapist, nutritional and the full pronation and supination was obtained.  The wound was thoroughly irrigated and tourniquet let down.  Wound was closed with 3-0 Vicryl subcutaneously and 4-0 nylon in a simple interrupted fashion.  Xeroform 4 x 4 ABD web roll and Ace wrap applied  PLAN OF CARE: Discharge to home after PACU  PATIENT DISPOSITION:  PACU - hemodynamically stable.

## 2021-03-01 ENCOUNTER — Emergency Department: Payer: Medicaid Other

## 2021-03-01 ENCOUNTER — Other Ambulatory Visit: Payer: Self-pay

## 2021-03-01 ENCOUNTER — Observation Stay
Admission: EM | Admit: 2021-03-01 | Discharge: 2021-03-02 | Disposition: A | Discharge status: Home / Self Care | Payer: Medicaid Other | Attending: Obstetrics and Gynecology | Admitting: Obstetrics and Gynecology

## 2021-03-01 DIAGNOSIS — R7401 Elevation of levels of liver transaminase levels: Secondary | ICD-10-CM | POA: Insufficient documentation

## 2021-03-01 DIAGNOSIS — R109 Unspecified abdominal pain: Secondary | ICD-10-CM | POA: Diagnosis present

## 2021-03-01 DIAGNOSIS — Z87891 Personal history of nicotine dependence: Secondary | ICD-10-CM | POA: Insufficient documentation

## 2021-03-01 DIAGNOSIS — K760 Fatty (change of) liver, not elsewhere classified: Principal | ICD-10-CM | POA: Insufficient documentation

## 2021-03-01 DIAGNOSIS — R6510 Systemic inflammatory response syndrome (SIRS) of non-infectious origin without acute organ dysfunction: Secondary | ICD-10-CM | POA: Diagnosis not present

## 2021-03-01 DIAGNOSIS — R000 Tachycardia, unspecified: Secondary | ICD-10-CM | POA: Insufficient documentation

## 2021-03-01 DIAGNOSIS — R1031 Right lower quadrant pain: Secondary | ICD-10-CM | POA: Diagnosis not present

## 2021-03-01 DIAGNOSIS — Z20822 Contact with and (suspected) exposure to covid-19: Secondary | ICD-10-CM | POA: Diagnosis not present

## 2021-03-01 LAB — BASIC METABOLIC PANEL
Anion gap: 12 (ref 5–15)
BUN: 11 mg/dL (ref 6–20)
CO2: 23 mmol/L (ref 22–32)
Calcium: 9.4 mg/dL (ref 8.9–10.3)
Chloride: 103 mmol/L (ref 98–111)
Creatinine, Ser: 0.89 mg/dL (ref 0.44–1.00)
GFR, Estimated: >60 mL/min (ref >60)
Glucose, Bld: 133 mg/dL — ABNORMAL HIGH (ref 70–99)
Potassium: 4 mmol/L (ref 3.5–5.1)
Sodium: 138 mmol/L (ref 135–145)

## 2021-03-01 LAB — URINE DRUG SCREEN, QUALITATIVE (ARMC ONLY)
Amphetamines, Ur Screen: NOT DETECTED
Barbiturates, Ur Screen: NOT DETECTED
Benzodiazepine, Ur Scrn: NOT DETECTED
Cannabinoid 50 Ng, Ur ~~LOC~~: NOT DETECTED
Cocaine Metabolite,Ur ~~LOC~~: NOT DETECTED
MDMA (Ecstasy)Ur Screen: NOT DETECTED
Methadone Scn, Ur: NOT DETECTED
Opiate, Ur Screen: NOT DETECTED
Phencyclidine (PCP) Ur S: NOT DETECTED
Tricyclic, Ur Screen: POSITIVE — AB

## 2021-03-01 LAB — HEPATIC FUNCTION PANEL
ALT: 57 U/L — ABNORMAL HIGH (ref 0–44)
AST: 68 U/L — ABNORMAL HIGH (ref 15–41)
Albumin: 4.3 g/dL (ref 3.5–5.0)
Alkaline Phosphatase: 87 U/L (ref 38–126)
Bilirubin, Direct: 0.1 mg/dL (ref 0.0–0.2)
Indirect Bilirubin: 0.9 mg/dL (ref 0.3–0.9)
Total Bilirubin: 1 mg/dL (ref 0.3–1.2)
Total Protein: 8.4 g/dL — ABNORMAL HIGH (ref 6.5–8.1)

## 2021-03-01 LAB — URINALYSIS, COMPLETE (UACMP) WITH MICROSCOPIC
Bilirubin Urine: NEGATIVE
Glucose, UA: NEGATIVE mg/dL
Hgb urine dipstick: NEGATIVE
Ketones, ur: 5 mg/dL — AB
Leukocytes,Ua: NEGATIVE
Nitrite: NEGATIVE
Protein, ur: NEGATIVE mg/dL
Specific Gravity, Urine: 1.027 (ref 1.005–1.030)
pH: 5 (ref 5.0–8.0)

## 2021-03-01 LAB — CBC
HCT: 42.5 % (ref 36.0–46.0)
Hemoglobin: 14.5 g/dL (ref 12.0–15.0)
MCH: 30.6 pg (ref 26.0–34.0)
MCHC: 34.1 g/dL (ref 30.0–36.0)
MCV: 89.7 fL (ref 80.0–100.0)
Platelets: 330 10*3/uL (ref 150–400)
RBC: 4.74 MIL/uL (ref 3.87–5.11)
RDW: 13 % (ref 11.5–15.5)
WBC: 15.1 10*3/uL — ABNORMAL HIGH (ref 4.0–10.5)
nRBC: 0 % (ref 0.0–0.2)

## 2021-03-01 LAB — LACTIC ACID, PLASMA: Lactic Acid, Venous: 1.5 mmol/L (ref 0.5–1.9)

## 2021-03-01 LAB — POC URINE PREG, ED: Preg Test, Ur: NEGATIVE

## 2021-03-01 LAB — PROTIME-INR
INR: 1.2 (ref 0.8–1.2)
Prothrombin Time: 14.8 seconds (ref 11.4–15.2)

## 2021-03-01 LAB — RESP PANEL BY RT-PCR (FLU A&B, COVID) ARPGX2
Influenza A by PCR: NEGATIVE
Influenza B by PCR: NEGATIVE
SARS Coronavirus 2 by RT PCR: NEGATIVE

## 2021-03-01 LAB — BETA-HYDROXYBUTYRIC ACID: Beta-Hydroxybutyric Acid: 0.15 mmol/L (ref 0.05–0.27)

## 2021-03-01 LAB — RAPID HIV SCREEN (HIV 1/2 AB+AG)
HIV 1/2 Antibodies: NONREACTIVE
HIV-1 P24 Antigen - HIV24: NONREACTIVE

## 2021-03-01 MED ORDER — METOPROLOL TARTRATE 25 MG PO TABS
12.5000 mg | ORAL_TABLET | Freq: Two times a day (BID) | ORAL | Status: DC
  Administered 2021-03-02 (×2): 12.5 mg via ORAL
  Filled 2021-03-01 (×2): qty 1

## 2021-03-01 MED ORDER — AMITRIPTYLINE HCL 75 MG PO TABS
75.0000 mg | ORAL_TABLET | Freq: Every day | ORAL | Status: DC
  Administered 2021-03-02: 75 mg via ORAL
  Filled 2021-03-01 (×2): qty 1

## 2021-03-01 MED ORDER — IOHEXOL 350 MG/ML SOLN
75.0000 mL | Freq: Once | INTRAVENOUS | Status: AC | PRN
  Administered 2021-03-01: 75 mL via INTRAVENOUS

## 2021-03-01 MED ORDER — ONDANSETRON HCL 4 MG/2ML IJ SOLN
4.0000 mg | Freq: Four times a day (QID) | INTRAMUSCULAR | Status: DC | PRN
  Administered 2021-03-02: 4 mg via INTRAVENOUS
  Filled 2021-03-01: qty 2

## 2021-03-01 MED ORDER — HYDROMORPHONE HCL 1 MG/ML IJ SOLN
1.0000 mg | Freq: Once | INTRAMUSCULAR | Status: AC
  Administered 2021-03-01: 1 mg via INTRAVENOUS
  Filled 2021-03-01: qty 1

## 2021-03-01 MED ORDER — MORPHINE SULFATE (PF) 2 MG/ML IV SOLN: 2.0000 mg | INTRAVENOUS | Status: DC | PRN

## 2021-03-01 MED ORDER — ENOXAPARIN SODIUM 40 MG/0.4ML ~~LOC~~ SOLN
40.0000 mg | Freq: Every day | SUBCUTANEOUS | Status: DC
  Administered 2021-03-02: 40 mg via SUBCUTANEOUS
  Filled 2021-03-01: qty 0.4

## 2021-03-01 MED ORDER — OXYCODONE HCL 5 MG PO TABS
5.0000 mg | ORAL_TABLET | ORAL | Status: DC | PRN
  Administered 2021-03-02: 5 mg via ORAL
  Filled 2021-03-01: qty 1

## 2021-03-01 MED ORDER — IBUPROFEN 400 MG PO TABS: 400.0000 mg | ORAL_TABLET | Freq: Four times a day (QID) | ORAL | Status: DC | PRN

## 2021-03-01 MED ORDER — SODIUM CHLORIDE 0.9 % IV SOLN: INTRAVENOUS | Status: DC

## 2021-03-01 MED ORDER — SODIUM CHLORIDE 0.9 % IV BOLUS
1000.0000 mL | Freq: Once | INTRAVENOUS | Status: AC
  Administered 2021-03-01: 1000 mL via INTRAVENOUS

## 2021-03-01 MED ORDER — SENNA 8.6 MG PO TABS: 1.0000 | ORAL_TABLET | Freq: Every evening | ORAL | Status: DC | PRN

## 2021-03-01 MED ORDER — ONDANSETRON HCL 4 MG/2ML IJ SOLN
4.0000 mg | Freq: Once | INTRAMUSCULAR | Status: AC
  Administered 2021-03-01: 4 mg via INTRAVENOUS
  Filled 2021-03-01: qty 2

## 2021-03-01 MED ORDER — BUSPIRONE HCL 10 MG PO TABS
10.0000 mg | ORAL_TABLET | Freq: Two times a day (BID) | ORAL | Status: DC
  Administered 2021-03-02 (×2): 10 mg via ORAL
  Filled 2021-03-01 (×3): qty 1

## 2021-03-01 MED ORDER — MORPHINE SULFATE (PF) 4 MG/ML IV SOLN
4.0000 mg | Freq: Once | INTRAVENOUS | Status: AC
  Administered 2021-03-01: 4 mg via INTRAVENOUS
  Filled 2021-03-01: qty 1

## 2021-03-01 MED ORDER — KETOROLAC TROMETHAMINE 30 MG/ML IJ SOLN
15.0000 mg | Freq: Once | INTRAMUSCULAR | Status: AC
  Administered 2021-03-01: 15 mg via INTRAVENOUS
  Filled 2021-03-01: qty 1

## 2021-03-01 NOTE — ED Notes (Signed)
Pt has right flank pain.  Hx kidney stones.  States pain feels like a kidney stone again.  Pt has nausea and diarrhea.  Sx began today.  Iv started and labs sent.

## 2021-03-01 NOTE — ED Notes (Signed)
meds given again.   

## 2021-03-01 NOTE — ED Notes (Signed)
Pt states pain is better.  Family with pt.  Iv fluids infusing  Pt talking on cell phone.

## 2021-03-01 NOTE — ED Notes (Signed)
Oxygen sats dropped to 88 on room air  Placed on 2 liters oxygen [place].  Pa-c fisher aware.   Sinus tach on monitor.  Iv fluids infusing  Pt tearful

## 2021-03-01 NOTE — ED Provider Notes (Signed)
[facility] Emergency Department Provider Note  ____________________________________________   Event Date/Time   First MD Initiated Contact with Patient 03/01/21 1543     (approximate)  I have reviewed the triage vital signs and the nursing notes.   HISTORY  Chief Complaint Flank Pain    HPI [name] is a 45 y.o. female presents emergency department complaining of right flank pain and right lower quadrant pain.  Patient states she has had many kidney stones this feels similar.  Some nausea and one episode of diarrhea earlier today.  States the right lower quadrant pain started earlier today.  Patient last had food/drink about 1 hour ago.    Past Medical History:  Diagnosis Date  . Anemia   . Anxiety   . Chronic back pain   . Ectopic pregnancy   . Headache    MIGRAINES  . History of kidney stones   . Kidney stone    stent and lithotrisy 2009  . Lumbar radiculopathy   . Mental disorder   . PONV (postoperative nausea and vomiting)    DURING KIDNEY STONE REMOVAL  . Seizures (HCC) 2014   one occurence, no triggering event  . UTI (urinary tract infection)     Patient Active Problem List   Diagnosis Date Noted  . Oligohydramnios antepartum 06/06/2015  . Threatened preterm labor 05/20/2015  . Decreased fetal movement   . Preterm contractions   . [redacted] weeks gestation of pregnancy   . Anemia in pregnancy 05/04/2015  . Cervical laceration 11/01/2011  . SVD (spontaneous vaginal delivery) 10/31/2011  . HSV (herpes simplex virus) infection 09/02/2011  . AMA (advanced maternal age) multigravida 35+ 09/02/2011  . Normal pregnancy 09/02/2011  . CERVICAL LYMPHADENOPATHY 09/06/2009  . OPIOID WITHDRAWAL 05/11/2009  . LOW BACK PAIN, ACUTE 09/02/2008  . Anxiety state, unspecified 08/03/2008  . FATIGUE 08/03/2008  . PALPITATIONS, RECURRENT 08/03/2008  . ABNORMAL THYROID FUNCTION TESTS 08/03/2008  . NEPHROLITHIASIS, HX OF 08/03/2008  . 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 CARPAL TUNNEL RELEASE Left 10/22/2019   Procedure: CARPAL TUNNEL RELEASE;  Surgeon: Kennedy Bucker, MD;  Location: [facility] ORS;  Service: Orthopedics;  Laterality: Left;  . CYSTOSCOPY/RETROGRADE/URETEROSCOPY    . DILATION AND CURETTAGE OF UTERUS  11/01/2011   Procedure: DILATATION AND CURETTAGE;  Surgeon: Zenaida Niece, MD;  Location: [facility] OR[facility];  Service: Gynecology;  Laterality: N/A;  . HARDWARE REMOVAL Left 12/31/2019   Procedure: LEFT WRIST HARDWARE REMOVAL;  Surgeon: Kennedy Bucker, MD;  Location: [facility] ORS;  Service: Orthopedics;  Laterality: Left;  . LAPAROSCOPIC TUBAL LIGATION Bilateral 07/15/2015   Procedure: LAPAROSCOPIC TUBAL LIGATION;  Surgeon: Nadara Mustard, MD;  Location: [facility] ORS;  Service: Gynecology;  Laterality: Bilateral;  . LAPAROSCOPY  11/01/2011   Procedure: LAPAROSCOPY OPERATIVE;  Surgeon: Zenaida Niece, MD;  Location: [facility] OR[facility];  Service: Gynecology;  Laterality: N/A;  . LAPAROSCOPY FOR ECTOPIC PREGNANCY  2008   mtx also  . LITHOTRIPSY    . OPEN REDUCTION INTERNAL FIXATION (ORIF) DISTAL RADIAL FRACTURE Left 10/22/2019   Procedure: OPEN REDUCTION INTERNAL FIXATION (ORIF) DISTAL RADIAL FRACTURE;  Surgeon: Kennedy Bucker, MD;  Location: [facility] ORS;  Service: Orthopedics;  Laterality: Left;  . TUBAL LIGATION      Prior to Admission medications   Medication Sig Start Date End Date Taking? Authorizing Provider  acetaminophen (TYLENOL) 500 MG tablet Take 1,000 mg by mouth every 8 (eight) hours as needed for  moderate pain or headache.     [provider]  HYDROcodone-acetaminophen (NORCO) 5-325 MG tablet Take 1 tablet by mouth every 4 (four) hours as needed for moderate pain. 12/31/19   Kennedy Bucker, MD    Allergies Sulfa antibiotics, Tramadol, and Prochlorperazine edisylate  Family History  Problem Relation Age of Onset  . Cancer Maternal Grandmother     Social History Social History    Tobacco Use  . Smoking status: Former Smoker    Types: Cigarettes    Quit date: 10/21/2019    Years since quitting: 1.3  . Smokeless tobacco: Never Used  Vaping Use  . Vaping Use: Never used  Substance Use Topics  . Alcohol use: No  . Drug use: No    Review of Systems  Constitutional: No fever/chills Eyes: No visual changes. ENT: No sore throat. Respiratory: Denies cough Cardiovascular: Denies chest pain Gastrointestinal: Positive abdominal pain Genitourinary: Negative for dysuria. Musculoskeletal: Negative for back pain. Skin: Negative for rash. Psychiatric: no mood changes,     ____________________________________________   PHYSICAL EXAM:  VITAL SIGNS: ED Triage Vitals  Enc Vitals Group     BP 03/01/21 1546 (!) 188/112     Pulse Rate 03/01/21 1546 (!) 137     Resp 03/01/21 1546 18     Temp 03/01/21 1546 97.8 F (36.6 C)     Temp Source 03/01/21 1546 Oral     SpO2 03/01/21 1546 98 %     Weight 03/01/21 1544 165 lb (74.8 kg)     Height 03/01/21 1544 5\' 3"  (1.6 m)     Head Circumference --      Peak Flow --      Pain Score 03/01/21 1544 7     Pain Loc --      Pain Edu? --      Excl. in GC? --     Constitutional: Alert and oriented. Well appearing and in no acute distress. Eyes: Conjunctivae are normal.  Head: Atraumatic. Nose: No congestion/rhinnorhea. Mouth/Throat: Mucous membranes are moist.   Neck:  supple no lymphadenopathy noted Cardiovascular: Normal rate, regular rhythm. Heart sounds are normal Respiratory: Normal respiratory effort.  No retractions, lungs c t a  Abd: soft tender in the right lower quadrant, bs normal all 4 quad GU: deferred Musculoskeletal: FROM all extremities, warm and well perfused Neurologic:  Normal speech and language.  Skin:  Skin is warm, dry and intact. No rash noted. Psychiatric: Mood and affect are normal. Speech and behavior are normal.  ____________________________________________   LABS (all labs ordered  are listed, but only abnormal results are displayed)  Labs Reviewed  URINALYSIS, COMPLETE (UACMP) WITH MICROSCOPIC - Abnormal; Notable for the following components:      Result Value   Color, Urine YELLOW (*)    APPearance TURBID (*)    Ketones, ur 5 (*)    Bacteria, UA RARE (*)    All other components within normal limits  BASIC METABOLIC PANEL - Abnormal; Notable for the following components:   Glucose, Bld 133 (*)    All other components within normal limits  CBC - Abnormal; Notable for the following components:   WBC 15.1 (*)    All other components within normal limits  HEPATIC FUNCTION PANEL - Abnormal; Notable for the following components:   Total Protein 8.4 (*)    AST 68 (*)    ALT 57 (*)    All other components within normal limits  PROTIME-INR  URINE DRUG SCREEN, QUALITATIVE [facility]  ONLY)  POC URINE PREG, ED   ____________________________________________   ____________________________________________  RADIOLOGY  CT renal stone  ____________________________________________   PROCEDURES  Procedure(s) performed: No  Procedures    ____________________________________________   INITIAL IMPRESSION / ASSESSMENT AND PLAN / ED COURSE  Pertinent labs & imaging results that were available during my care of the patient were reviewed by me and considered in my medical decision making (see chart for details).   Patient is a 45 year old female presents with right lower quadrant pain.  Patient has history of kidney stones.  Patient also still has her appendix.  See HPI, physical exam shows patient to be tachycardic however stable  DDx: Acute appendicitis, kidney stone, ovarian cyst  POC pregnancy, UA, CBC, metabolic panel ordered CT renal stone study  Due to possibility appendicitis patient was instructed to remain n.p.o., she was given normal saline 1 L IV along with morphine and Zofran   CBC is elevated WBC of 15.1, basic metabolic panel, urinalysis, and  hepatic panel are basically normal, hepatic panel just shows a small bump in AST and ALT.  POC pregnancy is negative  Patient remains tachycardic and now her oxygen is dropping into the 80s without O2.  We placed patient on 2 L via nasal cannula.  Ordered CTA for PE  Patient's oxygen increased with a 2 L.  Dr. Katrinka Blazing is aware of patient's condition. We did order a INR to assess liver function and clotting factors  Care being transferred to Dr. Katrinka Blazing at this time  [name] was evaluated in Emergency Department on 03/01/2021 for the symptoms described in the history of present illness. She was evaluated in the context of the global COVID-19 pandemic, which necessitated consideration that the patient might be at risk for infection with the SARS-CoV-2 virus that causes COVID-19. Institutional protocols and algorithms that pertain to the evaluation of patients at risk for COVID-19 are in a state of rapid change based on information released by regulatory bodies including the CDC and federal and state organizations. These policies and algorithms were followed during the patient's care in the ED.    As part of my medical decision making, I reviewed the following data within the electronic MEDICAL RECORD NUMBER Nursing notes reviewed and incorporated, Labs reviewed , EKG interpreted tachycardia, Old chart reviewed, Radiograph reviewed , Evaluated by EM attending , Notes from prior ED visits and [place] Controlled Substance Database  ____________________________________________   FINAL CLINICAL IMPRESSION(S) / ED DIAGNOSES  Final diagnoses:  Abdominal pain  RLQ abdominal pain      NEW MEDICATIONS STARTED DURING THIS VISIT:  New Prescriptions   No medications on file     Note:  This document was prepared using Dragon voice recognition software and may include unintentional dictation errors.    Faythe Ghee, PA-C 03/01/21 2000    Delton [place], MD 03/02/21 [phone]

## 2021-03-01 NOTE — ED Notes (Signed)
RT at bedside, reports they were unable to collect ABG at this time. RT also requests to keep pt off oxygen until she gets to the floor to monitor how pt does without it.

## 2021-03-01 NOTE — H&P (Addendum)
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  Associated with nausea and NO vomiting.  She thought her pain was related to kidney stones.  She was in her usual state of health prior to this.  Also reports starting keto diet to lose weight.  Has been taking pills purchased on the internet mainly made of Beta hydroxybutyrate and caffeine to lose weight.  States her abdomen has been distended for 8-9 months but thought it was related to her obesity.  Not sexually active.  She presented to the ED for further evaluation.  Work up in the ED was unrevealing except for incidentally found hepatomegaly on CT renal stone and sinus tachycardia.  CTA chest negative for PE.  No history of chronic alcohol use, has reported rare use of wine.  Received IV fluid hydration in the ED but no improvement of her tachycardia.  EDP called for admission.  ED Course:  Afebrile.  BP 140/89, pulse 115, respiratory rate 16, O2 saturation 92% on room air.  Review of Systems: Review of systems as noted in the HPI. All other systems reviewed and are negative.   Past Medical History:  Diagnosis Date  . Anemia   . Anxiety   . Chronic back pain   . Ectopic pregnancy   . Headache    MIGRAINES  . History of kidney stones   . Kidney stone    stent and lithotrisy 2009  . Lumbar radiculopathy   . Mental disorder   . PONV (postoperative nausea and vomiting)    DURING KIDNEY STONE REMOVAL  . Seizures (HCC) 2014   one occurence, no triggering event  . UTI (urinary tract infection)    Past Surgical History:  Procedure Laterality Date  .  CARPAL TUNNEL RELEASE Left 10/22/2019   Procedure: CARPAL TUNNEL RELEASE;  Surgeon: Kennedy Bucker, MD;  Location: [facility] ORS;  Service: Orthopedics;  Laterality: Left;  . CYSTOSCOPY/RETROGRADE/URETEROSCOPY    . DILATION AND CURETTAGE OF UTERUS  11/01/2011   Procedure: DILATATION AND CURETTAGE;  Surgeon: Zenaida Niece, MD;  Location: [facility] ORS;  Service: Gynecology;  Laterality: N/A;  . HARDWARE REMOVAL Left 12/31/2019   Procedure: LEFT WRIST HARDWARE REMOVAL;  Surgeon: Kennedy Bucker, MD;  Location: [facility] ORS;  Service: Orthopedics;  Laterality: Left;  . LAPAROSCOPIC TUBAL LIGATION Bilateral 07/15/2015   Procedure: LAPAROSCOPIC TUBAL LIGATION;  Surgeon: Nadara Mustard, MD;  Location: [facility] ORS;  Service: Gynecology;  Laterality: Bilateral;  . LAPAROSCOPY  11/01/2011   Procedure: LAPAROSCOPY OPERATIVE;  Surgeon: Zenaida Niece, MD;  Location: [facility] ORS;  Service: Gynecology;  Laterality: N/A;  . LAPAROSCOPY FOR ECTOPIC PREGNANCY  2008   mtx also  . LITHOTRIPSY    . OPEN REDUCTION INTERNAL FIXATION (ORIF) DISTAL RADIAL FRACTURE Left 10/22/2019   Procedure: OPEN REDUCTION INTERNAL FIXATION (ORIF) DISTAL RADIAL FRACTURE;  Surgeon: Kennedy Bucker, MD;  Location: [facility] ORS;  Service: Orthopedics;  Laterality: Left;  . TUBAL LIGATION      Social History:  reports that she quit smoking about 16 months ago. Her smoking use included cigarettes. She has never used smokeless tobacco. She reports that she does not  drink alcohol and does not use drugs.   Allergies  Allergen Reactions  . Sulfa Antibiotics Hives and Itching  . Tramadol Other (See Comments)    seizures  . Prochlorperazine Edisylate Anxiety    tachycardia    Family History  Problem Relation Age of Onset  . Cancer Maternal Grandmother       Prior to Admission medications   Medication Sig Start Date End Date Taking? Authorizing Provider  acetaminophen (TYLENOL) 500 MG tablet Take 1,000 mg by mouth every 8 (eight) hours as needed for  moderate pain or headache.     [provider]  HYDROcodone-acetaminophen (NORCO) 5-325 MG tablet Take 1 tablet by mouth every 4 (four) hours as needed for moderate pain. 12/31/19   Kennedy Bucker, MD    Physical Exam: BP 140/89   Pulse (!) 115   Temp 97.8 F (36.6 C) (Oral)   Resp 16   Ht 5\' 3"  (1.6 m)   Wt 74.8 kg   SpO2 92%   BMI 29.23 kg/m   . General: 45 y.o. year-old female well developed well nourished in no acute distress.  Alert and oriented x3. . Cardiovascular: Tachycardic with no rubs or gallops.  No thyromegaly or JVD noted.  No lower extremity edema. 2/4 pulses in all 4 extremities. 49 Respiratory: Clear to auscultation with no wheezes or rales. Good inspiratory effort. . Abdomen: Soft nontender distended with normal bowel sounds x4 quadrants. . Muskuloskeletal: No cyanosis, clubbing or edema noted bilaterally . Neuro: CN II-XII intact, strength, sensation, reflexes . Skin: No ulcerative lesions noted or rashes . Psychiatry: Judgement and insight appear normal. Mood is appropriate for condition and setting          Labs on Admission:  Basic Metabolic Panel: Recent Labs  Lab 03/01/21 1551  NA 138  K 4.0  CL 103  CO2 23  GLUCOSE 133*  BUN 11  CREATININE 0.89  CALCIUM 9.4   Liver Function Tests: Recent Labs  Lab 03/01/21 1551  AST 68*  ALT 57*  ALKPHOS 87  BILITOT 1.0  PROT 8.4*  ALBUMIN 4.3   No results for input(s): LIPASE, AMYLASE in the last 168 hours. No results for input(s): AMMONIA in the last 168 hours. CBC: Recent Labs  Lab 03/01/21 1551  WBC 15.1*  HGB 14.5  HCT 42.5  MCV 89.7  PLT 330   Cardiac Enzymes: No results for input(s): CKTOTAL, CKMB, CKMBINDEX, TROPONINI in the last 168 hours.  BNP (last 3 results) No results for input(s): BNP in the last 8760 hours.  ProBNP (last 3 results) No results for input(s): PROBNP in the last 8760 hours.  CBG: No results for input(s): GLUCAP in the last 168 hours.  Radiological  Exams on Admission: CT Angio Chest PE W and/or Wo Contrast  Result Date: 03/01/2021 CLINICAL DATA:  Hypoxia. EXAM: CT ANGIOGRAPHY CHEST WITH CONTRAST TECHNIQUE: Multidetector CT imaging of the chest was performed using the standard protocol during bolus administration of intravenous contrast. Multiplanar CT image reconstructions and MIPs were obtained to evaluate the vascular anatomy. CONTRAST:  9mL OMNIPAQUE IOHEXOL 350 MG/ML SOLN COMPARISON:  None. FINDINGS: Cardiovascular: The heart is normal in size. No pericardial effusion. The aorta is normal in caliber. Minimal atherosclerotic calcifications at the aortic arch. There are significant age advanced coronary artery calcifications noted. The pulmonary arterial tree is fairly well opacified. No filling defects to suggest pulmonary embolism. Mediastinum/Nodes: No mediastinal or hilar mass or adenopathy. The esophagus is grossly normal. Lungs/Pleura: Minimal areas  of subpleural atelectasis but no infiltrates, edema or effusions. No worrisome pulmonary lesions or pulmonary nodules. Upper Abdomen: Diffuse and severe fatty infiltration of the liver. No upper abdominal adenopathy. Musculoskeletal: No breast masses, supraclavicular or axillary adenopathy. The thyroid gland is unremarkable. The bony thorax is intact. Review of the MIP images confirms the above findings. IMPRESSION: 1. No CT findings for pulmonary embolism. 2. Normal caliber thoracic aorta.  No dissection. 3. Age advanced coronary artery calcifications. 4. No acute pulmonary findings. 5. Diffuse and severe fatty infiltration of the liver. 6. Aortic atherosclerosis. Aortic Atherosclerosis (ICD10-I70.0). Electronically Signed   By: Rudie Meyer M.D.   On: 03/01/2021 20:35   CT Renal Stone Study  Result Date: 03/01/2021 CLINICAL DATA:  Flank pain, kidney stone suspected in a 45 year old female. EXAM: CT ABDOMEN AND PELVIS WITHOUT CONTRAST TECHNIQUE: Multidetector CT imaging of the abdomen and pelvis  was performed following the standard protocol without IV contrast. COMPARISON:  December 01, 2014 FINDINGS: Lower chest: Incidental imaging of the lung bases without effusion or sign of consolidative changes. 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 Heart is incompletely imaged without visible effusion. Hepatobiliary: Severe hepatic steatosis. Liver density much less than liver vascular density. No pericholecystic stranding. This has developed since previous imaging. Signs of fatty sparing about the gallbladder fossa. Hepatomegaly liver measuring slightly greater than 20 cm greatest craniocaudal dimension. Pancreas: Normal contour without signs of inflammation or gross ductal distension. Spleen: Normal spleen. Adrenals/Urinary Tract: Adrenal glands are normal. Smooth renal contours. No hydronephrosis. Urinary bladder collapsed. No nephrolithiasis. No definite ureteral calculus. Tiny calcification in the RIGHT hemipelvis appears to be immediately adjacent to but not within the RIGHT ureter. Lack of ureteral dilation with support this and lack of Peri ureteral stranding also with support this possibility. Stomach/Bowel: No acute gastric or small bowel process is identified. 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 The appendix is normal. Colon is normal caliber with stool throughout much of the lumen. No signs of diverticulitis. Scattered diverticulosis. Vascular/Lymphatic: Normal caliber abdominal aorta. Smooth contour of the IVC. There is no gastrohepatic or hepatoduodenal ligament lymphadenopathy. No retroperitoneal or mesenteric lymphadenopathy. No pelvic sidewall lymphadenopathy. Reproductive: Tubal ligation clips in the RIGHT hemipelvis. Otherwise unremarkable on CT. Other: No ascites. Musculoskeletal: No acute musculoskeletal process or destructive bone finding. IMPRESSION: 1. No  evidence of nephrolithiasis or hydronephrosis. Tiny calcification in the RIGHT hemipelvis adjacent to the ureter without secondary signs to suggest ureteral calculus, favored to be outside of the ureter rather than a ureteral calculus. Correlate with urinalysis. 2. Severe hepatic steatosis with hepatomegaly. This represents a significant change since prior imaging. 3. 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 4. 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 5. Aortic atherosclerosis. Electronically Signed   By: Donzetta Kohut M.D.   On: 03/01/2021 16:34   US PELVIC COMPLETE WITH TRANSVAGINAL  Result Date: 03/01/2021 CLINICAL DATA:  Initial evaluation for acute right lower quadrant pain. History of prior tubal ligation. EXAM: TRANSABDOMINAL AND TRANSVAGINAL ULTRASOUND OF PELVIS TECHNIQUE: Both transabdominal and transvaginal ultrasound examinations of the pelvis were performed. Transabdominal technique was performed for global imaging of the pelvis including uterus, ovaries, adnexal regions, and pelvic cul-de-sac. It was necessary to proceed with endovaginal exam following the transabdominal exam to visualize the uterus, endometrium, and ovaries. COMPARISON:  Prior CT from 03/01/2021. FINDINGS: Uterus Measurements:  8.8 x 4.0 x 5.1 cm = volume: 95.0 mL. Uterus is anteverted. No discrete fibroid or other mass. Endometrium Thickness: 12.0 mm.  No focal abnormality visualized. Right ovary Measurements: 2.9 x 1.5 x 1.9 cm = volume: 4.4 mL. Normal appearance/no adnexal mass. Small dominant follicle noted. Left ovary Measurements: 1.8 x 1.1 x 1.3 cm = volume: 1.5 mL. Normal appearance/no adnexal mass. Other findings No abnormal free fluid. IMPRESSION: Normal pelvic ultrasound. No findings to explain patient's symptoms identified. Electronically Signed   By: Rise Mu M.D.   On: 03/01/2021 20:15   US Abdomen Limited  RUQ (LIVER/GB)  Result Date: 03/01/2021 CLINICAL DATA:  45 year old female with abdominal pain. EXAM: ULTRASOUND ABDOMEN LIMITED RIGHT UPPER QUADRANT COMPARISON:  CT abdomen pelvis dated 03/01/2021. FINDINGS: Gallbladder: No gallstones or wall thickening visualized. No sonographic Murphy sign noted by sonographer. Common bile duct: Diameter: 3 mm Liver: There is diffuse increased liver echogenicity most commonly seen in the setting of fatty infiltration. Superimposed inflammation or fibrosis is not excluded. The liver is enlarged. Correlation with clinical exam and LFTs recommended to evaluate for possibility of steatohepatitis. Portal vein is patent on color Doppler imaging with normal direction of blood flow towards the liver. Other: None. IMPRESSION: Severe fatty liver, otherwise unremarkable right upper quadrant ultrasound. Electronically Signed   By: Elgie Collard M.D.   On: 03/01/2021 17:57    EKG: I independently viewed the EKG done and my findings are as followed: Sinus tachycardia rate of 126 nonspecific ST-T changes.  QTc 454.  Assessment/Plan Present on Admission: . Abdominal pain  Active Problems:   Abdominal pain  Right lower quadrant abdominal pain, unclear etiology. Presented with sudden onset right lower quadrant abdominal pain CT renal stone negative for nephrolithiasis.  However showed severe hepatic steatosis with hepatomegaly. Supportive care Pain control  Severe hepatic steatosis seen on CT renal stone study. Hepatomegaly liver measuring slightly greater than 20 cm greatest craniocaudal dimension, not seen on CT done in 2016. Denies chronic use of alcohol. Admits to recently starting keto diet, taking pills purchased over the Internet to lose weight, mainly containing caffeine and beta hydroxybutyrate, obtain level. AST and ALT are elevated.  INR is normal.  T bilirubin is normal. Abdomen is distended on exam. GI consult  Sinus tachycardia, possibly from diet  pills Obtain TSH Get limited 2D echo Start p.o. Lopressor 12.5 mg twice daily Continue IV fluid hydration Closely monitor on telemetry  Elevated liver chemistries, unclear etiology Obtain acute hepatitis viral panel. AST and ALT are elevated, alkaline phosphatase and T bilirubin are normal. Closely monitor LFTs while on home amitriptyline and buspirone Repeat CMP in the morning Avoid hepatotoxic agents as able.  SIRS, no source of active infective process. Presented with leukocytosis WBC 15,000, tachycardia and tachypnea. UA equivocal No evidence of pneumonia on CT chest Obtain lactic acid level Repeat CBC in the morning  Mild hyperglycemia Serum glucose 133 A1c, monitor for now  Chronic anxiety Resume home amitriptyline and home buspirone Closely monitor LFTs while on these medications.   DVT prophylaxis: Subcu Lovenox daily.  Code Status: Full code.  Family Communication: None at bedside.  Disposition Plan: Admit to MedSurg unit with remote telemetry.  Consults called: GI.  Admission status: Observation status.   Status is: Observation    Dispo: The patient is from: Home.              Anticipated d/c is to: Home.  Patient currently not stable for discharge due to ongoing management of symptomatology   Difficult to place patient, not applicable.       Darlin Droparole N Brynlyn Dade MD [facility] Pager [phone]  If 7PM-7AM, please contact night-coverage [email or web address] Password [email or web address]  03/01/2021, 10:52 PM

## 2021-03-01 NOTE — ED Triage Notes (Signed)
Pt comes pov with right sided flank and quad pain since 12pm today. Hx of stones.

## 2021-03-01 NOTE — ED Notes (Signed)
Pain meds given  Pt alert.   

## 2021-03-02 ENCOUNTER — Observation Stay
Admit: 2021-03-02 | Discharge: 2021-03-02 | Disposition: A | Discharge status: Home / Self Care | Payer: Medicaid Other | Attending: Internal Medicine | Admitting: Internal Medicine

## 2021-03-02 DIAGNOSIS — R1031 Right lower quadrant pain: Secondary | ICD-10-CM

## 2021-03-02 DIAGNOSIS — R9431 Abnormal electrocardiogram [ECG] [EKG]: Secondary | ICD-10-CM | POA: Diagnosis not present

## 2021-03-02 LAB — COMPREHENSIVE METABOLIC PANEL
ALT: 41 U/L (ref 0–44)
AST: 46 U/L — ABNORMAL HIGH (ref 15–41)
Albumin: 3.2 g/dL — ABNORMAL LOW (ref 3.5–5.0)
Alkaline Phosphatase: 64 U/L (ref 38–126)
Anion gap: 8 (ref 5–15)
BUN: 10 mg/dL (ref 6–20)
CO2: 21 mmol/L — ABNORMAL LOW (ref 22–32)
Calcium: 7.9 mg/dL — ABNORMAL LOW (ref 8.9–10.3)
Chloride: 108 mmol/L (ref 98–111)
Creatinine, Ser: 0.77 mg/dL (ref 0.44–1.00)
GFR, Estimated: >60 mL/min (ref >60)
Glucose, Bld: 118 mg/dL — ABNORMAL HIGH (ref 70–99)
Potassium: 3.6 mmol/L (ref 3.5–5.1)
Sodium: 137 mmol/L (ref 135–145)
Total Bilirubin: 0.9 mg/dL (ref 0.3–1.2)
Total Protein: 6.4 g/dL — ABNORMAL LOW (ref 6.5–8.1)

## 2021-03-02 LAB — CBC WITH DIFFERENTIAL/PLATELET
Abs Immature Granulocytes: 0.03 10*3/uL (ref 0.00–0.07)
Basophils Absolute: 0.1 10*3/uL (ref 0.0–0.1)
Basophils Relative: 1 %
Eosinophils Absolute: 0.3 10*3/uL (ref 0.0–0.5)
Eosinophils Relative: 3 %
HCT: 36.6 % (ref 36.0–46.0)
Hemoglobin: 12.4 g/dL (ref 12.0–15.0)
Immature Granulocytes: 0 %
Lymphocytes Relative: 39 %
Lymphs Abs: 3.9 10*3/uL (ref 0.7–4.0)
MCH: 30.8 pg (ref 26.0–34.0)
MCHC: 33.9 g/dL (ref 30.0–36.0)
MCV: 91 fL (ref 80.0–100.0)
Monocytes Absolute: 0.7 10*3/uL (ref 0.1–1.0)
Monocytes Relative: 7 %
Neutro Abs: 5 10*3/uL (ref 1.7–7.7)
Neutrophils Relative %: 50 %
Platelets: 258 10*3/uL (ref 150–400)
RBC: 4.02 MIL/uL (ref 3.87–5.11)
RDW: 13.2 % (ref 11.5–15.5)
WBC: 9.9 10*3/uL (ref 4.0–10.5)
nRBC: 0 % (ref 0.0–0.2)

## 2021-03-02 LAB — PHOSPHORUS: Phosphorus: 3.2 mg/dL (ref 2.5–4.6)

## 2021-03-02 LAB — MAGNESIUM: Magnesium: 1.7 mg/dL (ref 1.7–2.4)

## 2021-03-02 LAB — HEPATITIS PANEL, ACUTE
HCV Ab: NONREACTIVE
Hep A IgM: NONREACTIVE
Hep B C IgM: NONREACTIVE
Hepatitis B Surface Ag: NONREACTIVE

## 2021-03-02 LAB — HEMOGLOBIN A1C
Hgb A1c MFr Bld: 5.8 % — ABNORMAL HIGH (ref 4.8–5.6)
Mean Plasma Glucose: 119.76 mg/dL

## 2021-03-02 LAB — ECHOCARDIOGRAM LIMITED
Height: 63 in
S' Lateral: 2.1 cm
Weight: 2640 oz

## 2021-03-02 LAB — T4, FREE: Free T4: 0.91 ng/dL (ref 0.61–1.12)

## 2021-03-02 LAB — TSH: TSH: 6.091 u[IU]/mL — ABNORMAL HIGH (ref 0.350–4.500)

## 2021-03-02 LAB — LACTIC ACID, PLASMA: Lactic Acid, Venous: 1.3 mmol/L (ref 0.5–1.9)

## 2021-03-02 NOTE — Progress Notes (Signed)
*  PRELIMINARY RESULTS* Echocardiogram 2D Echocardiogram has been performed.  Joanette Gula Kirti Carl 03/02/2021, 10:15 AM

## 2021-03-02 NOTE — H&P (Signed)
Consultation  Referring Provider:     Dr Margo Aye Admit date 03/01/21 Consult date        03/02/21 Reason for Consultation:    Hepatic steatosis         HPI:   [name] is a 45 y.o. female with history of renal stones/anxiety admitted yesterday with sudden onset RLQ pain. Feeling some better today. Reports she has had some ongoing generalized abdominal discomfort with bloating over the last 8-87m. Has been  having loose stools for the last month with some brpr at times which she attributes to hemorrhoids- states one stool daily.  States she developed bad rlq pain yesterday while at work- has not had pain like this in past, with severe nausea yesterday- thought it was related to kidney stone or ovarian cyst which she has had in past however these were not present. No known triggering event. She then came to ED for eval and was subsequently admitted. Imaging has not demonstrated any ovarian or definite renal stone issues. There are some diverticula in the proximal jejunum. There was severe hepatic steatosis and some hepatomegaly. Her tranasminases were mildly elevated on admission, platelet count normal. Hepatitis panel pending. Denies any problems with gerd/dysphagia/unplanned weight loss. She has been trying to lose weight- following a keto diet. Note she has been taking an otc keto diet supplement which has green tea extract in it. States she rarely ever has any etoh, there in no family history of liver disease. Works as cna. Denies intransasal cocaine/ivdu/tattoos, incarceration, history of blood transfusion/diaylsis, other herbals, foreign travel. She has never had any jaundice or ascites. States her liver enzymes were mildly elevated last year when checked by PCP and hcv screen negative.   PREVIOUS ENDOSCOPIES:            none   Past Medical History:  Diagnosis Date  . Anemia   . Anxiety   . Chronic back pain   . Ectopic pregnancy   . Headache    MIGRAINES  . History of kidney stones   .  Kidney stone    stent and lithotrisy 2009  . Lumbar radiculopathy   . Mental disorder   . PONV (postoperative nausea and vomiting)    DURING KIDNEY STONE REMOVAL  . Seizures (HCC) 2014   one occurence, no triggering event  . UTI (urinary tract infection)     Past Surgical History:  Procedure Laterality Date  . CARPAL TUNNEL RELEASE Left 10/22/2019   Procedure: CARPAL TUNNEL RELEASE;  Surgeon: Kennedy Bucker, MD;  Location: [facility];  Service: Orthopedics;  Laterality: Left;  . CYSTOSCOPY/RETROGRADE/URETEROSCOPY    . DILATION AND CURETTAGE OF UTERUS  11/01/2011   Procedure: DILATATION AND CURETTAGE;  Surgeon: Zenaida Niece, MD;  Location: [facility];  Service: Gynecology;  Laterality: N/A;  . HARDWARE REMOVAL Left 12/31/2019   Procedure: LEFT WRIST HARDWARE REMOVAL;  Surgeon: Kennedy Bucker, MD;  Location: [facility];  Service: Orthopedics;  Laterality: Left;  . LAPAROSCOPIC TUBAL LIGATION Bilateral 07/15/2015   Procedure: LAPAROSCOPIC TUBAL LIGATION;  Surgeon: Nadara Mustard, MD;  Location: [facility];  Service: Gynecology;  Laterality: Bilateral;  . LAPAROSCOPY  11/01/2011   Procedure: LAPAROSCOPY OPERATIVE;  Surgeon: Zenaida Niece, MD;  Location: [facility];  Service: Gynecology;  Laterality: N/A;  . LAPAROSCOPY FOR ECTOPIC PREGNANCY  2008   mtx also  . LITHOTRIPSY    . OPEN REDUCTION INTERNAL FIXATION (ORIF) DISTAL RADIAL FRACTURE Left 10/22/2019   Procedure: OPEN REDUCTION INTERNAL FIXATION (ORIF)  DISTAL RADIAL FRACTURE;  Surgeon: Kennedy BuckerMenz, Michael, MD;  Location: [facility];  Service: Orthopedics;  Laterality: Left;  . TUBAL LIGATION      Family History  Problem Relation Age of Onset  . Cancer Maternal Grandmother     Social History   Tobacco Use  . Smoking status: Former Smoker    Types: Cigarettes    Quit date: 10/21/2019    Years since quitting: 1.3  . Smokeless tobacco: Never Used  Vaping Use  . Vaping Use: Never used  Substance Use Topics  . Alcohol use: No  . Drug use: No     Prior to Admission medications   Medication Sig Start Date End Date Taking? Authorizing Provider  amitriptyline (ELAVIL) 75 MG tablet Take 75 mg by mouth at bedtime. 01/11/21  Yes [provider]  busPIRone (BUSPAR) 10 MG tablet Take 10 mg by mouth 2 (two) times daily. 01/11/21  Yes [provider]  cetirizine (ZYRTEC) 10 MG tablet Take 10 mg by mouth daily. 01/31/21  Yes [provider]  ibuprofen (ADVIL) 200 MG tablet Take 400 mg by mouth every 6 (six) hours as needed.   Yes [provider]  acetaminophen (TYLENOL) 500 MG tablet Take 1,000 mg by mouth every 8 (eight) hours as needed for moderate pain or headache.  Patient not taking: Reported on 03/01/2021    [provider]  HYDROcodone-acetaminophen (NORCO) 5-325 MG tablet Take 1 tablet by mouth every 4 (four) hours as needed for moderate pain. Patient not taking: No sig reported 12/31/19   Kennedy BuckerMenz, Michael, MD    Current Facility-Administered Medications  Medication Dose Route Frequency Provider Last Rate Last Admin  . 0.9 %  sodium chloride infusion   Intravenous Continuous Darlin DropHall, Carole N, DO 50 mL/hr at 03/02/21 0325 Infusion Verify at 03/02/21 0325  . amitriptyline (ELAVIL) tablet 75 mg  75 mg Oral QHS Dow AdolphHall, Carole N, DO   75 mg at 03/02/21 0020  . busPIRone (BUSPAR) tablet 10 mg  10 mg Oral BID Dow AdolphHall, Carole N, DO   10 mg at 03/02/21 0020  . enoxaparin (LOVENOX) injection 40 mg  40 mg Subcutaneous Q2200 Dow AdolphHall, Carole N, DO   40 mg at 03/02/21 0020  . ibuprofen (ADVIL) tablet 400 mg  400 mg Oral Q6H PRN Dow AdolphHall, Carole N, DO      . metoprolol tartrate (LOPRESSOR) tablet 12.5 mg  12.5 mg Oral BID Dow AdolphHall, Carole N, DO   12.5 mg at 03/02/21 0019  . morphine 2 MG/ML injection 2 mg  2 mg Intravenous Q4H PRN Dow AdolphHall, Carole N, DO      . ondansetron (ZOFRAN) injection 4 mg  4 mg Intravenous Q6H PRN Dow AdolphHall, Carole N, DO   4 mg at 03/02/21 0019  . oxyCODONE (Oxy IR/ROXICODONE) immediate release tablet 5 mg  5 mg Oral  Q4H PRN Dow AdolphHall, Carole N, DO   5 mg at 03/02/21 0019  . senna (SENOKOT) tablet 8.6 mg  1 tablet Oral QHS PRN Dow AdolphHall, Carole N, DO        Allergies as of 03/01/2021 - Review Complete 03/01/2021  Allergen Reaction Noted  . Sulfa antibiotics Hives and Itching 06/16/2011  . Tramadol Other (See Comments) 12/01/2014  . Prochlorperazine edisylate Anxiety      Review of Systems:    All systems reviewed and negative except where noted in HPI.      Physical Exam:  Vital signs in last 24 hours: Temp:  [97.7 F (36.5 C)-98.3 F (36.8 C)]  97.8 F (36.6 C) (04/28 0748) Pulse Rate:  [65-138] 95 (04/28 0748) Resp:  [16-20] 18 (04/28 0748) BP: (102-193)/(75-112) 122/92 (04/28 0748) SpO2:  [81 %-100 %] 92 % (04/28 0748) Weight:  [74.8 kg] 74.8 kg (04/27 1544) Last BM Date: 03/01/21 General:   Pleasant woman in NAD Head:  Normocephalic and atraumatic. Eyes:   No icterus.   Conjunctiva pink. Ears:  Normal auditory acuity. Mouth: Mucosa pink moist, no lesions. Neck:  Supple; no masses felt Lungs:  Respirations even and unlabored. Lungs clear to auscultation bilaterally.   No wheezes, crackles, or rhonchi.  Heart:  S1S2, RRR, no MRG. No edema. Abdomen:   Flat, soft, nondistended, nontender. Normal bowel sounds. No appreciable masses or hepatomegaly. No rebound signs or other peritoneal signs. Rectal:  Not performed.  Msk:  MAEW x4, No clubbing or cyanosis. Strength 5/5. Symmetrical without gross deformities. Neurologic:  Alert and  oriented x4;  Cranial nerves II-XII intact.  Skin:  Warm, dry, pink without significant lesions or rashes. Psych:  Alert and cooperative. Normal affect.  LAB RESULTS: Recent Labs    03/01/21 1551 03/02/21 0111  WBC 15.1* 9.9  HGB 14.5 12.4  HCT 42.5 36.6  PLT 330 258   BMET Recent Labs    03/01/21 1551 03/02/21 0111  NA 138 137  K 4.0 3.6  CL 103 108  CO2 23 21*  GLUCOSE 133* 118*  BUN 11 10  CREATININE 0.89 0.77  CALCIUM 9.4 7.9*   LFT Recent  Labs    03/01/21 1551 03/02/21 0111  PROT 8.4* 6.4*  ALBUMIN 4.3 3.2*  AST 68* 46*  ALT 57* 41  ALKPHOS 87 64  BILITOT 1.0 0.9  BILIDIR 0.1  --   IBILI 0.9  --    PT/INR Recent Labs    03/01/21 1916  LABPROT 14.8  INR 1.2    STUDIES: CT Angio Chest PE W and/or Wo Contrast  Result Date: 03/01/2021 CLINICAL DATA:  Hypoxia. EXAM: CT ANGIOGRAPHY CHEST WITH CONTRAST TECHNIQUE: Multidetector CT imaging of the chest was performed using the standard protocol during bolus administration of intravenous contrast. Multiplanar CT image reconstructions and MIPs were obtained to evaluate the vascular anatomy. CONTRAST:  3mL OMNIPAQUE IOHEXOL 350 MG/ML SOLN COMPARISON:  None. FINDINGS: Cardiovascular: The heart is normal in size. No pericardial effusion. The aorta is normal in caliber. Minimal atherosclerotic calcifications at the aortic arch. There are significant age advanced coronary artery calcifications noted. The pulmonary arterial tree is fairly well opacified. No filling defects to suggest pulmonary embolism. Mediastinum/Nodes: No mediastinal or hilar mass or adenopathy. The esophagus is grossly normal. Lungs/Pleura: Minimal areas of subpleural atelectasis but no infiltrates, edema or effusions. No worrisome pulmonary lesions or pulmonary nodules. Upper Abdomen: Diffuse and severe fatty infiltration of the liver. No upper abdominal adenopathy. Musculoskeletal: No breast masses, supraclavicular or axillary adenopathy. The thyroid gland is unremarkable. The bony thorax is intact. Review of the MIP images confirms the above findings. IMPRESSION: 1. No CT findings for pulmonary embolism. 2. Normal caliber thoracic aorta.  No dissection. 3. Age advanced coronary artery calcifications. 4. No acute pulmonary findings. 5. Diffuse and severe fatty infiltration of the liver. 6. Aortic atherosclerosis. Aortic Atherosclerosis (ICD10-I70.0). Electronically Signed   By: Rudie Meyer M.D.   On: 03/01/2021 20:35    CT Renal Stone Study  Result Date: 03/01/2021 CLINICAL DATA:  Flank pain, kidney stone suspected in a 45 year old female. EXAM: CT ABDOMEN AND PELVIS WITHOUT CONTRAST TECHNIQUE: Multidetector CT imaging of the  abdomen and pelvis was performed following the standard protocol without IV contrast. COMPARISON:  December 01, 2014 FINDINGS: Lower chest: Incidental imaging of the lung bases without effusion or sign of consolidative changes. 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 Heart is incompletely imaged without visible effusion. Hepatobiliary: Severe hepatic steatosis. Liver density much less than liver vascular density. No pericholecystic stranding. This has developed since previous imaging. Signs of fatty sparing about the gallbladder fossa. Hepatomegaly liver measuring slightly greater than 20 cm greatest craniocaudal dimension. Pancreas: Normal contour without signs of inflammation or gross ductal distension. Spleen: Normal spleen. Adrenals/Urinary Tract: Adrenal glands are normal. Smooth renal contours. No hydronephrosis. Urinary bladder collapsed. No nephrolithiasis. No definite ureteral calculus. Tiny calcification in the RIGHT hemipelvis appears to be immediately adjacent to but not within the RIGHT ureter. Lack of ureteral dilation with support this and lack of Peri ureteral stranding also with support this possibility. Stomach/Bowel: No acute gastric or small bowel process is identified. 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 The appendix is normal. Colon is normal caliber with stool throughout much of the lumen. No signs of diverticulitis. Scattered diverticulosis. Vascular/Lymphatic: Normal caliber abdominal aorta. Smooth contour of the IVC. There is no gastrohepatic or hepatoduodenal ligament lymphadenopathy. No retroperitoneal or mesenteric lymphadenopathy. No pelvic  sidewall lymphadenopathy. Reproductive: Tubal ligation clips in the RIGHT hemipelvis. Otherwise unremarkable on CT. Other: No ascites. Musculoskeletal: No acute musculoskeletal process or destructive bone finding. IMPRESSION: 1. No evidence of nephrolithiasis or hydronephrosis. Tiny calcification in the RIGHT hemipelvis adjacent to the ureter without secondary signs to suggest ureteral calculus, favored to be outside of the ureter rather than a ureteral calculus. Correlate with urinalysis. 2. Severe hepatic steatosis with hepatomegaly. This represents a significant change since prior imaging. 3. 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 4. 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 5. Aortic atherosclerosis. Electronically Signed   By: Donzetta Kohut M.D.   On: 03/01/2021 16:34   US PELVIC COMPLETE WITH TRANSVAGINAL  Result Date: 03/01/2021 CLINICAL DATA:  Initial evaluation for acute right lower quadrant pain. History of prior tubal ligation. EXAM: TRANSABDOMINAL AND TRANSVAGINAL ULTRASOUND OF PELVIS TECHNIQUE: Both transabdominal and transvaginal ultrasound examinations of the pelvis were performed. Transabdominal technique was performed for global imaging of the pelvis including uterus, ovaries, adnexal regions, and pelvic cul-de-sac. It was necessary to proceed with endovaginal exam following the transabdominal exam to visualize the uterus, endometrium, and ovaries. COMPARISON:  Prior CT from 03/01/2021. FINDINGS: Uterus Measurements: 8.8 x 4.0 x 5.1 cm = volume: 95.0 mL. Uterus is anteverted. No discrete fibroid or other mass. Endometrium Thickness: 12.0 mm.  No focal abnormality visualized. Right ovary Measurements: 2.9 x 1.5 x 1.9 cm = volume: 4.4 mL. Normal appearance/no adnexal mass. Small dominant follicle noted. Left ovary Measurements: 1.8 x 1.1 x 1.3 cm = volume: 1.5 mL. Normal appearance/no adnexal mass.  Other findings No abnormal free fluid. IMPRESSION: Normal pelvic ultrasound. No findings to explain patient's symptoms identified. Electronically Signed   By: Rise Mu M.D.   On: 03/01/2021 20:15   US Abdomen Limited RUQ (LIVER/GB)  Result Date: 03/01/2021 CLINICAL DATA:  45 year old female with abdominal pain. EXAM: ULTRASOUND ABDOMEN LIMITED RIGHT UPPER QUADRANT COMPARISON:  CT abdomen pelvis dated 03/01/2021. FINDINGS: Gallbladder: No gallstones or wall thickening visualized. No sonographic Murphy sign noted by sonographer. Common bile duct:  Diameter: 3 mm Liver: There is diffuse increased liver echogenicity most commonly seen in the setting of fatty infiltration. Superimposed inflammation or fibrosis is not excluded. The liver is enlarged. Correlation with clinical exam and LFTs recommended to evaluate for possibility of steatohepatitis. Portal vein is patent on color Doppler imaging with normal direction of blood flow towards the liver. Other: None. IMPRESSION: Severe fatty liver, otherwise unremarkable right upper quadrant ultrasound. Electronically Signed   By: Elgie Collard M.D.   On: 03/01/2021 17:57       Impression / Plan:   1. Hepatomegaly/elevated transaminases. Discussed her CAM with her. Stop the green tea extract- this has been documented to cause liver harm. Do agree with healthy diet/weight loss/exercise as she may have NAFLD. Hepatis panel pending. Note she is up for discharge today- can do some further evaluation as o/p. She has no evidene of cirrhosis or portal htn at this point.  Transaminases better today. Pt/inr normal.  2. RLQ pain, bloating, irregular bowel habits, intermittent brpr, family history of colon polyps in mother and colon cancer in [place]. May be IBS, ddx includes IBD, other. Feeling better today- am recommending o/p GI eval including colonoscopy-  Have advised her of this. Recommend following up in the next couple of weeks. She is not anemic and wbc has  normalized.    Thank you very much for this consult. These services were provided by Vevelyn Pat, NP-C, in collaboration with Regis Bill, MD, with whom I have discussed this patient in full.   Vevelyn Pat, NP-C

## 2021-03-02 NOTE — Progress Notes (Signed)
   03/01/21 2350  Assess: MEWS Score  Temp 98.3 F (36.8 C)  BP (!) 149/110  Pulse Rate (!) 117  Resp 20  Level of Consciousness Alert  SpO2 95 %  O2 Device Nasal Cannula  O2 Flow Rate (L/min) 2 L/min  Assess: MEWS Score  MEWS Temp 0  MEWS Systolic 0  MEWS Pulse 2  MEWS RR 0  MEWS LOC 0  MEWS Score 2  MEWS Score Color Yellow  Assess: if the MEWS score is Yellow or Red  Were vital signs taken at a resting state? Yes  Focused Assessment No change from prior assessment  Early Detection of Sepsis Score *See Row Information* Low  MEWS guidelines implemented *See Row Information* No, previously yellow, continue vital signs every 4 hours  Treat  MEWS Interventions Administered scheduled meds/treatments (IVF and metoprolol ordered)  Notify: Charge Nurse/RN  Name of Charge Nurse/RN Notified Marcella RN  Date Charge Nurse/RN Notified 03/02/21  Time Charge Nurse/RN Notified [id number]  Document  Patient Outcome Other (Comment) (will continue to monitor the pt)   Pt tachycardic when she was at the ER, IVF started and scheduled metoprolol given. Pt also complaining of abdominal pain p/s-6/10; oxycodone given for pain relief. Will cont to monitor the pt.

## 2021-03-02 NOTE — Plan of Care (Signed)

## 2021-03-02 NOTE — Progress Notes (Signed)
   03/02/21 0700  Clinical Encounter Type  Visited With Patient  Visit Type Initial  Referral From Chaplain  Consult/Referral To Chaplain  Spiritual Encounters  Spiritual Needs Prayer  Chaplain Ulis Kaps responded to an OR for [id number] for prayer and after I visited Mrs. Enis Slipper I asked Mrs. Jla Hetzer who was in bed [id number] could did she mind if I prayed with her and she said sure. I told her since I'm over here making all this noise early this morning let me stop and check on you too, and she chuckled and thanked me for the prayer and visitation. Mrs. Radcliffe even joined in while I was praying with Mrs. Artemisia.

## 2021-03-02 NOTE — Discharge Summary (Addendum)
[name] VFI:[id number] DOB: Nov 07, 1975 DOA: 03/01/2021  PCP: Patient, No Pcp Per (Inactive)  Admit date: 03/01/2021 Discharge date: 03/02/2021  Time spent: 45 minutes  Recommendations for Outpatient Follow-up:  1. Close PCP f/u to assess for recurrence of symptoms, monitor heart rate, follow-up labs, and address patient's new diagnosis of hepatic steatosis, also consider discontinuation of amitryptyline which can cause tachy and other arrhythmias and weight gain. Also to further evaluate complaint of occasional bright red blood per rectum.    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  Associated with nausea and NO vomiting.  She thought her pain was related to kidney stones.  She was in her usual state of health prior to this.  Also reports starting keto diet to lose weight.  Has been taking pills purchased on the internet mainly made of Beta hydroxybutyrate and caffeine to lose weight.  States her abdomen has been distended for 8-9 months but thought it was related to her obesity.  Not sexually active.  She presented to the ED for further evaluation.  Work up in the ED was unrevealing except for incidentally found hepatomegaly on CT renal stone and sinus tachycardia.  CTA chest negative for PE.  No history of chronic alcohol use, has reported rare use of wine.  Received IV fluid hydration in the ED but no improvement of her tachycardia.  EDP called for admission.  Hospital Course:  Patient presented with acute right lower quadrant abdominal pain. Not sexually active. Sinus tachycardic as well. Extensive w/u including CTA chest, CT stone protocol, RUQ u/s,  and pelvic u/s, and bloodwork not revealing etiology of pain. Morning of hospital day 1 pain resolved. Labs show mild transaminitis and mildly elevated TSH. Free T4 and hepatitis panel pending at time of discharge. Sinus tachycardia resolved (though did receive one dose of metoprolol) Discharged with plan for close PCP f/u, possible GI referral for hepatic steatosis. I did advise stopping her diet supplements pending PCP f/u. Patient also notes intermittent small volume BRBPR. She endorses hx of hemorrhoids. Nothing seen on CT and h/h wnl, but merits PCP f/u as well.  Procedures:  none   Consultations:  none  Discharge Exam: Vitals:   03/02/21 0126 03/02/21 0458  BP: (!) 132/101 102/75  Pulse: 99 86  Resp: 18 16  Temp:  97.7 F (36.5 C)  SpO2: 94% 96%    General: NAD Cardiovascular: RRR, no rub or murmur Respiratory: CTAB Abdomen: soft, non-tender, normoactive bowel sounds  Discharge Instructions   Discharge Instructions    Call MD for:  difficulty breathing, headache or visual disturbances   Complete by: As directed    Call MD for:  persistant dizziness or light-headedness   Complete by: As directed    Call MD for:  persistant nausea and vomiting   Complete by: As directed    Call MD for:  severe uncontrolled pain   Complete by: As directed    Call MD for:  temperature >100.4   Complete by: As directed    Diet - low sodium heart healthy   Complete by: As directed    Increase activity slowly  Complete by: As directed      Allergies as of 03/02/2021      Reactions   Sulfa Antibiotics Hives, Itching   Tramadol Other (See Comments)   seizures   Prochlorperazine Edisylate Anxiety   tachycardia      Medication List    TAKE these medications   acetaminophen 500 MG tablet Commonly known as: TYLENOL Take 1,000 mg by mouth every 8 (eight) hours as needed for moderate pain or headache.   amitriptyline 75 MG tablet Commonly known as: ELAVIL Take 75 mg by mouth at  bedtime.   busPIRone 10 MG tablet Commonly known as: BUSPAR Take 10 mg by mouth 2 (two) times daily.   cetirizine 10 MG tablet Commonly known as: ZYRTEC Take 10 mg by mouth daily.   HYDROcodone-acetaminophen 5-325 MG tablet Commonly known as: Norco Take 1 tablet by mouth every 4 (four) hours as needed for moderate pain.   ibuprofen 200 MG tablet Commonly known as: ADVIL Take 400 mg by mouth every 6 (six) hours as needed.      Allergies  Allergen Reactions  . Sulfa Antibiotics Hives and Itching  . Tramadol Other (See Comments)    seizures  . Prochlorperazine Edisylate Anxiety    tachycardia      The results of significant diagnostics from this hospitalization (including imaging, microbiology, ancillary and laboratory) are listed below for reference.    Significant Diagnostic Studies: CT Angio Chest PE W and/or Wo Contrast  Result Date: 03/01/2021 CLINICAL DATA:  Hypoxia. EXAM: CT ANGIOGRAPHY CHEST WITH CONTRAST TECHNIQUE: Multidetector CT imaging of the chest was performed using the standard protocol during bolus administration of intravenous contrast. Multiplanar CT image reconstructions and MIPs were obtained to evaluate the vascular anatomy. CONTRAST:  46mL OMNIPAQUE IOHEXOL 350 MG/ML SOLN COMPARISON:  None. FINDINGS: Cardiovascular: The heart is normal in size. No pericardial effusion. The aorta is normal in caliber. Minimal atherosclerotic calcifications at the aortic arch. There are significant age advanced coronary artery calcifications noted. The pulmonary arterial tree is fairly well opacified. No filling defects to suggest pulmonary embolism. Mediastinum/Nodes: No mediastinal or hilar mass or adenopathy. The esophagus is grossly normal. Lungs/Pleura: Minimal areas of subpleural atelectasis but no infiltrates, edema or effusions. No worrisome pulmonary lesions or pulmonary nodules. Upper Abdomen: Diffuse and severe fatty infiltration of the liver. No upper abdominal  adenopathy. Musculoskeletal: No breast masses, supraclavicular or axillary adenopathy. The thyroid gland is unremarkable. The bony thorax is intact. Review of the MIP images confirms the above findings. IMPRESSION: 1. No CT findings for pulmonary embolism. 2. Normal caliber thoracic aorta.  No dissection. 3. Age advanced coronary artery calcifications. 4. No acute pulmonary findings. 5. Diffuse and severe fatty infiltration of the liver. 6. Aortic atherosclerosis. Aortic Atherosclerosis (ICD10-I70.0). Electronically Signed   By: Rudie Meyer M.D.   On: 03/01/2021 20:35   CT Renal Stone Study  Result Date: 03/01/2021 CLINICAL DATA:  Flank pain, kidney stone suspected in a 45 year old female. EXAM: CT ABDOMEN AND PELVIS WITHOUT CONTRAST TECHNIQUE: Multidetector CT imaging of the abdomen and pelvis was performed following the standard protocol without IV contrast. COMPARISON:  December 01, 2014 FINDINGS: Lower chest: Incidental imaging of the lung bases without effusion or sign of consolidative changes. 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 Heart is incompletely imaged without visible effusion. Hepatobiliary: Severe hepatic steatosis. Liver density much less than liver vascular density. No pericholecystic stranding. This has developed since previous imaging. Signs of fatty sparing about the gallbladder fossa. Hepatomegaly liver  measuring slightly greater than 20 cm greatest craniocaudal dimension. Pancreas: Normal contour without signs of inflammation or gross ductal distension. Spleen: Normal spleen. Adrenals/Urinary Tract: Adrenal glands are normal. Smooth renal contours. No hydronephrosis. Urinary bladder collapsed. No nephrolithiasis. No definite ureteral calculus. Tiny calcification in the RIGHT hemipelvis appears to be immediately adjacent to but not within the RIGHT ureter. Lack of ureteral dilation with support this and lack of Peri ureteral stranding also with support this  possibility. Stomach/Bowel: No acute gastric or small bowel process is identified. 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 The appendix is normal. Colon is normal caliber with stool throughout much of the lumen. No signs of diverticulitis. Scattered diverticulosis. Vascular/Lymphatic: Normal caliber abdominal aorta. Smooth contour of the IVC. There is no gastrohepatic or hepatoduodenal ligament lymphadenopathy. No retroperitoneal or mesenteric lymphadenopathy. No pelvic sidewall lymphadenopathy. Reproductive: Tubal ligation clips in the RIGHT hemipelvis. Otherwise unremarkable on CT. Other: No ascites. Musculoskeletal: No acute musculoskeletal process or destructive bone finding. IMPRESSION: 1. No evidence of nephrolithiasis or hydronephrosis. Tiny calcification in the RIGHT hemipelvis adjacent to the ureter without secondary signs to suggest ureteral calculus, favored to be outside of the ureter rather than a ureteral calculus. Correlate with urinalysis. 2. Severe hepatic steatosis with hepatomegaly. This represents a significant change since prior imaging. 3. 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 4. 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 5. Aortic atherosclerosis. Electronically Signed   By: Donzetta KohutGeoffrey  Wile M.D.   On: 03/01/2021 16:34   US PELVIC COMPLETE WITH TRANSVAGINAL  Result Date: 03/01/2021 CLINICAL DATA:  Initial evaluation for acute right lower quadrant pain. History of prior tubal ligation. EXAM: TRANSABDOMINAL AND TRANSVAGINAL ULTRASOUND OF PELVIS TECHNIQUE: Both transabdominal and transvaginal ultrasound examinations of the pelvis were performed. Transabdominal technique was performed for global imaging of the pelvis including uterus, ovaries, adnexal regions, and pelvic cul-de-sac. It  was necessary to proceed with endovaginal exam following the transabdominal exam to visualize the uterus, endometrium, and ovaries. COMPARISON:  Prior CT from 03/01/2021. FINDINGS: Uterus Measurements: 8.8 x 4.0 x 5.1 cm = volume: 95.0 mL. Uterus is anteverted. No discrete fibroid or other mass. Endometrium Thickness: 12.0 mm.  No focal abnormality visualized. Right ovary Measurements: 2.9 x 1.5 x 1.9 cm = volume: 4.4 mL. Normal appearance/no adnexal mass. Small dominant follicle noted. Left ovary Measurements: 1.8 x 1.1 x 1.3 cm = volume: 1.5 mL. Normal appearance/no adnexal mass. Other findings No abnormal free fluid. IMPRESSION: Normal pelvic ultrasound. No findings to explain patient's symptoms identified. Electronically Signed   By: Rise MuBenjamin  McClintock M.D.   On: 03/01/2021 20:15   US Abdomen Limited RUQ (LIVER/GB)  Result Date: 03/01/2021 CLINICAL DATA:  45 year old female with abdominal pain. EXAM: ULTRASOUND ABDOMEN LIMITED RIGHT UPPER QUADRANT COMPARISON:  CT abdomen pelvis dated 03/01/2021. FINDINGS: Gallbladder: No gallstones or wall thickening visualized. No sonographic Murphy sign noted by sonographer. Common bile duct: Diameter: 3 mm Liver: There is diffuse increased liver echogenicity most commonly seen in the setting of fatty infiltration. Superimposed inflammation or fibrosis is not excluded. The liver is enlarged. Correlation with clinical exam and LFTs recommended to evaluate for possibility of steatohepatitis. Portal vein is patent on color Doppler imaging with normal direction of blood flow towards the liver. Other: None. IMPRESSION: Severe fatty liver, otherwise unremarkable right upper quadrant ultrasound. Electronically Signed   By: Burtis JunesArash  Radparvar M.D.   On: 03/01/2021 17:57    Microbiology: Recent Results (from the past 240 hour(s))  Resp Panel by RT-PCR (Flu A&B, Covid) Nasopharyngeal Swab     Status: None   Collection Time: 03/01/21  9:05 PM   Specimen: Nasopharyngeal Swab;  Nasopharyngeal(NP) swabs in vial transport medium  Result Value Ref Range Status   SARS Coronavirus 2 by RT PCR NEGATIVE NEGATIVE Final    Comment: (NOTE) SARS-CoV-2 target nucleic acids are NOT DETECTED.  The SARS-CoV-2 RNA is generally detectable in upper respiratory specimens during the acute phase of infection. The lowest concentration of SARS-CoV-2 viral copies this assay can detect is 138 copies/mL. A negative result does not preclude SARS-Cov-2 infection and should not be used as the sole basis for treatment or other patient management decisions. A negative result may occur with  improper specimen collection/handling, submission of specimen other than nasopharyngeal swab, presence of viral mutation(s) within the areas targeted by this assay, and inadequate number of viral copies(<138 copies/mL). A negative result must be combined with clinical observations, patient history, and epidemiological information. The expected result is Negative.  Fact Sheet for Patients:  BloggerCourse.com  Fact Sheet for Healthcare Providers:  SeriousBroker.it  This test is no t yet approved or cleared by the [place] FDA and  has been authorized for detection and/or diagnosis of SARS-CoV-2 by FDA under an Emergency Use Authorization (EUA). This EUA will remain  in effect (meaning this test can be used) for the duration of the COVID-19 declaration under Section 564(b)(1) of the Act, 21 U.S.C.section 360bbb-3(b)(1), unless the authorization is terminated  or revoked sooner.       Influenza A by PCR NEGATIVE NEGATIVE Final   Influenza B by PCR NEGATIVE NEGATIVE Final    Comment: (NOTE) The Xpert Xpress SARS-CoV-2/FLU/RSV plus assay is intended as an aid in the diagnosis of influenza from Nasopharyngeal swab specimens and should not be used as a sole basis for treatment. Nasal washings and aspirates are unacceptable for Xpert Xpress  SARS-CoV-2/FLU/RSV testing.  Fact Sheet for Patients: BloggerCourse.com  Fact Sheet for Healthcare Providers: SeriousBroker.it  This test is not yet approved or cleared by the [place] FDA and has been authorized for detection and/or diagnosis of SARS-CoV-2 by FDA under an Emergency Use Authorization (EUA). This EUA will remain in effect (meaning this test can be used) for the duration of the COVID-19 declaration under Section 564(b)(1) of the Act, 21 U.S.C. section 360bbb-3(b)(1), unless the authorization is terminated or revoked.  Performed at [facility], [address] [place], [place] [address]      Labs: Basic Metabolic Panel: Recent Labs  Lab 03/01/21 1551 03/02/21 0111  NA 138 137  K 4.0 3.6  CL 103 108  CO2 23 21*  GLUCOSE 133* 118*  BUN 11 10  CREATININE 0.89 0.77  CALCIUM 9.4 7.9*  MG  --  1.7  PHOS  --  3.2   Liver Function Tests: Recent Labs  Lab 03/01/21 1551 03/02/21 0111  AST 68* 46*  ALT 57* 41  ALKPHOS 87 64  BILITOT 1.0 0.9  PROT 8.4* 6.4*  ALBUMIN 4.3 3.2*   No results for input(s): LIPASE, AMYLASE in the last 168 hours. No results for input(s): AMMONIA in the last 168 hours. CBC: Recent Labs  Lab 03/01/21 1551 03/02/21 0111  WBC 15.1* 9.9  NEUTROABS  --  5.0  HGB 14.5 12.4  HCT 42.5 36.6  MCV 89.7 91.0  PLT 330 258  Cardiac Enzymes: No results for input(s): CKTOTAL, CKMB, CKMBINDEX, TROPONINI in the last 168 hours. BNP: BNP (last 3 results) No results for input(s): BNP in the last 8760 hours.  ProBNP (last 3 results) No results for input(s): PROBNP in the last 8760 hours.  CBG: No results for input(s): GLUCAP in the last 168 hours.     Signed:  Silvano Bilis MD.  [facility] 03/02/2021, 7:46 AM

## 2022-05-08 IMAGING — CT CT ANGIO CHEST
2 of 6 series · 18 of 46 positions shown · IV contrast (APPLIED)
Comparison: None.

CLINICAL DATA: Hypoxia.

EXAM:
CT ANGIOGRAPHY CHEST WITH CONTRAST
TECHNIQUE: Multidetector CT imaging of the chest was performed using the
standard protocol during bolus administration of intravenous
contrast. Multiplanar CT image reconstructions and MIPs were
obtained to evaluate the vascular anatomy.
CONTRAST:  75mL OMNIPAQUE IOHEXOL 350 MG/ML SOLN

[Series 5: thins · axial · 0.61mm/px · z∈[-329,-94]mm · 15 of 259 slices shown]
[im 12/259  lung]
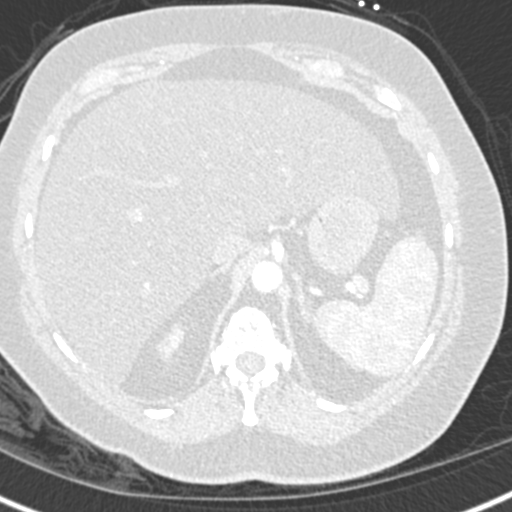
[im 34/259  soft-tissue]
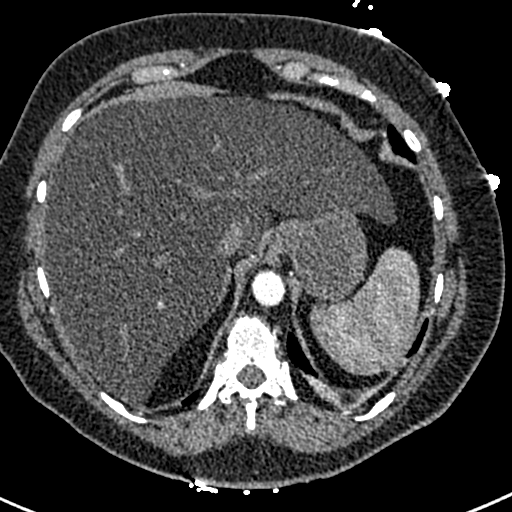
[im 45/259  lung]
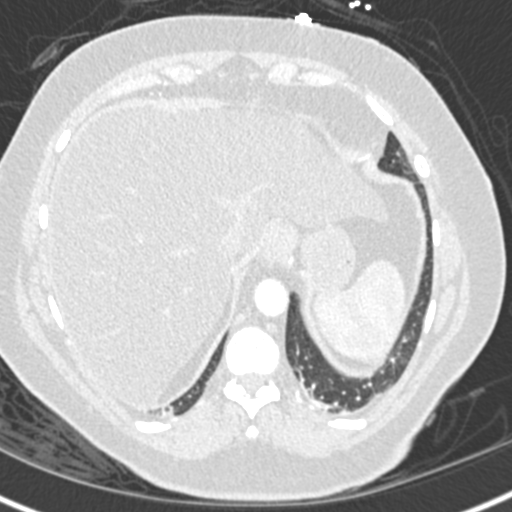
[im 68/259  soft-tissue]
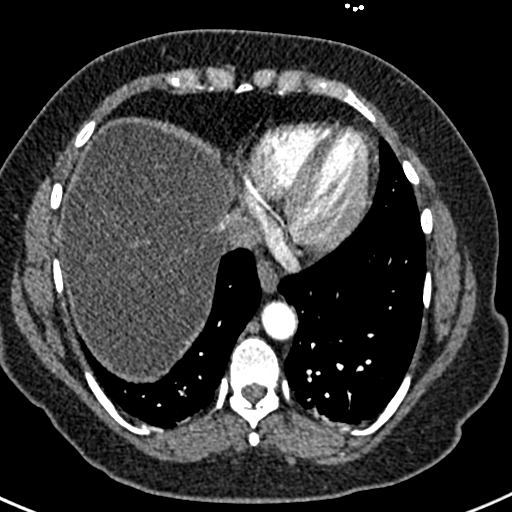
[im 79/259  lung]
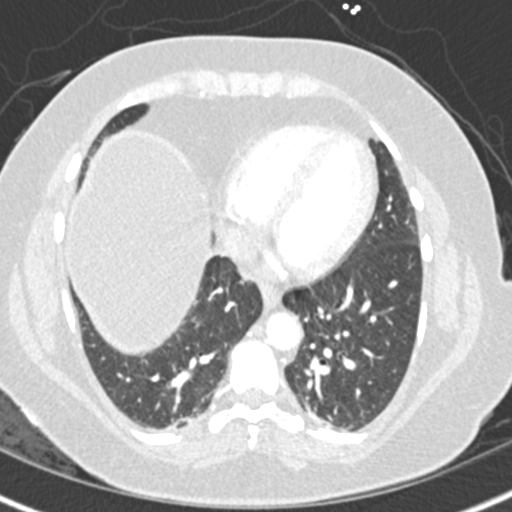
[im 101/259  soft-tissue]
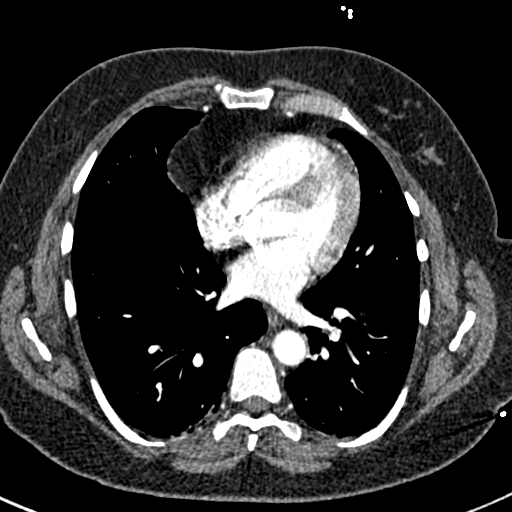
[im 113/259  lung]
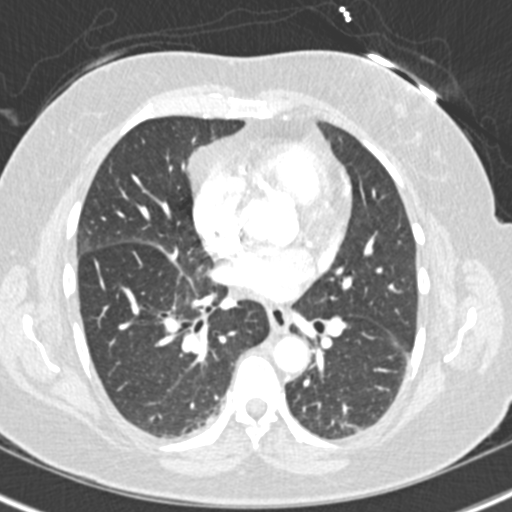
[im 135/259  soft-tissue]
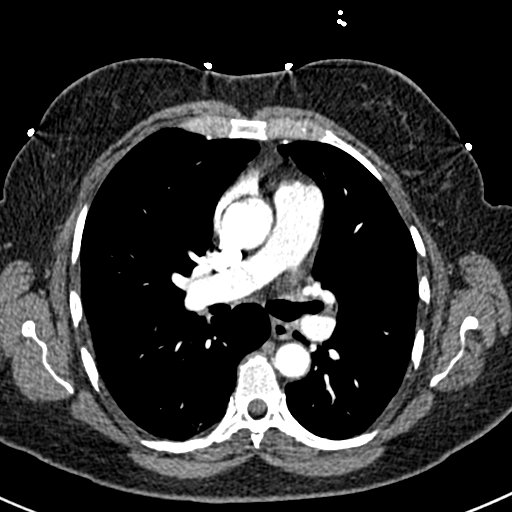
[im 146/259  lung]
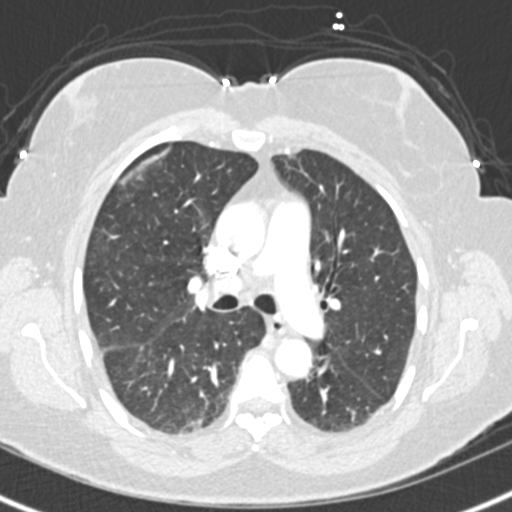
[im 158/259  soft-tissue]
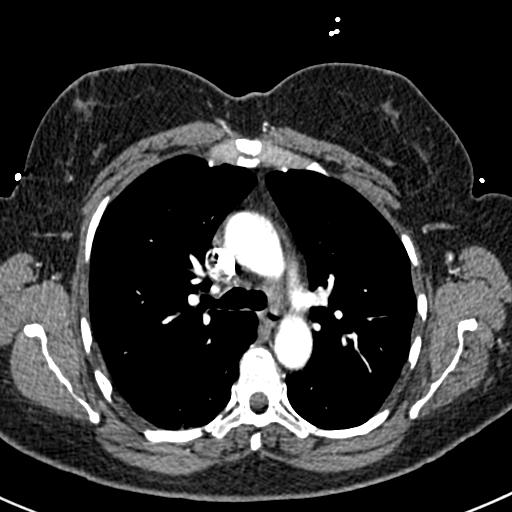
[im 180/259  lung]
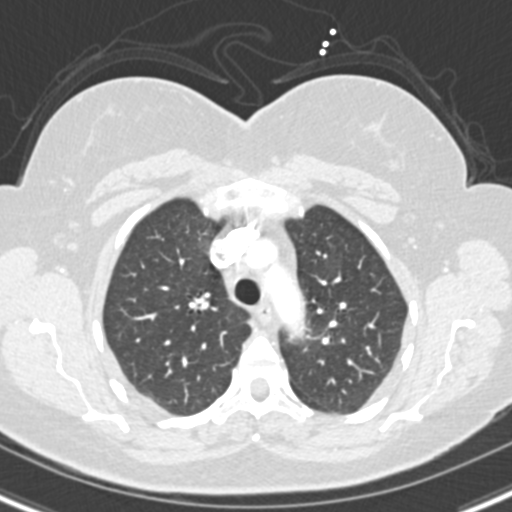
[im 191/259  soft-tissue]
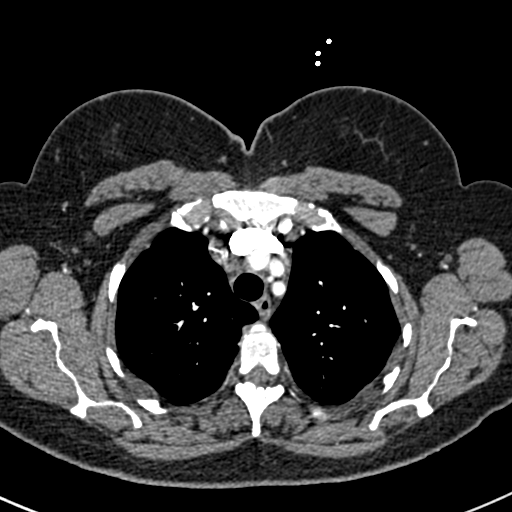
[im 214/259  lung]
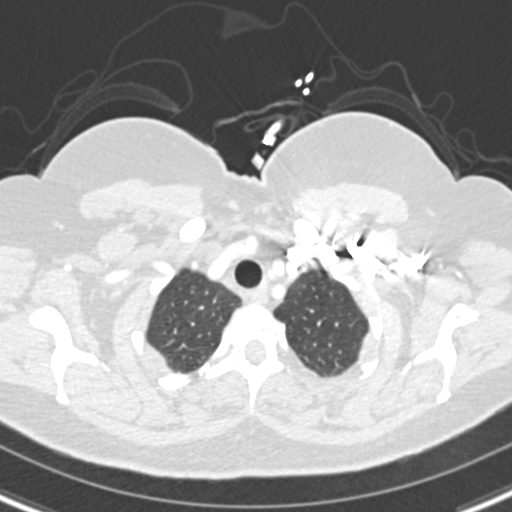
[im 225/259  soft-tissue]
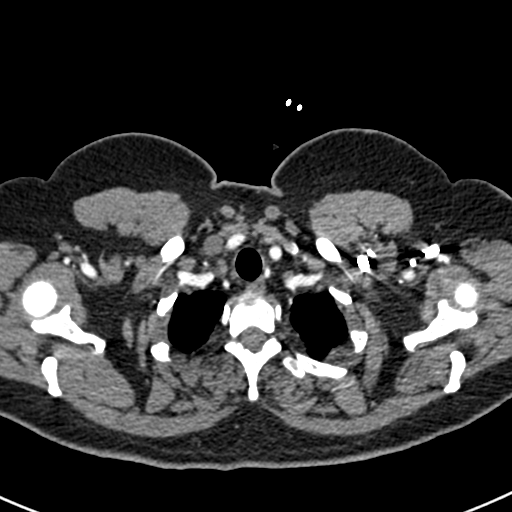
[im 247/259  lung]
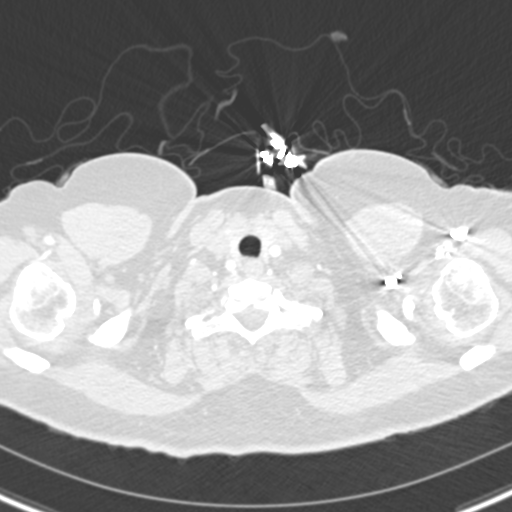

[Series 7: coronal mpr · coronal · 0.56mm/px · 3 of 99 slices shown]
[im 25/99  soft-tissue]
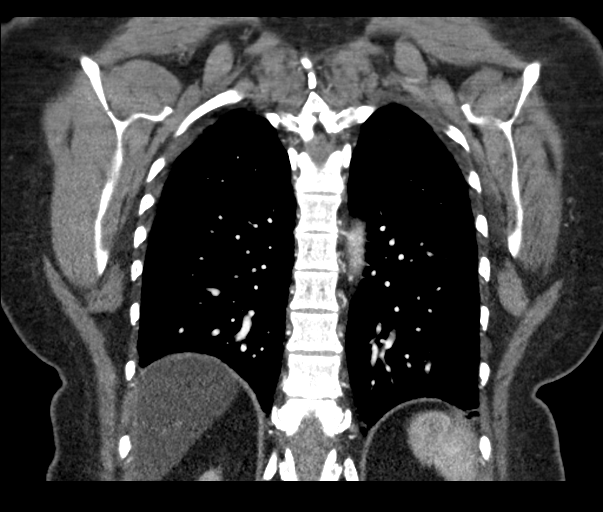
[im 50/99  soft-tissue]
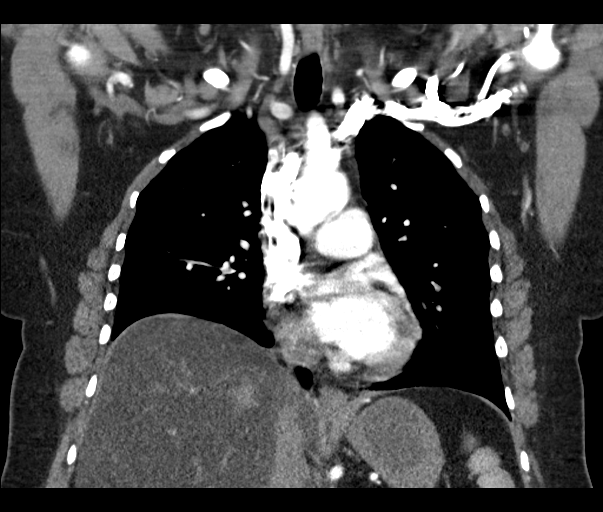
[im 74/99  soft-tissue]
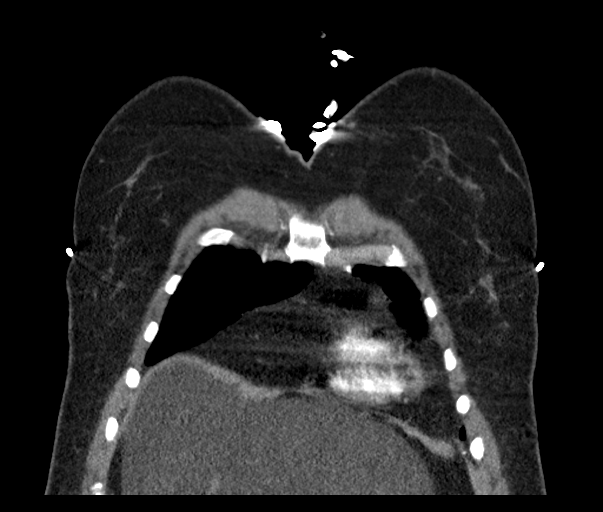

[18 of 46 positions shown; findings below may reference images not displayed]

FINDINGS: Cardiovascular: The heart is normal in size. No pericardial
effusion. The aorta is normal in caliber. Minimal atherosclerotic
calcifications at the aortic arch. There are significant age
advanced coronary artery calcifications noted.

The pulmonary arterial tree is fairly well opacified. No filling
defects to suggest pulmonary embolism.

Mediastinum/Nodes: No mediastinal or hilar mass or adenopathy. The
esophagus is grossly normal.

Lungs/Pleura: Minimal areas of subpleural atelectasis but no
infiltrates, edema or effusions. No worrisome pulmonary lesions or
pulmonary nodules.

Upper Abdomen: Diffuse and severe fatty infiltration of the liver.
No upper abdominal adenopathy.

Musculoskeletal: No breast masses, supraclavicular or axillary
adenopathy. The thyroid gland is unremarkable. The bony thorax is
intact.

Review of the MIP images confirms the above findings.
IMPRESSION: 1. No CT findings for pulmonary embolism.
2. Normal caliber thoracic aorta.  No dissection.
3. Age advanced coronary artery calcifications.
4. No acute pulmonary findings.
5. Diffuse and severe fatty infiltration of the liver.
6. Aortic atherosclerosis.

Aortic Atherosclerosis ([id number]).

## 2022-10-18 ENCOUNTER — Encounter: Payer: Self-pay | Admitting: Oncology

## 2022-10-20 ENCOUNTER — Encounter: Payer: Self-pay | Admitting: Oncology

## 2022-12-18 ENCOUNTER — Other Ambulatory Visit: Payer: Self-pay | Admitting: Physician Assistant

## 2022-12-18 DIAGNOSIS — Z1231 Encounter for screening mammogram for malignant neoplasm of breast: Secondary | ICD-10-CM

## 2023-01-10 LAB — COLOGUARD: COLOGUARD: POSITIVE — AB

## 2023-02-07 ENCOUNTER — Encounter: Payer: Self-pay | Admitting: Oncology

## 2023-02-19 ENCOUNTER — Ambulatory Visit: Payer: Managed Care, Other (non HMO)

## 2023-02-19 DIAGNOSIS — D120 Benign neoplasm of cecum: Secondary | ICD-10-CM | POA: Diagnosis not present

## 2023-02-19 DIAGNOSIS — R195 Other fecal abnormalities: Secondary | ICD-10-CM | POA: Diagnosis not present

## 2023-02-19 DIAGNOSIS — K640 First degree hemorrhoids: Secondary | ICD-10-CM | POA: Diagnosis not present

## 2023-02-19 DIAGNOSIS — Z1211 Encounter for screening for malignant neoplasm of colon: Secondary | ICD-10-CM | POA: Diagnosis not present

## 2023-03-14 ENCOUNTER — Encounter: Payer: Self-pay | Admitting: Oncology

## 2023-04-03 ENCOUNTER — Ambulatory Visit
Admission: RE | Admit: 2023-04-03 | Discharge: 2023-04-03 | Disposition: A | Discharge status: Home / Self Care | Payer: Managed Care, Other (non HMO) | Source: Ambulatory Visit | Attending: Physician Assistant | Admitting: Physician Assistant

## 2023-04-03 DIAGNOSIS — Z1231 Encounter for screening mammogram for malignant neoplasm of breast: Secondary | ICD-10-CM | POA: Insufficient documentation

## 2023-04-04 ENCOUNTER — Encounter: Payer: Self-pay | Admitting: Physician Assistant

## 2023-04-05 ENCOUNTER — Encounter: Payer: Self-pay | Admitting: Physician Assistant

## 2023-04-09 ENCOUNTER — Other Ambulatory Visit: Payer: Self-pay | Admitting: Physician Assistant

## 2023-04-09 DIAGNOSIS — N63 Unspecified lump in unspecified breast: Secondary | ICD-10-CM

## 2023-04-09 DIAGNOSIS — R928 Other abnormal and inconclusive findings on diagnostic imaging of breast: Secondary | ICD-10-CM

## 2023-04-12 ENCOUNTER — Ambulatory Visit
Admission: RE | Admit: 2023-04-12 | Discharge: 2023-04-12 | Disposition: A | Discharge status: Home / Self Care | Payer: Managed Care, Other (non HMO) | Source: Ambulatory Visit | Attending: Physician Assistant | Admitting: Physician Assistant

## 2023-04-12 DIAGNOSIS — R928 Other abnormal and inconclusive findings on diagnostic imaging of breast: Secondary | ICD-10-CM | POA: Insufficient documentation

## 2023-04-12 DIAGNOSIS — N63 Unspecified lump in unspecified breast: Secondary | ICD-10-CM | POA: Insufficient documentation

## 2023-04-12 DIAGNOSIS — N6002 Solitary cyst of left breast: Secondary | ICD-10-CM | POA: Insufficient documentation

## 2023-05-16 ENCOUNTER — Encounter: Payer: Self-pay | Admitting: Oncology

## 2023-06-03 NOTE — H&P (Signed)
[name] is a 47 y.o. female here for LAVH and  right salpingectomy  . Pt here for menorrhagia . Pt was on lysteda  which has  Not helped bleeding  2016 had a L/S sterilization  right hulka clip placed , no left tube prior salpingectomy  She was told that she had scar tissue , but opt not fails to state this . She denies pelvic pain , dyspareunia    EMBX: Comment: Part A-Endometrial Biopsy: EARLY SECRETORY ENDOMETRIUM (POST-OVULATORY DAY 4-5). NO EVIDENCE OF ENDOMETRITIS OR HYPERPLASIA. NEGATIVE FOR ATYPIA AND MALIGNANCY.   Past Medical History:  has a past medical history of Ectopic pregnancy ([facility]) and Kidney stones.  Past Surgical History:  has a past surgical history that includes Tubal ligation; Lithotripsy; ORIF distal radius fracture (Left, 10/22/2019); Endoscopic Carpal Tunnel Release (Left, 10/22/2019); and Colon @ [facility] (02/19/2023). Family History: family history is not on file. Social History:  reports that she has been smoking cigarettes. She has never used smokeless tobacco. She reports that she does not drink alcohol and does not use drugs. OB/GYN History:  OB History       Gravida  3   Para  2   Term      Preterm      AB  1   Living  2        SAB      IAB      Ectopic  1   Molar      Multiple      Live Births  2             Allergies: is allergic to sulfa (sulfonamide antibiotics), tramadol, and prochlorperazine edisylate. Medications:  Current Medications    Current Outpatient Medications:    tranexamic acid (LYSTEDA) 650 mg tablet, Take 2 tablets (1,300 mg total) by mouth 3 (three) times daily Take for a maximum of 5 days during monthly menstruation., Disp: 30 tablet, Rfl: 3   acetaminophen (TYLENOL) 500 MG tablet, Take 1,000 mg by mouth every 8 (eight) hours, Disp: , Rfl:    clobetasoL (TEMOVATE) 0.05 % ointment, Apply small amount to affected area 3 times a week for 1st month then can use to affected area 1-2 times weekly from then on  (Patient not taking: Reported on 05/07/2023), Disp: 30 g, Rfl: 2   ergocalciferol, vitamin D2, 1,250 mcg (50,000 unit) capsule, Take 50,000 Units by mouth once a week (Patient not taking: Reported on 05/07/2023), Disp: , Rfl:    HYDROcodone-acetaminophen (NORCO) 5-325 mg tablet, Take 1 tablet by mouth every 8 (eight) hours as needed (Patient not taking: Reported on 01/09/2023), Disp: 15 tablet, Rfl: 0   hydrocortisone 1 % cream, Apply topically 2 (two) times daily (Patient not taking: Reported on 01/17/2023), Disp: , Rfl:    medroxyPROGESTERone (PROVERA) 10 MG tablet, Take 10 mg by mouth once daily (Patient not taking: Reported on 01/17/2023), Disp: , Rfl:    sodium, potassium, and magnesium (SUPREP) oral solution, Take 1 Bottle by mouth as directed One kit contains 2 bottles.  Take both bottles at the times instructed by your provider. (Patient not taking: Reported on 02/06/2023), Disp: 354 mL, Rfl: 0     Review of Systems: General:                      No fatigue or weight loss Eyes:  No vision changes Ears:                            No hearing difficulty Respiratory:                No cough or shortness of breath Pulmonary:                  No asthma or shortness of breath Cardiovascular:           No chest pain, palpitations, dyspnea on exertion Gastrointestinal:          No abdominal bloating, chronic diarrhea, constipations, masses, pain or hematochezia Genitourinary:             No hematuria, dysuria, abnormal vaginal discharge, pelvic pain, Menometrorrhagia Lymphatic:                   No swollen lymph nodes Musculoskeletal:No muscle weakness Neurologic:                  No extremity weakness, syncope, seizure disorder Psychiatric:                  No history of depression, delusions or suicidal/homicidal ideation      Exam:       Vitals:    06/05/23 1417  BP: 130/83  Pulse: 105      Body mass index is 27.99 kg/m.   WDWN white/  female in NAD   Lungs: CTA   CV : RRR without murmur     Neck:  no thyromegaly Abdomen: soft , no mass, normal active bowel sounds,  non-tender, no rebound tenderness Pelvic: tanner stage 5 ,  External genitalia: vulva /labia no lesions Urethra: no prolapse Vagina: normal physiologic d/c adequate room for TVH / LAVH  Cervix: no lesions, no cervical motion tenderness   Uterus: normal size shape and contour, non-tender Adnexa: no mass,  non-tender   Rectovaginal:   After verbal consent was obtained : Endometrial biopsy: The cervix was cleaned with betadine and a single tooth tenaculum is applied to the anterior cervix. The Pipelle catheter was placed into the endometrial cavity. It sounds to 6 cm and adequate tissue was removed.     Impression:    The encounter diagnosis was Menorrhagia with regular cycle. Unresponsive to conservative tx.     Plan:     After discussing options again , ie.Toney Reil , endometrial ablation , definitive hysterectomy she has elected for hysterectomy . Given she said her surgeon from 2016 stated pelvic scar tissue. Op note does not reflect this . Given the possibility of scar tissue  I will offer a LAVH and right salpingectomy    Benefits and risks to surgery: The proposed benefit of the surgery has been discussed with the patient. The possible risks include, but are not limited to: organ injury to the bowel , bladder, ureters, and major blood vessels and nerves. There is a possibility of additional surgeries resulting from these injuries. There is also the risk of blood transfusion and the need to receive blood products during or after the procedure which may rarely lead to HIV or Hepatitis C infection. There is a risk of developing a deep venous thrombosis or a pulmonary embolism . There is the possibility of wound infection and also anesthetic complications, even the rare possibility of death. The patient understands these risks and wishes to proceed.      Holdyn Poyser  Elenora Gamma,  MD

## 2023-06-04 ENCOUNTER — Encounter: Payer: Self-pay | Admitting: Oncology

## 2023-06-06 DIAGNOSIS — J40 Bronchitis, not specified as acute or chronic: Secondary | ICD-10-CM

## 2023-06-06 HISTORY — DX: Bronchitis, not specified as acute or chronic: J40

## 2023-06-12 ENCOUNTER — Encounter
Admission: RE | Admit: 2023-06-12 | Discharge: 2023-06-12 | Disposition: A | Discharge status: Home / Self Care | Payer: Managed Care, Other (non HMO) | Source: Ambulatory Visit | Attending: Obstetrics and Gynecology | Admitting: Obstetrics and Gynecology

## 2023-06-12 HISTORY — DX: Abnormal results of thyroid function studies: R94.6

## 2023-06-12 HISTORY — DX: Excessive and frequent menstruation with regular cycle: N92.0

## 2023-06-12 HISTORY — DX: Localized enlarged lymph nodes: R59.0

## 2023-06-12 HISTORY — DX: Palpitations: R00.2

## 2023-06-12 HISTORY — DX: Herpesviral infection, unspecified: B00.9

## 2023-06-12 NOTE — Patient Instructions (Signed)
Your procedure is scheduled on:06-20-23 Thursday Report to the Registration Desk on the 1st floor of the [facility].Then proceed to the 2nd floor Surgery Desk To find out your arrival time, please call [phone] between 1PM - 3PM on:06-19-23 Wednesday If your arrival time is 6:00 am, do not arrive before that time as the [facility] entrance doors do not open until 6:00 am.  REMEMBER: Instructions that are not followed completely may result in serious medical risk, up to and including death; or upon the discretion of your surgeon and anesthesiologist your surgery may need to be rescheduled.  Do not eat food after midnight the night before surgery.  No gum chewing or hard candies.  You may however, drink CLEAR liquids up to 2 hours before you are scheduled to arrive for your surgery. Do not drink anything within 2 hours of your scheduled arrival time.  Clear liquids include: - water  - apple juice without pulp - gatorade (not RED colors) - black coffee or tea (Do NOT add milk or creamers to the coffee or tea) Do NOT drink anything that is not on this list.  In addition, your doctor has ordered for you to drink the provided:  Ensure Pre-Surgery Clear Carbohydrate Drink  Drinking this carbohydrate drink up to two hours before surgery helps to reduce insulin resistance and improve patient outcomes. Please complete drinking 2 hours before scheduled arrival time.  One week prior to surgery: Stop Anti-inflammatories (NSAIDS) such as Advil, Aleve, Ibuprofen, Motrin, Naproxen, Naprosyn and Aspirin based products such as Excedrin, Goody's Powder, BC Powder.You may however, take Tylenol if needed for pain up until the day of surgery. Stop ANY OVER THE COUNTER supplements/vitamins NOW (06-12-23) until after surgery (Magnesium Citrate)   Continue taking all prescribed medications   Do NOT take any medication the day of surgery  Bring your Albuterol Inhaler to the hospital  No Alcohol for 24  hours before or after surgery.  No Smoking including e-cigarettes for 24 hours before surgery.  No chewable tobacco products for at least 6 hours before surgery.  No nicotine patches on the day of surgery.  Do not use any "recreational" drugs for at least a week (preferably 2 weeks) before your surgery.  Please be advised that the combination of cocaine and anesthesia may have negative outcomes, up to and including death. If you test positive for cocaine, your surgery will be cancelled.  On the morning of surgery brush your teeth with toothpaste and water, you may rinse your mouth with mouthwash if you wish. Do not swallow any toothpaste or mouthwash.  Use CHG Soap as directed on instruction sheet.  Do not wear jewelry, make-up, hairpins, clips or nail polish.  Do not wear lotions, powders, or perfumes.   Do not shave body hair from the neck down 48 hours before surgery.  Contact lenses, hearing aids and dentures may not be worn into surgery.  Do not bring valuables to the hospital. [facility] is not responsible for any missing/lost belongings or valuables.   Notify your doctor if there is any change in your medical condition (cold, fever, infection).  Wear comfortable clothing (specific to your surgery type) to the hospital.  After surgery, you can help prevent lung complications by doing breathing exercises.  Take deep breaths and cough every 1-2 hours. Your doctor may order a device called an Incentive Spirometer to help you take deep breaths. When coughing or sneezing, hold a pillow firmly against your incision with both  hands. This is called "splinting." Doing this helps protect your incision. It also decreases belly discomfort.  If you are being admitted to the hospital overnight, leave your suitcase in the car. After surgery it may be brought to your room.  In case of increased patient census, it may be necessary for you, the patient, to continue your postoperative care in  the Same Day Surgery department.  If you are being discharged the day of surgery, you will not be allowed to drive home. You will need a responsible individual to drive you home and stay with you for 24 hours after surgery.   If you are taking public transportation, you will need to have a responsible individual with you.  Please call the Pre-admissions Testing Dept. at [phone] if you have any questions about these instructions.  Surgery Visitation Policy:  Patients having surgery or a procedure may have two visitors.  Children under the age of 85 must have an adult with them who is not the patient.     Preparing for Surgery with CHLORHEXIDINE GLUCONATE (CHG) Soap  Chlorhexidine Gluconate (CHG) Soap  o An antiseptic cleaner that kills germs and bonds with the skin to continue killing germs even after washing  o Used for showering the night before surgery and morning of surgery  Before surgery, you can play an important role by reducing the number of germs on your skin.  CHG (Chlorhexidine gluconate) soap is an antiseptic cleanser which kills germs and bonds with the skin to continue killing germs even after washing.  Please do not use if you have an allergy to CHG or antibacterial soaps. If your skin becomes reddened/irritated stop using the CHG.  1. Shower the NIGHT BEFORE SURGERY and the MORNING OF SURGERY with CHG soap.  2. If you choose to wash your hair, wash your hair first as usual with your normal shampoo.  3. After shampooing, rinse your hair and body thoroughly to remove the shampoo.  4. Use CHG as you would any other liquid soap. You can apply CHG directly to the skin and wash gently with a scrungie or a clean washcloth.  5. Apply the CHG soap to your body only from the neck down. Do not use on open wounds or open sores. Avoid contact with your eyes, ears, mouth, and genitals (private parts). Wash face and genitals (private parts) with your normal soap.  6.  Wash thoroughly, paying special attention to the area where your surgery will be performed.  7. Thoroughly rinse your body with warm water.  8. Do not shower/wash with your normal soap after using and rinsing off the CHG soap.  9. Pat yourself dry with a clean towel.  10. Wear clean pajamas to bed the night before surgery.  12. Place clean sheets on your bed the night of your first shower and do not sleep with pets.  13. Shower again with the CHG soap on the day of surgery prior to arriving at the hospital.  14. Do not apply any deodorants/lotions/powders.  15. Please wear clean clothes to the hospital.  How to Use an Incentive Spirometer An incentive spirometer is a tool that measures how well you are filling your lungs with each breath. Learning to take long, deep breaths using this tool can help you keep your lungs clear and active. This may help to reverse or lessen your chance of developing breathing (pulmonary) problems, especially infection. You may be asked to use a spirometer: After a surgery. If  you have a lung problem or a history of smoking. After a long period of time when you have been unable to move or be active. If the spirometer includes an indicator to show the highest number that you have reached, your health care provider or respiratory therapist will help you set a goal. Keep a log of your progress as told by your health care provider. What are the risks? Breathing too quickly may cause dizziness or cause you to pass out. Take your time so you do not get dizzy or light-headed. If you are in pain, you may need to take pain medicine before doing incentive spirometry. It is harder to take a deep breath if you are having pain. How to use your incentive spirometer  Sit up on the edge of your bed or on a chair. Hold the incentive spirometer so that it is in an upright position. Before you use the spirometer, breathe out normally. Place the mouthpiece in your mouth. Make  sure your lips are closed tightly around it. Breathe in slowly and as deeply as you can through your mouth, causing the piston or the ball to rise toward the top of the chamber. Hold your breath for 3-5 seconds, or for as long as possible. If the spirometer includes a coach indicator, use this to guide you in breathing. Slow down your breathing if the indicator goes above the marked areas. Remove the mouthpiece from your mouth and breathe out normally. The piston or ball will return to the bottom of the chamber. Rest for a few seconds, then repeat the steps 10 or more times. Take your time and take a few normal breaths between deep breaths so that you do not get dizzy or light-headed. Do this every 1-2 hours when you are awake. If the spirometer includes a goal marker to show the highest number you have reached (best effort), use this as a goal to work toward during each repetition. After each set of 10 deep breaths, cough a few times. This will help to make sure that your lungs are clear. If you have an incision on your chest or abdomen from surgery, place a pillow or a rolled-up towel firmly against the incision when you cough. This can help to reduce pain while taking deep breaths and coughing. General tips When you are able to get out of bed: Walk around often. Continue to take deep breaths and cough in order to clear your lungs. Keep using the incentive spirometer until your health care provider says it is okay to stop using it. If you have been in the hospital, you may be told to keep using the spirometer at home. Contact a health care provider if: You are having difficulty using the spirometer. You have trouble using the spirometer as often as instructed. Your pain medicine is not giving enough relief for you to use the spirometer as told. You have a fever. Get help right away if: You develop shortness of breath. You develop a cough with bloody mucus from the lungs. You have fluid or  blood coming from an incision site after you cough. Summary An incentive spirometer is a tool that can help you learn to take long, deep breaths to keep your lungs clear and active. You may be asked to use a spirometer after a surgery, if you have a lung problem or a history of smoking, or if you have been inactive for a long period of time. Use your incentive spirometer as instructed every  1-2 hours while you are awake. If you have an incision on your chest or abdomen, place a pillow or a rolled-up towel firmly against your incision when you cough. This will help to reduce pain. Get help right away if you have shortness of breath, you cough up bloody mucus, or blood comes from your incision when you cough. This information is not intended to replace advice given to you by your health care provider. Make sure you discuss any questions you have with your health care provider. Document Revised: 01/11/2020 Document Reviewed: 01/11/2020 Elsevier Patient Education  2024 [employer].

## 2023-06-17 ENCOUNTER — Other Ambulatory Visit: Payer: Self-pay | Admitting: Physician Assistant

## 2023-06-17 ENCOUNTER — Ambulatory Visit
Admission: RE | Admit: 2023-06-17 | Discharge: 2023-06-17 | Disposition: A | Discharge status: Home / Self Care | Payer: Managed Care, Other (non HMO) | Source: Ambulatory Visit | Attending: Physician Assistant | Admitting: Physician Assistant

## 2023-06-17 ENCOUNTER — Ambulatory Visit
Admission: RE | Admit: 2023-06-17 | Discharge: 2023-06-17 | Disposition: A | Discharge status: Home / Self Care | Payer: Managed Care, Other (non HMO) | Attending: Physician Assistant | Admitting: Physician Assistant

## 2023-06-17 DIAGNOSIS — J159 Unspecified bacterial pneumonia: Secondary | ICD-10-CM | POA: Diagnosis not present

## 2023-06-18 ENCOUNTER — Inpatient Hospital Stay: Admission: RE | Admit: 2023-06-18 | Payer: Managed Care, Other (non HMO) | Source: Ambulatory Visit

## 2023-06-20 DIAGNOSIS — Z01818 Encounter for other preprocedural examination: Secondary | ICD-10-CM

## 2023-07-11 ENCOUNTER — Encounter
Admission: RE | Admit: 2023-07-11 | Discharge: 2023-07-11 | Disposition: A | Discharge status: Home / Self Care | Payer: Managed Care, Other (non HMO) | Source: Ambulatory Visit | Attending: Obstetrics and Gynecology | Admitting: Obstetrics and Gynecology

## 2023-07-11 HISTORY — DX: Pneumonia, unspecified organism: J18.9

## 2023-07-11 NOTE — Pre-Procedure Instructions (Signed)
Follow up PAT call completed, medications, Hx and allergies reviewed, instructions reviewed, all questions answered,

## 2023-07-11 NOTE — Patient Instructions (Signed)
Your procedure is scheduled on:07/18/23  Report to the Registration Desk on the 1st floor of the [facility].Then proceed to the 2nd floor Surgery Desk To find out your arrival time, please call [phone] between 1PM - 3PM on:07/17/23 If your arrival time is 6:00 am, do not arrive before that time as the [facility] entrance doors do not open until 6:00 am.   REMEMBER: Instructions that are not followed completely may result in serious medical risk, up to and including death; or upon the discretion of your surgeon and anesthesiologist your surgery may need to be rescheduled.   Do not eat food after midnight the night before surgery.  No gum chewing or hard candies.   You may however, drink CLEAR liquids up to 2 hours before you are scheduled to arrive for your surgery. Do not drink anything within 2 hours of your scheduled arrival time.   Clear liquids include: - water  - apple juice without pulp - gatorade (not RED colors) - black coffee or tea (Do NOT add milk or creamers to the coffee or tea) Do NOT drink anything that is not on this list.   In addition, your doctor has ordered for you to drink the provided:  Ensure Pre-Surgery Clear Carbohydrate Drink  Drinking this carbohydrate drink up to two hours before surgery helps to reduce insulin resistance and improve patient outcomes. Please complete drinking 2 hours before scheduled arrival time.   One week prior to surgery: Stop Anti-inflammatories (NSAIDS) such as Advil, Aleve, Ibuprofen, Motrin, Naproxen, Naprosyn and Aspirin based products such as Excedrin, Goody's Powder, BC Powder.You may however, take Tylenol if needed for pain up until the day of surgery. Stop ANY OVER THE COUNTER supplements/vitamins NOW  until after surgery (Magnesium Citrate)     Continue taking all prescribed medications    Do NOT take any medication the day of surgery   Bring your Albuterol Inhaler to the hospital   No Alcohol for 24 hours before  or after surgery.   No Smoking including e-cigarettes for 24 hours before surgery.  No chewable tobacco products for at least 6 hours before surgery.  No nicotine patches on the day of surgery.   Do not use any "recreational" drugs for at least a week (preferably 2 weeks) before your surgery.  Please be advised that the combination of cocaine and anesthesia may have negative outcomes, up to and including death. If you test positive for cocaine, your surgery will be cancelled.   On the morning of surgery brush your teeth with toothpaste and water, you may rinse your mouth with mouthwash if you wish. Do not swallow any toothpaste or mouthwash.   Use CHG Soap as directed on instruction sheet.   Do not wear jewelry, make-up, hairpins, clips or nail polish.   Do not wear lotions, powders, or perfumes.    Do not shave body hair from the neck down 48 hours before surgery.   Contact lenses, hearing aids and dentures may not be worn into surgery.   Do not bring valuables to the hospital. [facility] is not responsible for any missing/lost belongings or valuables.    Notify your doctor if there is any change in your medical condition (cold, fever, infection).   Wear comfortable clothing (specific to your surgery type) to the hospital.   After surgery, you can help prevent lung complications by doing breathing exercises.  Take deep breaths and cough every 1-2 hours. Your doctor may order a device called  an Facilities manager to help you take deep breaths. When coughing or sneezing, hold a pillow firmly against your incision with both hands. This is called "splinting." Doing this helps protect your incision. It also decreases belly discomfort.   If you are being admitted to the hospital overnight, leave your suitcase in the car. After surgery it may be brought to your room.   In case of increased patient census, it may be necessary for you, the patient, to continue your postoperative care in  the Same Day Surgery department.   If you are being discharged the day of surgery, you will not be allowed to drive home. You will need a responsible individual to drive you home and stay with you for 24 hours after surgery.    If you are taking public transportation, you will need to have a responsible individual with you.   Please call the Pre-admissions Testing Dept. at [phone] if you have any questions about these instructions.   Surgery Visitation Policy:   Patients having surgery or a procedure may have two visitors.  Children under the age of 55 must have an adult with them who is not the patient.        Preparing for Surgery with CHLORHEXIDINE GLUCONATE (CHG) Soap   Chlorhexidine Gluconate (CHG) Soap   o An antiseptic cleaner that kills germs and bonds with the skin to continue killing germs even after washing   o Used for showering the night before surgery and morning of surgery   Before surgery, you can play an important role by reducing the number of germs on your skin.  CHG (Chlorhexidine gluconate) soap is an antiseptic cleanser which kills germs and bonds with the skin to continue killing germs even after washing.   Please do not use if you have an allergy to CHG or antibacterial soaps. If your skin becomes reddened/irritated stop using the CHG.   1. Shower the NIGHT BEFORE SURGERY and the MORNING OF SURGERY with CHG soap.   2. If you choose to wash your hair, wash your hair first as usual with your normal shampoo.   3. After shampooing, rinse your hair and body thoroughly to remove the shampoo.   4. Use CHG as you would any other liquid soap. You can apply CHG directly to the skin and wash gently with a scrungie or a clean washcloth.   5. Apply the CHG soap to your body only from the neck down. Do not use on open wounds or open sores. Avoid contact with your eyes, ears, mouth, and genitals (private parts). Wash face and genitals (private parts) with your  normal soap.   6. Wash thoroughly, paying special attention to the area where your surgery will be performed.   7. Thoroughly rinse your body with warm water.   8. Do not shower/wash with your normal soap after using and rinsing off the CHG soap.   9. Pat yourself dry with a clean towel.   10. Wear clean pajamas to bed the night before surgery.   12. Place clean sheets on your bed the night of your first shower and do not sleep with pets.   13. Shower again with the CHG soap on the day of surgery prior to arriving at the hospital.   14. Do not apply any deodorants/lotions/powders.   15. Please wear clean clothes to the hospital.   How to Use an Incentive Spirometer  An incentive spirometer is a tool that measures how well you  are filling your lungs with each breath. Learning to take long, deep breaths using this tool can help you keep your lungs clear and active. This may help to reverse or lessen your chance of developing breathing (pulmonary) problems, especially infection. You may be asked to use a spirometer: After a surgery. If you have a lung problem or a history of smoking. After a long period of time when you have been unable to move or be active. If the spirometer includes an indicator to show the highest number that you have reached, your health care provider or respiratory therapist will help you set a goal. Keep a log of your progress as told by your health care provider. What are the risks? Breathing too quickly may cause dizziness or cause you to pass out. Take your time so you do not get dizzy or light-headed. If you are in pain, you may need to take pain medicine before doing incentive spirometry. It is harder to take a deep breath if you are having pain. How to use your incentive spirometer  Sit up on the edge of your bed or on a chair. Hold the incentive spirometer so that it is in an upright position. Before you use the spirometer, breathe out normally. Place the  mouthpiece in your mouth. Make sure your lips are closed tightly around it. Breathe in slowly and as deeply as you can through your mouth, causing the piston or the ball to rise toward the top of the chamber. Hold your breath for 3-5 seconds, or for as long as possible. If the spirometer includes a coach indicator, use this to guide you in breathing. Slow down your breathing if the indicator goes above the marked areas. Remove the mouthpiece from your mouth and breathe out normally. The piston or ball will return to the bottom of the chamber. Rest for a few seconds, then repeat the steps 10 or more times. Take your time and take a few normal breaths between deep breaths so that you do not get dizzy or light-headed. Do this every 1-2 hours when you are awake. If the spirometer includes a goal marker to show the highest number you have reached (best effort), use this as a goal to work toward during each repetition. After each set of 10 deep breaths, cough a few times. This will help to make sure that your lungs are clear. If you have an incision on your chest or abdomen from surgery, place a pillow or a rolled-up towel firmly against the incision when you cough. This can help to reduce pain while taking deep breaths and coughing. General tips When you are able to get out of bed: Walk around often. Continue to take deep breaths and cough in order to clear your lungs. Keep using the incentive spirometer until your health care provider says it is okay to stop using it. If you have been in the hospital, you may be told to keep using the spirometer at home. Contact a health care provider if: You are having difficulty using the spirometer. You have trouble using the spirometer as often as instructed. Your pain medicine is not giving enough relief for you to use the spirometer as told. You have a fever. Get help right away if: You develop shortness of breath. You develop a cough with bloody mucus from  the lungs. You have fluid or blood coming from an incision site after you cough. Summary An incentive spirometer is a tool that can help you learn  to take long, deep breaths to keep your lungs clear and active. You may be asked to use a spirometer after a surgery, if you have a lung problem or a history of smoking, or if you have been inactive for a long period of time. Use your incentive spirometer as instructed every 1-2 hours while you are awake. If you have an incision on your chest or abdomen, place a pillow or a rolled-up towel firmly against your incision when you cough. This will help to reduce pain. Get help right away if you have shortness of breath, you cough up bloody mucus, or blood comes from your incision when you cough. This information is not intended to replace advice given to you by your health care provider. Make sure you discuss any questions you have with your health care provider. Document Revised: 01/11/2020 Document Reviewed: 01/11/2020 Elsevier Patient Education  2024 [employer].

## 2023-07-15 ENCOUNTER — Encounter
Admission: RE | Admit: 2023-07-15 | Discharge: 2023-07-15 | Disposition: A | Discharge status: Home / Self Care | Payer: Managed Care, Other (non HMO) | Source: Ambulatory Visit | Attending: Obstetrics and Gynecology | Admitting: Obstetrics and Gynecology

## 2023-07-15 DIAGNOSIS — Z01818 Encounter for other preprocedural examination: Secondary | ICD-10-CM

## 2023-07-15 DIAGNOSIS — Z01812 Encounter for preprocedural laboratory examination: Secondary | ICD-10-CM | POA: Diagnosis not present

## 2023-07-15 LAB — BASIC METABOLIC PANEL
Anion gap: 10 (ref 5–15)
BUN: 10 mg/dL (ref 6–20)
CO2: 23 mmol/L (ref 22–32)
Calcium: 8.8 mg/dL — ABNORMAL LOW (ref 8.9–10.3)
Chloride: 107 mmol/L (ref 98–111)
Creatinine, Ser: 0.64 mg/dL (ref 0.44–1.00)
GFR, Estimated: >60 mL/min (ref >60)
Glucose, Bld: 113 mg/dL — ABNORMAL HIGH (ref 70–99)
Potassium: 3.6 mmol/L (ref 3.5–5.1)
Sodium: 140 mmol/L (ref 135–145)

## 2023-07-15 LAB — CBC
HCT: 41.2 % (ref 36.0–46.0)
Hemoglobin: 14.2 g/dL (ref 12.0–15.0)
MCH: 30.4 pg (ref 26.0–34.0)
MCHC: 34.5 g/dL (ref 30.0–36.0)
MCV: 88.2 fL (ref 80.0–100.0)
Platelets: 205 10*3/uL (ref 150–400)
RBC: 4.67 MIL/uL (ref 3.87–5.11)
RDW: 13.3 % (ref 11.5–15.5)
WBC: 9.3 10*3/uL (ref 4.0–10.5)
nRBC: 0 % (ref 0.0–0.2)

## 2023-07-15 LAB — TYPE AND SCREEN
ABO/RH(D): O POS
Antibody Screen: NEGATIVE

## 2023-07-17 MED ORDER — CEFAZOLIN SODIUM-DEXTROSE 2-4 GM/100ML-% IV SOLN
2.0000 g | Freq: Once | INTRAVENOUS | Status: AC
  Administered 2023-07-18: 2 g via INTRAVENOUS

## 2023-07-17 MED ORDER — ORAL CARE MOUTH RINSE: 15.0000 mL | Freq: Once | OROMUCOSAL | Status: AC

## 2023-07-17 MED ORDER — CHLORHEXIDINE GLUCONATE 0.12 % MT SOLN
15.0000 mL | Freq: Once | OROMUCOSAL | Status: AC
  Administered 2023-07-18: 15 mL via OROMUCOSAL

## 2023-07-17 MED ORDER — LACTATED RINGERS IV SOLN: INTRAVENOUS | Status: DC

## 2023-07-17 MED ORDER — GABAPENTIN 300 MG PO CAPS
300.0000 mg | ORAL_CAPSULE | ORAL | Status: AC
  Administered 2023-07-18: 300 mg via ORAL

## 2023-07-17 MED ORDER — ACETAMINOPHEN 500 MG PO TABS
1000.0000 mg | ORAL_TABLET | ORAL | Status: AC
  Administered 2023-07-18: 1000 mg via ORAL

## 2023-07-17 MED ORDER — FAMOTIDINE 20 MG PO TABS
20.0000 mg | ORAL_TABLET | Freq: Once | ORAL | Status: AC
  Administered 2023-07-18: 20 mg via ORAL

## 2023-07-17 MED ORDER — POVIDONE-IODINE 10 % EX SWAB
2.0000 | Freq: Once | CUTANEOUS | Status: AC
  Administered 2023-07-18: 2 via TOPICAL

## 2023-07-18 ENCOUNTER — Other Ambulatory Visit: Payer: Self-pay

## 2023-07-18 ENCOUNTER — Ambulatory Visit: Payer: Self-pay | Admitting: Urgent Care

## 2023-07-18 ENCOUNTER — Observation Stay: Payer: Managed Care, Other (non HMO)

## 2023-07-18 ENCOUNTER — Ambulatory Visit: Payer: Managed Care, Other (non HMO) | Admitting: Anesthesiology

## 2023-07-18 ENCOUNTER — Ambulatory Visit: Payer: Managed Care, Other (non HMO)

## 2023-07-18 ENCOUNTER — Inpatient Hospital Stay
Admission: RE | Admit: 2023-07-18 | Discharge: 2023-07-24 | DRG: 982 | Disposition: A | Discharge status: Home / Self Care | Payer: Managed Care, Other (non HMO) | Attending: Internal Medicine | Admitting: Internal Medicine

## 2023-07-18 ENCOUNTER — Encounter: Payer: Self-pay | Admitting: Obstetrics and Gynecology

## 2023-07-18 ENCOUNTER — Encounter
Admission: RE | Disposition: A | Discharge status: Home / Self Care | Payer: Self-pay | Source: Home / Self Care | Attending: Internal Medicine

## 2023-07-18 DIAGNOSIS — R000 Tachycardia, unspecified: Secondary | ICD-10-CM

## 2023-07-18 DIAGNOSIS — Z882 Allergy status to sulfonamides status: Secondary | ICD-10-CM

## 2023-07-18 DIAGNOSIS — D72825 Bandemia: Secondary | ICD-10-CM

## 2023-07-18 DIAGNOSIS — F17210 Nicotine dependence, cigarettes, uncomplicated: Secondary | ICD-10-CM | POA: Diagnosis present

## 2023-07-18 DIAGNOSIS — Z01818 Encounter for other preprocedural examination: Principal | ICD-10-CM

## 2023-07-18 DIAGNOSIS — R110 Nausea: Secondary | ICD-10-CM | POA: Diagnosis not present

## 2023-07-18 DIAGNOSIS — Z01810 Encounter for preprocedural cardiovascular examination: Secondary | ICD-10-CM | POA: Diagnosis not present

## 2023-07-18 DIAGNOSIS — Z90710 Acquired absence of both cervix and uterus: Secondary | ICD-10-CM | POA: Diagnosis not present

## 2023-07-18 DIAGNOSIS — Z9079 Acquired absence of other genital organ(s): Secondary | ICD-10-CM

## 2023-07-18 DIAGNOSIS — R079 Chest pain, unspecified: Secondary | ICD-10-CM

## 2023-07-18 DIAGNOSIS — I50810 Right heart failure, unspecified: Secondary | ICD-10-CM | POA: Diagnosis present

## 2023-07-18 DIAGNOSIS — D72829 Elevated white blood cell count, unspecified: Secondary | ICD-10-CM | POA: Insufficient documentation

## 2023-07-18 DIAGNOSIS — R0789 Other chest pain: Secondary | ICD-10-CM | POA: Diagnosis present

## 2023-07-18 DIAGNOSIS — F419 Anxiety disorder, unspecified: Secondary | ICD-10-CM | POA: Diagnosis present

## 2023-07-18 DIAGNOSIS — J9601 Acute respiratory failure with hypoxia: Principal | ICD-10-CM | POA: Insufficient documentation

## 2023-07-18 DIAGNOSIS — Z79899 Other long term (current) drug therapy: Secondary | ICD-10-CM

## 2023-07-18 DIAGNOSIS — K760 Fatty (change of) liver, not elsewhere classified: Secondary | ICD-10-CM | POA: Diagnosis not present

## 2023-07-18 DIAGNOSIS — E669 Obesity, unspecified: Secondary | ICD-10-CM | POA: Diagnosis present

## 2023-07-18 DIAGNOSIS — I2510 Atherosclerotic heart disease of native coronary artery without angina pectoris: Secondary | ICD-10-CM

## 2023-07-18 DIAGNOSIS — I272 Pulmonary hypertension, unspecified: Secondary | ICD-10-CM | POA: Diagnosis present

## 2023-07-18 DIAGNOSIS — Z6829 Body mass index (BMI) 29.0-29.9, adult: Secondary | ICD-10-CM

## 2023-07-18 DIAGNOSIS — I270 Primary pulmonary hypertension: Secondary | ICD-10-CM | POA: Diagnosis present

## 2023-07-18 DIAGNOSIS — Z888 Allergy status to other drugs, medicaments and biological substances status: Secondary | ICD-10-CM

## 2023-07-18 DIAGNOSIS — I700 Atherosclerosis of aorta: Secondary | ICD-10-CM | POA: Diagnosis present

## 2023-07-18 DIAGNOSIS — N920 Excessive and frequent menstruation with regular cycle: Secondary | ICD-10-CM | POA: Diagnosis present

## 2023-07-18 DIAGNOSIS — I361 Nonrheumatic tricuspid (valve) insufficiency: Secondary | ICD-10-CM | POA: Diagnosis present

## 2023-07-18 DIAGNOSIS — Z1152 Encounter for screening for COVID-19: Secondary | ICD-10-CM

## 2023-07-18 DIAGNOSIS — E785 Hyperlipidemia, unspecified: Secondary | ICD-10-CM | POA: Diagnosis present

## 2023-07-18 DIAGNOSIS — Z87442 Personal history of urinary calculi: Secondary | ICD-10-CM

## 2023-07-18 HISTORY — PX: LAPAROSCOPIC VAGINAL HYSTERECTOMY WITH SALPINGECTOMY: SHX6680

## 2023-07-18 LAB — CBC
HCT: 38.3 % (ref 36.0–46.0)
Hemoglobin: 12.8 g/dL (ref 12.0–15.0)
MCH: 30.1 pg (ref 26.0–34.0)
MCHC: 33.4 g/dL (ref 30.0–36.0)
MCV: 90.1 fL (ref 80.0–100.0)
Platelets: 225 10*3/uL (ref 150–400)
RBC: 4.25 MIL/uL (ref 3.87–5.11)
RDW: 13.4 % (ref 11.5–15.5)
WBC: 13.4 10*3/uL — ABNORMAL HIGH (ref 4.0–10.5)
nRBC: 0 % (ref 0.0–0.2)

## 2023-07-18 LAB — BASIC METABOLIC PANEL
Anion gap: 14 (ref 5–15)
BUN: 9 mg/dL (ref 6–20)
CO2: 18 mmol/L — ABNORMAL LOW (ref 22–32)
Calcium: 8.3 mg/dL — ABNORMAL LOW (ref 8.9–10.3)
Chloride: 104 mmol/L (ref 98–111)
Creatinine, Ser: 0.85 mg/dL (ref 0.44–1.00)
GFR, Estimated: >60 mL/min (ref >60)
Glucose, Bld: 168 mg/dL — ABNORMAL HIGH (ref 70–99)
Potassium: 4.3 mmol/L (ref 3.5–5.1)
Sodium: 136 mmol/L (ref 135–145)

## 2023-07-18 LAB — POCT PREGNANCY, URINE: Preg Test, Ur: NEGATIVE

## 2023-07-18 LAB — PROCALCITONIN: Procalcitonin: <0.1 ng/mL

## 2023-07-18 LAB — LIPID PANEL
Cholesterol: 203 mg/dL — ABNORMAL HIGH (ref 0–200)
HDL: 41 mg/dL (ref >40)
LDL Cholesterol: 146 mg/dL — ABNORMAL HIGH (ref 0–99)
Total CHOL/HDL Ratio: 5 ratio
Triglycerides: 79 mg/dL (ref <150)
VLDL: 16 mg/dL (ref 0–40)

## 2023-07-18 LAB — PROTIME-INR
INR: 1.1 (ref 0.8–1.2)
Prothrombin Time: 14.8 s (ref 11.4–15.2)

## 2023-07-18 LAB — URINALYSIS, ROUTINE W REFLEX MICROSCOPIC
Bacteria, UA: NONE SEEN
Bilirubin Urine: NEGATIVE
Glucose, UA: NEGATIVE mg/dL
Ketones, ur: NEGATIVE mg/dL
Nitrite: NEGATIVE
Protein, ur: NEGATIVE mg/dL
RBC / HPF: >50 RBC/hpf (ref 0–5)
Specific Gravity, Urine: 1.016 (ref 1.005–1.030)
pH: 6 (ref 5.0–8.0)

## 2023-07-18 LAB — HEPATIC FUNCTION PANEL
ALT: 37 U/L (ref 0–44)
AST: 35 U/L (ref 15–41)
Albumin: 3.6 g/dL (ref 3.5–5.0)
Alkaline Phosphatase: 50 U/L (ref 38–126)
Bilirubin, Direct: 0.2 mg/dL (ref 0.0–0.2)
Indirect Bilirubin: 0.6 mg/dL (ref 0.3–0.9)
Total Bilirubin: 0.8 mg/dL (ref 0.3–1.2)
Total Protein: 6.6 g/dL (ref 6.5–8.1)

## 2023-07-18 LAB — BLOOD GAS, VENOUS
Acid-base deficit: 3.1 mmol/L — ABNORMAL HIGH (ref 0.0–2.0)
Bicarbonate: 21.2 mmol/L (ref 20.0–28.0)
O2 Saturation: 75.1 %
Patient temperature: 37
pCO2, Ven: 35 mmHg — ABNORMAL LOW (ref 44–60)
pH, Ven: 7.39 (ref 7.25–7.43)
pO2, Ven: 43 mmHg (ref 32–45)

## 2023-07-18 LAB — TROPONIN I (HIGH SENSITIVITY)
Troponin I (High Sensitivity): 9 ng/L (ref <18)
Troponin I (High Sensitivity): 9 ng/L (ref <18)

## 2023-07-18 LAB — MRSA NEXT GEN BY PCR, NASAL: MRSA by PCR Next Gen: NOT DETECTED

## 2023-07-18 LAB — LACTIC ACID, PLASMA
Lactic Acid, Venous: 4.9 mmol/L (ref 0.5–1.9)
Lactic Acid, Venous: 4.5 mmol/L (ref 0.5–1.9)

## 2023-07-18 LAB — SARS CORONAVIRUS 2 BY RT PCR: SARS Coronavirus 2 by RT PCR: NEGATIVE

## 2023-07-18 LAB — HEMOGLOBIN AND HEMATOCRIT, BLOOD
HCT: 37.1 % (ref 36.0–46.0)
Hemoglobin: 12.7 g/dL (ref 12.0–15.0)

## 2023-07-18 SURGERY — HYSTERECTOMY, VAGINAL, LAPAROSCOPY-ASSISTED, WITH SALPINGECTOMY
Anesthesia: General | Site: Vagina | Laterality: Right

## 2023-07-18 MED ORDER — IPRATROPIUM-ALBUTEROL 0.5-2.5 (3) MG/3ML IN SOLN
RESPIRATORY_TRACT | Status: AC
  Filled 2023-07-18: qty 3

## 2023-07-18 MED ORDER — FUROSEMIDE 10 MG/ML IJ SOLN
INTRAMUSCULAR | Status: DC | PRN
  Administered 2023-07-18: 10 mg via INTRAMUSCULAR

## 2023-07-18 MED ORDER — FAMOTIDINE 20 MG PO TABS
ORAL_TABLET | ORAL | Status: AC
  Filled 2023-07-18: qty 1

## 2023-07-18 MED ORDER — 0.9 % SODIUM CHLORIDE (POUR BTL) OPTIME
TOPICAL | Status: DC | PRN
  Administered 2023-07-18: 1000 mL

## 2023-07-18 MED ORDER — GLYCOPYRROLATE 0.2 MG/ML IJ SOLN
INTRAMUSCULAR | Status: DC | PRN
  Administered 2023-07-18: .2 mg via INTRAVENOUS

## 2023-07-18 MED ORDER — VANCOMYCIN HCL 1750 MG/350ML IV SOLN
1750.0000 mg | Freq: Once | INTRAVENOUS | Status: AC
  Administered 2023-07-19: 1750 mg via INTRAVENOUS
  Filled 2023-07-18: qty 350

## 2023-07-18 MED ORDER — LIDOCAINE HCL (PF) 2 % IJ SOLN
INTRAMUSCULAR | Status: AC
  Filled 2023-07-18: qty 5

## 2023-07-18 MED ORDER — HYDROMORPHONE HCL 1 MG/ML IJ SOLN
INTRAMUSCULAR | Status: AC
  Filled 2023-07-18: qty 1

## 2023-07-18 MED ORDER — ROCURONIUM BROMIDE 10 MG/ML (PF) SYRINGE
PREFILLED_SYRINGE | INTRAVENOUS | Status: AC
  Filled 2023-07-18: qty 20

## 2023-07-18 MED ORDER — ALBUTEROL SULFATE HFA 108 (90 BASE) MCG/ACT IN AERS
INHALATION_SPRAY | RESPIRATORY_TRACT | Status: DC | PRN
Start: 2023-07-18 — End: 2023-07-18
  Administered 2023-07-18 (×3): 2 via RESPIRATORY_TRACT

## 2023-07-18 MED ORDER — ACETAMINOPHEN 325 MG PO TABS
650.0000 mg | ORAL_TABLET | ORAL | Status: DC | PRN
  Administered 2023-07-20: 650 mg via ORAL
  Filled 2023-07-18: qty 2

## 2023-07-18 MED ORDER — KETAMINE HCL 10 MG/ML IJ SOLN
INTRAMUSCULAR | Status: DC | PRN
Start: 2023-07-18 — End: 2023-07-18
  Administered 2023-07-18: 20 mg via INTRAVENOUS

## 2023-07-18 MED ORDER — KETOROLAC TROMETHAMINE 30 MG/ML IJ SOLN
INTRAMUSCULAR | Status: AC
  Filled 2023-07-18: qty 1

## 2023-07-18 MED ORDER — LACTATED RINGERS IV SOLN: INTRAVENOUS | Status: DC

## 2023-07-18 MED ORDER — SUGAMMADEX SODIUM 200 MG/2ML IV SOLN
INTRAVENOUS | Status: DC | PRN
  Administered 2023-07-18: 400 mg via INTRAVENOUS

## 2023-07-18 MED ORDER — HEMOSTATIC AGENTS (NO CHARGE) OPTIME
TOPICAL | Status: DC | PRN
  Administered 2023-07-18: 1 via TOPICAL

## 2023-07-18 MED ORDER — KETOROLAC TROMETHAMINE 30 MG/ML IJ SOLN
30.0000 mg | Freq: Three times a day (TID) | INTRAMUSCULAR | Status: DC | PRN
  Administered 2023-07-18: 30 mg via INTRAVENOUS

## 2023-07-18 MED ORDER — ESMOLOL HCL 100 MG/10ML IV SOLN
INTRAVENOUS | Status: AC
  Filled 2023-07-18: qty 10

## 2023-07-18 MED ORDER — ONDANSETRON 4 MG PO TBDP: 4.0000 mg | ORAL_TABLET | Freq: Four times a day (QID) | ORAL | Status: DC | PRN

## 2023-07-18 MED ORDER — PHENYLEPHRINE HCL (PRESSORS) 10 MG/ML IV SOLN
INTRAVENOUS | Status: DC | PRN
  Administered 2023-07-18: 80 ug via INTRAVENOUS
  Administered 2023-07-18 (×2): 160 ug via INTRAVENOUS
  Administered 2023-07-18: 80 ug via INTRAVENOUS
  Administered 2023-07-18 (×2): 160 ug via INTRAVENOUS
  Administered 2023-07-18: 240 ug via INTRAVENOUS
  Administered 2023-07-18 (×3): 160 ug via INTRAVENOUS

## 2023-07-18 MED ORDER — KETAMINE HCL 50 MG/5ML IJ SOSY
PREFILLED_SYRINGE | INTRAMUSCULAR | Status: AC
  Filled 2023-07-18: qty 5

## 2023-07-18 MED ORDER — ROCURONIUM BROMIDE 100 MG/10ML IV SOLN
INTRAVENOUS | Status: DC | PRN
  Administered 2023-07-18: 50 mg via INTRAVENOUS
  Administered 2023-07-18: 10 mg via INTRAVENOUS
  Administered 2023-07-18 (×2): 20 mg via INTRAVENOUS

## 2023-07-18 MED ORDER — PROPOFOL 10 MG/ML IV BOLUS
INTRAVENOUS | Status: AC
  Filled 2023-07-18: qty 20

## 2023-07-18 MED ORDER — OXYCODONE HCL 5 MG/5ML PO SOLN: 5.0000 mg | Freq: Once | ORAL | Status: AC | PRN

## 2023-07-18 MED ORDER — IPRATROPIUM-ALBUTEROL 0.5-2.5 (3) MG/3ML IN SOLN
3.0000 mL | Freq: Once | RESPIRATORY_TRACT | Status: AC
  Administered 2023-07-18: 3 mL via RESPIRATORY_TRACT

## 2023-07-18 MED ORDER — OXYCODONE-ACETAMINOPHEN 5-325 MG PO TABS: 1.0000 | ORAL_TABLET | Freq: Four times a day (QID) | ORAL | Status: DC | PRN

## 2023-07-18 MED ORDER — LACTATED RINGERS IV BOLUS
2220.0000 mL | Freq: Once | INTRAVENOUS | Status: AC
  Administered 2023-07-18: 2200 mL via INTRAVENOUS

## 2023-07-18 MED ORDER — SEVOFLURANE IN SOLN
RESPIRATORY_TRACT | Status: AC
  Filled 2023-07-18: qty 500

## 2023-07-18 MED ORDER — LIDOCAINE-EPINEPHRINE 1 %-1:100000 IJ SOLN
INTRAMUSCULAR | Status: AC
  Filled 2023-07-18: qty 1

## 2023-07-18 MED ORDER — OXYCODONE HCL 5 MG PO TABS
ORAL_TABLET | ORAL | Status: AC
  Filled 2023-07-18: qty 1

## 2023-07-18 MED ORDER — HYDROMORPHONE HCL 1 MG/ML IJ SOLN
0.2500 mg | INTRAMUSCULAR | Status: DC | PRN
  Administered 2023-07-18 (×4): 0.5 mg via INTRAVENOUS

## 2023-07-18 MED ORDER — FLUORESCEIN SODIUM 10 % IV SOLN
INTRAVENOUS | Status: AC
  Filled 2023-07-18: qty 5

## 2023-07-18 MED ORDER — LIDOCAINE HCL (CARDIAC) PF 100 MG/5ML IV SOSY
PREFILLED_SYRINGE | INTRAVENOUS | Status: DC | PRN
  Administered 2023-07-18: 100 mg via INTRAVENOUS

## 2023-07-18 MED ORDER — GLYCOPYRROLATE 0.2 MG/ML IJ SOLN
INTRAMUSCULAR | Status: AC
  Filled 2023-07-18: qty 1

## 2023-07-18 MED ORDER — PROPOFOL 10 MG/ML IV BOLUS
INTRAVENOUS | Status: DC | PRN
  Administered 2023-07-18: 200 mg via INTRAVENOUS

## 2023-07-18 MED ORDER — PHENYLEPHRINE 80 MCG/ML (10ML) SYRINGE FOR IV PUSH (FOR BLOOD PRESSURE SUPPORT)
PREFILLED_SYRINGE | INTRAVENOUS | Status: AC
  Filled 2023-07-18: qty 20

## 2023-07-18 MED ORDER — BUPIVACAINE HCL (PF) 0.5 % IJ SOLN
INTRAMUSCULAR | Status: AC
  Filled 2023-07-18: qty 30

## 2023-07-18 MED ORDER — MIDAZOLAM HCL 2 MG/2ML IJ SOLN
INTRAMUSCULAR | Status: DC | PRN
  Administered 2023-07-18: 2 mg via INTRAVENOUS

## 2023-07-18 MED ORDER — CHLORHEXIDINE GLUCONATE 0.12 % MT SOLN
OROMUCOSAL | Status: AC
  Filled 2023-07-18: qty 15

## 2023-07-18 MED ORDER — OXYCODONE HCL 5 MG PO TABS
5.0000 mg | ORAL_TABLET | Freq: Once | ORAL | Status: AC | PRN
  Administered 2023-07-18: 5 mg via ORAL

## 2023-07-18 MED ORDER — PHENYLEPHRINE HCL-NACL 20-0.9 MG/250ML-% IV SOLN
INTRAVENOUS | Status: DC | PRN
Start: 2023-07-18 — End: 2023-07-18
  Administered 2023-07-18: 25 ug/min via INTRAVENOUS

## 2023-07-18 MED ORDER — PIPERACILLIN-TAZOBACTAM 3.375 G IVPB
3.3750 g | Freq: Three times a day (TID) | INTRAVENOUS | Status: DC
  Administered 2023-07-19 (×2): 3.375 g via INTRAVENOUS
  Filled 2023-07-18 (×2): qty 50

## 2023-07-18 MED ORDER — LIDOCAINE-EPINEPHRINE 1 %-1:100000 IJ SOLN
INTRAMUSCULAR | Status: DC | PRN
  Administered 2023-07-18: 10 mL

## 2023-07-18 MED ORDER — SODIUM CHLORIDE 0.9 % IV SOLN
INTRAVENOUS | Status: DC | PRN
Start: 2023-07-18 — End: 2023-07-18

## 2023-07-18 MED ORDER — NITROGLYCERIN 0.4 MG SL SUBL: 0.4000 mg | SUBLINGUAL_TABLET | SUBLINGUAL | Status: DC | PRN

## 2023-07-18 MED ORDER — OXYCODONE-ACETAMINOPHEN 5-325 MG PO TABS
ORAL_TABLET | ORAL | Status: AC
  Filled 2023-07-18: qty 1

## 2023-07-18 MED ORDER — EPHEDRINE SULFATE (PRESSORS) 50 MG/ML IJ SOLN
INTRAMUSCULAR | Status: DC | PRN
  Administered 2023-07-18 (×5): 5 mg via INTRAVENOUS

## 2023-07-18 MED ORDER — ONDANSETRON HCL 4 MG/2ML IJ SOLN: 4.0000 mg | Freq: Four times a day (QID) | INTRAMUSCULAR | Status: DC | PRN

## 2023-07-18 MED ORDER — PHENYLEPHRINE 80 MCG/ML (10ML) SYRINGE FOR IV PUSH (FOR BLOOD PRESSURE SUPPORT)
PREFILLED_SYRINGE | INTRAVENOUS | Status: AC
  Filled 2023-07-18: qty 10

## 2023-07-18 MED ORDER — LIDOCAINE HCL (PF) 2 % IJ SOLN
INTRAMUSCULAR | Status: AC
  Filled 2023-07-18: qty 15

## 2023-07-18 MED ORDER — METOPROLOL TARTRATE 5 MG/5ML IV SOLN
INTRAVENOUS | Status: AC
  Filled 2023-07-18: qty 5

## 2023-07-18 MED ORDER — PHENYLEPHRINE HCL-NACL 20-0.9 MG/250ML-% IV SOLN
INTRAVENOUS | Status: AC
  Filled 2023-07-18: qty 250

## 2023-07-18 MED ORDER — OXYCODONE-ACETAMINOPHEN 5-325 MG PO TABS
1.0000 | ORAL_TABLET | ORAL | Status: DC | PRN
  Administered 2023-07-18: 1 via ORAL

## 2023-07-18 MED ORDER — FENTANYL CITRATE (PF) 100 MCG/2ML IJ SOLN
INTRAMUSCULAR | Status: AC
  Filled 2023-07-18: qty 2

## 2023-07-18 MED ORDER — DEXMEDETOMIDINE HCL IN NACL 80 MCG/20ML IV SOLN
INTRAVENOUS | Status: AC
  Filled 2023-07-18: qty 20

## 2023-07-18 MED ORDER — GABAPENTIN 300 MG PO CAPS
ORAL_CAPSULE | ORAL | Status: AC
  Filled 2023-07-18: qty 1

## 2023-07-18 MED ORDER — VASOPRESSIN 20 UNIT/ML IV SOLN
INTRAVENOUS | Status: DC | PRN
Start: 2023-07-18 — End: 2023-07-18
  Administered 2023-07-18: 1 [IU] via INTRAVENOUS

## 2023-07-18 MED ORDER — PROPOFOL 10 MG/ML IV BOLUS
INTRAVENOUS | Status: AC
  Filled 2023-07-18: qty 40

## 2023-07-18 MED ORDER — IOHEXOL 350 MG/ML SOLN
75.0000 mL | Freq: Once | INTRAVENOUS | Status: AC | PRN
  Administered 2023-07-18: 60 mL via INTRAVENOUS

## 2023-07-18 MED ORDER — VANCOMYCIN HCL 1500 MG/300ML IV SOLN: 1500.0000 mg | INTRAVENOUS | Status: DC

## 2023-07-18 MED ORDER — DIPHENHYDRAMINE HCL 50 MG/ML IJ SOLN
INTRAMUSCULAR | Status: AC
  Filled 2023-07-18: qty 1

## 2023-07-18 MED ORDER — HYDROMORPHONE HCL 1 MG/ML IJ SOLN
INTRAMUSCULAR | Status: DC | PRN
Start: 2023-07-18 — End: 2023-07-18
  Administered 2023-07-18: 1 mg via INTRAVENOUS

## 2023-07-18 MED ORDER — ROCURONIUM BROMIDE 10 MG/ML (PF) SYRINGE
PREFILLED_SYRINGE | INTRAVENOUS | Status: AC
  Filled 2023-07-18: qty 10

## 2023-07-18 MED ORDER — SODIUM CHLORIDE 0.9 % IR SOLN
Status: DC | PRN
Start: 2023-07-18 — End: 2023-07-18
  Administered 2023-07-18: 1000 mL

## 2023-07-18 MED ORDER — CEFAZOLIN SODIUM-DEXTROSE 2-4 GM/100ML-% IV SOLN
INTRAVENOUS | Status: AC
  Filled 2023-07-18: qty 100

## 2023-07-18 MED ORDER — ACETAMINOPHEN 500 MG PO TABS
ORAL_TABLET | ORAL | Status: AC
  Filled 2023-07-18: qty 2

## 2023-07-18 MED ORDER — FLUORESCEIN SODIUM 10 % IV SOLN
INTRAVENOUS | Status: DC | PRN
  Administered 2023-07-18: 50 mg via INTRAVENOUS

## 2023-07-18 MED ORDER — ONDANSETRON HCL 4 MG/2ML IJ SOLN
INTRAMUSCULAR | Status: DC | PRN
  Administered 2023-07-18 (×2): 4 mg via INTRAVENOUS

## 2023-07-18 MED ORDER — DEXAMETHASONE SODIUM PHOSPHATE 10 MG/ML IJ SOLN
INTRAMUSCULAR | Status: DC | PRN
  Administered 2023-07-18: 10 mg via INTRAVENOUS

## 2023-07-18 MED ORDER — DEXMEDETOMIDINE HCL IN NACL 80 MCG/20ML IV SOLN
INTRAVENOUS | Status: DC | PRN
Start: 2023-07-18 — End: 2023-07-18
  Administered 2023-07-18 (×2): 12 ug via INTRAVENOUS

## 2023-07-18 MED ORDER — MIDAZOLAM HCL 2 MG/2ML IJ SOLN
INTRAMUSCULAR | Status: AC
  Filled 2023-07-18: qty 2

## 2023-07-18 MED ORDER — OXYCODONE-ACETAMINOPHEN 5-325 MG PO TABS
1.0000 | ORAL_TABLET | Freq: Four times a day (QID) | ORAL | Status: DC | PRN
  Administered 2023-07-18: 2 via ORAL
  Administered 2023-07-19: 1 via ORAL
  Administered 2023-07-19: 2 via ORAL
  Filled 2023-07-18 (×3): qty 2

## 2023-07-18 MED ORDER — BUPIVACAINE HCL 0.5 % IJ SOLN
INTRAMUSCULAR | Status: DC | PRN
  Administered 2023-07-18: 8 mL

## 2023-07-18 MED ORDER — FENTANYL CITRATE (PF) 100 MCG/2ML IJ SOLN
INTRAMUSCULAR | Status: DC | PRN
  Administered 2023-07-18 (×2): 50 ug via INTRAVENOUS

## 2023-07-18 SURGICAL SUPPLY — 65 items
APL PRP STRL LF DISP 70% ISPRP (MISCELLANEOUS) ×1
APL SRG 38 LTWT LNG FL B (MISCELLANEOUS)
APPLICATOR ARISTA FLEXITIP XL (MISCELLANEOUS) IMPLANT
BAG DRN RND TRDRP ANRFLXCHMBR (UROLOGICAL SUPPLIES) ×1
BAG URINE DRAIN 2000ML AR STRL (UROLOGICAL SUPPLIES) ×2 IMPLANT
BLADE SURG SZ11 CARB STEEL (BLADE) ×2 IMPLANT
CATH FOLEY 2WAY 5CC 16FR (CATHETERS) ×1
CATH ROBINSON RED A/P 16FR (CATHETERS) ×2 IMPLANT
CATH URTH 16FR FL 2W BLN LF (CATHETERS) ×2 IMPLANT
CHLORAPREP W/TINT 26 (MISCELLANEOUS) ×2 IMPLANT
DRAPE SURG 17X11 SM STRL (DRAPES) ×2 IMPLANT
DRSG TEGADERM 2-3/8X2-3/4 SM (GAUZE/BANDAGES/DRESSINGS) ×8 IMPLANT
ELECT REM PT RETURN 9FT ADLT (ELECTROSURGICAL) ×1
ELECTRODE REM PT RTRN 9FT ADLT (ELECTROSURGICAL) ×2 IMPLANT
GAUZE 4X4 16PLY ~~LOC~~+RFID DBL (SPONGE) ×6 IMPLANT
GAUZE SPONGE 2X2 STRL 8-PLY (GAUZE/BANDAGES/DRESSINGS) ×6 IMPLANT
GLOVE SURG SYN 8.0 (GLOVE) ×3 IMPLANT
GLOVE SURG SYN 8.0 PF PI (GLOVE) ×6 IMPLANT
GOWN STRL REUS W/ TWL LRG LVL3 (GOWN DISPOSABLE) ×4 IMPLANT
GOWN STRL REUS W/ TWL XL LVL3 (GOWN DISPOSABLE) ×6 IMPLANT
GOWN STRL REUS W/TWL LRG LVL3 (GOWN DISPOSABLE) ×2
GOWN STRL REUS W/TWL XL LVL3 (GOWN DISPOSABLE) ×3
GRASPER SUT TROCAR 14GX15 (MISCELLANEOUS) IMPLANT
HEMOSTAT ARISTA ABSORB 3G PWDR (HEMOSTASIS) IMPLANT
IRRIGATION STRYKERFLOW (MISCELLANEOUS) ×2 IMPLANT
IRRIGATOR STRYKERFLOW (MISCELLANEOUS) ×1
IV LACTATED RINGERS 1000ML (IV SOLUTION) ×2 IMPLANT
IV NS 1000ML (IV SOLUTION) ×1
IV NS 1000ML BAXH (IV SOLUTION) IMPLANT
KIT PINK PAD W/HEAD ARE REST (MISCELLANEOUS) ×1
KIT PINK PAD W/HEAD ARM REST (MISCELLANEOUS) ×2 IMPLANT
KIT TURNOVER CYSTO (KITS) ×2 IMPLANT
LABEL OR SOLS (LABEL) ×2 IMPLANT
MANIFOLD NEPTUNE II (INSTRUMENTS) ×2 IMPLANT
NDL HYPO 22X1.5 SAFETY MO (MISCELLANEOUS) ×2 IMPLANT
NEEDLE HYPO 22X1.5 SAFETY MO (MISCELLANEOUS) ×1 IMPLANT
NS IRRIG 1000ML POUR BTL (IV SOLUTION) IMPLANT
PACK BASIN MINOR ARMC (MISCELLANEOUS) ×2 IMPLANT
PACK GYN LAPAROSCOPIC (MISCELLANEOUS) ×2 IMPLANT
PAD OB MATERNITY 4.3X12.25 (PERSONAL CARE ITEMS) ×2 IMPLANT
SCRUB CHG 4% DYNA-HEX 4OZ (MISCELLANEOUS) ×2 IMPLANT
SET CYSTO W/LG BORE CLAMP LF (SET/KITS/TRAYS/PACK) IMPLANT
SHEARS HARMONIC ACE PLUS 36CM (ENDOMECHANICALS) IMPLANT
SLEEVE Z-THREAD 5X100MM (TROCAR) ×4 IMPLANT
SOL PREP PVP 2OZ (MISCELLANEOUS) ×3
SOLUTION PREP PVP 2OZ (MISCELLANEOUS) ×4 IMPLANT
STRIP CLOSURE SKIN 1/2X4 (GAUZE/BANDAGES/DRESSINGS) ×2 IMPLANT
SUT PDS 2-0 27IN (SUTURE) IMPLANT
SUT VIC AB 0 CT1 27 (SUTURE) ×2
SUT VIC AB 0 CT1 27XCR 8 STRN (SUTURE) ×4 IMPLANT
SUT VIC AB 0 CT1 36 (SUTURE) ×2 IMPLANT
SUT VIC AB 0 CT2 27 (SUTURE) IMPLANT
SUT VIC AB 2-0 SH 27 (SUTURE) ×2
SUT VIC AB 2-0 SH 27XBRD (SUTURE) IMPLANT
SUT VIC AB 2-0 UR6 27 (SUTURE) IMPLANT
SUT VIC AB 3-0 SH 27 (SUTURE)
SUT VIC AB 3-0 SH 27X BRD (SUTURE) IMPLANT
SUT VIC AB 4-0 SH 27 (SUTURE) ×2
SUT VIC AB 4-0 SH 27XANBCTRL (SUTURE) ×2 IMPLANT
SYR 10ML LL (SYRINGE) ×2 IMPLANT
SYR CONTROL 10ML LL (SYRINGE) ×2 IMPLANT
TRAP FLUID SMOKE EVACUATOR (MISCELLANEOUS) ×2 IMPLANT
TROCAR Z-THREAD FIOS 5X100MM (TROCAR) ×2 IMPLANT
TUBING EVAC SMOKE HEATED PNEUM (TUBING) ×2 IMPLANT
WATER STERILE IRR 500ML POUR (IV SOLUTION) ×2 IMPLANT

## 2023-07-18 NOTE — Discharge Instructions (Signed)
AMBULATORY SURGERY  DISCHARGE INSTRUCTIONS   The drugs that you were given will stay in your system until tomorrow so for the next 24 hours you should not:  Drive an automobile Make any legal decisions Drink any alcoholic beverage   You may resume regular meals tomorrow.  Today it is better to start with liquids and gradually work up to solid foods.  You may eat anything you prefer, but it is better to start with liquids, then soup and crackers, and gradually work up to solid foods.   Please notify your doctor immediately if you have any unusual bleeding, trouble breathing, redness and pain at the surgery site, drainage, fever, or pain not relieved by medication.     Your post-operative visit with Dr.                                       is: Date:                        Time:    Please call to schedule your post-operative visit.  Additional Instructions: 

## 2023-07-18 NOTE — Assessment & Plan Note (Deleted)
Patient states that she has been experiencing several week history of shortness of breath with a sensation of chest tightness when exerting herself that is now been present postoperatively.  Risk factors for cardiovascular disease include previous tobacco use (cessation approximately 6 weeks ago), obesity and hyperlipidemia with LDL of 150.  No history of hypertension or type 2 diabetes.  EKG with some nonspecific T wave inversions, which some were present 2 years ago and negative troponin. Briefly discussed with on call cardiology, overall, low risk for ACS.   - Consider formal cardiology consultation in the a.m. - Telemetry monitoring - Echocardiogram ordered - Continue to trend troponin - Nitroglycerin trial for chest pain as needed, as blood pressure allows - Hold off on heparin at this time

## 2023-07-18 NOTE — Progress Notes (Signed)
Pharmacy Antibiotic Note  [name] is a 48 y.o. female admitted on 07/18/2023 with pneumonia and sepsis.  Pharmacy has been consulted for Vanc, Zosyn dosing.  Plan: Zosyn 3.375 gm IV Q8H EI ordered to start on 9/12 @ ~ 2300.  Vancomycin 1750 mg IV X 1 loading dose ordered for 9/13 @ 0000. Vancomycin 1500 mg IV Q24H ordered to start on 9/14 @ 0000.  AUC = 471.7 Vanc trough = 9.7   Height: 5\' 3"  (160 cm) Weight: 74.4 kg (164 lb 0.4 oz) IBW/kg (Calculated) : 52.4  Temp (24hrs), Avg:97.8 F (36.6 C), Min:96.8 F (36 C), Max:98.4 F (36.9 C)  Recent Labs  Lab 07/15/23 0944 07/18/23 1639 07/18/23 1949 07/18/23 2217  WBC 9.3 13.4*  --   --   CREATININE 0.64 0.85  --   --   LATICACIDVEN  --   --  4.9* 4.5*    Estimated Creatinine Clearance: 77.4 mL/min (by C-G formula based on SCr of 0.85 mg/dL).    Allergies  Allergen Reactions   Sulfa Antibiotics Hives and Itching   Tramadol Other (See Comments)    seizures   Prochlorperazine Edisylate Anxiety    tachycardia    Antimicrobials this admission:   >>    >>   Dose adjustments this admission:   Microbiology results:  BCx:   UCx:    Sputum:    MRSA PCR:   Thank you for allowing pharmacy to be a part of this patient's care.  Elvia Aydin D 07/18/2023 11:22 PM

## 2023-07-18 NOTE — Progress Notes (Signed)
Since surgery ended 11 am pt has experienced O2 desat into 70-80". She is able to bring up to 90 low with deep inspiration . Currently on 3 l  .  Also c/o  chest pain . She has received 2 nebulizer tx.. CXr neg . I have ordered an EKG and troponin level and have consulted the hospitalist  will see her soon

## 2023-07-18 NOTE — Assessment & Plan Note (Deleted)
Likely reactive due to surgery earlier today.  - Repeat CBC in the a.m.

## 2023-07-18 NOTE — Assessment & Plan Note (Addendum)
Patient presented for scheduled laparoscopic vaginal hysterectomy with right salpingectomy on 9/12 This was performed without complication but she was SBO and hypoxic post-procedure Oxycodone as needed for pain control Management per OB/GYN

## 2023-07-18 NOTE — Transfer of Care (Signed)
Immediate Anesthesia Transfer of Care Note  Patient: [name]  Procedure(s) Performed: LAPAROSCOPIC ASSISTED VAGINAL HYSTERECTOMY WITH RIGHT SALPINGECTOMY (Right: Vagina )  Patient Location: PACU  Anesthesia Type:General  Level of Consciousness: awake, drowsy, and patient cooperative  Airway & Oxygen Therapy: Patient Spontanous Breathing and Patient connected to face mask oxygen  Post-op Assessment: Report given to RN and Post -op Vital signs reviewed and stable  Post vital signs: Reviewed and stable  Last Vitals:  Vitals Value Taken Time  BP 103/76 07/18/23 1115  Temp    Pulse 102 07/18/23 1115  Resp 25 07/18/23 1115  SpO2 96 % 07/18/23 1115  Vitals shown include unfiled device data.  Last Pain:  Vitals:   07/18/23 0631  TempSrc: Oral  PainSc: 3          Complications: No notable events documented.

## 2023-07-18 NOTE — Anesthesia Postprocedure Evaluation (Addendum)
Anesthesia Post Note  Patient: [name]  Procedure(s) Performed: LAPAROSCOPIC ASSISTED VAGINAL HYSTERECTOMY WITH RIGHT SALPINGECTOMY (Right: Vagina )  Patient location during evaluation: PACU Anesthesia Type: General Level of consciousness: awake and alert Pain management: pain level controlled Vital Signs Assessment: post-procedure vital signs reviewed and stable Respiratory status: spontaneous breathing, nonlabored ventilation, respiratory function stable and patient connected to nasal cannula oxygen Cardiovascular status: blood pressure returned to baseline and stable Postop Assessment: no apparent nausea or vomiting Anesthetic complications: no Comments: Patient continues to require 4L O2 with SpO2 84-88%. Duoneb given without improvement. CXR ordered. Surgeon notified of potential need for overnight stay   No notable events documented.   Last Vitals:  Vitals:   07/18/23 1215 07/18/23 1230  BP: 103/84 106/75  Pulse: (!) 117 (!) 117  Resp:  14  Temp:    SpO2: (!) 88% (!) 77%    Last Pain:  Vitals:   07/18/23 1215  TempSrc:   PainSc: 6                  Louie Boston

## 2023-07-18 NOTE — Progress Notes (Signed)
Pt for LAVH and right salpingectomy . Labs reviewed . All questions answered . Proceed

## 2023-07-18 NOTE — Brief Op Note (Signed)
07/18/2023  11:00 AM  PATIENT:  [name]  47 y.o. female  PRE-OPERATIVE DIAGNOSIS:  menorrhagia, possible scar tissue  POST-OPERATIVE DIAGNOSIS:  menorrhagia,  PROCEDURE:  Procedure(s): LAPAROSCOPIC ASSISTED VAGINAL HYSTERECTOMY WITH RIGHT SALPINGECTOMY (Right) Cystoscopy   SURGEON:  Surgeons and Role:    * Aadit Hagood, Ihor Austin, MD - Primary    * Ellory Khurana, Festus Holts, MD - Assisting  PHYSICIAN ASSISTANT: cst  ASSISTANTS: none   ANESTHESIA:   general  EBL:  150 mL  IOF 1400 cc uo 200cc  BLOOD ADMINISTERED:none  DRAINS: none   LOCAL MEDICATIONS USED:  MARCAINE     SPECIMEN:  Source of Specimen:  cervix , uterus  right fallopian tube   DISPOSITION OF SPECIMEN:  PATHOLOGY  COUNTS:  YES  TOURNIQUET:  * No tourniquets in log *  DICTATION: .Other Dictation: Dictation Number verbal  PLAN OF CARE: Discharge to home after PACU  PATIENT DISPOSITION:  PACU - hemodynamically stable.   Delay start of Pharmacological VTE agent (>24hrs) due to surgical blood loss or risk of bleeding: not applicable

## 2023-07-18 NOTE — Addendum Note (Signed)
Addendum  created 07/18/23 1244 by Louie Boston, MD   Clinical Note Signed

## 2023-07-18 NOTE — Anesthesia Preprocedure Evaluation (Signed)
Anesthesia Evaluation  Patient identified by MRN, date of birth, ID band Patient awake    Reviewed: Allergy & Precautions, NPO status , Patient's Chart, lab work & pertinent test results  History of Anesthesia Complications Negative for: history of anesthetic complications (denies PONV)  Airway Mallampati: I  TM Distance: >3 FB Neck ROM: full    Dental no notable dental hx.    Pulmonary neg pulmonary ROS, former smoker   Pulmonary exam normal        Cardiovascular negative cardio ROS Normal cardiovascular exam     Neuro/Psych  Headaches PSYCHIATRIC DISORDERS Anxiety      Neuromuscular disease    GI/Hepatic negative GI ROS, Neg liver ROS,,,  Endo/Other  negative endocrine ROS    Renal/GU      Musculoskeletal   Abdominal   Peds  Hematology negative hematology ROS (+)   Anesthesia Other Findings Past Medical History: No date: Abnormal thyroid function test No date: Anemia No date: Anxiety 06/2023: Bronchitis     Comment:  finished prednisone and currently taking Amoxicillin as               of 06-12-23 No date: Cervical lymphadenopathy No date: Chronic back pain No date: Ectopic pregnancy No date: Headache     Comment:  MIGRAINES No date: History of kidney stones No date: HSV infection No date: Lumbar radiculopathy No date: Menorrhagia No date: Palpitations No date: Pneumonia     Comment:  06/2023 No date: PONV (postoperative nausea and vomiting)     Comment:  DURING KIDNEY STONE REMOVAL 2014: Seizures (HCC)     Comment:  one occurence-due to Tramadol No date: UTI (urinary tract infection)  Past Surgical History: 10/22/2019: CARPAL TUNNEL RELEASE; Left     Comment:  Procedure: CARPAL TUNNEL RELEASE;  Surgeon: Kennedy Bucker, MD;  Location: [facility];  Service: Orthopedics;               Laterality: Left; No date: CYSTOSCOPY/RETROGRADE/URETEROSCOPY 11/01/2011: DILATION AND CURETTAGE OF  UTERUS     Comment:  Procedure: DILATATION AND CURETTAGE;  Surgeon: Zenaida Niece, MD;  Location: [facility];  Service: Gynecology;                Laterality: N/A; 12/31/2019: HARDWARE REMOVAL; Left     Comment:  Procedure: LEFT WRIST HARDWARE REMOVAL;  Surgeon: Kennedy Bucker, MD;  Location: [facility];  Service: Orthopedics;               Laterality: Left; 07/15/2015: LAPAROSCOPIC TUBAL LIGATION; Bilateral     Comment:  Procedure: LAPAROSCOPIC TUBAL LIGATION;  Surgeon: Nadara Mustard, MD;  Location: [facility];  Service: Gynecology;               Laterality: Bilateral; 11/01/2011: LAPAROSCOPY     Comment:  Procedure: LAPAROSCOPY OPERATIVE;  Surgeon: Zenaida Niece, MD;  Location: [facility];  Service: Gynecology;                Laterality: N/A; 2008: LAPAROSCOPY FOR ECTOPIC PREGNANCY     Comment:  mtx also No date: LITHOTRIPSY 10/22/2019: OPEN  REDUCTION INTERNAL FIXATION (ORIF) DISTAL RADIAL  FRACTURE; Left     Comment:  Procedure: OPEN REDUCTION INTERNAL FIXATION (ORIF)               DISTAL RADIAL FRACTURE;  Surgeon: Kennedy Bucker, MD;                Location: [facility];  Service: Orthopedics;  Laterality:               Left; No date: TUBAL LIGATION  BMI    Body Mass Index: 29.06 kg/m      Reproductive/Obstetrics negative OB ROS                             Anesthesia Physical Anesthesia Plan  ASA: 2  Anesthesia Plan: General ETT   Post-op Pain Management: Toradol IV (intra-op)*, Ketamine IV*, Tylenol PO (pre-op)* and Gabapentin PO (pre-op)*   Induction: Intravenous  PONV Risk Score and Plan: 3 and Ondansetron, Dexamethasone, Midazolam and Treatment may vary due to age or medical condition  Airway Management Planned: Oral ETT  Additional Equipment:   Intra-op Plan:   Post-operative Plan: Extubation in OR  Informed Consent: I have reviewed the patients History and Physical, chart, labs and  discussed the procedure including the risks, benefits and alternatives for the proposed anesthesia with the patient or authorized representative who has indicated his/her understanding and acceptance.     Dental Advisory Given  Plan Discussed with: Anesthesiologist, CRNA and Surgeon  Anesthesia Plan Comments: (Patient consented for risks of anesthesia including but not limited to:  - adverse reactions to medications - damage to eyes, teeth, lips or other oral mucosa - nerve damage due to positioning  - sore throat or hoarseness - Damage to heart, brain, nerves, lungs, other parts of body or loss of life  Patient voiced understanding.)        Anesthesia Quick Evaluation

## 2023-07-18 NOTE — Assessment & Plan Note (Addendum)
Likely in the setting of obesity

## 2023-07-18 NOTE — Assessment & Plan Note (Deleted)
Patient presented today for scheduled hysterectomy, complicated by postoperative hypoxia into the 70s and 80s.  Patient difficult to maintain above 90 despite 3-4 L.  Given additional tachycardia and chest heaviness, will begin workup with CTA.  - Telemetry monitoring - Continue supplemental oxygen to maintain oxygen saturation above 88% - Wean as tolerated - Discontinue bronchodilators given no history of asthma/COPD no wheezing on examination - CTA pending

## 2023-07-18 NOTE — H&P (Addendum)
History and Physical    Patient: [name] WNU:[id number] DOB: 1976/07/04 DOA: 07/18/2023 DOS: the patient was seen and examined on 07/18/2023 PCP: Larena Glassman, PA  Patient coming from: Home  Chief Complaint: SOB and chest pain  HPI: [name] is a 47 y.o. female with medical history significant of migraines, menorrhagia who presents to the hospital for scheduled hysterectomy, with consultation placed due to shortness of breath and chest pain.  [name] states that she had been experiencing some shortness of breath and subsequent development of chest tightness that occurs with exertion but she had assumed this is due to weight gain.  Then today after her surgery, she noted that she had persistent shortness of breath with some chest tightness.  She does not have tightness anywhere else.  She states that chest tightness is never occurred independent of shortness of breath, as shortness of breath always occurs first.  She endorses palpitations.  She denies any history of orthopnea, lower extremity swelling.  She denies any known cardiac history, hypertension, hyperlipidemia or diabetes.  She notes that she quit vaping approximately 6 weeks ago.  She denies any known history of asthma or COPD  Hospital course: On arrival to the hospital, patient was normotensive at 119/90 with heart rate of 100.  She was saturating at 96% on room air.  She was afebrile at 97.5.  Postoperatively, patient was noted to be normotensive at 98/70 with heart rate of 116.  She was saturating at 84% and was placed on 4 L with gradual improvement up to 92%.  Chest x-ray was obtained with no evidence of active disease.  CBC and troponin were ordered.  [facility] contacted for admission.  Review of Systems: As mentioned in the history of present illness. All other systems reviewed and are negative.  Past Medical History:  Diagnosis Date   Abnormal thyroid function test    Anemia    Anxiety    Bronchitis 06/2023    finished prednisone and currently taking Amoxicillin as of 06-12-23   Cervical lymphadenopathy    Chronic back pain    Ectopic pregnancy    Headache    MIGRAINES   History of kidney stones    HSV infection    Lumbar radiculopathy    Menorrhagia    Palpitations    Pneumonia    06/2023   PONV (postoperative nausea and vomiting)    DURING KIDNEY STONE REMOVAL   Seizures (HCC) 2014   one occurence-due to Tramadol   UTI (urinary tract infection)    Past Surgical History:  Procedure Laterality Date   CARPAL TUNNEL RELEASE Left 10/22/2019   Procedure: CARPAL TUNNEL RELEASE;  Surgeon: Kennedy Bucker, MD;  Location: [facility];  Service: Orthopedics;  Laterality: Left;   CYSTOSCOPY/RETROGRADE/URETEROSCOPY     DILATION AND CURETTAGE OF UTERUS  11/01/2011   Procedure: DILATATION AND CURETTAGE;  Surgeon: Zenaida Niece, MD;  Location: [facility];  Service: Gynecology;  Laterality: N/A;   HARDWARE REMOVAL Left 12/31/2019   Procedure: LEFT WRIST HARDWARE REMOVAL;  Surgeon: Kennedy Bucker, MD;  Location: [facility];  Service: Orthopedics;  Laterality: Left;   LAPAROSCOPIC TUBAL LIGATION Bilateral 07/15/2015   Procedure: LAPAROSCOPIC TUBAL LIGATION;  Surgeon: Nadara Mustard, MD;  Location: [facility];  Service: Gynecology;  Laterality: Bilateral;   LAPAROSCOPY  11/01/2011   Procedure: LAPAROSCOPY OPERATIVE;  Surgeon: Zenaida Niece, MD;  Location: [facility];  Service: Gynecology;  Laterality: N/A;   LAPAROSCOPY FOR ECTOPIC PREGNANCY  2008  mtx also   LITHOTRIPSY     OPEN REDUCTION INTERNAL FIXATION (ORIF) DISTAL RADIAL FRACTURE Left 10/22/2019   Procedure: OPEN REDUCTION INTERNAL FIXATION (ORIF) DISTAL RADIAL FRACTURE;  Surgeon: Kennedy Bucker, MD;  Location: [facility];  Service: Orthopedics;  Laterality: Left;   TUBAL LIGATION     Social History:  reports that she quit smoking about 3 years ago. Her smoking use included cigarettes. She has never used smokeless tobacco. She reports that she does not drink  alcohol and does not use drugs.  Allergies  Allergen Reactions   Sulfa Antibiotics Hives and Itching   Tramadol Other (See Comments)    seizures   Prochlorperazine Edisylate Anxiety    tachycardia    Family History  Problem Relation Age of Onset   Cancer Maternal Grandmother     Prior to Admission medications   Medication Sig Start Date End Date Taking? Authorizing Provider  albuterol (VENTOLIN HFA) 108 (90 Base) MCG/ACT inhaler Inhale 1-2 puffs into the lungs every 6 (six) hours as needed for wheezing or shortness of breath (was just given this for bronchitis).   Yes [provider]  benzonatate (TESSALON) 200 MG capsule Take 200 mg by mouth 3 (three) times daily.   Yes [provider]  MAGNESIUM CITRATE PO Take 1 tablet by mouth daily at 6 (six) AM. Patient not taking: Reported on 07/18/2023    [provider]    Physical Exam: Vitals:   07/18/23 1900 07/18/23 1915 07/18/23 1930 07/18/23 1945  BP: 112/73  112/83   Pulse: (!) 117 (!) 116 (!) 117 (!) 103  Resp: 19 18 19 18   Temp: 97.7 F (36.5 C)     TempSrc:      SpO2: 90% 91% 92% 91%  Weight:      Height:       Physical Exam Vitals and nursing note reviewed.  Constitutional:      General: She is not in acute distress.    Appearance: She is obese. She is not toxic-appearing.  HENT:     Head: Normocephalic and atraumatic.  Eyes:     Conjunctiva/sclera: Conjunctivae normal.     Pupils: Pupils are equal, round, and reactive to light.  Cardiovascular:     Rate and Rhythm: Regular rhythm. Tachycardia present.     Heart sounds: No murmur heard.    No gallop.  Pulmonary:     Effort: Pulmonary effort is normal. No respiratory distress.     Breath sounds: No wheezing, rhonchi or rales.  Abdominal:     General: There is distension (Postop).     Palpations: Abdomen is soft.     Tenderness: There is no abdominal tenderness. There is no guarding.  Musculoskeletal:     Right lower leg: No edema.      Left lower leg: No edema.  Skin:    General: Skin is warm and dry.  Neurological:     Mental Status: She is alert and oriented to person, place, and time. Mental status is at baseline.  Psychiatric:        Behavior: Behavior normal.    Data Reviewed: CBC with WBC of 13.4, hemoglobin of 12.8, platelets of 225 BMP still pending Troponin within normal limits at 9 Lipid panel with total cholesterol of 203 and LDL of 146. COVID-19 negative  EKG personally reviewed.  Sinus rhythm with rate of 106.  Borderline QT prolongation at 483 ms.  T wave inversion in leads III, V3-V6.  Otherwise, no ST or  T wave changes concerning for acute ischemia.  Compared to EKG obtained in April 2022.  Similar rate at that time.  T wave inversions were present in lead III, and were flat in V3-4.    DG Chest Port 1 View  Result Date: 07/18/2023 CLINICAL DATA:  200808 Hypoxia [id number] EXAM: PORTABLE CHEST 1 VIEW COMPARISON:  Chest x-ray 06/17/2023 FINDINGS: The heart and mediastinal contours are within normal limits. No focal consolidation. No pulmonary edema. No pleural effusion. No pneumothorax. No acute osseous abnormality. IMPRESSION: No active disease. Electronically Signed   By: Tish Frederickson M.D.   On: 07/18/2023 14:09    Results are pending, will review when available.  Assessment and Plan:  * Acute hypoxic respiratory failure (HCC) Patient presented today for scheduled hysterectomy, complicated by postoperative hypoxia into the 70s and 80s.  Patient difficult to maintain above 90 despite 3-4 L.  Given additional tachycardia and chest heaviness, will begin workup with CTA.  - Telemetry monitoring - Continue supplemental oxygen to maintain oxygen saturation above 88% - Wean as tolerated - Discontinue bronchodilators given no history of asthma/COPD no wheezing on examination - CTA pending  Chest pain Patient states that she has been experiencing several week history of shortness of breath with a  sensation of chest tightness when exerting herself that is now been present postoperatively.  Risk factors for cardiovascular disease include previous tobacco use (cessation approximately 6 weeks ago), obesity and hyperlipidemia with LDL of 150.  No history of hypertension or type 2 diabetes.  EKG with some nonspecific T wave inversions, which some were present 2 years ago and negative troponin. Briefly discussed with on call cardiology, overall, low risk for ACS.   - Consider formal cardiology consultation in the a.m. - Telemetry monitoring - Echocardiogram ordered - Continue to trend troponin - Nitroglycerin trial for chest pain as needed, as blood pressure allows - Hold off on heparin at this time  Hepatic steatosis Likely in the setting of obesity.  - Hepatic function panel and INR pending  S/P hysterectomy Patient is postop day #0 s/p laparoscopic vaginal hysterectomy with right salpingectomy.  - Oxycodone as needed for pain control - Management per OB/GYN  Leukocytosis Likely reactive due to surgery earlier today.  - Repeat CBC in the a.m.   Advance Care Planning:   Code Status: Full Code   Consults: None  Family Communication: No family at bedside  Severity of Illness: The appropriate patient status for this patient is OBSERVATION. Observation status is judged to be reasonable and necessary in order to provide the required intensity of service to ensure the patient's safety. The patient's presenting symptoms, physical exam findings, and initial radiographic and laboratory data in the context of their medical condition is felt to place them at decreased risk for further clinical deterioration. Furthermore, it is anticipated that the patient will be medically stable for discharge from the hospital within 2 midnights of admission.   Author: Verdene Lennert, MD 07/18/2023 7:48 PM  For on call review [email or web address].

## 2023-07-18 NOTE — Op Note (Signed)
[name], [name]. MEDICAL RECORD NO: [id number] ACCOUNT NO: [id number] DATE OF BIRTH: Feb 17, 1976 FACILITY: [facility] LOCATION: [facility] PHYSICIAN: Suzy Bouchard, MD  Operative Report   DATE OF PROCEDURE: 07/18/2023  PREOPERATIVE DIAGNOSIS: Menorrhagia, unresponsive to conservative therapy.  POSTOPERATIVE DIAGNOSIS:   Menorrhagia, unresponsive to conservative therapy.  PROCEDURE:   1.  Laparoscopic-assisted vaginal hysterectomy. 2.  Right salpingectomy. 3.  Cystoscopy.  ANESTHESIA:  General endotracheal anesthesia.  SURGEON:  Suzy Bouchard, MD  FIRST ASSISTANT:  Erma Pinto Lynden Flemmer, MD  INDICATIONS:  A 47 year old gravida 3, para 2, patient with a long history of menorrhagia, unresponsive to conservative therapy.  The patient has elected for definitive therapy.  The patient is status post a left salpingectomy.  DESCRIPTION OF PROCEDURE:  After adequate general endotracheal anesthesia, the patient was placed in dorsal supine position with the legs in the Crystal stirrups.  The patient's abdomen, perineum and vagina were prepped and draped in normal sterile  fashion.  Timeout was performed.  The patient did receive 2 grams of IV Ancef prior to commencement for surgical prophylaxis.  A weighted speculum was placed in the vagina and the cervix was grasped with a single tooth tenaculum and a uterine sound was  placed into the uterus to be used for uterine manipulation and 2 instruments were tethered together with Steri-Strips.  A Foley catheter was placed for the laparoscopic portion of the procedure.  Gloves and gown were changed.  Attention was directed to  the patient's abdomen where a 5 mm infraumbilical incision was made after injecting with 0.5% Marcaine.  The 5 mm laparoscope was advanced into the abdominal cavity under direct visualization.  Second port placement was placed left lower quadrant, 3 cm  medial to the left anterior iliac spine, a 5 mm trocar was  advanced under direct visualization.  Likewise, a 5 mm trocar was advanced on the right lower abdomen, 3 cm medial to the right anterior iliac spine under direct visualization.  Initial  impression revealed a slightly enlarged uterus, some omental adhesions to the right fallopian tube.  A Hulka clips were noted on the mid portion of the right fallopian tube.  Harmonic scalpel was brought up to the operative field and uterus was placed on  tension to the right side and the round ligament was cauterized and transected with Harmonic scalpel.  The uteroovarian ligament was cauterized and transected and the anterior leaf of the broad ligament was then opened to the central portion of the  lower uterine segment.  Left uterine artery was skeletonized, cauterized and transected with Harmonic scalpel.  Attention was directed to the patient's right where the right fallopian tube was grasped with the distal portion and the mesosalpinx was  dissected with Harmonic scalpel. The uteroovarian ligament was then clamped and transected followed by opening the anterior leaf of the broad ligament on the right.  The right uterine artery was skeletonized, cauterized, and transected.  Attention was  then directed vaginally.  A Foley catheter was removed and the single tooth tenaculum and uterine sound were removed.  Weighted speculum was placed in the vagina.  The cervix was grasped with 2 thyroid tenaculums and the bladder was elevated with a  Deaver retractor.  The cervix was circumferentially injected with 1% lidocaine with 1:100,000 epinephrine.  A direct posterior colpotomy incision was made.  Upon entering the posterior cul-de-sac, the posterior peritoneum was tagged to the vaginal cuff  and a long weighted billed speculum was placed.  Uterosacral ligaments were then  bilaterally clamped, transected and tagged for later identification.  The anterior cervix was circumferentially incised with the Bovie.  Anterior cul-de-sac was  entered  sharply and the bladder was reflected anteriorly with a Deaver retractor.  Cardinal ligaments were then bilaterally clamped, transected and suture ligated with 0 Vicryl suture.  Additional clamping on both sides of the uterus and suturing allowed for  delivery of the cervix, uterus and the right fallopian tube.  Good hemostasis was noted.  The vaginal cuff was closed with a running 0 Vicryl suture and the uterosacral ligaments were plicated centrally and the rest of the vaginal cuff was closed with  the 0 Vicryl suture.  Attention was then directed abdominally again laparoscopically.  There was noted to be some central oozing on inspection.  Surgeon then proceeded back vaginally and placed 3 separate figure-of-eight sutures in the vaginal cuff to  reinforce the vaginal cuff, which ultimately did control the bleeding.  Cystoscopy was performed given the dissection and the additional suturing.  The ostia were identified bilaterally with normal pluming of urine.  Bladder was drained and again the  laparoscope was used to reinsufflate the abdomen and abdomen appeared hemostatic.  Pressure was lowered to 7 mmHg.  Good hemostasis noted.  Arista was placed on the vaginal cuff and the procedure was terminated after deflation of the CO2 gas.  Incisions  were closed with interrupted 4-0 Vicryl suture.  Sterile dressing applied.  There were no complications.  ESTIMATED BLOOD LOSS:  150 mL.  URINE OUTPUT:  200 mL  INTRAOPERATIVE FLUIDS:  1400 mL.  The patient was taken to recovery room in good condition.   PUS D: 07/18/2023 11:54:03 am T: 07/18/2023 12:32:00 pm  JOB: [id number]/ [id number]

## 2023-07-18 NOTE — Anesthesia Procedure Notes (Addendum)
Procedure Name: Intubation Date/Time: 07/18/2023 7:43 AM  Performed by: Mohammed Kindle, CRNAPre-anesthesia Checklist: Patient identified, Emergency Drugs available, Suction available and Patient being monitored Patient Re-evaluated:Patient Re-evaluated prior to induction Oxygen Delivery Method: Circle system utilized Preoxygenation: Pre-oxygenation with 100% oxygen Induction Type: IV induction Ventilation: Mask ventilation without difficulty Laryngoscope Size: McGraph and 3 Grade View: Grade I Tube type: Oral Tube size: 7.0 mm Number of attempts: 1 Airway Equipment and Method: Stylet Placement Confirmation: ETT inserted through vocal cords under direct vision, positive ETCO2, breath sounds checked- equal and bilateral and CO2 detector Secured at: 21 cm Tube secured with: Tape Dental Injury: Teeth and Oropharynx as per pre-operative assessment  Comments: Inserted by Hassel Neth

## 2023-07-19 ENCOUNTER — Observation Stay (HOSPITAL_COMMUNITY)
Admission: RE | Admit: 2023-07-19 | Discharge: 2023-07-19 | Disposition: A | Discharge status: Home / Self Care | Payer: Managed Care, Other (non HMO) | Source: Home / Self Care | Attending: Internal Medicine | Admitting: Internal Medicine

## 2023-07-19 ENCOUNTER — Encounter: Payer: Self-pay | Admitting: Obstetrics and Gynecology

## 2023-07-19 DIAGNOSIS — Z882 Allergy status to sulfonamides status: Secondary | ICD-10-CM | POA: Diagnosis not present

## 2023-07-19 DIAGNOSIS — Z90710 Acquired absence of both cervix and uterus: Secondary | ICD-10-CM | POA: Diagnosis not present

## 2023-07-19 DIAGNOSIS — R000 Tachycardia, unspecified: Secondary | ICD-10-CM

## 2023-07-19 DIAGNOSIS — R0789 Other chest pain: Secondary | ICD-10-CM | POA: Diagnosis present

## 2023-07-19 DIAGNOSIS — N920 Excessive and frequent menstruation with regular cycle: Secondary | ICD-10-CM | POA: Diagnosis present

## 2023-07-19 DIAGNOSIS — Z888 Allergy status to other drugs, medicaments and biological substances status: Secondary | ICD-10-CM | POA: Diagnosis not present

## 2023-07-19 DIAGNOSIS — Z9079 Acquired absence of other genital organ(s): Secondary | ICD-10-CM | POA: Diagnosis not present

## 2023-07-19 DIAGNOSIS — E785 Hyperlipidemia, unspecified: Secondary | ICD-10-CM | POA: Diagnosis present

## 2023-07-19 DIAGNOSIS — R110 Nausea: Secondary | ICD-10-CM | POA: Diagnosis not present

## 2023-07-19 DIAGNOSIS — Z1152 Encounter for screening for COVID-19: Secondary | ICD-10-CM | POA: Diagnosis not present

## 2023-07-19 DIAGNOSIS — I270 Primary pulmonary hypertension: Secondary | ICD-10-CM | POA: Diagnosis present

## 2023-07-19 DIAGNOSIS — I361 Nonrheumatic tricuspid (valve) insufficiency: Secondary | ICD-10-CM | POA: Diagnosis present

## 2023-07-19 DIAGNOSIS — I700 Atherosclerosis of aorta: Secondary | ICD-10-CM | POA: Diagnosis present

## 2023-07-19 DIAGNOSIS — F419 Anxiety disorder, unspecified: Secondary | ICD-10-CM | POA: Diagnosis present

## 2023-07-19 DIAGNOSIS — K760 Fatty (change of) liver, not elsewhere classified: Secondary | ICD-10-CM | POA: Diagnosis present

## 2023-07-19 DIAGNOSIS — J9601 Acute respiratory failure with hypoxia: Secondary | ICD-10-CM | POA: Diagnosis present

## 2023-07-19 DIAGNOSIS — I272 Pulmonary hypertension, unspecified: Secondary | ICD-10-CM | POA: Diagnosis not present

## 2023-07-19 DIAGNOSIS — R079 Chest pain, unspecified: Secondary | ICD-10-CM

## 2023-07-19 DIAGNOSIS — Z79899 Other long term (current) drug therapy: Secondary | ICD-10-CM | POA: Diagnosis not present

## 2023-07-19 DIAGNOSIS — E669 Obesity, unspecified: Secondary | ICD-10-CM | POA: Diagnosis present

## 2023-07-19 DIAGNOSIS — Z87442 Personal history of urinary calculi: Secondary | ICD-10-CM | POA: Diagnosis not present

## 2023-07-19 DIAGNOSIS — F17210 Nicotine dependence, cigarettes, uncomplicated: Secondary | ICD-10-CM | POA: Diagnosis present

## 2023-07-19 DIAGNOSIS — I2510 Atherosclerotic heart disease of native coronary artery without angina pectoris: Secondary | ICD-10-CM | POA: Diagnosis present

## 2023-07-19 DIAGNOSIS — I50810 Right heart failure, unspecified: Secondary | ICD-10-CM | POA: Diagnosis present

## 2023-07-19 DIAGNOSIS — Z6829 Body mass index (BMI) 29.0-29.9, adult: Secondary | ICD-10-CM | POA: Diagnosis not present

## 2023-07-19 LAB — CBC WITH DIFFERENTIAL/PLATELET
Abs Immature Granulocytes: 0.1 K/uL — ABNORMAL HIGH (ref 0.00–0.07)
Basophils Absolute: 0 K/uL (ref 0.0–0.1)
Basophils Relative: 0 %
Eosinophils Absolute: 0 K/uL (ref 0.0–0.5)
Eosinophils Relative: 0 %
HCT: 34.6 % — ABNORMAL LOW (ref 36.0–46.0)
Hemoglobin: 11.8 g/dL — ABNORMAL LOW (ref 12.0–15.0)
Immature Granulocytes: 1 %
Lymphocytes Relative: 11 %
Lymphs Abs: 1.3 K/uL (ref 0.7–4.0)
MCH: 30.7 pg (ref 26.0–34.0)
MCHC: 34.1 g/dL (ref 30.0–36.0)
MCV: 90.1 fL (ref 80.0–100.0)
Monocytes Absolute: 0.5 K/uL (ref 0.1–1.0)
Monocytes Relative: 4 %
Neutro Abs: 10.6 K/uL — ABNORMAL HIGH (ref 1.7–7.7)
Neutrophils Relative %: 84 %
Platelets: 193 K/uL (ref 150–400)
RBC: 3.84 MIL/uL — ABNORMAL LOW (ref 3.87–5.11)
RDW: 13.5 % (ref 11.5–15.5)
WBC: 12.5 K/uL — ABNORMAL HIGH (ref 4.0–10.5)
nRBC: 0 % (ref 0.0–0.2)

## 2023-07-19 LAB — RESPIRATORY PANEL BY PCR

## 2023-07-19 LAB — SURGICAL PATHOLOGY

## 2023-07-19 LAB — URINALYSIS, W/ REFLEX TO CULTURE (INFECTION SUSPECTED)
Bacteria, UA: NONE SEEN
Bilirubin Urine: NEGATIVE
Glucose, UA: NEGATIVE mg/dL
Ketones, ur: NEGATIVE mg/dL
Nitrite: NEGATIVE
Protein, ur: NEGATIVE mg/dL
Specific Gravity, Urine: 1.028 (ref 1.005–1.030)
pH: 5 (ref 5.0–8.0)

## 2023-07-19 LAB — ECHOCARDIOGRAM COMPLETE
AR max vel: 2.32 cm2
AV Area VTI: 1.97 cm2
AV Area mean vel: 2.15 cm2
AV Mean grad: 2 mmHg
AV Peak grad: 4.9 mmHg
Ao pk vel: 1.11 m/s
Area-P 1/2: 4.58 cm2
Height: 63 in
MV VTI: 2.05 cm2
S' Lateral: 2.6 cm
Weight: 2624.36 [oz_av]

## 2023-07-19 LAB — BRAIN NATRIURETIC PEPTIDE: B Natriuretic Peptide: 470.4 pg/mL — ABNORMAL HIGH (ref 0.0–100.0)

## 2023-07-19 LAB — BASIC METABOLIC PANEL
Anion gap: 11 (ref 5–15)
BUN: 8 mg/dL (ref 6–20)
CO2: 21 mmol/L — ABNORMAL LOW (ref 22–32)
Calcium: 8.2 mg/dL — ABNORMAL LOW (ref 8.9–10.3)
Chloride: 103 mmol/L (ref 98–111)
Creatinine, Ser: 0.75 mg/dL (ref 0.44–1.00)
GFR, Estimated: >60 mL/min (ref >60)
Glucose, Bld: 178 mg/dL — ABNORMAL HIGH (ref 70–99)
Potassium: 4.4 mmol/L (ref 3.5–5.1)
Sodium: 135 mmol/L (ref 135–145)

## 2023-07-19 LAB — LACTIC ACID, PLASMA
Lactic Acid, Venous: 3.5 mmol/L (ref 0.5–1.9)
Lactic Acid, Venous: 3.7 mmol/L (ref 0.5–1.9)

## 2023-07-19 LAB — HIV ANTIBODY (ROUTINE TESTING W REFLEX): HIV Screen 4th Generation wRfx: NONREACTIVE

## 2023-07-19 MED ORDER — DOCUSATE SODIUM 100 MG PO CAPS: 100.0000 mg | ORAL_CAPSULE | Freq: Two times a day (BID) | ORAL | Status: DC | PRN

## 2023-07-19 MED ORDER — POTASSIUM CHLORIDE CRYS ER 20 MEQ PO TBCR
20.0000 meq | EXTENDED_RELEASE_TABLET | Freq: Two times a day (BID) | ORAL | Status: DC
  Administered 2023-07-19 – 2023-07-23 (×8): 20 meq via ORAL
  Filled 2023-07-19 (×8): qty 1

## 2023-07-19 MED ORDER — METOPROLOL TARTRATE 25 MG PO TABS
12.5000 mg | ORAL_TABLET | Freq: Two times a day (BID) | ORAL | Status: DC
  Administered 2023-07-19 – 2023-07-22 (×7): 12.5 mg via ORAL
  Filled 2023-07-19 (×7): qty 1

## 2023-07-19 MED ORDER — ENOXAPARIN SODIUM 40 MG/0.4ML IJ SOSY
40.0000 mg | PREFILLED_SYRINGE | INTRAMUSCULAR | Status: DC
  Administered 2023-07-19 – 2023-07-21 (×3): 40 mg via SUBCUTANEOUS
  Filled 2023-07-19 (×3): qty 0.4

## 2023-07-19 MED ORDER — OXYCODONE-ACETAMINOPHEN 5-325 MG PO TABS
1.0000 | ORAL_TABLET | ORAL | Status: DC | PRN
  Administered 2023-07-19 – 2023-07-20 (×3): 2 via ORAL
  Filled 2023-07-19 (×3): qty 2

## 2023-07-19 MED ORDER — HYDROXYZINE HCL 10 MG PO TABS: 10.0000 mg | ORAL_TABLET | Freq: Three times a day (TID) | ORAL | Status: DC

## 2023-07-19 MED ORDER — HYDROXYZINE HCL 25 MG PO TABS
25.0000 mg | ORAL_TABLET | Freq: Three times a day (TID) | ORAL | Status: DC
  Administered 2023-07-19: 25 mg via ORAL
  Filled 2023-07-19: qty 1

## 2023-07-19 MED ORDER — POLYETHYLENE GLYCOL 3350 17 G PO PACK
17.0000 g | PACK | Freq: Every day | ORAL | Status: DC
  Administered 2023-07-19 – 2023-07-21 (×3): 17 g via ORAL
  Filled 2023-07-19 (×3): qty 1

## 2023-07-19 MED ORDER — FUROSEMIDE 10 MG/ML IJ SOLN
20.0000 mg | Freq: Every day | INTRAMUSCULAR | Status: DC
  Administered 2023-07-20 – 2023-07-22 (×3): 20 mg via INTRAVENOUS
  Filled 2023-07-19 (×3): qty 2

## 2023-07-19 MED ORDER — PHENAZOPYRIDINE HCL 200 MG PO TABS
200.0000 mg | ORAL_TABLET | Freq: Three times a day (TID) | ORAL | Status: DC
  Administered 2023-07-20 – 2023-07-21 (×4): 200 mg via ORAL
  Filled 2023-07-19 (×5): qty 1

## 2023-07-19 MED ORDER — ALPRAZOLAM 0.25 MG PO TABS
0.2500 mg | ORAL_TABLET | Freq: Two times a day (BID) | ORAL | Status: DC | PRN
  Administered 2023-07-19 – 2023-07-22 (×2): 0.25 mg via ORAL
  Filled 2023-07-19 (×2): qty 1

## 2023-07-19 MED ORDER — HYDROXYZINE HCL 25 MG PO TABS
25.0000 mg | ORAL_TABLET | Freq: Three times a day (TID) | ORAL | Status: AC
  Administered 2023-07-19 – 2023-07-21 (×6): 25 mg via ORAL
  Filled 2023-07-19 (×6): qty 1

## 2023-07-19 MED ORDER — CEPHALEXIN 500 MG PO CAPS
500.0000 mg | ORAL_CAPSULE | Freq: Two times a day (BID) | ORAL | Status: AC
  Administered 2023-07-19 – 2023-07-22 (×6): 500 mg via ORAL
  Filled 2023-07-19 (×7): qty 1

## 2023-07-19 MED ORDER — FUROSEMIDE 10 MG/ML IJ SOLN
40.0000 mg | Freq: Two times a day (BID) | INTRAMUSCULAR | Status: DC
  Administered 2023-07-19: 40 mg via INTRAVENOUS
  Filled 2023-07-19: qty 4

## 2023-07-19 MED ORDER — LEVALBUTEROL HCL 0.63 MG/3ML IN NEBU
0.6300 mg | INHALATION_SOLUTION | Freq: Four times a day (QID) | RESPIRATORY_TRACT | Status: DC | PRN
  Administered 2023-07-19: 0.63 mg via RESPIRATORY_TRACT
  Filled 2023-07-19: qty 3

## 2023-07-19 NOTE — Progress Notes (Signed)
CROSS COVER NOTE  NAME: [name] MRN: [id number] DOB : 1976-01-29    Concern as stated by nurse / staff   sign off from admitting physician patient who developed crushing chest pain [ost hyterectomy     Pertinent findings on chart review: Cta negative for acute findings Lactic elevated at 4.9. leukocytosis, elevated rep and hear rate, troponin normal and flat  Assessment and  Interventions   Assessment:    07/18/2023   11:54 PM 07/18/2023    9:04 PM 07/18/2023    8:20 PM  Vitals with BMI  Systolic 115 113 409  Diastolic 81 78 96  Pulse 104 106       Latest Ref Rng & Units 07/19/2023    1:03 AM 07/18/2023    9:31 PM 07/18/2023    4:39 PM  CBC  WBC 4.0 - 10.5 K/uL 12.5   13.4   Hemoglobin 12.0 - 15.0 g/dL 81.1  91.4  78.2   Hematocrit 36.0 - 46.0 % 34.6  37.1  38.3   Platelets 150 - 400 K/uL 193   225     VBG 7.39,35,21.2 Alert and oriented; no focal deficits. No overt shortness of breath but states she feels pressure heaviness with simple walk to the bathroom Plan:  Sepsis  blood an urine for culture Trend lactate NS bolus 30 ml/kg Start broad spectrum zosyn and vanc Respiratory viral panel PCR  Acute respiratory failure  Wean oxygen as tolerated  Wean oxygen as tolerated keeping sats 88% or above      Donnie Mesa NP [facility] Cross Cover 7pm-7am - check amion for availability Pager [phone]

## 2023-07-19 NOTE — Progress Notes (Signed)
Transition of Care [facility]) - Progression Note    Patient Details  Name: [name] MRN: [id number] Date of Birth: 1975-12-30  Transition of Care [facility]) CM/SW Contact  Truddie Hidden, RN Phone Number: 07/19/2023, 11:58 AM  Clinical Narrative:    TOC continuing to follow patient's progress throughout discharge planning.        Expected Discharge Plan and Services         Expected Discharge Date: 07/18/23                                     Social Determinants of Health (SDOH) Interventions SDOH Screenings   Transportation Needs: No Transportation Needs (04/06/2021)   Received from [facility]  Tobacco Use: Medium Risk (07/18/2023)    Readmission Risk Interventions     No data to display

## 2023-07-19 NOTE — Progress Notes (Signed)
Obstetric and Gynecology  Subjective  [name] is a 47 y.o. female G3P2012 who presented on 07/18/2023 for POD#1 from LAVH and right salpingectomy  and cystoscopy   .  Pt admitted by hospitalist with SOB , tachycardia and low O2 sats  From a post op perspective she is stable . No vaginal bleeding hematocrit stable   Work up show significant right heart disease . Pulm HTN    Objective   Vitals:   07/19/23 1243 07/19/23 1536  BP:  99/77  Pulse: (!) 102 98  Resp: (!) 25 16  Temp:    SpO2: 92% (!) 89%     Intake/Output Summary (Last 24 hours) at 07/19/2023 1717 Last data filed at 07/19/2023 1426 Gross per 24 hour  Intake 3949.91 ml  Output 150 ml  Net 3799.91 ml    General: NAD  Abdomen: Benign. Non-tender, +BS, no guarding.  Labs: Results for orders placed or performed during the hospital encounter of 07/18/23 (from the past 24 hour(s))  SARS Coronavirus 2 by RT PCR (hospital order, performed in [facility] hospital lab) *cepheid single result test* Anterior Nasal Swab     Status: None   Collection Time: 07/18/23  6:21 PM   Specimen: Anterior Nasal Swab  Result Value Ref Range   SARS Coronavirus 2 by RT PCR NEGATIVE NEGATIVE  Hepatic function panel     Status: None   Collection Time: 07/18/23  7:49 PM  Result Value Ref Range   Total Protein 6.6 6.5 - 8.1 g/dL   Albumin 3.6 3.5 - 5.0 g/dL   AST 35 15 - 41 U/L   ALT 37 0 - 44 U/L   Alkaline Phosphatase 50 38 - 126 U/L   Total Bilirubin 0.8 0.3 - 1.2 mg/dL   Bilirubin, Direct 0.2 0.0 - 0.2 mg/dL   Indirect Bilirubin 0.6 0.3 - 0.9 mg/dL  Protime-INR     Status: None   Collection Time: 07/18/23  7:49 PM  Result Value Ref Range   Prothrombin Time 14.8 11.4 - 15.2 seconds   INR 1.1 0.8 - 1.2  Troponin I (High Sensitivity)     Status: None   Collection Time: 07/18/23  7:49 PM  Result Value Ref Range   Troponin I (High Sensitivity) 9 <18 ng/L  Lactic acid, plasma     Status: Abnormal   Collection Time: 07/18/23  7:49  PM  Result Value Ref Range   Lactic Acid, Venous 4.9 (HH) 0.5 - 1.9 mmol/L  Hemoglobin and hematocrit, blood     Status: None   Collection Time: 07/18/23  9:31 PM  Result Value Ref Range   Hemoglobin 12.7 12.0 - 15.0 g/dL   HCT 01.0 27.2 - 53.6 %  Culture, blood (Routine X 2) w Reflex to ID Panel     Status: None (Preliminary result)   Collection Time: 07/18/23  9:31 PM   Specimen: BLOOD  Result Value Ref Range   Specimen Description BLOOD BLOOD RIGHT ARM    Special Requests      BOTTLES DRAWN AEROBIC AND ANAEROBIC Blood Culture results may not be optimal due to an excessive volume of blood received in culture bottles   Culture      NO GROWTH < 12 HOURS Performed at [facility], [address] [place], [place] [address]    Report Status PENDING   Culture, blood (Routine X 2) w Reflex to ID Panel     Status: None (Preliminary result)   Collection Time: 07/18/23  9:31 PM   Specimen: BLOOD  Result Value Ref Range   Specimen Description BLOOD BLOOD RIGHT ARM    Special Requests      BOTTLES DRAWN AEROBIC AND ANAEROBIC Blood Culture results may not be optimal due to an excessive volume of blood received in culture bottles   Culture      NO GROWTH < 12 HOURS Performed at [facility], [address] [place], [place] [address]    Report Status PENDING   Blood gas, venous     Status: Abnormal   Collection Time: 07/18/23  9:40 PM  Result Value Ref Range   pH, Ven 7.39 7.25 - 7.43   pCO2, Ven 35 (L) 44 - 60 mmHg   pO2, Ven 43 32 - 45 mmHg   Bicarbonate 21.2 20.0 - 28.0 mmol/L   Acid-base deficit 3.1 (H) 0.0 - 2.0 mmol/L   O2 Saturation 75.1 %   Patient temperature 37.0    Collection site VEIN   Procalcitonin     Status: None   Collection Time: 07/18/23  9:40 PM  Result Value Ref Range   Procalcitonin <0.10 ng/mL  Lactic acid, plasma     Status: Abnormal   Collection Time: 07/18/23 10:17 PM  Result Value Ref Range   Lactic Acid, Venous 4.5 (HH) 0.5 - 1.9  mmol/L  Respiratory (~20 pathogens) panel by PCR     Status: None   Collection Time: 07/18/23 10:30 PM   Specimen: Nasopharyngeal Swab; Respiratory  Result Value Ref Range   Adenovirus NOT DETECTED NOT DETECTED   Coronavirus 229E NOT DETECTED NOT DETECTED   Coronavirus HKU1 NOT DETECTED NOT DETECTED   Coronavirus NL63 NOT DETECTED NOT DETECTED   Coronavirus OC43 NOT DETECTED NOT DETECTED   Metapneumovirus NOT DETECTED NOT DETECTED   Rhinovirus / Enterovirus NOT DETECTED NOT DETECTED   Influenza A NOT DETECTED NOT DETECTED   Influenza B NOT DETECTED NOT DETECTED   Parainfluenza Virus 1 NOT DETECTED NOT DETECTED   Parainfluenza Virus 2 NOT DETECTED NOT DETECTED   Parainfluenza Virus 3 NOT DETECTED NOT DETECTED   Parainfluenza Virus 4 NOT DETECTED NOT DETECTED   Respiratory Syncytial Virus NOT DETECTED NOT DETECTED   Bordetella pertussis NOT DETECTED NOT DETECTED   Bordetella Parapertussis NOT DETECTED NOT DETECTED   Chlamydophila pneumoniae NOT DETECTED NOT DETECTED   Mycoplasma pneumoniae NOT DETECTED NOT DETECTED  MRSA Next Gen by PCR, Nasal     Status: None   Collection Time: 07/18/23 10:30 PM   Specimen: Nasal Mucosa; Nasal Swab  Result Value Ref Range   MRSA by PCR Next Gen NOT DETECTED NOT DETECTED  Urinalysis, Routine w reflex microscopic -Urine, Clean Catch     Status: Abnormal   Collection Time: 07/18/23 11:40 PM  Result Value Ref Range   Color, Urine AMBER (A) YELLOW   APPearance CLOUDY (A) CLEAR   Specific Gravity, Urine 1.016 1.005 - 1.030   pH 6.0 5.0 - 8.0   Glucose, UA NEGATIVE NEGATIVE mg/dL   Hgb urine dipstick LARGE (A) NEGATIVE   Bilirubin Urine NEGATIVE NEGATIVE   Ketones, ur NEGATIVE NEGATIVE mg/dL   Protein, ur NEGATIVE NEGATIVE mg/dL   Nitrite NEGATIVE NEGATIVE   Leukocytes,Ua TRACE (A) NEGATIVE   RBC / HPF >50 0 - 5 RBC/hpf   WBC, UA 11-20 0 - 5 WBC/hpf   Bacteria, UA NONE SEEN NONE SEEN   Squamous Epithelial / HPF 0-5 0 - 5 /HPF   Mucus PRESENT    HIV  Antibody (routine testing w rflx)     Status: None   Collection Time: 07/19/23  1:03 AM  Result Value Ref Range   HIV Screen 4th Generation wRfx Non Reactive Non Reactive  CBC with Differential/Platelet     Status: Abnormal   Collection Time: 07/19/23  1:03 AM  Result Value Ref Range   WBC 12.5 (H) 4.0 - 10.5 K/uL   RBC 3.84 (L) 3.87 - 5.11 MIL/uL   Hemoglobin 11.8 (L) 12.0 - 15.0 g/dL   HCT 82.9 (L) 56.2 - 13.0 %   MCV 90.1 80.0 - 100.0 fL   MCH 30.7 26.0 - 34.0 pg   MCHC 34.1 30.0 - 36.0 g/dL   RDW 86.5 78.4 - 69.6 %   Platelets 193 150 - 400 K/uL   nRBC 0.0 0.0 - 0.2 %   Neutrophils Relative % 84 %   Neutro Abs 10.6 (H) 1.7 - 7.7 K/uL   Lymphocytes Relative 11 %   Lymphs Abs 1.3 0.7 - 4.0 K/uL   Monocytes Relative 4 %   Monocytes Absolute 0.5 0.1 - 1.0 K/uL   Eosinophils Relative 0 %   Eosinophils Absolute 0.0 0.0 - 0.5 K/uL   Basophils Relative 0 %   Basophils Absolute 0.0 0.0 - 0.1 K/uL   Immature Granulocytes 1 %   Abs Immature Granulocytes 0.10 (H) 0.00 - 0.07 K/uL  Basic metabolic panel     Status: Abnormal   Collection Time: 07/19/23  1:03 AM  Result Value Ref Range   Sodium 135 135 - 145 mmol/L   Potassium 4.4 3.5 - 5.1 mmol/L   Chloride 103 98 - 111 mmol/L   CO2 21 (L) 22 - 32 mmol/L   Glucose, Bld 178 (H) 70 - 99 mg/dL   BUN 8 6 - 20 mg/dL   Creatinine, Ser 2.95 0.44 - 1.00 mg/dL   Calcium 8.2 (L) 8.9 - 10.3 mg/dL   GFR, Estimated >28 >41 mL/min   Anion gap 11 5 - 15  Lactic acid, plasma     Status: Abnormal   Collection Time: 07/19/23  1:03 AM  Result Value Ref Range   Lactic Acid, Venous 3.5 (HH) 0.5 - 1.9 mmol/L  Lactic acid, plasma     Status: Abnormal   Collection Time: 07/19/23  6:19 AM  Result Value Ref Range   Lactic Acid, Venous 3.7 (HH) 0.5 - 1.9 mmol/L  Urinalysis, w/ Reflex to Culture (Infection Suspected) -Urine, Clean Catch     Status: Abnormal   Collection Time: 07/19/23  9:28 AM  Result Value Ref Range   Specimen Source URINE,  CLEAN CATCH    Color, Urine YELLOW (A) YELLOW   APPearance HAZY (A) CLEAR   Specific Gravity, Urine 1.028 1.005 - 1.030   pH 5.0 5.0 - 8.0   Glucose, UA NEGATIVE NEGATIVE mg/dL   Hgb urine dipstick MODERATE (A) NEGATIVE   Bilirubin Urine NEGATIVE NEGATIVE   Ketones, ur NEGATIVE NEGATIVE mg/dL   Protein, ur NEGATIVE NEGATIVE mg/dL   Nitrite NEGATIVE NEGATIVE   Leukocytes,Ua SMALL (A) NEGATIVE   RBC / HPF 21-50 0 - 5 RBC/hpf   WBC, UA 11-20 0 - 5 WBC/hpf   Bacteria, UA NONE SEEN NONE SEEN   Squamous Epithelial / HPF 6-10 0 - 5 /HPF   Mucus PRESENT   Brain natriuretic peptide     Status: Abnormal   Collection Time: 07/19/23  3:42 PM  Result Value Ref Range   B Natriuretic Peptide 470.4 (H) 0.0 -  100.0 pg/mL    Cultures: Results for orders placed or performed during the hospital encounter of 07/18/23  SARS Coronavirus 2 by RT PCR (hospital order, performed in [facility] hospital lab) *cepheid single result test* Anterior Nasal Swab     Status: None   Collection Time: 07/18/23  6:21 PM   Specimen: Anterior Nasal Swab  Result Value Ref Range Status   SARS Coronavirus 2 by RT PCR NEGATIVE NEGATIVE Final    Comment: (NOTE) SARS-CoV-2 target nucleic acids are NOT DETECTED.  The SARS-CoV-2 RNA is generally detectable in upper and lower respiratory specimens during the acute phase of infection. The lowest concentration of SARS-CoV-2 viral copies this assay can detect is 250 copies / mL. A negative result does not preclude SARS-CoV-2 infection and should not be used as the sole basis for treatment or other patient management decisions.  A negative result may occur with improper specimen collection / handling, submission of specimen other than nasopharyngeal swab, presence of viral mutation(s) within the areas targeted by this assay, and inadequate number of viral copies (<250 copies / mL). A negative result must be combined with clinical observations, patient history, and  epidemiological information.  Fact Sheet for Patients:   RoadLapTop.co.za  Fact Sheet for Healthcare Providers: http://kim-miller.com/  This test is not yet approved or  cleared by the [place] FDA and has been authorized for detection and/or diagnosis of SARS-CoV-2 by FDA under an Emergency Use Authorization (EUA).  This EUA will remain in effect (meaning this test can be used) for the duration of the COVID-19 declaration under Section 564(b)(1) of the Act, 21 U.S.C. section 360bbb-3(b)(1), unless the authorization is terminated or revoked sooner.  Performed at [facility], [address] [place], [place] [address]   Culture, blood (Routine X 2) w Reflex to ID Panel     Status: None (Preliminary result)   Collection Time: 07/18/23  9:31 PM   Specimen: BLOOD  Result Value Ref Range Status   Specimen Description BLOOD BLOOD RIGHT ARM  Final   Special Requests   Final    BOTTLES DRAWN AEROBIC AND ANAEROBIC Blood Culture results may not be optimal due to an excessive volume of blood received in culture bottles   Culture   Final    NO GROWTH < 12 HOURS Performed at [facility], [address], [place], [place] [address]    Report Status PENDING  Incomplete  Culture, blood (Routine X 2) w Reflex to ID Panel     Status: None (Preliminary result)   Collection Time: 07/18/23  9:31 PM   Specimen: BLOOD  Result Value Ref Range Status   Specimen Description BLOOD BLOOD RIGHT ARM  Final   Special Requests   Final    BOTTLES DRAWN AEROBIC AND ANAEROBIC Blood Culture results may not be optimal due to an excessive volume of blood received in culture bottles   Culture   Final    NO GROWTH < 12 HOURS Performed at [facility], [address] [place], [place] [address]    Report Status PENDING  Incomplete  Respiratory (~20 pathogens) panel by PCR     Status: None   Collection Time: 07/18/23 10:30 PM    Specimen: Nasopharyngeal Swab; Respiratory  Result Value Ref Range Status   Adenovirus NOT DETECTED NOT DETECTED Final   Coronavirus 229E NOT DETECTED NOT DETECTED Final    Comment: (NOTE) The Coronavirus on the Respiratory Panel, DOES NOT test for the novel  Coronavirus (  2019 nCoV)    Coronavirus HKU1 NOT DETECTED NOT DETECTED Final   Coronavirus NL63 NOT DETECTED NOT DETECTED Final   Coronavirus OC43 NOT DETECTED NOT DETECTED Final   Metapneumovirus NOT DETECTED NOT DETECTED Final   Rhinovirus / Enterovirus NOT DETECTED NOT DETECTED Final   Influenza A NOT DETECTED NOT DETECTED Final   Influenza B NOT DETECTED NOT DETECTED Final   Parainfluenza Virus 1 NOT DETECTED NOT DETECTED Final   Parainfluenza Virus 2 NOT DETECTED NOT DETECTED Final   Parainfluenza Virus 3 NOT DETECTED NOT DETECTED Final   Parainfluenza Virus 4 NOT DETECTED NOT DETECTED Final   Respiratory Syncytial Virus NOT DETECTED NOT DETECTED Final   Bordetella pertussis NOT DETECTED NOT DETECTED Final   Bordetella Parapertussis NOT DETECTED NOT DETECTED Final   Chlamydophila pneumoniae NOT DETECTED NOT DETECTED Final   Mycoplasma pneumoniae NOT DETECTED NOT DETECTED Final    Comment: Performed at [facility], [address] [place], [place] [address]  MRSA Next Gen by PCR, Nasal     Status: None   Collection Time: 07/18/23 10:30 PM   Specimen: Nasal Mucosa; Nasal Swab  Result Value Ref Range Status   MRSA by PCR Next Gen NOT DETECTED NOT DETECTED Final    Comment: (NOTE) The GeneXpert MRSA Assay (FDA approved for NASAL specimens only), is one component of a comprehensive MRSA colonization surveillance program. It is not intended to diagnose MRSA infection nor to guide or monitor treatment for MRSA infections. Test performance is not FDA approved in patients less than 44 years old. Performed at [facility], [address] [place], [place] [address]     Imaging: ECHOCARDIOGRAM  COMPLETE  Result Date: 07/19/2023    ECHOCARDIOGRAM REPORT   Patient Name:   [name] Date of Exam: 07/19/2023 Medical Rec #:  [id number]    Height:       63.0 in Accession #:    [id number]   Weight:       164.0 lb Date of Birth:  [birth year]    BSA:          1.777 m Patient Age:    47 years     BP:           113/78 mmHg Patient Gender: F            HR:           107 bpm. Exam Location:  [facility] Procedure: 2D Echo, Cardiac Doppler and Color Doppler Indications:     Chest pain  History:         Patient has prior history of Echocardiogram examinations, most                  recent 03/02/2021. Signs/Symptoms:Chest Pain and Fatigue.  Sonographer:     Mikki Harbor Referring Phys:  [id number] Verdene Lennert Diagnosing Phys: Debbe Odea MD  Sonographer Comments: Image acquisition challenging due to respiratory motion. IMPRESSIONS  1. Left ventricular ejection fraction, by estimation, is 55 to 60%. The left ventricle has normal function. The left ventricle has no regional wall motion abnormalities. Left ventricular diastolic parameters are consistent with Grade II diastolic dysfunction (pseudonormalization). There is the interventricular septum is flattened in systole and diastole, consistent with right ventricular pressure and volume overload.  2. Right ventricular systolic function is mildly reduced. The right ventricular size is moderately enlarged. There is severely elevated pulmonary artery systolic pressure. The estimated right ventricular systolic pressure is 82.6 mmHg.  3. The  mitral valve is normal in structure. No evidence of mitral valve regurgitation.  4. Tricuspid valve regurgitation is moderate to severe.  5. The aortic valve is tricuspid. Aortic valve regurgitation is not visualized. FINDINGS  Left Ventricle: Left ventricular ejection fraction, by estimation, is 55 to 60%. The left ventricle has normal function. The left ventricle has no regional wall motion abnormalities. The left ventricular internal  cavity size was normal in size. There is  no left ventricular hypertrophy. The interventricular septum is flattened in systole and diastole, consistent with right ventricular pressure and volume overload. Left ventricular diastolic parameters are consistent with Grade II diastolic dysfunction (pseudonormalization). Right Ventricle: The right ventricular size is moderately enlarged. No increase in right ventricular wall thickness. Right ventricular systolic function is mildly reduced. There is severely elevated pulmonary artery systolic pressure. The tricuspid regurgitant velocity is 4.46 m/s, and with an assumed right atrial pressure of 3 mmHg, the estimated right ventricular systolic pressure is 82.6 mmHg. Left Atrium: Left atrial size was normal in size. Right Atrium: Right atrial size was normal in size. Pericardium: Trivial pericardial effusion is present. Mitral Valve: The mitral valve is normal in structure. No evidence of mitral valve regurgitation. MV peak gradient, 4.8 mmHg. The mean mitral valve gradient is 3.0 mmHg. Tricuspid Valve: The tricuspid valve is normal in structure. Tricuspid valve regurgitation is moderate to severe. Aortic Valve: The aortic valve is tricuspid. Aortic valve regurgitation is not visualized. Aortic valve mean gradient measures 2.0 mmHg. Aortic valve peak gradient measures 4.9 mmHg. Aortic valve area, by VTI measures 1.97 cm. Pulmonic Valve: The pulmonic valve was not well visualized. Pulmonic valve regurgitation is not visualized. Aorta: The aortic root is normal in size and structure. Venous: The inferior vena cava was not well visualized. IAS/Shunts: No atrial level shunt detected by color flow Doppler.  LEFT VENTRICLE PLAX 2D LVIDd:         3.50 cm   Diastology LVIDs:         2.60 cm   LV e' medial:    4.79 cm/s LV PW:         0.70 cm   LV E/e' medial:  14.0 LV IVS:        0.80 cm   LV e' lateral:   4.68 cm/s LVOT diam:     1.90 cm   LV E/e' lateral: 14.3 LV SV:         39 LV  SV Index:   22 LVOT Area:     2.84 cm  RIGHT VENTRICLE RV Basal diam:  3.30 cm RV Mid diam:    3.50 cm RV S prime:     12.60 cm/s TAPSE (M-mode): 1.4 cm LEFT ATRIUM             Index        RIGHT ATRIUM           Index LA diam:        3.40 cm 1.91 cm/m   RA Area:     13.50 cm LA Vol (A2C):   35.1 ml 19.75 ml/m  RA Volume:   37.50 ml  21.10 ml/m LA Vol (A4C):   22.1 ml 12.43 ml/m LA Biplane Vol: 27.7 ml 15.59 ml/m  AORTIC VALVE                    PULMONIC VALVE AV Area (Vmax):    2.32 cm     PV Vmax:  0.76 m/s AV Area (Vmean):   2.15 cm     PV Peak grad:  2.3 mmHg AV Area (VTI):     1.97 cm AV Vmax:           111.00 cm/s AV Vmean:          70.300 cm/s AV VTI:            0.197 m AV Peak Grad:      4.9 mmHg AV Mean Grad:      2.0 mmHg LVOT Vmax:         91.00 cm/s LVOT Vmean:        53.300 cm/s LVOT VTI:          0.137 m LVOT/AV VTI ratio: 0.70  AORTA Ao Root diam: 3.00 cm MITRAL VALVE                TRICUSPID VALVE MV Area (PHT): 4.58 cm     TR Peak grad:   79.6 mmHg MV Area VTI:   2.05 cm     TR Vmax:        446.00 cm/s MV Peak grad:  4.8 mmHg MV Mean grad:  3.0 mmHg     SHUNTS MV Vmax:       1.10 m/s     Systemic VTI:  0.14 m MV Vmean:      75.2 cm/s    Systemic Diam: 1.90 cm MV Decel Time: 166 msec MV E velocity: 66.85 cm/s MV A velocity: 112.00 cm/s MV E/A ratio:  0.60 Debbe Odea MD Electronically signed by Debbe Odea MD Signature Date/Time: 07/19/2023/10:23:16 AM    Final    CT Angio Chest Pulmonary Embolism (PE) W or WO Contrast  Result Date: 07/18/2023 CLINICAL DATA:  Shortness of breath. Concern for pulmonary embolism. Recent surgery. EXAM: CT ANGIOGRAPHY CHEST WITH CONTRAST TECHNIQUE: Multidetector CT imaging of the chest was performed using the standard protocol during bolus administration of intravenous contrast. Multiplanar CT image reconstructions and MIPs were obtained to evaluate the vascular anatomy. RADIATION DOSE REDUCTION: This exam was performed according to the  departmental dose-optimization program which includes automated exposure control, adjustment of the mA and/or kV according to patient size and/or use of iterative reconstruction technique. CONTRAST:  60mL OMNIPAQUE IOHEXOL 350 MG/ML SOLN COMPARISON:  Chest CT dated 03/01/2021. FINDINGS: Cardiovascular: There is no cardiomegaly or pericardial effusion. There is coronary vascular calcification involving the LAD and RCA. Mild atherosclerotic calcification of the aortic arch. No aneurysmal dilatation or dissection. The origins of the great vessels of the aortic arch appear patent. No pulmonary artery embolus identified. Mediastinum/Nodes: No hilar or mediastinal adenopathy. The esophagus is grossly unremarkable no mediastinal fluid collection. Lungs/Pleura: No focal consolidation, pleural effusion, or pneumothorax. The central airways are patent. Upper Abdomen: Pneumoperitoneum in keeping with hysterectomy. Fatty liver. Musculoskeletal: No acute osseous pathology. Review of the MIP images confirms the above findings. IMPRESSION: 1. No acute intrathoracic pathology. No CT evidence of pulmonary embolism. 2. Pneumoperitoneum in keeping with hysterectomy. 3. Fatty liver. 4.  Aortic Atherosclerosis (ICD10-I70.0). Electronically Signed   By: Elgie Collard M.D.   On: 07/18/2023 20:08   DG Chest Port 1 View  Result Date: 07/18/2023 CLINICAL DATA:  [id number] Hypoxia [id number] EXAM: PORTABLE CHEST 1 VIEW COMPARISON:  Chest x-ray 06/17/2023 FINDINGS: The heart and mediastinal contours are within normal limits. No focal consolidation. No pulmonary edema. No pleural effusion. No pneumothorax. No acute osseous abnormality. IMPRESSION: No active disease. Electronically Signed   By: Blanchie Serve  Tessie Fass M.D.   On: 07/18/2023 14:09     Assessment   47 y.o. [id number] Hospital Day: 2 for surgical standpoint  stable   Significant Cardiac d/o   Plan   1. Cardiology eval in process. 2. Add pyridium for dysuria , colace and miralax

## 2023-07-19 NOTE — Progress Notes (Signed)
[facility]  - [place] [facility]   PATIENT NAME: [name]    MR#:  [id number]  DATE OF BIRTH:  [birth year]  SUBJECTIVE:  patient seen earlier. Complains of pain around the laparoscopic scar area on the abdomen. She has significant amount of burning in her vaginally area postsurgery. Tolerating PO diet well. Became very tachycardic with heart rate in the 150s and sets down in the 80s on ambulation got her very anxious. No family at bedside. Patient denies any chest pain. She has been noticing shortness of breath for last two months. She was treated with antibiotics sometime in the summer for community acquired pneumonia. She quit smoking few weeks ago.    VITALS:  Blood pressure 104/71, pulse (!) 102, temperature 98.2 F (36.8 C), temperature source Oral, resp. rate (!) 25, height 5\' 3"  (1.6 m), weight 74.4 kg, SpO2 92%.  PHYSICAL EXAMINATION:   GENERAL:  47 y.o.-year-old patient with no acute distress. Obese LUNGS: Normal breath sounds bilaterally, no wheezing CARDIOVASCULAR: S1, S2 normal. No murmur tachycardia ABDOMEN: Soft, nontender, nondistended. Bowel sounds present.  EXTREMITIES: No  edema b/l.    NEUROLOGIC: nonfocal  patient is alert and awake SKIN: No obvious rash, lesion, or ulcer.   LABORATORY PANEL:  CBC Recent Labs  Lab 07/19/23 0103  WBC 12.5*  HGB 11.8*  HCT 34.6*  PLT 193    Chemistries  Recent Labs  Lab 07/18/23 1949 07/19/23 0103  NA  --  135  K  --  4.4  CL  --  103  CO2  --  21*  GLUCOSE  --  178*  BUN  --  8  CREATININE  --  0.75  CALCIUM  --  8.2*  AST 35  --   ALT 37  --   ALKPHOS 50  --   BILITOT 0.8  --    Cardiac Enzymes No results for input(s): "TROPONINI" in the last 168 hours. RADIOLOGY:  ECHOCARDIOGRAM COMPLETE  Result Date: 07/19/2023    ECHOCARDIOGRAM REPORT   Patient Name:   [name] Date of Exam: 07/19/2023 Medical Rec #:  [id number]    Height:       63.0 in Accession #:    [id number]   Weight:        164.0 lb Date of Birth:  [birth year]    BSA:          1.777 m Patient Age:    47 years     BP:           113/78 mmHg Patient Gender: F            HR:           107 bpm. Exam Location:  [facility] Procedure: 2D Echo, Cardiac Doppler and Color Doppler Indications:     Chest pain  History:         Patient has prior history of Echocardiogram examinations, most                  recent 03/02/2021. Signs/Symptoms:Chest Pain and Fatigue.  Sonographer:     Mikki Harbor Referring Phys:  [id number] Verdene Lennert Diagnosing Phys: Debbe Odea MD  Sonographer Comments: Image acquisition challenging due to respiratory motion. IMPRESSIONS  1. Left ventricular ejection fraction, by estimation, is 55 to 60%. The left ventricle has normal function. The left ventricle has no regional wall motion abnormalities. Left ventricular diastolic parameters are consistent with Grade II diastolic dysfunction (pseudonormalization). There is the interventricular  septum is flattened in systole and diastole, consistent with right ventricular pressure and volume overload.  2. Right ventricular systolic function is mildly reduced. The right ventricular size is moderately enlarged. There is severely elevated pulmonary artery systolic pressure. The estimated right ventricular systolic pressure is 82.6 mmHg.  3. The mitral valve is normal in structure. No evidence of mitral valve regurgitation.  4. Tricuspid valve regurgitation is moderate to severe.  5. The aortic valve is tricuspid. Aortic valve regurgitation is not visualized. FINDINGS  Left Ventricle: Left ventricular ejection fraction, by estimation, is 55 to 60%. The left ventricle has normal function. The left ventricle has no regional wall motion abnormalities. The left ventricular internal cavity size was normal in size. There is  no left ventricular hypertrophy. The interventricular septum is flattened in systole and diastole, consistent with right ventricular pressure and volume overload.  Left ventricular diastolic parameters are consistent with Grade II diastolic dysfunction (pseudonormalization). Right Ventricle: The right ventricular size is moderately enlarged. No increase in right ventricular wall thickness. Right ventricular systolic function is mildly reduced. There is severely elevated pulmonary artery systolic pressure. The tricuspid regurgitant velocity is 4.46 m/s, and with an assumed right atrial pressure of 3 mmHg, the estimated right ventricular systolic pressure is 82.6 mmHg. Left Atrium: Left atrial size was normal in size. Right Atrium: Right atrial size was normal in size. Pericardium: Trivial pericardial effusion is present. Mitral Valve: The mitral valve is normal in structure. No evidence of mitral valve regurgitation. MV peak gradient, 4.8 mmHg. The mean mitral valve gradient is 3.0 mmHg. Tricuspid Valve: The tricuspid valve is normal in structure. Tricuspid valve regurgitation is moderate to severe. Aortic Valve: The aortic valve is tricuspid. Aortic valve regurgitation is not visualized. Aortic valve mean gradient measures 2.0 mmHg. Aortic valve peak gradient measures 4.9 mmHg. Aortic valve area, by VTI measures 1.97 cm. Pulmonic Valve: The pulmonic valve was not well visualized. Pulmonic valve regurgitation is not visualized. Aorta: The aortic root is normal in size and structure. Venous: The inferior vena cava was not well visualized. IAS/Shunts: No atrial level shunt detected by color flow Doppler.  LEFT VENTRICLE PLAX 2D LVIDd:         3.50 cm   Diastology LVIDs:         2.60 cm   LV e' medial:    4.79 cm/s LV PW:         0.70 cm   LV E/e' medial:  14.0 LV IVS:        0.80 cm   LV e' lateral:   4.68 cm/s LVOT diam:     1.90 cm   LV E/e' lateral: 14.3 LV SV:         39 LV SV Index:   22 LVOT Area:     2.84 cm  RIGHT VENTRICLE RV Basal diam:  3.30 cm RV Mid diam:    3.50 cm RV S prime:     12.60 cm/s TAPSE (M-mode): 1.4 cm LEFT ATRIUM             Index        RIGHT ATRIUM            Index LA diam:        3.40 cm 1.91 cm/m   RA Area:     13.50 cm LA Vol (A2C):   35.1 ml 19.75 ml/m  RA Volume:   37.50 ml  21.10 ml/m LA Vol (A4C):   22.1 ml 12.43 ml/m  LA Biplane Vol: 27.7 ml 15.59 ml/m  AORTIC VALVE                    PULMONIC VALVE AV Area (Vmax):    2.32 cm     PV Vmax:       0.76 m/s AV Area (Vmean):   2.15 cm     PV Peak grad:  2.3 mmHg AV Area (VTI):     1.97 cm AV Vmax:           111.00 cm/s AV Vmean:          70.300 cm/s AV VTI:            0.197 m AV Peak Grad:      4.9 mmHg AV Mean Grad:      2.0 mmHg LVOT Vmax:         91.00 cm/s LVOT Vmean:        53.300 cm/s LVOT VTI:          0.137 m LVOT/AV VTI ratio: 0.70  AORTA Ao Root diam: 3.00 cm MITRAL VALVE                TRICUSPID VALVE MV Area (PHT): 4.58 cm     TR Peak grad:   79.6 mmHg MV Area VTI:   2.05 cm     TR Vmax:        446.00 cm/s MV Peak grad:  4.8 mmHg MV Mean grad:  3.0 mmHg     SHUNTS MV Vmax:       1.10 m/s     Systemic VTI:  0.14 m MV Vmean:      75.2 cm/s    Systemic Diam: 1.90 cm MV Decel Time: 166 msec MV E velocity: 66.85 cm/s MV A velocity: 112.00 cm/s MV E/A ratio:  0.60 Debbe Odea MD Electronically signed by Debbe Odea MD Signature Date/Time: 07/19/2023/10:23:16 AM    Final    CT Angio Chest Pulmonary Embolism (PE) W or WO Contrast  Result Date: 07/18/2023 CLINICAL DATA:  Shortness of breath. Concern for pulmonary embolism. Recent surgery. EXAM: CT ANGIOGRAPHY CHEST WITH CONTRAST TECHNIQUE: Multidetector CT imaging of the chest was performed using the standard protocol during bolus administration of intravenous contrast. Multiplanar CT image reconstructions and MIPs were obtained to evaluate the vascular anatomy. RADIATION DOSE REDUCTION: This exam was performed according to the departmental dose-optimization program which includes automated exposure control, adjustment of the mA and/or kV according to patient size and/or use of iterative reconstruction technique. CONTRAST:  60mL  OMNIPAQUE IOHEXOL 350 MG/ML SOLN COMPARISON:  Chest CT dated 03/01/2021. FINDINGS: Cardiovascular: There is no cardiomegaly or pericardial effusion. There is coronary vascular calcification involving the LAD and RCA. Mild atherosclerotic calcification of the aortic arch. No aneurysmal dilatation or dissection. The origins of the great vessels of the aortic arch appear patent. No pulmonary artery embolus identified. Mediastinum/Nodes: No hilar or mediastinal adenopathy. The esophagus is grossly unremarkable no mediastinal fluid collection. Lungs/Pleura: No focal consolidation, pleural effusion, or pneumothorax. The central airways are patent. Upper Abdomen: Pneumoperitoneum in keeping with hysterectomy. Fatty liver. Musculoskeletal: No acute osseous pathology. Review of the MIP images confirms the above findings. IMPRESSION: 1. No acute intrathoracic pathology. No CT evidence of pulmonary embolism. 2. Pneumoperitoneum in keeping with hysterectomy. 3. Fatty liver. 4.  Aortic Atherosclerosis (ICD10-I70.0). Electronically Signed   By: Elgie Collard M.D.   On: 07/18/2023 20:08   DG Chest Port 1 View  Result Date: 07/18/2023  CLINICAL DATA:  [id number] Hypoxia [id number] EXAM: PORTABLE CHEST 1 VIEW COMPARISON:  Chest x-ray 06/17/2023 FINDINGS: The heart and mediastinal contours are within normal limits. No focal consolidation. No pulmonary edema. No pleural effusion. No pneumothorax. No acute osseous abnormality. IMPRESSION: No active disease. Electronically Signed   By: Tish Frederickson M.D.   On: 07/18/2023 14:09    Assessment and Plan  Camile L Timothy is a 47 y.o. female with medical history significant of migraines, menorrhagia who presents to the hospital for scheduled hysterectomy, with consultation placed due to shortness of breath and chest pain.  pt states that she had been experiencing some shortness of breath and subsequent development of chest tightness that occurs with exertion but she had assumed this is due  to weight gain.  Then today after her surgery, she noted that she had persistent shortness of breath with some chest tightness.  She says she quit vaping approximately 6 weeks ago.  She denies any known history of asthma or COPD.  CT chest No acute intrathoracic pathology. No CT evidence of pulmonary embolism. 2. Pneumoperitoneum in keeping with hysterectomy. 3. Fatty liver. 4.  Aortic Atherosclerosis  Echo of the heart1. Left ventricular ejection fraction, by estimation, is 55 to 60%. The  left ventricle has normal function. The left ventricle has no regional  wall motion abnormalities. Left ventricular diastolic parameters are  consistent with Grade II diastolic  dysfunction (pseudonormalization). There is the interventricular septum is  flattened in systole and diastole, consistent with right ventricular  pressure and volume overload.   2. Right ventricular systolic function is mildly reduced. The right  ventricular size is moderately enlarged. There is severely elevated  pulmonary artery systolic pressure. The estimated right ventricular  systolic pressure is 82.6 mmHg.   3. The mitral valve is normal in structure. No evidence of mitral valve  regurgitation.   4. Tricuspid valve regurgitation is moderate to severe.   5. The aortic valve is tricuspid. Aortic valve regurgitation is not  visualized.   Acute hypoxic respiratory failure (HCC) with hypoxia suspected due to severe pulmonary hypertension/elevated right ventricular pressures and moderate to severe TR Chest tightness/pain --Patient presented today for scheduled hysterectomy, complicated by postoperative hypoxia into the 70s and 80s.  Patient difficult to maintain above 90 despite 3-4 L.  Given additional tachycardia and chest heaviness, -- CT chest negative for PE -- cardiology consultation with [facility] cardiology. Dr. Kirke Corin to see patient. -- Troponin's flat -- await further cardiology recommendations -- patient  desaturated into the 80s. Continue 2 L nasal cannula oxygen.   Hepatic steatosis Likely in the setting of obesity.   S/P hysterectomy Patient is postop day # 1 s/p laparoscopic vaginal hysterectomy with right salpingectomy.  - Oxycodone as needed for pain control - Management per OB/GYN -- abnormal UA with significant burning postop will treat empirically with PO Keflex   Leukocytosis Likely reactive due to surgery earlier today.  Anxiety -- PRN Xanax, will give scheduled dose of Vistaril for couple days.  Procedures: Family communication : none Consults : OB/GYN, [facility] cardiology CODE STATUS: full DVT Prophylaxis : Lovenox Level of care: Progressive Status is: Inpatient Remains inpatient appropriate because: severe pulmonary hypertension, cardiology consult    TOTAL TIME TAKING CARE OF THIS PATIENT: 35 minutes.  >50% time spent on counselling and coordination of care  Note: This dictation was prepared with Dragon dictation along with smaller phrase technology. Any transcriptional errors that result from this process are unintentional.  Enedina Finner  M.D    [facility]   CC: Primary care physician; Larena Glassman, [place]

## 2023-07-19 NOTE — Consult Note (Signed)
Cardiology Consultation:   Patient ID: [name]; [id number]; [birth year]   Admit date: 07/18/2023 Date of Consult: 07/19/2023  Primary Care Provider: Larena Glassman, PA Primary Cardiologist: New - consult by Kirke Corin Primary Electrophysiologist:  None   Patient Profile:   [name] is a 47 y.o. 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 Allena Katz.  History of Present Illness:   [name] underwent echo in 2022 showed an EF of 60 to 65%, no regional wall motion abnormalities, normal RV systolic function and ventricular cavity size, mildly elevated RVSP estimated at 36.4 mmHg, and mild to moderate tricuspid regurgitation.    She reports being diagnosed with bronchitis/pneumonia in 05/2023 that was treated with 3 rounds of antibiotics and 3 rounds of prednisone without symptomatic improvement.  In this setting, she noted significant exertional dyspnea and chest tightness with activities such as walking to and from her parking lot at work.  She attributed this to weight gain.  She was without progressive orthopnea, PND, early satiety.  She did not note any significant lower extremity swelling, though did note some abdominal bloating.  She presented to [facility] on 07/18/2023 scheduled hysterectomy.  Leading up to, and following her procedure her exertional dyspnea and chest tightness were unchanged.  However, she was noted to be hypoxic with oxygen levels briefly trending into the 70s to 80s% requiring supplemental oxygen from 3 to 6 L via nasal cannula.  She has reported significant exertional dyspnea with ambulation to the restroom  and her hospital room, and with ambulation out in hallway with associated near syncope.  High-sensitivity troponin negative x 2.  COVID and respiratory panel-negative.  Hgb 12.8 trending to 11.8 ewith a baseline around 12-14.  Renal function normal.  Lactic acid 4.9 trending to 3.7.  Chest x-ray without acute cardiopulmonary process.  CTA chest negative for PE with pneumoperitoneum in the setting of hysterectomy, fatty liver, and aortic atherosclerosis.  Echo showed an EF of 55 to 60%, no regional wall motion abnormalities, grade 2 diastolic dysfunction, there was interventricular septum flattening in systole and diastole consistent with RV pressure and volume overload, mildly reduced RV systolic function with moderately large ventricular cavity with severely elevated RVSP estimated 82.6 mmHg, and a moderate to severe tricuspid regurgitation.  She remains on supplemental oxygen at 4 L via nasal cannula.  At rest, she is asymptomatic.  She reports her current dyspnea is no worse than what she has been experiencing over the past 2 months.  She did note some lower extremity swelling earlier today.  She continues to note a sensation of abdominal bloating that predates her surgery.  No recent prolonged sedentary time frames leading up to the development of her dyspnea.  She reports a significant weight gain.    Past Medical History:  Diagnosis Date   Abnormal thyroid function test    Anemia    Anxiety    Bronchitis 06/2023   finished prednisone and currently taking Amoxicillin as of 06-12-23   Cervical lymphadenopathy    Chronic back pain    Ectopic pregnancy    Headache    MIGRAINES   History of kidney stones    HSV  infection    Lumbar radiculopathy    Menorrhagia    Palpitations    Pneumonia    06/2023   PONV (postoperative nausea and vomiting)    DURING KIDNEY STONE REMOVAL   Seizures (HCC) 2014   one occurence-due to Tramadol   UTI (urinary tract infection)     Past Surgical History:   Procedure Laterality Date   CARPAL TUNNEL RELEASE Left 10/22/2019   Procedure: CARPAL TUNNEL RELEASE;  Surgeon: Kennedy Bucker, MD;  Location: [facility];  Service: Orthopedics;  Laterality: Left;   CYSTOSCOPY/RETROGRADE/URETEROSCOPY     DILATION AND CURETTAGE OF UTERUS  11/01/2011   Procedure: DILATATION AND CURETTAGE;  Surgeon: Zenaida Niece, MD;  Location: [facility];  Service: Gynecology;  Laterality: N/A;   HARDWARE REMOVAL Left 12/31/2019   Procedure: LEFT WRIST HARDWARE REMOVAL;  Surgeon: Kennedy Bucker, MD;  Location: [facility];  Service: Orthopedics;  Laterality: Left;   LAPAROSCOPIC TUBAL LIGATION Bilateral 07/15/2015   Procedure: LAPAROSCOPIC TUBAL LIGATION;  Surgeon: Nadara Mustard, MD;  Location: [facility];  Service: Gynecology;  Laterality: Bilateral;   LAPAROSCOPIC VAGINAL HYSTERECTOMY WITH SALPINGECTOMY Right 07/18/2023   Procedure: LAPAROSCOPIC ASSISTED VAGINAL HYSTERECTOMY WITH RIGHT SALPINGECTOMY;  Surgeon: Schermerhorn, Ihor Austin, MD;  Location: [facility];  Service: Gynecology;  Laterality: Right;   LAPAROSCOPY  11/01/2011   Procedure: LAPAROSCOPY OPERATIVE;  Surgeon: Zenaida Niece, MD;  Location: [facility];  Service: Gynecology;  Laterality: N/A;   LAPAROSCOPY FOR ECTOPIC PREGNANCY  2008   mtx also   LITHOTRIPSY     OPEN REDUCTION INTERNAL FIXATION (ORIF) DISTAL RADIAL FRACTURE Left 10/22/2019   Procedure: OPEN REDUCTION INTERNAL FIXATION (ORIF) DISTAL RADIAL FRACTURE;  Surgeon: Kennedy Bucker, MD;  Location: [facility];  Service: Orthopedics;  Laterality: Left;   TUBAL LIGATION       Home Meds: Prior to Admission medications   Medication Sig Start Date End Date Taking? Authorizing Provider  albuterol (VENTOLIN HFA) 108 (90 Base) MCG/ACT inhaler Inhale 1-2 puffs into the lungs every 6 (six) hours as needed for wheezing or shortness of breath (was just given this for bronchitis).   Yes [provider]  benzonatate (TESSALON) 200 MG capsule Take 200 mg by mouth 3 (three)  times daily.   Yes [provider]  MAGNESIUM CITRATE PO Take 1 tablet by mouth daily at 6 (six) AM. Patient not taking: Reported on 07/18/2023    [provider]    Inpatient Medications: Scheduled Meds:  cephALEXin  500 mg Oral Q12H   enoxaparin (LOVENOX) injection  40 mg Subcutaneous Q24H   furosemide  40 mg Intravenous BID   hydrOXYzine  25 mg Oral TID   metoprolol tartrate  12.5 mg Oral BID   potassium chloride  20 mEq Oral BID   Continuous Infusions:  PRN Meds: acetaminophen, ALPRAZolam, levalbuterol, nitroGLYCERIN, ondansetron (ZOFRAN) IV, ondansetron, oxyCODONE-acetaminophen  Allergies:   Allergies  Allergen Reactions   Sulfa Antibiotics Hives and Itching   Tramadol Other (See Comments)    seizures   Prochlorperazine Edisylate Anxiety    tachycardia    Social History:   Social History   Socioeconomic History   Marital status: Divorced    Spouse name: Not on file   Number of children: Not on file   Years of education: Not on file   Highest education level: Not on file  Occupational History   Not on file  Tobacco Use   Smoking status: Former    Current packs/day: 0.00    Types:  Cigarettes    Quit date: 10/21/2019    Years since quitting: 3.7   Smokeless tobacco: Never  Vaping Use   Vaping status: Former   Quit date: 06/06/2023   Substances: Nicotine, Flavoring  Substance and Sexual Activity   Alcohol use: No   Drug use: No   Sexual activity: Yes  Other Topics Concern   Not on file  Social History Narrative   Not on file   Social Determinants of Health   Financial Resource Strain: Not on file  Food Insecurity: Not on file  Transportation Needs: No Transportation Needs (04/06/2021)   Received from [facility], [facility]   PRAPARE - Transportation    Lack of Transportation (Medical): No    Lack of Transportation (Non-Medical): No  Physical Activity: Not on file  Stress: Not on file  Social Connections: Not on file   Intimate Partner Violence: Not on file     Family History:   Family History  Problem Relation Age of Onset   Cancer Maternal Grandmother     ROS:  Review of Systems  Constitutional:  Positive for malaise/fatigue. Negative for chills, diaphoresis, fever and weight loss.  HENT:  Negative for congestion.   Eyes:  Negative for discharge and redness.  Respiratory:  Positive for shortness of breath. Negative for cough, sputum production and wheezing.   Cardiovascular:  Positive for chest pain and leg swelling. Negative for palpitations, orthopnea, claudication and PND.  Gastrointestinal:  Negative for abdominal pain, heartburn, nausea and vomiting.       Abdominal bloating  Musculoskeletal:  Negative for falls and myalgias.  Skin:  Negative for rash.  Neurological:  Positive for weakness. Negative for dizziness, tingling, tremors, sensory change, speech change, focal weakness and loss of consciousness.  Endo/Heme/Allergies:  Does not bruise/bleed easily.  Psychiatric/Behavioral:  Negative for substance abuse. The patient is not nervous/anxious.   All other systems reviewed and are negative.     Physical Exam/Data:   Vitals:   07/19/23 1200 07/19/23 1238 07/19/23 1243 07/19/23 1536  BP:  104/71  99/77  Pulse:  (!) 104 (!) 102 98  Resp: 14  (!) 25 16  Temp:  98.2 F (36.8 C)    TempSrc:  Oral    SpO2:  (!) 87% 92% (!) 89%  Weight:      Height:        Intake/Output Summary (Last 24 hours) at 07/19/2023 1653 Last data filed at 07/19/2023 1426 Gross per 24 hour  Intake 3949.91 ml  Output 150 ml  Net 3799.91 ml   Filed Weights   07/18/23 0631  Weight: 74.4 kg   Body mass index is 29.06 kg/m.   Physical Exam: General: Well developed, well nourished, in no acute distress. Head: Normocephalic, atraumatic, sclera non-icteric, no xanthomas, nares without discharge.  Neck: Negative for carotid bruits. JVD elevated 8 cm. Lungs: Clear bilaterally to auscultation without  wheezes, rales, or rhonchi. Breathing is unlabored. Heart: Tachycardic with S1 S2. II/VI systolic murmur LSB, no rubs, or gallops appreciated. Abdomen: Soft, non-tender, distended with hypoactive bowel sounds. No hepatomegaly. No rebound/guarding. No obvious abdominal masses. Msk:  Strength and tone appear normal for age. Extremities: No clubbing or cyanosis.  Trivial bilateral pretibial edema. Distal pedal pulses are 2+ and equal bilaterally. Neuro: Alert and oriented X 3. No facial asymmetry. No focal deficit. Moves all extremities spontaneously. Psych:  Responds to questions appropriately with a normal affect.   EKG:  The EKG was personally reviewed and  demonstrates: sinus tachycardia, 106 bpm, anterolateral T wave inversion Telemetry:  Telemetry was personally reviewed and demonstrates: sinus rhythm with sinus tachycardia with rates ranging from the 90s to 140s bpm  Weights: Filed Weights   07/18/23 0631  Weight: 74.4 kg    Relevant CV Studies:  2D echo 07/19/2023: 1. Left ventricular ejection fraction, by estimation, is 55 to 60%. The  left ventricle has normal function. The left ventricle has no regional  wall motion abnormalities. Left ventricular diastolic parameters are  consistent with Grade II diastolic  dysfunction (pseudonormalization). There is the interventricular septum is  flattened in systole and diastole, consistent with right ventricular  pressure and volume overload.   2. Right ventricular systolic function is mildly reduced. The right  ventricular size is moderately enlarged. There is severely elevated  pulmonary artery systolic pressure. The estimated right ventricular  systolic pressure is 82.6 mmHg.   3. The mitral valve is normal in structure. No evidence of mitral valve  regurgitation.   4. Tricuspid valve regurgitation is moderate to severe.   5. The aortic valve is tricuspid. Aortic valve regurgitation is not  visualized.  __________  Limited echo  03/02/2021: 1. Left ventricular ejection fraction, by estimation, is 60 to 65%. The  left ventricle has normal function. The left ventricle has no regional  wall motion abnormalities. Left ventricular diastolic parameters are  indeterminate.   2. Right ventricular systolic function is normal. The right ventricular  size is normal. There is mildly elevated pulmonary artery systolic  pressure. The estimated right ventricular systolic pressure is 36.4 mmHg.   3. The mitral valve is normal in structure. No evidence of mitral valve  regurgitation. No evidence of mitral stenosis.   4. Tricuspid valve regurgitation is mild to moderate.   Laboratory Data:  Chemistry Recent Labs  Lab 07/15/23 0944 07/18/23 1639 07/19/23 0103  NA 140 136 135  K 3.6 4.3 4.4  CL 107 104 103  CO2 23 18* 21*  GLUCOSE 113* 168* 178*  BUN 10 9 8   CREATININE 0.64 0.85 0.75  CALCIUM 8.8* 8.3* 8.2*  GFRNONAA >60 >60 >60  ANIONGAP 10 14 11     Recent Labs  Lab 07/18/23 1949  PROT 6.6  ALBUMIN 3.6  AST 35  ALT 37  ALKPHOS 50  BILITOT 0.8   Hematology Recent Labs  Lab 07/15/23 0944 07/18/23 1639 07/18/23 2131 07/19/23 0103  WBC 9.3 13.4*  --  12.5*  RBC 4.67 4.25  --  3.84*  HGB 14.2 12.8 12.7 11.8*  HCT 41.2 38.3 37.1 34.6*  MCV 88.2 90.1  --  90.1  MCH 30.4 30.1  --  30.7  MCHC 34.5 33.4  --  34.1  RDW 13.3 13.4  --  13.5  PLT 205 225  --  193   Cardiac EnzymesNo results for input(s): "TROPONINI" in the last 168 hours. No results for input(s): "TROPIPOC" in the last 168 hours.  BNP Recent Labs  Lab 07/19/23 1542  BNP 470.4*    DDimer No results for input(s): "DDIMER" in the last 168 hours.  Radiology/Studies:   CT Angio Chest Pulmonary Embolism (PE) W or WO Contrast  Result Date: 07/18/2023 IMPRESSION: 1. No acute intrathoracic pathology. No CT evidence of pulmonary embolism. 2. Pneumoperitoneum in keeping with hysterectomy. 3. Fatty liver. 4.  Aortic Atherosclerosis (ICD10-I70.0).  Electronically Signed   By: Elgie Collard M.D.   On: 07/18/2023 20:08   DG Chest Port 1 View  Result Date: 07/18/2023 IMPRESSION: No active  disease. Electronically Signed   By: Tish Frederickson M.D.   On: 07/18/2023 14:09    Assessment and Plan:   1. RV dysfunction with severe pulmonary hypertension and tricuspid regurgitation:  -Start IV Lasix 40 mg bid with KCl repletion  -Obtain BNP  -CTA negative for acute PE, PNA, or acute thoracic process  -Ruled out  -EKG with new anterolateral st/t changes  -Would benefit from VQ scan to evaluate for chronic PE  -Will need R/LHC following optimization of volume status, likely early next week, followed by further escalation of medical therapy as indicated -Patient will need advanced heart failure evaluation  -Strict I/O  -Daily standing scale weights -Will need close monitoring of renal function    For questions or updates, please contact [facility] Please consult [email or web address] for contact info under Cardiology/STEMI.   Signed, Eula Listen, PA-C [facility] Pager: [phone] 07/19/2023, 4:53 PM

## 2023-07-19 NOTE — Progress Notes (Signed)
*  PRELIMINARY RESULTS* Echocardiogram 2D Echocardiogram has been performed.  Carolyne Fiscal 07/19/2023, 9:18 AM

## 2023-07-20 DIAGNOSIS — J9601 Acute respiratory failure with hypoxia: Secondary | ICD-10-CM | POA: Diagnosis not present

## 2023-07-20 DIAGNOSIS — I272 Pulmonary hypertension, unspecified: Secondary | ICD-10-CM | POA: Diagnosis not present

## 2023-07-20 LAB — CBC
HCT: 36.5 % (ref 36.0–46.0)
Hemoglobin: 11.9 g/dL — ABNORMAL LOW (ref 12.0–15.0)
MCH: 30.7 pg (ref 26.0–34.0)
MCHC: 32.6 g/dL (ref 30.0–36.0)
MCV: 94.3 fL (ref 80.0–100.0)
Platelets: 233 10*3/uL (ref 150–400)
RBC: 3.87 MIL/uL (ref 3.87–5.11)
RDW: 14.1 % (ref 11.5–15.5)
WBC: 11.5 10*3/uL — ABNORMAL HIGH (ref 4.0–10.5)
nRBC: 0 % (ref 0.0–0.2)

## 2023-07-20 LAB — BASIC METABOLIC PANEL
Anion gap: 9 (ref 5–15)
BUN: 19 mg/dL (ref 6–20)
CO2: 25 mmol/L (ref 22–32)
Calcium: 8.6 mg/dL — ABNORMAL LOW (ref 8.9–10.3)
Chloride: 105 mmol/L (ref 98–111)
Creatinine, Ser: 0.79 mg/dL (ref 0.44–1.00)
GFR, Estimated: >60 mL/min (ref >60)
Glucose, Bld: 106 mg/dL — ABNORMAL HIGH (ref 70–99)
Potassium: 3.9 mmol/L (ref 3.5–5.1)
Sodium: 139 mmol/L (ref 135–145)

## 2023-07-20 LAB — LACTIC ACID, PLASMA: Lactic Acid, Venous: 1.5 mmol/L (ref 0.5–1.9)

## 2023-07-20 LAB — HEMOGLOBIN A1C
Hgb A1c MFr Bld: 5.7 % — ABNORMAL HIGH (ref 4.8–5.6)
Mean Plasma Glucose: 117 mg/dL

## 2023-07-20 MED ORDER — OXYCODONE HCL 5 MG PO TABS
5.0000 mg | ORAL_TABLET | Freq: Four times a day (QID) | ORAL | Status: DC | PRN
  Administered 2023-07-20 – 2023-07-24 (×10): 5 mg via ORAL
  Filled 2023-07-20 (×10): qty 1

## 2023-07-20 MED ORDER — OXYCODONE HCL 5 MG PO TABS
5.0000 mg | ORAL_TABLET | ORAL | Status: DC | PRN
  Administered 2023-07-20: 5 mg via ORAL
  Filled 2023-07-20: qty 1

## 2023-07-20 MED ORDER — ACETAMINOPHEN 325 MG PO TABS
650.0000 mg | ORAL_TABLET | Freq: Three times a day (TID) | ORAL | Status: DC
  Administered 2023-07-20 – 2023-07-24 (×10): 650 mg via ORAL
  Filled 2023-07-20 (×10): qty 2

## 2023-07-20 NOTE — Plan of Care (Signed)

## 2023-07-20 NOTE — Progress Notes (Signed)
[facility]  - [place] [facility]   PATIENT NAME: [name]    MR#:  [id number]  DATE OF BIRTH:  [birth year]  SUBJECTIVE:  no family at bedside. Overall feels somewhat better. Patient tells me she is overwhelmed with the diagnosis.  VITALS:  Blood pressure 122/86, pulse 99, temperature 98.1 F (36.7 C), resp. rate 17, height 5\' 3"  (1.6 m), weight 74.4 kg, SpO2 92%.  PHYSICAL EXAMINATION:   GENERAL:  47 y.o.-year-old patient with no acute distress. Obese LUNGS: Normal breath sounds bilaterally, no wheezing CARDIOVASCULAR: S1, S2 normal. No murmur tachycardia ABDOMEN: Soft, nontender, nondistended. Bowel sounds present.  EXTREMITIES: No  edema b/l.    NEUROLOGIC: nonfocal  patient is alert and awake SKIN: No obvious rash, lesion, or ulcer.   LABORATORY PANEL:  CBC Recent Labs  Lab 07/20/23 0541  WBC 11.5*  HGB 11.9*  HCT 36.5  PLT 233    Chemistries  Recent Labs  Lab 07/18/23 1949 07/19/23 0103 07/20/23 0541  NA  --    < > 139  K  --    < > 3.9  CL  --    < > 105  CO2  --    < > 25  GLUCOSE  --    < > 106*  BUN  --    < > 19  CREATININE  --    < > 0.79  CALCIUM  --    < > 8.6*  AST 35  --   --   ALT 37  --   --   ALKPHOS 50  --   --   BILITOT 0.8  --   --    < > = values in this interval not displayed.   Cardiac Enzymes No results for input(s): "TROPONINI" in the last 168 hours. RADIOLOGY:  ECHOCARDIOGRAM COMPLETE  Result Date: 07/19/2023    ECHOCARDIOGRAM REPORT   Patient Name:   [name] Date of Exam: 07/19/2023 Medical Rec #:  [id number]    Height:       63.0 in Accession #:    [id number]   Weight:       164.0 lb Date of Birth:  10-10-1976    BSA:          1.777 m Patient Age:    47 years     BP:           113/78 mmHg Patient Gender: F            HR:           107 bpm. Exam Location:  [facility] Procedure: 2D Echo, Cardiac Doppler and Color Doppler Indications:     Chest pain  History:         Patient has prior history of Echocardiogram  examinations, most                  recent 03/02/2021. Signs/Symptoms:Chest Pain and Fatigue.  Sonographer:     Mikki Harbor Referring Phys:  [id number] Verdene Lennert Diagnosing Phys: Debbe Odea MD  Sonographer Comments: Image acquisition challenging due to respiratory motion. IMPRESSIONS  1. Left ventricular ejection fraction, by estimation, is 55 to 60%. The left ventricle has normal function. The left ventricle has no regional wall motion abnormalities. Left ventricular diastolic parameters are consistent with Grade II diastolic dysfunction (pseudonormalization). There is the interventricular septum is flattened in systole and diastole, consistent with right ventricular pressure and volume overload.  2. Right ventricular systolic function is  mildly reduced. The right ventricular size is moderately enlarged. There is severely elevated pulmonary artery systolic pressure. The estimated right ventricular systolic pressure is 82.6 mmHg.  3. The mitral valve is normal in structure. No evidence of mitral valve regurgitation.  4. Tricuspid valve regurgitation is moderate to severe.  5. The aortic valve is tricuspid. Aortic valve regurgitation is not visualized. FINDINGS  Left Ventricle: Left ventricular ejection fraction, by estimation, is 55 to 60%. The left ventricle has normal function. The left ventricle has no regional wall motion abnormalities. The left ventricular internal cavity size was normal in size. There is  no left ventricular hypertrophy. The interventricular septum is flattened in systole and diastole, consistent with right ventricular pressure and volume overload. Left ventricular diastolic parameters are consistent with Grade II diastolic dysfunction (pseudonormalization). Right Ventricle: The right ventricular size is moderately enlarged. No increase in right ventricular wall thickness. Right ventricular systolic function is mildly reduced. There is severely elevated pulmonary artery systolic  pressure. The tricuspid regurgitant velocity is 4.46 m/s, and with an assumed right atrial pressure of 3 mmHg, the estimated right ventricular systolic pressure is 82.6 mmHg. Left Atrium: Left atrial size was normal in size. Right Atrium: Right atrial size was normal in size. Pericardium: Trivial pericardial effusion is present. Mitral Valve: The mitral valve is normal in structure. No evidence of mitral valve regurgitation. MV peak gradient, 4.8 mmHg. The mean mitral valve gradient is 3.0 mmHg. Tricuspid Valve: The tricuspid valve is normal in structure. Tricuspid valve regurgitation is moderate to severe. Aortic Valve: The aortic valve is tricuspid. Aortic valve regurgitation is not visualized. Aortic valve mean gradient measures 2.0 mmHg. Aortic valve peak gradient measures 4.9 mmHg. Aortic valve area, by VTI measures 1.97 cm. Pulmonic Valve: The pulmonic valve was not well visualized. Pulmonic valve regurgitation is not visualized. Aorta: The aortic root is normal in size and structure. Venous: The inferior vena cava was not well visualized. IAS/Shunts: No atrial level shunt detected by color flow Doppler.  LEFT VENTRICLE PLAX 2D LVIDd:         3.50 cm   Diastology LVIDs:         2.60 cm   LV e' medial:    4.79 cm/s LV PW:         0.70 cm   LV E/e' medial:  14.0 LV IVS:        0.80 cm   LV e' lateral:   4.68 cm/s LVOT diam:     1.90 cm   LV E/e' lateral: 14.3 LV SV:         39 LV SV Index:   22 LVOT Area:     2.84 cm  RIGHT VENTRICLE RV Basal diam:  3.30 cm RV Mid diam:    3.50 cm RV S prime:     12.60 cm/s TAPSE (M-mode): 1.4 cm LEFT ATRIUM             Index        RIGHT ATRIUM           Index LA diam:        3.40 cm 1.91 cm/m   RA Area:     13.50 cm LA Vol (A2C):   35.1 ml 19.75 ml/m  RA Volume:   37.50 ml  21.10 ml/m LA Vol (A4C):   22.1 ml 12.43 ml/m LA Biplane Vol: 27.7 ml 15.59 ml/m  AORTIC VALVE  PULMONIC VALVE AV Area (Vmax):    2.32 cm     PV Vmax:       0.76 m/s AV Area  (Vmean):   2.15 cm     PV Peak grad:  2.3 mmHg AV Area (VTI):     1.97 cm AV Vmax:           111.00 cm/s AV Vmean:          70.300 cm/s AV VTI:            0.197 m AV Peak Grad:      4.9 mmHg AV Mean Grad:      2.0 mmHg LVOT Vmax:         91.00 cm/s LVOT Vmean:        53.300 cm/s LVOT VTI:          0.137 m LVOT/AV VTI ratio: 0.70  AORTA Ao Root diam: 3.00 cm MITRAL VALVE                TRICUSPID VALVE MV Area (PHT): 4.58 cm     TR Peak grad:   79.6 mmHg MV Area VTI:   2.05 cm     TR Vmax:        446.00 cm/s MV Peak grad:  4.8 mmHg MV Mean grad:  3.0 mmHg     SHUNTS MV Vmax:       1.10 m/s     Systemic VTI:  0.14 m MV Vmean:      75.2 cm/s    Systemic Diam: 1.90 cm MV Decel Time: 166 msec MV E velocity: 66.85 cm/s MV A velocity: 112.00 cm/s MV E/A ratio:  0.60 Debbe Odea MD Electronically signed by Debbe Odea MD Signature Date/Time: 07/19/2023/10:23:16 AM    Final    CT Angio Chest Pulmonary Embolism (PE) W or WO Contrast  Result Date: 07/18/2023 CLINICAL DATA:  Shortness of breath. Concern for pulmonary embolism. Recent surgery. EXAM: CT ANGIOGRAPHY CHEST WITH CONTRAST TECHNIQUE: Multidetector CT imaging of the chest was performed using the standard protocol during bolus administration of intravenous contrast. Multiplanar CT image reconstructions and MIPs were obtained to evaluate the vascular anatomy. RADIATION DOSE REDUCTION: This exam was performed according to the departmental dose-optimization program which includes automated exposure control, adjustment of the mA and/or kV according to patient size and/or use of iterative reconstruction technique. CONTRAST:  60mL OMNIPAQUE IOHEXOL 350 MG/ML SOLN COMPARISON:  Chest CT dated 03/01/2021. FINDINGS: Cardiovascular: There is no cardiomegaly or pericardial effusion. There is coronary vascular calcification involving the LAD and RCA. Mild atherosclerotic calcification of the aortic arch. No aneurysmal dilatation or dissection. The origins of the  great vessels of the aortic arch appear patent. No pulmonary artery embolus identified. Mediastinum/Nodes: No hilar or mediastinal adenopathy. The esophagus is grossly unremarkable no mediastinal fluid collection. Lungs/Pleura: No focal consolidation, pleural effusion, or pneumothorax. The central airways are patent. Upper Abdomen: Pneumoperitoneum in keeping with hysterectomy. Fatty liver. Musculoskeletal: No acute osseous pathology. Review of the MIP images confirms the above findings. IMPRESSION: 1. No acute intrathoracic pathology. No CT evidence of pulmonary embolism. 2. Pneumoperitoneum in keeping with hysterectomy. 3. Fatty liver. 4.  Aortic Atherosclerosis (ICD10-I70.0). Electronically Signed   By: Elgie Collard M.D.   On: 07/18/2023 20:08    Assessment and Plan  Abcde L Bardin is a 48 y.o. female with medical history significant of migraines, menorrhagia who presents to the hospital for scheduled hysterectomy, with consultation placed due to shortness of breath and  chest pain.  pt states that she had been experiencing some shortness of breath and subsequent development of chest tightness that occurs with exertion but she had assumed this is due to weight gain.  Then today after her surgery, she noted that she had persistent shortness of breath with some chest tightness.  She says she quit vaping approximately 6 weeks ago.  She denies any known history of asthma or COPD.  CT chest No acute intrathoracic pathology. No CT evidence of pulmonary embolism. 2. Pneumoperitoneum in keeping with hysterectomy. 3. Fatty liver. 4.  Aortic Atherosclerosis  Echo of the heart1. Left ventricular ejection fraction, by estimation, is 55 to 60%. The  left ventricle has normal function. The left ventricle has no regional  wall motion abnormalities. Left ventricular diastolic parameters are  consistent with Grade II diastolic  dysfunction (pseudonormalization). There is the interventricular septum is   flattened in systole and diastole, consistent with right ventricular  pressure and volume overload.   2. Right ventricular systolic function is mildly reduced. The right  ventricular size is moderately enlarged. There is severely elevated  pulmonary artery systolic pressure. The estimated right ventricular  systolic pressure is 82.6 mmHg.   3. The mitral valve is normal in structure. No evidence of mitral valve  regurgitation.   4. Tricuspid valve regurgitation is moderate to severe.   5. The aortic valve is tricuspid. Aortic valve regurgitation is not  visualized.   Acute hypoxic respiratory failure (HCC) with hypoxia suspected due to severe pulmonary hypertension/elevated right ventricular pressures and moderate to severe TR Chest tightness/pain --Patient presented today for scheduled hysterectomy, complicated by postoperative hypoxia into the 70s and 80s.  Patient difficult to maintain above 90 despite 3-4 L.  Given additional tachycardia and chest heaviness, -- CT chest negative for PE -- cardiology consultation with [facility] cardiology. Dr. Kirke Corin to see patient. -- Troponin's flat -- await further cardiology recommendations -- patient desaturated into the 80s. Continue 2 L nasal cannula oxygen. -- IV Lasix 20 mg daily. Monitor input output. -- Cardiac cath plan by cardiology for Monday.   Hepatic steatosis --Likely in the setting of obesity.   S/P hysterectomy Patient is postop day # 2 s/p laparoscopic vaginal hysterectomy with right salpingectomy.  - Oxycodone as needed for pain control - Management per OB/GYN -- abnormal UA with significant burning postop will treat empirically with PO Keflex   Leukocytosis Likely reactive due to surgery earlier today.  Anxiety -- PRN Xanax, will give scheduled dose of Vistaril for couple days.  Procedures: Family communication : none Consults : OB/GYN, [facility] cardiology CODE STATUS: full DVT Prophylaxis : Lovenox Level of care:  Progressive Status is: Inpatient Remains inpatient appropriate because: severe pulmonary hypertension,cath monday    TOTAL TIME TAKING CARE OF THIS PATIENT: 35 minutes.  >50% time spent on counselling and coordination of care  Note: This dictation was prepared with Dragon dictation along with smaller phrase technology. Any transcriptional errors that result from this process are unintentional.  Enedina Finner M.D    [facility]   CC: Primary care physician; Larena Glassman, [place]

## 2023-07-20 NOTE — Progress Notes (Addendum)
Gynecology Post-Op Note  Subjective  [name] is a 47 y.o. female [phone] who is POD2 s/p LAVH, right salpingectomy, and cystoscopy for AUB. Pt admitted post-op by hospitalist due to SOB, tachycardia, chest pain, and hypoxemia. Reviewed hospitalist and cardiology notes and results.   From a post op perspective she remains stable . Patient reports feeling pretty good overall. States she was surprised to learn that she has heart disease because it does not run in the family but also thankful that it was diagnosed. States cardiology is planning a right heart cath possibly on Monday. Vaginal bleeding minimal. Reports pain is controlled with medication- gets up to an 8 and decreases to a 2 after meds. Tolerating PO without n/v. Reports breathing feels better than before. No longer SOB in bed but still SOB with ambulation. Continues to have chest heaviness. Voiding and ambulating without difficulty. Passing flatus. No BM.    Objective   BP 109/77 (BP Location: Left Arm)   Pulse 98   Temp 97.7 F (36.5 C)   Resp 17   Ht 5\' 3"  (1.6 m)   Wt 74.4 kg   SpO2 91%   BMI 29.06 kg/m    Intake/Output Summary (Last 24 hours) at 07/20/2023 0818 Last data filed at 07/20/2023 0500 Gross per 24 hour  Intake 720 ml  Output 2450 ml  Net -1730 ml    Gen: NAD HEENT: [place]/AT CV: Tachycardic, regular rhythm PULM: CTAB, slightly increased work of breathing on 4L [place] ABD: BS +, soft, appropriately tender, nondistended, no rebound or guarding INCISION: 3 lap sites c/d/i, no surrounding erythema EXT: No BLE edema, calves symmetric and non-tender Pelvic exam: deferred   Labs:  Recent Labs    07/18/23 1639 07/18/23 2131 07/19/23 0103 07/20/23 0541  WBC 13.4*  --  12.5* 11.5*  HGB 12.8   < > 11.8* 11.9*  HCT 38.3   < > 34.6* 36.5  MCV 90.1  --  90.1 94.3  PLT 225  --  193 233   < > = values in this interval not displayed.    Recent Labs    07/18/23 1639 07/18/23 1949 07/19/23 0103  07/20/23 0541  NA 136  --  135 139  K 4.3  --  4.4 3.9  CL 104  --  103 105  CO2 18*  --  21* 25  CREATININE 0.85  --  0.75 0.79  BUN 9  --  8 19  CALCIUM 8.3*  --  8.2* 8.6*  ALT  --  37  --   --   AST  --  35  --   --      Assessment   [name] is a 47 y.o. [id number] who is POD2 s/p LAVH, right salpingectomy, and cystoscopy for AUB. Patient was admitted post-op by hospitalist due to SOB, tachycardia, chest pain, and hypoxemia with new oxygen requirement. Work-up negative for acute PE but significant for significant underlying cardiac disease including RV dysfunction with severe pulmonary hypertension and tricuspid regurgitation for which cardiology has been consulted. Patient is doing well from a post-op perspective but remains admitted for newly diagnosed cardiac disease.   Plan   #Post-op - Hb stable: 14.2 (pre-op) > 12.8 post-op > 12.7 > 11.8 > 11.9 this AM - Cr stable: 0.64 pre-op > 0.85 >> 0.79 this AM - Remains on 4L [place] - Ambulating and voiding spontaneously without difficulty  - Bowel regimen: daily Miralax  - Pain: scheduled tylenol, PRN  oxycodone. Ok with ibuprofen from a surgery perspective.  - Nausea: PRN ondansetron - Healing well and clear for discharge from a post-op perspective  Remainder of care per primary team.   Romana Juniper, MD

## 2023-07-20 NOTE — Progress Notes (Signed)
Progress Note  Patient Name: [name] Date of Encounter: 07/20/2023  Primary Cardiologist: New - consult by Kirke Corin   Subjective   Dyspnea largely unchanged, though has not been out of bed much. No frank chest pain. Documented UOP 1.7 L for the past 24 hours, net + 1.7 L for the admission to date. Renal function stable on IV Lasix. Lactic acid improved.   Inpatient Medications    Scheduled Meds:  acetaminophen  650 mg Oral Q8H   cephALEXin  500 mg Oral Q12H   enoxaparin (LOVENOX) injection  40 mg Subcutaneous Q24H   furosemide  20 mg Intravenous Daily   hydrOXYzine  25 mg Oral TID   metoprolol tartrate  12.5 mg Oral BID   phenazopyridine  200 mg Oral TID WC   polyethylene glycol  17 g Oral Daily   potassium chloride  20 mEq Oral BID   Continuous Infusions:  PRN Meds: ALPRAZolam, docusate sodium, levalbuterol, nitroGLYCERIN, ondansetron (ZOFRAN) IV, ondansetron, oxyCODONE   Vital Signs    Vitals:   07/19/23 2010 07/20/23 0006 07/20/23 0349 07/20/23 0827  BP: 122/85 93/69 96/71  109/77  Pulse: 97 97 86 98  Resp: 16 18 15 17   Temp: 97.9 F (36.6 C) 98.1 F (36.7 C) 97.8 F (36.6 C) 97.7 F (36.5 C)  TempSrc: Oral Oral Oral   SpO2: 97% 92% 98% 91%  Weight:      Height:        Intake/Output Summary (Last 24 hours) at 07/20/2023 1049 Last data filed at 07/20/2023 1018 Gross per 24 hour  Intake 600 ml  Output 3200 ml  Net -2600 ml   Filed Weights   07/18/23 0631  Weight: 74.4 kg    Telemetry    SR - Personally Reviewed  ECG    No new tracings - Personally Reviewed  Physical Exam   GEN: No acute distress.   Neck: JVD elevated ~ 6 cm. Cardiac: RRR, II/VI systolic murmur LSB, no rubs, or gallops.  Respiratory: Clear to auscultation bilaterally.  GI: Soft, nontender, mildly distended.   MS: Mild lower extremity edema; No deformity. Neuro:  Alert and oriented x 3; Nonfocal.  Psych: Normal affect.  Labs    Chemistry Recent Labs  Lab  07/18/23 1639 07/18/23 1949 07/19/23 0103 07/20/23 0541  NA 136  --  135 139  K 4.3  --  4.4 3.9  CL 104  --  103 105  CO2 18*  --  21* 25  GLUCOSE 168*  --  178* 106*  BUN 9  --  8 19  CREATININE 0.85  --  0.75 0.79  CALCIUM 8.3*  --  8.2* 8.6*  PROT  --  6.6  --   --   ALBUMIN  --  3.6  --   --   AST  --  35  --   --   ALT  --  37  --   --   ALKPHOS  --  50  --   --   BILITOT  --  0.8  --   --   GFRNONAA >60  --  >60 >60  ANIONGAP 14  --  11 9     Hematology Recent Labs  Lab 07/18/23 1639 07/18/23 2131 07/19/23 0103 07/20/23 0541  WBC 13.4*  --  12.5* 11.5*  RBC 4.25  --  3.84* 3.87  HGB 12.8 12.7 11.8* 11.9*  HCT 38.3 37.1 34.6* 36.5  MCV 90.1  --  90.1 94.3  MCH 30.1  --  30.7 30.7  MCHC 33.4  --  34.1 32.6  RDW 13.4  --  13.5 14.1  PLT 225  --  193 233    Cardiac EnzymesNo results for input(s): "TROPONINI" in the last 168 hours. No results for input(s): "TROPIPOC" in the last 168 hours.   BNP Recent Labs  Lab 07/19/23 1542  BNP 470.4*     DDimer No results for input(s): "DDIMER" in the last 168 hours.   Radiology    CT Angio Chest Pulmonary Embolism (PE) W or WO Contrast  Result Date: 07/18/2023 IMPRESSION: 1. No acute intrathoracic pathology. No CT evidence of pulmonary embolism. 2. Pneumoperitoneum in keeping with hysterectomy. 3. Fatty liver. 4.  Aortic Atherosclerosis (ICD10-I70.0). Electronically Signed   By: Elgie Collard M.D.   On: 07/18/2023 20:08   DG Chest Port 1 View  Result Date: 07/18/2023 IMPRESSION: No active disease. Electronically Signed   By: Tish Frederickson M.D.   On: 07/18/2023 14:09    Cardiac Studies   2D echo 07/19/2023: 1. Left ventricular ejection fraction, by estimation, is 55 to 60%. The  left ventricle has normal function. The left ventricle has no regional  wall motion abnormalities. Left ventricular diastolic parameters are  consistent with Grade II diastolic  dysfunction (pseudonormalization). There is the  interventricular septum is  flattened in systole and diastole, consistent with right ventricular  pressure and volume overload.   2. Right ventricular systolic function is mildly reduced. The right  ventricular size is moderately enlarged. There is severely elevated  pulmonary artery systolic pressure. The estimated right ventricular  systolic pressure is 82.6 mmHg.   3. The mitral valve is normal in structure. No evidence of mitral valve  regurgitation.   4. Tricuspid valve regurgitation is moderate to severe.   5. The aortic valve is tricuspid. Aortic valve regurgitation is not  visualized.  __________   Limited echo 03/02/2021: 1. Left ventricular ejection fraction, by estimation, is 60 to 65%. The  left ventricle has normal function. The left ventricle has no regional  wall motion abnormalities. Left ventricular diastolic parameters are  indeterminate.   2. Right ventricular systolic function is normal. The right ventricular  size is normal. There is mildly elevated pulmonary artery systolic  pressure. The estimated right ventricular systolic pressure is 36.4 mmHg.   3. The mitral valve is normal in structure. No evidence of mitral valve  regurgitation. No evidence of mitral stenosis.   4. Tricuspid valve regurgitation is mild to moderate.   Patient Profile     47 y.o. female with history of prior tobacco use quitting in approximately 2019 with prior vape use quitting approximately 6 weeks ago, migraines, and menorrhagia who presented to the hospital on 07/18/2023 for scheduled hysterectomy complicated by postoperative hypoxia, who is being seen today for the evaluation of RV failure with severe pulmonary hypertension and moderate to severe tricuspid regurgitation at the request of Dr. Allena Katz.   Assessment & Plan    1. RV dysfunction with severe pulmonary hypertension and tricuspid regurgitation:  -Continue IV Lasix 20 mg daily with KCl repletion  -CTA negative for acute PE, PNA,  or acute thoracic process  -Ruled out  -Lactic acid improved, now normal -EKG with new anterolateral st/t changes  -Will need VQ scan in the near future to evaluate for chronic PE  -Plan for [facility] following optimization of volume status, likely early next week, followed by further escalation of medical therapy as indicated -Patient will need  advanced heart failure evaluation this admission  -Strict I/O  -Daily standing scale weights -Will need close monitoring of renal function   For questions or updates, please contact [facility] Please consult [email or web address] for contact info under Cardiology/STEMI.    Signed, Eula Listen, PA-C [facility] Pager: [phone] 07/20/2023, 10:49 AM

## 2023-07-21 DIAGNOSIS — R000 Tachycardia, unspecified: Secondary | ICD-10-CM | POA: Diagnosis not present

## 2023-07-21 DIAGNOSIS — I272 Pulmonary hypertension, unspecified: Secondary | ICD-10-CM | POA: Diagnosis not present

## 2023-07-21 DIAGNOSIS — K760 Fatty (change of) liver, not elsewhere classified: Secondary | ICD-10-CM | POA: Diagnosis not present

## 2023-07-21 DIAGNOSIS — J9601 Acute respiratory failure with hypoxia: Secondary | ICD-10-CM | POA: Diagnosis not present

## 2023-07-21 LAB — BASIC METABOLIC PANEL
Anion gap: 9 (ref 5–15)
BUN: 20 mg/dL (ref 6–20)
CO2: 25 mmol/L (ref 22–32)
Calcium: 8.2 mg/dL — ABNORMAL LOW (ref 8.9–10.3)
Chloride: 105 mmol/L (ref 98–111)
Creatinine, Ser: 0.8 mg/dL (ref 0.44–1.00)
GFR, Estimated: >60 mL/min (ref >60)
Glucose, Bld: 106 mg/dL — ABNORMAL HIGH (ref 70–99)
Potassium: 3.9 mmol/L (ref 3.5–5.1)
Sodium: 139 mmol/L (ref 135–145)

## 2023-07-21 MED ORDER — ASPIRIN 81 MG PO CHEW
81.0000 mg | CHEWABLE_TABLET | ORAL | Status: AC
  Administered 2023-07-22: 81 mg via ORAL
  Filled 2023-07-21: qty 1

## 2023-07-21 MED ORDER — SODIUM CHLORIDE 0.9 % IV SOLN: INTRAVENOUS | Status: DC

## 2023-07-21 MED ORDER — SENNA 8.6 MG PO TABS
1.0000 | ORAL_TABLET | Freq: Every day | ORAL | Status: DC
  Administered 2023-07-21: 8.6 mg via ORAL
  Filled 2023-07-21: qty 1

## 2023-07-21 NOTE — Progress Notes (Signed)
Gynecology Post-Op Note  Subjective  [name] is a 47 y.o. female [phone] who is POD3 s/p LAVH, right salpingectomy, and cystoscopy for AUB. Pt admitted post-op by hospitalist due to SOB, tachycardia, chest pain, and hypoxemia.   Reviewed hospitalist and cardiology notes and results.   From a post op perspective she remains stable. Vaginal bleeding remains minimal. Reports abdominal pain improved today- currently a 2/10. Tolerating PO without n/v. Reports SOB is unchanged from yesterday. Has not walked because she is scared about feeling more SOB. Denies burning with urination or other urinary symptoms. Voiding without difficulty. Passing flatus. No BM.   Objective   BP 117/88 (BP Location: Left Arm)   Pulse 81   Temp 98 F (36.7 C)   Resp 16   Ht 5\' 3"  (1.6 m)   Wt 74.4 kg   SpO2 92%   BMI 29.06 kg/m    Intake/Output Summary (Last 24 hours) at 07/21/2023 0827 Last data filed at 07/21/2023 0532 Gross per 24 hour  Intake 711 ml  Output 1050 ml  Net -339 ml    Gen: NAD HEENT: [place]/AT CV: RRR PULM: CTAB, breathing comfortably on 4L [place] ABD: BS +, soft, minimally tender, slightly distended, no rebound or guarding INCISION: 3 lap sites c/d/i, no surrounding erythema EXT: No BLE edema, calves symmetric and non-tender Pelvic exam: deferred   Labs:  Recent Labs    07/18/23 1639 07/18/23 2131 07/19/23 0103 07/20/23 0541  WBC 13.4*  --  12.5* 11.5*  HGB 12.8   < > 11.8* 11.9*  HCT 38.3   < > 34.6* 36.5  MCV 90.1  --  90.1 94.3  PLT 225  --  193 233   < > = values in this interval not displayed.    Recent Labs    07/18/23 1639 07/18/23 1949 07/19/23 0103 07/20/23 0541  NA 136  --  135 139  K 4.3  --  4.4 3.9  CL 104  --  103 105  CO2 18*  --  21* 25  CREATININE 0.85  --  0.75 0.79  BUN 9  --  8 19  CALCIUM 8.3*  --  8.2* 8.6*  ALT  --  37  --   --   AST  --  35  --   --      Assessment   [name] is a 47 y.o. [id number] F who is POD3 s/p LAVH, right  salpingectomy, and cystoscopy for AUB. Patient was admitted post-op by hospitalist due to SOB, tachycardia, chest pain, and hypoxemia with new oxygen requirement. Work-up negative for acute PE but significant for significant underlying cardiac disease including RV dysfunction with severe pulmonary hypertension and tricuspid regurgitation for which cardiology has been consulted. Patient is doing well from a post-op perspective but remains admitted for newly diagnosed cardiac disease. Per cardiology, patient is receiving IV furosemide for diuresis and tentatively planned for right heart cath tomorrow.   Plan   #Post-op - Hb stable: 14.2 (pre-op) > 12.8 post-op > 12.7 > 11.8 > 11.9 yesterday AM - Cr stable: 0.64 pre-op > 0.85 >>0.8 this AM - Remains on 4L [place] - Ambulating and voiding spontaneously without difficulty. Discussed the importance of ambulating. - Bowel regimen: daily Miralax and senna - Pain: scheduled tylenol, PRN oxycodone. Ok with ibuprofen from a surgery perspective.  - Nausea: PRN ondansetron - Low suspicion for post-op UTI based on lack of sx and normal UA - Healing well and clear for  discharge from a post-op perspective  Remainder of care per primary team.   Romana Juniper, MD

## 2023-07-21 NOTE — Progress Notes (Signed)
[facility]  - [place] at [facility]   PATIENT NAME: [name]    MR#:  [id number]  DATE OF BIRTH:  03/19/1976  SUBJECTIVE:  no family at bedside. Overall feels somewhat better. Will d/c purwick   VITALS:  Blood pressure 119/78, pulse 86, temperature (!) 97.5 F (36.4 C), resp. rate 18, height 5\' 3"  (1.6 m), weight 74.4 kg, SpO2 93%.  PHYSICAL EXAMINATION:   GENERAL:  47 y.o.-year-old patient with no acute distress. Obese LUNGS: Normal breath sounds bilaterally, no wheezing CARDIOVASCULAR: S1, S2 normal. No murmur, tachycardia ABDOMEN: Soft, nontender, nondistended. Bowel sounds present.  EXTREMITIES: No  edema b/l.    NEUROLOGIC: nonfocal  patient is alert and awake SKIN: No obvious rash, lesion, or ulcer.   LABORATORY PANEL:  CBC Recent Labs  Lab 07/20/23 0541  WBC 11.5*  HGB 11.9*  HCT 36.5  PLT 233    Chemistries  Recent Labs  Lab 07/18/23 1949 07/19/23 0103 07/21/23 0739  NA  --    < > 139  K  --    < > 3.9  CL  --    < > 105  CO2  --    < > 25  GLUCOSE  --    < > 106*  BUN  --    < > 20  CREATININE  --    < > 0.80  CALCIUM  --    < > 8.2*  AST 35  --   --   ALT 37  --   --   ALKPHOS 50  --   --   BILITOT 0.8  --   --    < > = values in this interval not displayed.   Cardiac Enzymes No results for input(s): "TROPONINI" in the last 168 hours. RADIOLOGY:  No results found.  Assessment and Plan  Anderson L Clayman is a 47 y.o. female with medical history significant of migraines, menorrhagia who presents to the hospital for scheduled hysterectomy, with consultation placed due to shortness of breath and chest pain.  pt states that she had been experiencing some shortness of breath and subsequent development of chest tightness that occurs with exertion but she had assumed this is due to weight gain.  Then today after her surgery, she noted that she had persistent shortness of breath with some chest tightness.  She says she quit vaping  approximately 6 weeks ago.  She denies any known history of asthma or COPD.  CT chest No acute intrathoracic pathology. No CT evidence of pulmonary embolism. 2. Pneumoperitoneum in keeping with hysterectomy. 3. Fatty liver. 4.  Aortic Atherosclerosis  Echo of the heart1. Left ventricular ejection fraction, by estimation, is 55 to 60%. The  left ventricle has normal function. The left ventricle has no regional  wall motion abnormalities. Left ventricular diastolic parameters are  consistent with Grade II diastolic  dysfunction (pseudonormalization). There is the interventricular septum is  flattened in systole and diastole, consistent with right ventricular  pressure and volume overload.   2. Right ventricular systolic function is mildly reduced. The right  ventricular size is moderately enlarged. There is severely elevated  pulmonary artery systolic pressure. The estimated right ventricular  systolic pressure is 82.6 mmHg.   3. The mitral valve is normal in structure. No evidence of mitral valve  regurgitation.   4. Tricuspid valve regurgitation is moderate to severe.   5. The aortic valve is tricuspid. Aortic valve regurgitation is not  visualized.   Acute  hypoxic respiratory failure (HCC) with hypoxia suspected due to severe pulmonary hypertension/elevated right ventricular pressures and moderate to severe TR Chest tightness/pain --Patient presented today for scheduled hysterectomy, complicated by postoperative hypoxia into the 70s and 80s.  Patient difficult to maintain above 90 despite 3-4 L.  Given additional tachycardia and chest heaviness, -- CT chest negative for PE -- cardiology consultation with [facility] cardiology. Dr. Kirke Corin to see patient. -- Troponin's flat -- await further cardiology recommendations -- patient desaturated into the 80s. Continue 2 L nasal cannula oxygen. -- IV Lasix 20 mg daily. Monitor input output. -- Cardiac cath plan by cardiology for Monday.    Hepatic steatosis --Likely in the setting of obesity.   S/P hysterectomy Patient is postop day # 2 s/p laparoscopic vaginal hysterectomy with right salpingectomy.  - Oxycodone as needed for pain control - Management per OB/GYN -- abnormal UA with significant burning postop will treat empirically with PO Keflex   Leukocytosis Likely reactive due to surgery earlier today.  Anxiety -- PRN Xanax, will give scheduled dose of Vistaril for couple days.  Procedures: Family communication : none Consults : OB/GYN, [facility] cardiology CODE STATUS: full DVT Prophylaxis : Lovenox Level of care: Progressive Status is: Inpatient Remains inpatient appropriate because: severe pulmonary hypertension,cath monday    TOTAL TIME TAKING CARE OF THIS PATIENT: 35 minutes.  >50% time spent on counselling and coordination of care  Note: This dictation was prepared with Dragon dictation along with smaller phrase technology. Any transcriptional errors that result from this process are unintentional.  Enedina Finner M.D    [facility]   CC: Primary care physician; Larena Glassman, [place]

## 2023-07-21 NOTE — Progress Notes (Signed)
Progress Note  Patient Name: [name] Date of Encounter: 07/21/2023  Primary Cardiologist: New - consult by Kirke Corin   Subjective   Dyspnea largely unchanged, though has not been out of bed much. Continues with chest heaviness. Documented UOP 340 mL for the past 24 hours, net + 2.3 L for the admission to date.  Labs pending this morning. Now on pyridium.  Inpatient Medications    Scheduled Meds:  acetaminophen  650 mg Oral Q8H   cephALEXin  500 mg Oral Q12H   enoxaparin (LOVENOX) injection  40 mg Subcutaneous Q24H   furosemide  20 mg Intravenous Daily   hydrOXYzine  25 mg Oral TID   metoprolol tartrate  12.5 mg Oral BID   phenazopyridine  200 mg Oral TID WC   polyethylene glycol  17 g Oral Daily   potassium chloride  20 mEq Oral BID   Continuous Infusions:  PRN Meds: ALPRAZolam, docusate sodium, levalbuterol, nitroGLYCERIN, ondansetron (ZOFRAN) IV, ondansetron, oxyCODONE   Vital Signs    Vitals:   07/20/23 1946 07/20/23 2038 07/21/23 0006 07/21/23 0442  BP: (!) 120/92 107/76 104/73 112/87  Pulse: 98 94 94 83  Resp: 18 15 18 18   Temp: 98.1 F (36.7 C) 98 F (36.7 C) 98.1 F (36.7 C) 97.8 F (36.6 C)  TempSrc: Oral Oral Oral Oral  SpO2: 92% 92% 93% 92%  Weight:      Height:        Intake/Output Summary (Last 24 hours) at 07/21/2023 0727 Last data filed at 07/21/2023 0532 Gross per 24 hour  Intake 711 ml  Output 1050 ml  Net -339 ml   Filed Weights   07/18/23 0631  Weight: 74.4 kg    Telemetry    SR, 60s to 90s bpm - Personally Reviewed  ECG    No new tracings - Personally Reviewed  Physical Exam   GEN: No acute distress.   Neck: JVD elevated ~ 6 cm. Cardiac: RRR, II/VI systolic murmur LSB, no rubs, or gallops.  Respiratory: Clear to auscultation bilaterally.  GI: Soft, nontender, mildly distended.   MS: Mild lower extremity edema; No deformity. Neuro:  Alert and oriented x 3; Nonfocal.  Psych: Normal affect.  Labs    Chemistry Recent  Labs  Lab 07/18/23 1639 07/18/23 1949 07/19/23 0103 07/20/23 0541  NA 136  --  135 139  K 4.3  --  4.4 3.9  CL 104  --  103 105  CO2 18*  --  21* 25  GLUCOSE 168*  --  178* 106*  BUN 9  --  8 19  CREATININE 0.85  --  0.75 0.79  CALCIUM 8.3*  --  8.2* 8.6*  PROT  --  6.6  --   --   ALBUMIN  --  3.6  --   --   AST  --  35  --   --   ALT  --  37  --   --   ALKPHOS  --  50  --   --   BILITOT  --  0.8  --   --   GFRNONAA >60  --  >60 >60  ANIONGAP 14  --  11 9     Hematology Recent Labs  Lab 07/18/23 1639 07/18/23 2131 07/19/23 0103 07/20/23 0541  WBC 13.4*  --  12.5* 11.5*  RBC 4.25  --  3.84* 3.87  HGB 12.8 12.7 11.8* 11.9*  HCT 38.3 37.1 34.6* 36.5  MCV 90.1  --  90.1 94.3  MCH 30.1  --  30.7 30.7  MCHC 33.4  --  34.1 32.6  RDW 13.4  --  13.5 14.1  PLT 225  --  193 233    Cardiac EnzymesNo results for input(s): "TROPONINI" in the last 168 hours. No results for input(s): "TROPIPOC" in the last 168 hours.   BNP Recent Labs  Lab 07/19/23 1542  BNP 470.4*     DDimer No results for input(s): "DDIMER" in the last 168 hours.   Radiology    CT Angio Chest Pulmonary Embolism (PE) W or WO Contrast  Result Date: 07/18/2023 IMPRESSION: 1. No acute intrathoracic pathology. No CT evidence of pulmonary embolism. 2. Pneumoperitoneum in keeping with hysterectomy. 3. Fatty liver. 4.  Aortic Atherosclerosis (ICD10-I70.0). Electronically Signed   By: Elgie Collard M.D.   On: 07/18/2023 20:08   DG Chest Port 1 View  Result Date: 07/18/2023 IMPRESSION: No active disease. Electronically Signed   By: Tish Frederickson M.D.   On: 07/18/2023 14:09    Cardiac Studies   2D echo 07/19/2023: 1. Left ventricular ejection fraction, by estimation, is 55 to 60%. The  left ventricle has normal function. The left ventricle has no regional  wall motion abnormalities. Left ventricular diastolic parameters are  consistent with Grade II diastolic  dysfunction (pseudonormalization). There  is the interventricular septum is  flattened in systole and diastole, consistent with right ventricular  pressure and volume overload.   2. Right ventricular systolic function is mildly reduced. The right  ventricular size is moderately enlarged. There is severely elevated  pulmonary artery systolic pressure. The estimated right ventricular  systolic pressure is 82.6 mmHg.   3. The mitral valve is normal in structure. No evidence of mitral valve  regurgitation.   4. Tricuspid valve regurgitation is moderate to severe.   5. The aortic valve is tricuspid. Aortic valve regurgitation is not  visualized.  __________   Limited echo 03/02/2021: 1. Left ventricular ejection fraction, by estimation, is 60 to 65%. The  left ventricle has normal function. The left ventricle has no regional  wall motion abnormalities. Left ventricular diastolic parameters are  indeterminate.   2. Right ventricular systolic function is normal. The right ventricular  size is normal. There is mildly elevated pulmonary artery systolic  pressure. The estimated right ventricular systolic pressure is 36.4 mmHg.   3. The mitral valve is normal in structure. No evidence of mitral valve  regurgitation. No evidence of mitral stenosis.   4. Tricuspid valve regurgitation is mild to moderate.   Patient Profile     47 y.o. female with history of prior tobacco use quitting in approximately 2019 with prior vape use quitting approximately 6 weeks ago, migraines, and menorrhagia who presented to the hospital on 07/18/2023 for scheduled hysterectomy complicated by postoperative hypoxia, who is being seen today for the evaluation of RV failure with severe pulmonary hypertension and moderate to severe tricuspid regurgitation at the request of Dr. Allena Katz.   Assessment & Plan    1. RV dysfunction with severe pulmonary hypertension and tricuspid regurgitation:  -Continue IV Lasix 20 mg daily with KCl repletion -Labs pending -CTA negative  for acute PE, PNA, or acute thoracic process  -Ruled out  -Lactic acid improved, now normal -EKG with new anterolateral st/t changes  -Will need VQ scan in the near future to evaluate for chronic PE  -NPO at midnight, sips with meds -Plan for [facility] on 9/16, followed by further escalation of medical therapy as indicated -  Patient will need advanced heart failure evaluation this admission  -Strict I/O  -Daily standing scale weights -Will need close monitoring of renal function   For questions or updates, please contact [facility] Please consult [email or web address] for contact info under Cardiology/STEMI.    Signed, Eula Listen, PA-C [facility] Pager: [phone] 07/21/2023, 7:27 AM

## 2023-07-21 NOTE — Plan of Care (Signed)

## 2023-07-22 ENCOUNTER — Encounter
Admission: RE | Disposition: A | Discharge status: Home / Self Care | Payer: Self-pay | Source: Home / Self Care | Attending: Internal Medicine

## 2023-07-22 ENCOUNTER — Encounter: Payer: Self-pay | Admitting: Oncology

## 2023-07-22 ENCOUNTER — Other Ambulatory Visit (HOSPITAL_COMMUNITY): Payer: Self-pay

## 2023-07-22 DIAGNOSIS — R000 Tachycardia, unspecified: Secondary | ICD-10-CM | POA: Diagnosis not present

## 2023-07-22 DIAGNOSIS — I50810 Right heart failure, unspecified: Secondary | ICD-10-CM | POA: Diagnosis not present

## 2023-07-22 DIAGNOSIS — I272 Pulmonary hypertension, unspecified: Secondary | ICD-10-CM | POA: Diagnosis not present

## 2023-07-22 DIAGNOSIS — J9601 Acute respiratory failure with hypoxia: Secondary | ICD-10-CM | POA: Diagnosis not present

## 2023-07-22 DIAGNOSIS — K760 Fatty (change of) liver, not elsewhere classified: Secondary | ICD-10-CM | POA: Diagnosis not present

## 2023-07-22 HISTORY — PX: RIGHT/LEFT HEART CATH AND CORONARY ANGIOGRAPHY: CATH118266

## 2023-07-22 LAB — POCT I-STAT EG7
Acid-Base Excess: 4 mmol/L — ABNORMAL HIGH (ref 0.0–2.0)
Bicarbonate: 28.6 mmol/L — ABNORMAL HIGH (ref 20.0–28.0)
Calcium, Ion: 1.13 mmol/L — ABNORMAL LOW (ref 1.15–1.40)
HCT: 40 % (ref 36.0–46.0)
Hemoglobin: 13.6 g/dL (ref 12.0–15.0)
O2 Saturation: 59 %
Potassium: 4 mmol/L (ref 3.5–5.1)
Sodium: 137 mmol/L (ref 135–145)
TCO2: 30 mmol/L (ref 22–32)
pCO2, Ven: 43.9 mmHg — ABNORMAL LOW (ref 44–60)
pH, Ven: 7.422 (ref 7.25–7.43)
pO2, Ven: 30 mmHg — CL (ref 32–45)

## 2023-07-22 LAB — SEDIMENTATION RATE: Sed Rate: 8 mm/h (ref 0–20)

## 2023-07-22 LAB — FERRITIN: Ferritin: 91 ng/mL (ref 11–307)

## 2023-07-22 SURGERY — RIGHT/LEFT HEART CATH AND CORONARY ANGIOGRAPHY
Anesthesia: Moderate Sedation

## 2023-07-22 MED ORDER — HEPARIN (PORCINE) IN NACL 2000-0.9 UNIT/L-% IV SOLN
INTRAVENOUS | Status: DC | PRN
  Administered 2023-07-22: 1000 mL

## 2023-07-22 MED ORDER — SODIUM CHLORIDE 0.9 % IV SOLN: INTRAVENOUS | Status: AC

## 2023-07-22 MED ORDER — SODIUM CHLORIDE 0.9 % IV SOLN: 250.0000 mL | INTRAVENOUS | Status: DC | PRN

## 2023-07-22 MED ORDER — SODIUM CHLORIDE 0.9% FLUSH: 3.0000 mL | INTRAVENOUS | Status: DC | PRN

## 2023-07-22 MED ORDER — VERAPAMIL HCL 2.5 MG/ML IV SOLN
INTRAVENOUS | Status: AC
  Filled 2023-07-22: qty 2

## 2023-07-22 MED ORDER — ENOXAPARIN SODIUM 40 MG/0.4ML IJ SOSY
40.0000 mg | PREFILLED_SYRINGE | INTRAMUSCULAR | Status: DC
  Administered 2023-07-23 – 2023-07-24 (×2): 40 mg via SUBCUTANEOUS
  Filled 2023-07-22 (×4): qty 0.4

## 2023-07-22 MED ORDER — FENTANYL CITRATE (PF) 100 MCG/2ML IJ SOLN
INTRAMUSCULAR | Status: AC
  Filled 2023-07-22: qty 2

## 2023-07-22 MED ORDER — ACETAMINOPHEN 325 MG PO TABS: 650.0000 mg | ORAL_TABLET | ORAL | Status: DC | PRN

## 2023-07-22 MED ORDER — ORAL CARE MOUTH RINSE: 15.0000 mL | OROMUCOSAL | Status: DC | PRN

## 2023-07-22 MED ORDER — MIDAZOLAM HCL 2 MG/2ML IJ SOLN
INTRAMUSCULAR | Status: DC | PRN
  Administered 2023-07-22: 1 mg via INTRAVENOUS

## 2023-07-22 MED ORDER — IOHEXOL 300 MG/ML  SOLN
INTRAMUSCULAR | Status: DC | PRN
  Administered 2023-07-22: 45 mL

## 2023-07-22 MED ORDER — VERAPAMIL HCL 2.5 MG/ML IV SOLN
INTRAVENOUS | Status: DC | PRN
  Administered 2023-07-22: 2.5 mg via INTRA_ARTERIAL

## 2023-07-22 MED ORDER — HEPARIN SODIUM (PORCINE) 1000 UNIT/ML IJ SOLN
INTRAMUSCULAR | Status: DC | PRN
  Administered 2023-07-22: 3500 [IU] via INTRAVENOUS

## 2023-07-22 MED ORDER — FENTANYL CITRATE (PF) 100 MCG/2ML IJ SOLN
INTRAMUSCULAR | Status: DC | PRN
  Administered 2023-07-22: 25 ug via INTRAVENOUS

## 2023-07-22 MED ORDER — HEPARIN (PORCINE) IN NACL 1000-0.9 UT/500ML-% IV SOLN
INTRAVENOUS | Status: AC
  Filled 2023-07-22: qty 1000

## 2023-07-22 MED ORDER — HEPARIN SODIUM (PORCINE) 1000 UNIT/ML IJ SOLN
INTRAMUSCULAR | Status: AC
  Filled 2023-07-22: qty 10

## 2023-07-22 MED ORDER — ONDANSETRON HCL 4 MG/2ML IJ SOLN: 4.0000 mg | Freq: Four times a day (QID) | INTRAMUSCULAR | Status: DC | PRN

## 2023-07-22 MED ORDER — MIDAZOLAM HCL 2 MG/2ML IJ SOLN
INTRAMUSCULAR | Status: AC
  Filled 2023-07-22: qty 2

## 2023-07-22 MED ORDER — HYDRALAZINE HCL 20 MG/ML IJ SOLN: 10.0000 mg | INTRAMUSCULAR | Status: AC | PRN

## 2023-07-22 MED ORDER — SODIUM CHLORIDE 0.9% FLUSH
3.0000 mL | Freq: Two times a day (BID) | INTRAVENOUS | Status: DC
  Administered 2023-07-22 – 2023-07-24 (×4): 3 mL via INTRAVENOUS

## 2023-07-22 MED ORDER — DIGOXIN 125 MCG PO TABS
0.1250 mg | ORAL_TABLET | Freq: Every day | ORAL | Status: DC
  Administered 2023-07-22: 0.125 mg via ORAL
  Filled 2023-07-22 (×2): qty 1

## 2023-07-22 SURGICAL SUPPLY — 15 items
CANNULA 5F STIFF (CANNULA) IMPLANT
CATH 5FR JL3.5 JR4 ANG PIG MP (CATHETERS) IMPLANT
CATH SWAN GANZ 7F STRAIGHT (CATHETERS) IMPLANT
DEVICE RAD TR BAND REGULAR (VASCULAR PRODUCTS) IMPLANT
DRAPE BRACHIAL (DRAPES) IMPLANT
GLIDESHEATH SLEND SS 6F .021 (SHEATH) IMPLANT
GLIDESHEATH SLENDER 7FR .021G (SHEATH) IMPLANT
GUIDEWIRE EMER 3M J .025X150CM (WIRE) IMPLANT
GUIDEWIRE INQWIRE 1.5J.035X260 (WIRE) IMPLANT
INQWIRE 1.5J .035X260CM (WIRE) ×1
PACK CARDIAC CATH (CUSTOM PROCEDURE TRAY) IMPLANT
PROTECTION STATION PRESSURIZED (MISCELLANEOUS) ×1
SET ATX-X65L (MISCELLANEOUS) IMPLANT
SHEATH GLIDE SLENDER 4/5FR (SHEATH) IMPLANT
STATION PROTECTION PRESSURIZED (MISCELLANEOUS) IMPLANT

## 2023-07-22 NOTE — H&P (View-Only) (Signed)
Advanced Heart Failure Team Consult Note   Primary Physician: Larena Glassman, PA PCP-Cardiologist:  None  Reason for Consultation: Dr. Kirke Corin  HPI:    [name] is seen today for evaluation of RV failure at the request of Dr. Kirke Corin.   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   Says she has had sinus tach for years. Echo 4/22 Normal LV/RV RVSP estimated at . Over past year has had progressive DOE with LE extremity edema and bloating. Occasional chest tightness. Occasional pre-syncope but no syncope.   Underwent elective hysterectomy on 07/18/23. Post procedure became hypoxic with oxygen levels briefly trending into the 70s to 80s% with associated near syncope on walking the halls.  High-sensitivity troponin negative x 2.  COVID and respiratory panel-negative.  Lactic acid 4.9 trending to 3.7.  Chest x-ray without acute cardiopulmonary process.  CTA chest negative for PE with pneumoperitoneum in the setting of hysterectomy, fatty liver, and aortic atherosclerosis.  Echo showed an EF of 55 to 60%, no regional wall motion abnormalities, grade 2 diastolic dysfunction, there was interventricular septum flattening in systole and diastole consistent with RV pressure and volume overload, mildly reduced RV systolic function with moderately large ventricular cavity with severely elevated RVSP estimated 82.6 mmHg, and a moderate to severe tricuspid regurgitation.   Now feeling better but still edematous. + DOE with mild exertion.   Denies h/o DVT/PE. Smoked and vaped for about 5 years each. No h/o CTD. Has h/o obesity and was on phenteramine and ozempic briefly. Denies snoring. Does have h/o fatty liver.    Review of Systems: [y] = yes, [ ]  = no   General: Weight gain [ y]; Weight loss [ ] ; Anorexia [ ] ; Fatigue [ y]; Fever [ ] ; Chills [ ] ; Weakness [ ]   Cardiac: Chest pain/pressure [ ] ; Resting SOB [ y]; Exertional SOB [ y]; Orthopnea [ ] ; Pedal Edema  Cove.Etienne ]; Palpitations [ ] ; Syncope [ ] ; Presyncope Cove.Etienne ]; Paroxysmal nocturnal dyspnea[ ]   Pulmonary: Cough [ ] ; Wheezing[ ] ; Hemoptysis[ ] ; Sputum [ ] ; Snoring [ ]   GI: Vomiting[ ] ; Dysphagia[ ] ; Melena[ ] ; Hematochezia [ ] ; Heartburn[ ] ; Abdominal pain [ ] ; Constipation [ ] ; Diarrhea [ ] ; BRBPR [ ]   GU: Hematuria[ ] ; Dysuria [ ] ; Nocturia[ ]   Vascular: Pain in legs with walking [ ] ; Pain in feet with lying flat [ ] ; Non-healing sores [ ] ; Stroke [ ] ; TIA [ ] ; Slurred speech [ ] ;  Neuro: Headaches[ y]; Vertigo[ ] ; Seizures[ ] ; Paresthesias[ ] ;Blurred vision [ ] ; Diplopia [ ] ; Vision changes [ ]   Ortho/Skin: Arthritis [ y]; Joint pain Cove.Etienne ]; Muscle pain [ ] ; Joint swelling [ ] ; Back Pain [ y]; Rash [ ]   Psych: Depression[ y]; Anxiety[ y]  Heme: Bleeding problems [ ] ; Clotting disorders [ ] ; Anemia [ ]   Endocrine: Diabetes [ ] ; Thyroid dysfunction[ ]   Home Medications Prior to Admission medications   Medication Sig Start Date End Date Taking? Authorizing Provider  albuterol (VENTOLIN HFA) 108 (90 Base) MCG/ACT inhaler Inhale 1-2 puffs into the lungs every 6 (six) hours as needed for wheezing or shortness of breath (was just given this for bronchitis).   Yes [provider]  benzonatate (TESSALON) 200 MG capsule Take 200 mg by mouth 3 (three) times daily.   Yes [provider]  MAGNESIUM CITRATE PO Take 1 tablet by mouth daily at 6 (six) AM. Patient not taking: Reported on 07/18/2023  [provider]    Past Medical History: Past Medical History:  Diagnosis Date   Abnormal thyroid function test    Anemia    Anxiety    Bronchitis 06/2023   finished prednisone and currently taking Amoxicillin as of 06-12-23   Cervical lymphadenopathy    Chronic back pain    Ectopic pregnancy    Headache    MIGRAINES   History of kidney stones    HSV infection    Lumbar radiculopathy    Menorrhagia    Palpitations    Pneumonia    06/2023   PONV (postoperative nausea and  vomiting)    DURING KIDNEY STONE REMOVAL   Seizures (HCC) 2014   one occurence-due to Tramadol   UTI (urinary tract infection)     Past Surgical History: Past Surgical History:  Procedure Laterality Date   CARPAL TUNNEL RELEASE Left 10/22/2019   Procedure: CARPAL TUNNEL RELEASE;  Surgeon: Kennedy Bucker, MD;  Location: [facility];  Service: Orthopedics;  Laterality: Left;   CYSTOSCOPY/RETROGRADE/URETEROSCOPY     DILATION AND CURETTAGE OF UTERUS  11/01/2011   Procedure: DILATATION AND CURETTAGE;  Surgeon: Zenaida Niece, MD;  Location: [facility];  Service: Gynecology;  Laterality: N/A;   HARDWARE REMOVAL Left 12/31/2019   Procedure: LEFT WRIST HARDWARE REMOVAL;  Surgeon: Kennedy Bucker, MD;  Location: [facility];  Service: Orthopedics;  Laterality: Left;   LAPAROSCOPIC TUBAL LIGATION Bilateral 07/15/2015   Procedure: LAPAROSCOPIC TUBAL LIGATION;  Surgeon: Nadara Mustard, MD;  Location: [facility];  Service: Gynecology;  Laterality: Bilateral;   LAPAROSCOPIC VAGINAL HYSTERECTOMY WITH SALPINGECTOMY Right 07/18/2023   Procedure: LAPAROSCOPIC ASSISTED VAGINAL HYSTERECTOMY WITH RIGHT SALPINGECTOMY;  Surgeon: Schermerhorn, Ihor Austin, MD;  Location: [facility];  Service: Gynecology;  Laterality: Right;   LAPAROSCOPY  11/01/2011   Procedure: LAPAROSCOPY OPERATIVE;  Surgeon: Zenaida Niece, MD;  Location: [facility];  Service: Gynecology;  Laterality: N/A;   LAPAROSCOPY FOR ECTOPIC PREGNANCY  2008   mtx also   LITHOTRIPSY     OPEN REDUCTION INTERNAL FIXATION (ORIF) DISTAL RADIAL FRACTURE Left 10/22/2019   Procedure: OPEN REDUCTION INTERNAL FIXATION (ORIF) DISTAL RADIAL FRACTURE;  Surgeon: Kennedy Bucker, MD;  Location: [facility];  Service: Orthopedics;  Laterality: Left;   TUBAL LIGATION      Family History: Family History  Problem Relation Age of Onset   Cancer Maternal Grandmother     Social History: Social History   Socioeconomic History   Marital status: Divorced    Spouse name: Not on file    Number of children: Not on file   Years of education: Not on file   Highest education level: Not on file  Occupational History   Not on file  Tobacco Use   Smoking status: Former    Current packs/day: 0.00    Types: Cigarettes    Quit date: 10/21/2019    Years since quitting: 3.7   Smokeless tobacco: Never  Vaping Use   Vaping status: Former   Quit date: 06/06/2023   Substances: Nicotine, Flavoring  Substance and Sexual Activity   Alcohol use: No   Drug use: No   Sexual activity: Yes  Other Topics Concern   Not on file  Social History Narrative   Not on file   Social Determinants of Health   Financial Resource Strain: Not on file  Food Insecurity: No Food Insecurity (07/21/2023)   Hunger Vital Sign    Worried About Running Out of Food in the Last Year: Never true  Ran Out of Food in the Last Year: Never true  Transportation Needs: No Transportation Needs (07/21/2023)   PRAPARE - Administrator, Civil Service (Medical): No    Lack of Transportation (Non-Medical): No  Physical Activity: Not on file  Stress: Not on file  Social Connections: Not on file    Allergies:  Allergies  Allergen Reactions   Sulfa Antibiotics Hives and Itching   Tramadol Other (See Comments)    seizures   Prochlorperazine Edisylate Anxiety    tachycardia    Objective:    Vital Signs:   Temp:  [97.4 F (36.3 C)-98.3 F (36.8 C)] 97.4 F (36.3 C) (09/16 1146) Pulse Rate:  [73-90] 79 (09/16 1146) Resp:  [16-20] 20 (09/16 1146) BP: (116-136)/(80-88) 120/88 (09/16 1146) SpO2:  [93 %-98 %] 96 % (09/16 1146) Weight:  [73.9 kg] 73.9 kg (09/16 0459) Last BM Date : 07/17/23  Weight change: Filed Weights   07/18/23 0631 07/22/23 0459  Weight: 74.4 kg 73.9 kg    Intake/Output:   Intake/Output Summary (Last 24 hours) at 07/22/2023 1257 Last data filed at 07/22/2023 0617 Gross per 24 hour  Intake 1.44 ml  Output 1120 ml  Net -1118.56 ml      Physical Exam    General:   Sitting up in bed No resp difficulty HEENT: normal Neck: supple. JVP to jaw . Carotids 2+ bilat; no bruits. No lymphadenopathy or thyromegaly appreciated. Cor: Regular rate & rhythm. 2/6 TR Lungs: clear Abdomen: obese soft, nontender, nondistended. No hepatosplenomegaly. No bruits or masses. Good bowel sounds. Extremities: no cyanosis, rash, 1+ edema + mild clubbing Neuro: alert & orientedx3, cranial nerves grossly intact. moves all 4 extremities w/o difficulty. Affect pleasant   Telemetry   Sinus 70-80s Personally reviewed  EKG    Sinus tach 106. Normal axis. Anterolateral TWI Personally reviewed   Labs   Basic Metabolic Panel: Recent Labs  Lab 07/18/23 1639 07/19/23 0103 07/20/23 0541 07/21/23 0739  NA 136 135 139 139  K 4.3 4.4 3.9 3.9  CL 104 103 105 105  CO2 18* 21* 25 25  GLUCOSE 168* 178* 106* 106*  BUN 9 8 19 20   CREATININE 0.85 0.75 0.79 0.80  CALCIUM 8.3* 8.2* 8.6* 8.2*    Liver Function Tests: Recent Labs  Lab 07/18/23 1949  AST 35  ALT 37  ALKPHOS 50  BILITOT 0.8  PROT 6.6  ALBUMIN 3.6   No results for input(s): "LIPASE", "AMYLASE" in the last 168 hours. No results for input(s): "AMMONIA" in the last 168 hours.  CBC: Recent Labs  Lab 07/18/23 1639 07/18/23 2131 07/19/23 0103 07/20/23 0541  WBC 13.4*  --  12.5* 11.5*  NEUTROABS  --   --  10.6*  --   HGB 12.8 12.7 11.8* 11.9*  HCT 38.3 37.1 34.6* 36.5  MCV 90.1  --  90.1 94.3  PLT 225  --  193 233    Cardiac Enzymes: No results for input(s): "CKTOTAL", "CKMB", "CKMBINDEX", "TROPONINI" in the last 168 hours.  BNP: BNP (last 3 results) Recent Labs    07/19/23 1542  BNP 470.4*    ProBNP (last 3 results) No results for input(s): "PROBNP" in the last 8760 hours.   CBG: No results for input(s): "GLUCAP" in the last 168 hours.  Coagulation Studies: No results for input(s): "LABPROT", "INR" in the last 72 hours.   Imaging   No results found.   Medications:     Current  Medications:  acetaminophen  650 mg Oral Q8H   cephALEXin  500 mg Oral Q12H   enoxaparin (LOVENOX) injection  40 mg Subcutaneous Q24H   furosemide  20 mg Intravenous Daily   metoprolol tartrate  12.5 mg Oral BID   polyethylene glycol  17 g Oral Daily   potassium chloride  20 mEq Oral BID   senna  1 tablet Oral Daily    Infusions:  sodium chloride 10 mL/hr at 07/22/23 0617     Assessment/Plan   1.  PAH with cor pulmonale - suspect probable WHO Group I (idiopathic PAH) but also has some RFs for WHO Group III component - ECHO  9/24 LVEF 55-60% G2DD. RV mild to moderately reduced function with septal flattening. Mod-sev TR RVSP 83 - CT negative for PE - Plan R/L HC today followed by combination therapy for PAH (PDE51 + ERA to start) - Diurese gently - Will need CTD serologies (we ordered) + VQ, PFTs with DLCO and sleep study - Stop b-blocker  2. Chest tightness - suspect due to [facility] but give CRFs wil proceed with coronary angio at time of cath    Length of Stay: 3  Arvilla Meres, MD  07/22/2023, 12:57 PM  Advanced Heart Failure Team Pager [phone] (M-F; 7a - 5p)  Please contact [facility] for night-coverage after hours (4p -7a ) and weekends on [email or web address]

## 2023-07-22 NOTE — Interval H&P Note (Signed)
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  The various methods of treatment have been discussed with the patient and family. After consideration of risks, benefits and other options for treatment, the patient has consented to  Procedure(s): RIGHT/LEFT HEART CATH AND CORONARY ANGIOGRAPHY (N/A) as a surgical intervention.  The patient's history has been reviewed, patient examined, no change in status, stable for surgery.  I have reviewed the patient's chart and labs.  Questions were answered to the patient's satisfaction.     Gaylen Pereira

## 2023-07-22 NOTE — TOC Progression Note (Signed)
Transition of Care [facility]) - Progression Note    Patient Details  Name: [name] MRN: [id number] Date of Birth: November 26, 1975  Transition of Care [facility]) CM/SW Contact  Truddie Hidden, RN Phone Number: 07/22/2023, 2:54 PM  Clinical Narrative:    TOC continuing to follow patient's progress throughout discharge planning.        Expected Discharge Plan and Services         Expected Discharge Date: 07/18/23                                     Social Determinants of Health (SDOH) Interventions SDOH Screenings   Food Insecurity: No Food Insecurity (07/21/2023)  Housing: Low Risk  (07/21/2023)  Transportation Needs: No Transportation Needs (07/21/2023)  Utilities: Not At Risk (07/21/2023)  Tobacco Use: Medium Risk (07/18/2023)    Readmission Risk Interventions     No data to display

## 2023-07-22 NOTE — Consult Note (Signed)
Advanced Heart Failure Team Consult Note   Primary Physician: Larena Glassman, PA PCP-Cardiologist:  None  Reason for Consultation: Dr. Kirke Corin  HPI:    [name] is seen today for evaluation of RV failure at the request of Dr. Kirke Corin.   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   Says she has had sinus tach for years. Echo 4/22 Normal LV/RV RVSP estimated at . Over past year has had progressive DOE with LE extremity edema and bloating. Occasional chest tightness. Occasional pre-syncope but no syncope.   Underwent elective hysterectomy on 07/18/23. Post procedure became hypoxic with oxygen levels briefly trending into the 70s to 80s% with associated near syncope on walking the halls.  High-sensitivity troponin negative x 2.  COVID and respiratory panel-negative.  Lactic acid 4.9 trending to 3.7.  Chest x-ray without acute cardiopulmonary process.  CTA chest negative for PE with pneumoperitoneum in the setting of hysterectomy, fatty liver, and aortic atherosclerosis.  Echo showed an EF of 55 to 60%, no regional wall motion abnormalities, grade 2 diastolic dysfunction, there was interventricular septum flattening in systole and diastole consistent with RV pressure and volume overload, mildly reduced RV systolic function with moderately large ventricular cavity with severely elevated RVSP estimated 82.6 mmHg, and a moderate to severe tricuspid regurgitation.   Now feeling better but still edematous. + DOE with mild exertion.   Denies h/o DVT/PE. Smoked and vaped for about 5 years each. No h/o CTD. Has h/o obesity and was on phenteramine and ozempic briefly. Denies snoring. Does have h/o fatty liver.    Review of Systems: [y] = yes, [ ]  = no   General: Weight gain [ y]; Weight loss [ ] ; Anorexia [ ] ; Fatigue [ y]; Fever [ ] ; Chills [ ] ; Weakness [ ]   Cardiac: Chest pain/pressure [ ] ; Resting SOB [ y]; Exertional SOB [ y]; Orthopnea [ ] ; Pedal Edema  Cove.Etienne ]; Palpitations [ ] ; Syncope [ ] ; Presyncope Cove.Etienne ]; Paroxysmal nocturnal dyspnea[ ]   Pulmonary: Cough [ ] ; Wheezing[ ] ; Hemoptysis[ ] ; Sputum [ ] ; Snoring [ ]   GI: Vomiting[ ] ; Dysphagia[ ] ; Melena[ ] ; Hematochezia [ ] ; Heartburn[ ] ; Abdominal pain [ ] ; Constipation [ ] ; Diarrhea [ ] ; BRBPR [ ]   GU: Hematuria[ ] ; Dysuria [ ] ; Nocturia[ ]   Vascular: Pain in legs with walking [ ] ; Pain in feet with lying flat [ ] ; Non-healing sores [ ] ; Stroke [ ] ; TIA [ ] ; Slurred speech [ ] ;  Neuro: Headaches[ y]; Vertigo[ ] ; Seizures[ ] ; Paresthesias[ ] ;Blurred vision [ ] ; Diplopia [ ] ; Vision changes [ ]   Ortho/Skin: Arthritis [ y]; Joint pain Cove.Etienne ]; Muscle pain [ ] ; Joint swelling [ ] ; Back Pain [ y]; Rash [ ]   Psych: Depression[ y]; Anxiety[ y]  Heme: Bleeding problems [ ] ; Clotting disorders [ ] ; Anemia [ ]   Endocrine: Diabetes [ ] ; Thyroid dysfunction[ ]   Home Medications Prior to Admission medications   Medication Sig Start Date End Date Taking? Authorizing Provider  albuterol (VENTOLIN HFA) 108 (90 Base) MCG/ACT inhaler Inhale 1-2 puffs into the lungs every 6 (six) hours as needed for wheezing or shortness of breath (was just given this for bronchitis).   Yes [provider]  benzonatate (TESSALON) 200 MG capsule Take 200 mg by mouth 3 (three) times daily.   Yes [provider]  MAGNESIUM CITRATE PO Take 1 tablet by mouth daily at 6 (six) AM. Patient not taking: Reported on 07/18/2023  [provider]    Past Medical History: Past Medical History:  Diagnosis Date   Abnormal thyroid function test    Anemia    Anxiety    Bronchitis 06/2023   finished prednisone and currently taking Amoxicillin as of 06-12-23   Cervical lymphadenopathy    Chronic back pain    Ectopic pregnancy    Headache    MIGRAINES   History of kidney stones    HSV infection    Lumbar radiculopathy    Menorrhagia    Palpitations    Pneumonia    06/2023   PONV (postoperative nausea and  vomiting)    DURING KIDNEY STONE REMOVAL   Seizures (HCC) 2014   one occurence-due to Tramadol   UTI (urinary tract infection)     Past Surgical History: Past Surgical History:  Procedure Laterality Date   CARPAL TUNNEL RELEASE Left 10/22/2019   Procedure: CARPAL TUNNEL RELEASE;  Surgeon: Kennedy Bucker, MD;  Location: [facility];  Service: Orthopedics;  Laterality: Left;   CYSTOSCOPY/RETROGRADE/URETEROSCOPY     DILATION AND CURETTAGE OF UTERUS  11/01/2011   Procedure: DILATATION AND CURETTAGE;  Surgeon: Zenaida Niece, MD;  Location: [facility];  Service: Gynecology;  Laterality: N/A;   HARDWARE REMOVAL Left 12/31/2019   Procedure: LEFT WRIST HARDWARE REMOVAL;  Surgeon: Kennedy Bucker, MD;  Location: [facility];  Service: Orthopedics;  Laterality: Left;   LAPAROSCOPIC TUBAL LIGATION Bilateral 07/15/2015   Procedure: LAPAROSCOPIC TUBAL LIGATION;  Surgeon: Nadara Mustard, MD;  Location: [facility];  Service: Gynecology;  Laterality: Bilateral;   LAPAROSCOPIC VAGINAL HYSTERECTOMY WITH SALPINGECTOMY Right 07/18/2023   Procedure: LAPAROSCOPIC ASSISTED VAGINAL HYSTERECTOMY WITH RIGHT SALPINGECTOMY;  Surgeon: Schermerhorn, Ihor Austin, MD;  Location: [facility];  Service: Gynecology;  Laterality: Right;   LAPAROSCOPY  11/01/2011   Procedure: LAPAROSCOPY OPERATIVE;  Surgeon: Zenaida Niece, MD;  Location: [facility];  Service: Gynecology;  Laterality: N/A;   LAPAROSCOPY FOR ECTOPIC PREGNANCY  2008   mtx also   LITHOTRIPSY     OPEN REDUCTION INTERNAL FIXATION (ORIF) DISTAL RADIAL FRACTURE Left 10/22/2019   Procedure: OPEN REDUCTION INTERNAL FIXATION (ORIF) DISTAL RADIAL FRACTURE;  Surgeon: Kennedy Bucker, MD;  Location: [facility];  Service: Orthopedics;  Laterality: Left;   TUBAL LIGATION      Family History: Family History  Problem Relation Age of Onset   Cancer Maternal Grandmother     Social History: Social History   Socioeconomic History   Marital status: Divorced    Spouse name: Not on file    Number of children: Not on file   Years of education: Not on file   Highest education level: Not on file  Occupational History   Not on file  Tobacco Use   Smoking status: Former    Current packs/day: 0.00    Types: Cigarettes    Quit date: 10/21/2019    Years since quitting: 3.7   Smokeless tobacco: Never  Vaping Use   Vaping status: Former   Quit date: 06/06/2023   Substances: Nicotine, Flavoring  Substance and Sexual Activity   Alcohol use: No   Drug use: No   Sexual activity: Yes  Other Topics Concern   Not on file  Social History Narrative   Not on file   Social Determinants of Health   Financial Resource Strain: Not on file  Food Insecurity: No Food Insecurity (07/21/2023)   Hunger Vital Sign    Worried About Running Out of Food in the Last Year: Never true  Ran Out of Food in the Last Year: Never true  Transportation Needs: No Transportation Needs (07/21/2023)   PRAPARE - Administrator, Civil Service (Medical): No    Lack of Transportation (Non-Medical): No  Physical Activity: Not on file  Stress: Not on file  Social Connections: Not on file    Allergies:  Allergies  Allergen Reactions   Sulfa Antibiotics Hives and Itching   Tramadol Other (See Comments)    seizures   Prochlorperazine Edisylate Anxiety    tachycardia    Objective:    Vital Signs:   Temp:  [97.4 F (36.3 C)-98.3 F (36.8 C)] 97.4 F (36.3 C) (09/16 1146) Pulse Rate:  [73-90] 79 (09/16 1146) Resp:  [16-20] 20 (09/16 1146) BP: (116-136)/(80-88) 120/88 (09/16 1146) SpO2:  [93 %-98 %] 96 % (09/16 1146) Weight:  [73.9 kg] 73.9 kg (09/16 0459) Last BM Date : 07/17/23  Weight change: Filed Weights   07/18/23 0631 07/22/23 0459  Weight: 74.4 kg 73.9 kg    Intake/Output:   Intake/Output Summary (Last 24 hours) at 07/22/2023 1257 Last data filed at 07/22/2023 0617 Gross per 24 hour  Intake 1.44 ml  Output 1120 ml  Net -1118.56 ml      Physical Exam    General:   Sitting up in bed No resp difficulty HEENT: normal Neck: supple. JVP to jaw . Carotids 2+ bilat; no bruits. No lymphadenopathy or thyromegaly appreciated. Cor: Regular rate & rhythm. 2/6 TR Lungs: clear Abdomen: obese soft, nontender, nondistended. No hepatosplenomegaly. No bruits or masses. Good bowel sounds. Extremities: no cyanosis, rash, 1+ edema + mild clubbing Neuro: alert & orientedx3, cranial nerves grossly intact. moves all 4 extremities w/o difficulty. Affect pleasant   Telemetry   Sinus 70-80s Personally reviewed  EKG    Sinus tach 106. Normal axis. Anterolateral TWI Personally reviewed   Labs   Basic Metabolic Panel: Recent Labs  Lab 07/18/23 1639 07/19/23 0103 07/20/23 0541 07/21/23 0739  NA 136 135 139 139  K 4.3 4.4 3.9 3.9  CL 104 103 105 105  CO2 18* 21* 25 25  GLUCOSE 168* 178* 106* 106*  BUN 9 8 19 20   CREATININE 0.85 0.75 0.79 0.80  CALCIUM 8.3* 8.2* 8.6* 8.2*    Liver Function Tests: Recent Labs  Lab 07/18/23 1949  AST 35  ALT 37  ALKPHOS 50  BILITOT 0.8  PROT 6.6  ALBUMIN 3.6   No results for input(s): "LIPASE", "AMYLASE" in the last 168 hours. No results for input(s): "AMMONIA" in the last 168 hours.  CBC: Recent Labs  Lab 07/18/23 1639 07/18/23 2131 07/19/23 0103 07/20/23 0541  WBC 13.4*  --  12.5* 11.5*  NEUTROABS  --   --  10.6*  --   HGB 12.8 12.7 11.8* 11.9*  HCT 38.3 37.1 34.6* 36.5  MCV 90.1  --  90.1 94.3  PLT 225  --  193 233    Cardiac Enzymes: No results for input(s): "CKTOTAL", "CKMB", "CKMBINDEX", "TROPONINI" in the last 168 hours.  BNP: BNP (last 3 results) Recent Labs    07/19/23 1542  BNP 470.4*    ProBNP (last 3 results) No results for input(s): "PROBNP" in the last 8760 hours.   CBG: No results for input(s): "GLUCAP" in the last 168 hours.  Coagulation Studies: No results for input(s): "LABPROT", "INR" in the last 72 hours.   Imaging   No results found.   Medications:     Current  Medications:  acetaminophen  650 mg Oral Q8H   cephALEXin  500 mg Oral Q12H   enoxaparin (LOVENOX) injection  40 mg Subcutaneous Q24H   furosemide  20 mg Intravenous Daily   metoprolol tartrate  12.5 mg Oral BID   polyethylene glycol  17 g Oral Daily   potassium chloride  20 mEq Oral BID   senna  1 tablet Oral Daily    Infusions:  sodium chloride 10 mL/hr at 07/22/23 0617     Assessment/Plan   1.  PAH with cor pulmonale - suspect probable WHO Group I (idiopathic PAH) but also has some RFs for WHO Group III component - ECHO  9/24 LVEF 55-60% G2DD. RV mild to moderately reduced function with septal flattening. Mod-sev TR RVSP 83 - CT negative for PE - Plan R/L HC today followed by combination therapy for PAH (PDE51 + ERA to start) - Diurese gently - Will need CTD serologies (we ordered) + VQ, PFTs with DLCO and sleep study - Stop b-blocker  2. Chest tightness - suspect due to [facility] but give CRFs wil proceed with coronary angio at time of cath    Length of Stay: 3  Arvilla Meres, MD  07/22/2023, 12:57 PM  Advanced Heart Failure Team Pager [phone] (M-F; 7a - 5p)  Please contact [facility] for night-coverage after hours (4p -7a ) and weekends on [email or web address]

## 2023-07-22 NOTE — Progress Notes (Signed)
[facility]  - [place] at [facility]   PATIENT NAME: [name]    MR#:  [id number]  DATE OF BIRTH:  [birth year]  SUBJECTIVE:  no family at bedside. Overall feels somewhat better. Anxious NPO for cath   VITALS:  Blood pressure 124/87, pulse 76, temperature 98.3 F (36.8 C), temperature source Oral, resp. rate 20, height 5\' 3"  (1.6 m), weight 73.9 kg, SpO2 98%.  PHYSICAL EXAMINATION:   GENERAL:  47 y.o.-year-old patient with no acute distress. Obese LUNGS: Normal breath sounds bilaterally, no wheezing CARDIOVASCULAR: S1, S2 normal. No murmur, tachycardia ABDOMEN: Soft, nontender, nondistended. Bowel sounds present.  EXTREMITIES: No  edema b/l.    NEUROLOGIC: nonfocal  patient is alert and awa  LABORATORY PANEL:  CBC Recent Labs  Lab 07/20/23 0541  WBC 11.5*  HGB 11.9*  HCT 36.5  PLT 233    Chemistries  Recent Labs  Lab 07/18/23 1949 07/19/23 0103 07/21/23 0739  NA  --    < > 139  K  --    < > 3.9  CL  --    < > 105  CO2  --    < > 25  GLUCOSE  --    < > 106*  BUN  --    < > 20  CREATININE  --    < > 0.80  CALCIUM  --    < > 8.2*  AST 35  --   --   ALT 37  --   --   ALKPHOS 50  --   --   BILITOT 0.8  --   --    < > = values in this interval not displayed.   Cardiac Enzymes No results for input(s): "TROPONINI" in the last 168 hours. RADIOLOGY:  No results found.  Assessment and Plan  [name] is a 47 y.o. female with medical history significant of migraines, menorrhagia who presents to the hospital for scheduled hysterectomy, with consultation placed due to shortness of breath and chest pain.  pt states that she had been experiencing some shortness of breath and subsequent development of chest tightness that occurs with exertion but she had assumed this is due to weight gain.  Then today after her surgery, she noted that she had persistent shortness of breath with some chest tightness.  She says she quit vaping approximately 6 weeks  ago.  She denies any known history of asthma or COPD.  CT chest No acute intrathoracic pathology. No CT evidence of pulmonary embolism. 2. Pneumoperitoneum in keeping with hysterectomy. 3. Fatty liver. 4.  Aortic Atherosclerosis  Echo of the heart1. Left ventricular ejection fraction, by estimation, is 55 to 60%. The  left ventricle has normal function. The left ventricle has no regional  wall motion abnormalities. Left ventricular diastolic parameters are  consistent with Grade II diastolic  dysfunction (pseudonormalization). There is the interventricular septum is  flattened in systole and diastole, consistent with right ventricular  pressure and volume overload.   2. Right ventricular systolic function is mildly reduced. The right  ventricular size is moderately enlarged. There is severely elevated  pulmonary artery systolic pressure. The estimated right ventricular  systolic pressure is 82.6 mmHg.   3. The mitral valve is normal in structure. No evidence of mitral valve  regurgitation.   4. Tricuspid valve regurgitation is moderate to severe.   5. The aortic valve is tricuspid. Aortic valve regurgitation is not  visualized.   Acute hypoxic respiratory failure (HCC) with  hypoxia suspected due to severe pulmonary hypertension/elevated right ventricular pressures and moderate to severe TR Chest tightness/pain --Patient presented today for scheduled hysterectomy, complicated by postoperative hypoxia into the 70s and 80s. Given additional tachycardia and chest heaviness, -- CT chest negative for PE -- cardiology consultation with [facility] cardiology. Dr. Kirke Corin to see patient. -- Troponin's flat -- await further cardiology recommendations -- patient desaturated into the 80s. Continue 2 L nasal cannula oxygen. -- IV Lasix 20 mg daily--diuresing well -- Cardiac cath plan by cardiology for 9/16   Hepatic steatosis --Likely in the setting of obesity.   S/P hysterectomy --Patient is  postop day # 4 s/p laparoscopic vaginal hysterectomy with right salpingectomy. --Oxycodone as needed for pain control - Management per OB/GYN -- abnormal UA with significant burning postop will treat empirically with PO Keflex   Leukocytosis Likely reactive due to surgery earlier today.  Anxiety -- PRN Xanax, will give scheduled dose of Vistaril for couple days.  Procedures: Family communication : none. Per pt family is aware Consults : OB/GYN, [facility] cardiology CODE STATUS: full DVT Prophylaxis : Lovenox Level of care: Progressive Status is: Inpatient Remains inpatient appropriate because: severe pulmonary hypertension,cath today    TOTAL TIME TAKING CARE OF THIS PATIENT: 35 minutes.  >50% time spent on counselling and coordination of care  Note: This dictation was prepared with Dragon dictation along with smaller phrase technology. Any transcriptional errors that result from this process are unintentional.  Enedina Finner M.D    [facility]   CC: Primary care physician; Larena Glassman, [place]

## 2023-07-22 NOTE — TOC Benefit Eligibility Note (Signed)
Patient Product/process development scientist completed.    The patient is insured through [employer] [place].     Ran test claim for Farxiga 10 mg and Requires Prior Authorization  Ran test claim for Jardiance 10 mg and Requires Prior Authorization  This test claim was processed through [employer]- copay amounts may vary at other pharmacies due to [employer], or as the patient moves through the different stages of their insurance plan.     Roland Earl, CPHT Pharmacy Technician III Certified Patient Advocate [facility] Patient Advocate Team Direct Number: [phone]  Fax: [phone]

## 2023-07-23 ENCOUNTER — Inpatient Hospital Stay: Payer: Managed Care, Other (non HMO)

## 2023-07-23 ENCOUNTER — Encounter: Payer: Self-pay | Admitting: Internal Medicine

## 2023-07-23 ENCOUNTER — Other Ambulatory Visit (HOSPITAL_COMMUNITY): Payer: Self-pay

## 2023-07-23 DIAGNOSIS — I50810 Right heart failure, unspecified: Secondary | ICD-10-CM | POA: Diagnosis not present

## 2023-07-23 DIAGNOSIS — J9601 Acute respiratory failure with hypoxia: Secondary | ICD-10-CM | POA: Diagnosis not present

## 2023-07-23 DIAGNOSIS — I2510 Atherosclerotic heart disease of native coronary artery without angina pectoris: Secondary | ICD-10-CM

## 2023-07-23 LAB — POCT I-STAT 7, (LYTES, BLD GAS, ICA,H+H)
Acid-Base Excess: 0 mmol/L (ref 0.0–2.0)
Bicarbonate: 22.7 mmol/L (ref 20.0–28.0)
Calcium, Ion: 0.95 mmol/L — ABNORMAL LOW (ref 1.15–1.40)
HCT: 37 % (ref 36.0–46.0)
Hemoglobin: 12.6 g/dL (ref 12.0–15.0)
O2 Saturation: 95 %
Potassium: 3.6 mmol/L (ref 3.5–5.1)
Sodium: 142 mmol/L (ref 135–145)
TCO2: 24 mmol/L (ref 22–32)
pCO2 arterial: 32.1 mmHg (ref 32–48)
pH, Arterial: 7.459 — ABNORMAL HIGH (ref 7.35–7.45)
pO2, Arterial: 71 mmHg — ABNORMAL LOW (ref 83–108)

## 2023-07-23 LAB — POCT I-STAT EG7
Acid-Base Excess: 3 mmol/L — ABNORMAL HIGH (ref 0.0–2.0)
Bicarbonate: 28.5 mmol/L — ABNORMAL HIGH (ref 20.0–28.0)
Calcium, Ion: 1.17 mmol/L (ref 1.15–1.40)
HCT: 41 % (ref 36.0–46.0)
Hemoglobin: 13.9 g/dL (ref 12.0–15.0)
O2 Saturation: 57 %
Potassium: 4.1 mmol/L (ref 3.5–5.1)
Sodium: 138 mmol/L (ref 135–145)
TCO2: 30 mmol/L (ref 22–32)
pCO2, Ven: 44.2 mmHg (ref 44–60)
pH, Ven: 7.417 (ref 7.25–7.43)
pO2, Ven: 30 mmHg — CL (ref 32–45)

## 2023-07-23 LAB — CBC WITH DIFFERENTIAL/PLATELET
Abs Immature Granulocytes: 0.21 10*3/uL — ABNORMAL HIGH (ref 0.00–0.07)
Basophils Absolute: 0.1 10*3/uL (ref 0.0–0.1)
Basophils Relative: 1 %
Eosinophils Absolute: 0.5 10*3/uL (ref 0.0–0.5)
Eosinophils Relative: 5 %
HCT: 40.7 % (ref 36.0–46.0)
Hemoglobin: 13.6 g/dL (ref 12.0–15.0)
Immature Granulocytes: 2 %
Lymphocytes Relative: 36 %
Lymphs Abs: 3.4 10*3/uL (ref 0.7–4.0)
MCH: 30.5 pg (ref 26.0–34.0)
MCHC: 33.4 g/dL (ref 30.0–36.0)
MCV: 91.3 fL (ref 80.0–100.0)
Monocytes Absolute: 0.9 10*3/uL (ref 0.1–1.0)
Monocytes Relative: 9 %
Neutro Abs: 4.5 10*3/uL (ref 1.7–7.7)
Neutrophils Relative %: 47 %
Platelets: 326 10*3/uL (ref 150–400)
RBC: 4.46 MIL/uL (ref 3.87–5.11)
RDW: 13.8 % (ref 11.5–15.5)
WBC: 9.6 10*3/uL (ref 4.0–10.5)
nRBC: 0.2 % (ref 0.0–0.2)

## 2023-07-23 LAB — CULTURE, BLOOD (ROUTINE X 2)
Culture: NO GROWTH
Culture: NO GROWTH

## 2023-07-23 LAB — COMPREHENSIVE METABOLIC PANEL
ALT: 29 U/L (ref 0–44)
AST: 19 U/L (ref 15–41)
Albumin: 3.5 g/dL (ref 3.5–5.0)
Alkaline Phosphatase: 48 U/L (ref 38–126)
Anion gap: 9 (ref 5–15)
BUN: 18 mg/dL (ref 6–20)
CO2: 24 mmol/L (ref 22–32)
Calcium: 8.6 mg/dL — ABNORMAL LOW (ref 8.9–10.3)
Chloride: 103 mmol/L (ref 98–111)
Creatinine, Ser: 0.83 mg/dL (ref 0.44–1.00)
GFR, Estimated: >60 mL/min (ref >60)
Glucose, Bld: 101 mg/dL — ABNORMAL HIGH (ref 70–99)
Potassium: 4.4 mmol/L (ref 3.5–5.1)
Sodium: 136 mmol/L (ref 135–145)
Total Bilirubin: 0.9 mg/dL (ref 0.3–1.2)
Total Protein: 6.3 g/dL — ABNORMAL LOW (ref 6.5–8.1)

## 2023-07-23 LAB — CYCLIC CITRUL PEPTIDE ANTIBODY, IGG/IGA: CCP Antibodies IgG/IgA: 6 U (ref 0–19)

## 2023-07-23 LAB — C-REACTIVE PROTEIN: CRP: 1 mg/dL — ABNORMAL HIGH (ref <1.0)

## 2023-07-23 LAB — ANTI-SCLERODERMA ANTIBODY: Scleroderma (Scl-70) (ENA) Antibody, IgG: <0.2 AI (ref 0.0–0.9)

## 2023-07-23 MED ORDER — TECHNETIUM TO 99M ALBUMIN AGGREGATED
4.0000 | Freq: Once | INTRAVENOUS | Status: AC | PRN
  Administered 2023-07-23: 4.24 via INTRAVENOUS

## 2023-07-23 MED ORDER — SILDENAFIL CITRATE 20 MG PO TABS
20.0000 mg | ORAL_TABLET | Freq: Three times a day (TID) | ORAL | Status: DC
  Administered 2023-07-23 – 2023-07-24 (×3): 20 mg via ORAL
  Filled 2023-07-23 (×4): qty 1

## 2023-07-23 MED ORDER — ROSUVASTATIN CALCIUM 10 MG PO TABS
10.0000 mg | ORAL_TABLET | Freq: Every day | ORAL | Status: DC
  Administered 2023-07-23 – 2023-07-24 (×2): 10 mg via ORAL
  Filled 2023-07-23 (×2): qty 1

## 2023-07-23 MED ORDER — DIGOXIN 125 MCG PO TABS
0.1250 mg | ORAL_TABLET | Freq: Every day | ORAL | Status: DC
  Administered 2023-07-23 – 2023-07-24 (×2): 0.125 mg via ORAL
  Filled 2023-07-23 (×2): qty 1

## 2023-07-23 NOTE — Assessment & Plan Note (Addendum)
Post-operatively developed acute hypoxic respiratory failure with O2 sats in 70s-80s Patient states that she has been experiencing several week history of shortness of breath with a sensation of chest tightness when exerting herself  She was subsequently found to have right heart failure resulting from [facility] She has been started on therapy for this issue with improvement - sildenafil and Digoxin (held due to marginal BP this AM), macitentan pending insurance approval Currently on RA Per cardiology, she is having VQ scan today   Likely dc to home tomorrow if BP is stable

## 2023-07-23 NOTE — Progress Notes (Signed)
Advanced Heart Failure Rounding Note  PCP-Cardiologist: None   Subjective:    RHC/LHC done yesterday with moderate PAH.   Feels ok. Today. VQ and PFTs pending. Digoxin held this am by RN due to SBP 85.   Cath 9/16 Ao = 102/71 (85) LV = 101/6 RA = 4  RV = 70/7 PA = 71/27 (41) PCW = 4 Fick cardiac output/index = 3.6/2.1 Thermo CO/CI = 3.2/1.8 PVR = 10.2 WU FA sat = 95% PA sat = 57%, 59% PAPi = 11    Assessment: 1. Mild non-obstructive CAD  2. LVEF 55-60% 3. Moderate PAH with moderate to severely reduced CO   Objective:   Weight Range: 73.9 kg Body mass index is 28.86 kg/m.   Vital Signs:   Temp:  [97.4 F (36.3 C)-98.2 F (36.8 C)] 97.5 F (36.4 C) (09/17 0833) Pulse Rate:  [0-90] 87 (09/17 0833) Resp:  [10-26] 16 (09/17 0833) BP: (85-134)/(67-93) 85/70 (09/17 0833) SpO2:  [88 %-97 %] 96 % (09/17 0833) Last BM Date : 07/22/23  Weight change: Filed Weights   07/18/23 0631 07/22/23 0459  Weight: 74.4 kg 73.9 kg    Intake/Output:   Intake/Output Summary (Last 24 hours) at 07/23/2023 0934 Last data filed at 07/23/2023 0500 Gross per 24 hour  Intake 540 ml  Output 500 ml  Net 40 ml      Physical Exam    General: Sitting up in bed  No resp difficulty HEENT: Normal Neck: Supple. JVP . Carotids 2+ bilat; no bruits. No lymphadenopathy or thyromegaly appreciated. Cor: PMI nondisplaced. Regular rate & rhythm. N2/6 TR Lungs: Clear Abdomen: Soft, nontender, nondistended. No hepatosplenomegaly. No bruits or masses. Good bowel sounds. Extremities: No cyanosis, clubbing, rash, edema Neuro: Alert & orientedx3, cranial nerves grossly intact. moves all 4 extremities w/o difficulty. Affect pleasant   Telemetry   Sinus 80-90s Personally reviewed   Labs    CBC Recent Labs    07/22/23 1633 07/23/23 0539  WBC  --  9.6  NEUTROABS  --  4.5  HGB 13.6 13.6  HCT 40.0 40.7  MCV  --  91.3  PLT  --  326   Basic Metabolic Panel Recent Labs     07/21/23 0739 07/22/23 1627 07/22/23 1633 07/23/23 0539  NA 139   < > 137 136  K 3.9   < > 4.0 4.4  CL 105  --   --  103  CO2 25  --   --  24  GLUCOSE 106*  --   --  101*  BUN 20  --   --  18  CREATININE 0.80  --   --  0.83  CALCIUM 8.2*  --   --  8.6*   < > = values in this interval not displayed.   Liver Function Tests Recent Labs    07/23/23 0539  AST 19  ALT 29  ALKPHOS 48  BILITOT 0.9  PROT 6.3*  ALBUMIN 3.5   No results for input(s): "LIPASE", "AMYLASE" in the last 72 hours. Cardiac Enzymes No results for input(s): "CKTOTAL", "CKMB", "CKMBINDEX", "TROPONINI" in the last 72 hours.  BNP: BNP (last 3 results) Recent Labs    07/19/23 1542  BNP 470.4*    ProBNP (last 3 results) No results for input(s): "PROBNP" in the last 8760 hours.   D-Dimer No results for input(s): "DDIMER" in the last 72 hours. Hemoglobin A1C No results for input(s): "HGBA1C" in the last 72 hours. Fasting Lipid Panel No results  for input(s): "CHOL", "HDL", "LDLCALC", "TRIG", "CHOLHDL", "LDLDIRECT" in the last 72 hours. Thyroid Function Tests No results for input(s): "TSH", "T4TOTAL", "T3FREE", "THYROIDAB" in the last 72 hours.  Invalid input(s): "FREET3"  Other results:   Imaging    CARDIAC CATHETERIZATION  Result Date: 07/22/2023   Mid LAD lesion is 40% stenosed.   The left ventricular ejection fraction is 55-65% by visual estimate. Findings: Ao = 102/71 (85) LV = 101/6 RA = 4 RV = 70/7 PA = 71/27 (41) PCW = 4 Fick cardiac output/index = 3.6/2.1 Thermo CO/CI = 3.2/1.8 PVR = 10.2 WU FA sat = 95% PA sat = 57%, 59% PAPi = 11 Assessment: 1. Mild non-obstructive CAD 2. LVEF 55-60% 3. Moderate PAH with moderate to severely reduced CO Plan/Discussion: Start combination therapy for PAH. Start digoxin. Stop diuretics. Arvilla Meres, MD 5:05 PM    Medications:     Scheduled Medications:  acetaminophen  650 mg Oral Q8H   digoxin  0.125 mg Oral Daily   enoxaparin (LOVENOX) injection   40 mg Subcutaneous Q24H   enoxaparin (LOVENOX) injection  40 mg Subcutaneous Q24H   polyethylene glycol  17 g Oral Daily   potassium chloride  20 mEq Oral BID   senna  1 tablet Oral Daily   sodium chloride flush  3 mL Intravenous Q12H    Infusions:  sodium chloride      PRN Medications: sodium chloride, acetaminophen, ALPRAZolam, docusate sodium, levalbuterol, nitroGLYCERIN, ondansetron (ZOFRAN) IV, ondansetron (ZOFRAN) IV, ondansetron, mouth rinse, oxyCODONE, sodium chloride flush  Assessment/Plan   1.  PAH with cor pulmonale - Likely WHO Group I (idiopathic PAH) but also has some RFs for WHO Group III component - ECHO  9/24 LVEF 55-60% G2DD. RV mild to moderately reduced function with septal flattening. Mod-sev TR RVSP 83 - CT negative for PE - RA 4 PA 71/27 (41) PCW  4 Fick 3.6/2.1 Thermo CO/CI 3.2/1.8 PVR 10.2 WU PAPi 11  - CTD serologies + VQ, PFTs with DLCO and sleep study pending - Continue sildenafil - PA placed for macitentan - Avoid diuretics (likely a little dry).  - Potentially home tomorrow if BP stable on sildenafil    2. Chest tightness/CAD - mild CAD on cath LAD 40%  - start statin  Length of Stay: 4  Arvilla Meres, MD  07/23/2023, 9:34 AM  Advanced Heart Failure Team Pager [phone] (M-F; 7a - 5p)  Please contact [facility] for night-coverage after hours (5p -7a ) and weekends on [email or web address]

## 2023-07-23 NOTE — Plan of Care (Signed)
  Problem: Activity: Goal: Ability to tolerate increased activity will improve Outcome: Progressing   Problem: Cardiac: Goal: Ability to achieve and maintain adequate cardiovascular perfusion will improve Outcome: Progressing   Problem: Clinical Measurements: Goal: Respiratory complications will improve Outcome: Progressing   Problem: Clinical Measurements: Goal: Respiratory complications will improve Outcome: Progressing Goal: Cardiovascular complication will be avoided Outcome: Progressing   Problem: Elimination: Goal: Will not experience complications related to bowel motility Outcome: Progressing

## 2023-07-23 NOTE — Progress Notes (Signed)
Progress Note   Patient: [name] HYQ:[id number] DOB: 1975-11-20 DOA: 07/18/2023     4 DOS: the patient was seen and examined on 07/23/2023   Brief hospital course: 47yo with h/o menorrhagia who presented on 9/12 for scheduled hysterectomy but complained of CP/SOB post-operatively with O2 sats in 70-80s.  CTA negative for PE.  Echo with preserved EF, grade 2 diastolic dysfunction and evidence of R heart failure with moderate to severe TR.  Cardiology consulted and she underwent R/L HC on 9/16 which showed mild non-obstructive CAD and moderate PAH with moderate to severely reduced CO.  She was started on Digoxin and combination therapy for PAH.  Assessment and Plan: CAD S/P percutaneous coronary angioplasty Patient reported several weeks of CP prior to procedure LHC with mild nonobstructive CAD Started empirically on rosuvastatin  Pulmonary HTN (HCC) Post-operatively developed acute hypoxic respiratory failure with O2 sats in 70s-80s Patient states that she has been experiencing several week history of shortness of breath with a sensation of chest tightness when exerting herself  She was subsequently found to have right heart failure resulting from [facility] She has been started on therapy for this issue with improvement - sildenafil and Digoxin (held due to marginal BP this AM), macitentan pending insurance approval Currently on RA Per cardiology, she is having VQ scan today   Likely dc to home tomorrow if BP is stable  Hepatic steatosis Likely in the setting of obesity  S/P hysterectomy Patient presented for scheduled laparoscopic vaginal hysterectomy with right salpingectomy on 9/12 This was performed without complication but she was SBO and hypoxic post-procedure Oxycodone as needed for pain control Management per OB/GYN      Consultants: OB/GYN Cardiology  Procedures: Echocardiogram 9/13 R/L heart catheterization 9/16 VQ Scan 9/17  Antibiotics: None  30 Day Unplanned  Readmission Risk Score    Flowsheet Row Admission (Current) from 07/18/2023 in [facility]  30 Day Unplanned Readmission Risk Score (%) 10.28 Filed at 07/23/2023 0801       This score is the patient's risk of an unplanned readmission within 30 days of being discharged (0 -100%). The score is based on dignosis, age, lab data, medications, orders, and past utilization.   Low:  0-14.9   Medium: 15-21.9   High: 22-29.9   Extreme: 30 and above           Subjective: She is feeling better, not on O2, ambulating without difficulty, still processing her diagnosis.  Post-operative pain as expected.   Objective: Vitals:   07/23/23 0833 07/23/23 1108  BP: (!) 85/70 101/82  Pulse: 87 87  Resp: 16 16  Temp: (!) 97.5 F (36.4 C) 98.2 F (36.8 C)  SpO2: 96% 93%    Intake/Output Summary (Last 24 hours) at 07/23/2023 1459 Last data filed at 07/23/2023 0500 Gross per 24 hour  Intake 540 ml  Output 500 ml  Net 40 ml   Filed Weights   07/18/23 0631 07/22/23 0459  Weight: 74.4 kg 73.9 kg    Exam:  General:  Appears calm and comfortable and is in NAD, on RA Eyes:  EOMI, normal lids, iris ENT:  grossly normal hearing, lips & tongue, mmm Neck:  no LAD, masses or thyromegaly Cardiovascular:  RRR, no m/r/g. No LE edema.  Respiratory:   CTA bilaterally with no wheezes/rales/rhonchi.  Normal respiratory effort. Abdomen:  soft, appropriately tender, well-healing lap scars, +distention Skin:  no rash or induration seen on limited exam Musculoskeletal:  grossly normal tone  BUE/BLE, good ROM, no bony abnormality Psychiatric:  grossly normal mood and affect, speech fluent and appropriate, AOx3 Neurologic:  CN 2-12 grossly intact, moves all extremities in coordinated fashion  Data Reviewed: I have reviewed the patient's lab results since admission.  Pertinent labs for today include:   Glucose 101 Normal LFTs Normal CBC   Family Communication: None  present  Disposition: Status is: Inpatient Remains inpatient appropriate because: ongoing evaluation and treatment     Time spent: 50 minutes  Unresulted Labs (From admission, onward)     Start     Ordered   07/24/23 0500  Basic metabolic panel  Tomorrow morning,   R       Question:  Specimen collection method  Answer:  Lab=Lab collect   07/23/23 1458   07/23/23 0833  CBC with Differential/Platelet  Once,   R       Question:  Specimen collection method  Answer:  Lab=Lab collect   07/23/23 0833   07/23/23 0500  Lipoprotein A (LPA)  Tomorrow morning,   R       Question:  Specimen collection method  Answer:  Lab=Lab collect   07/22/23 1712   07/22/23 1356  CYCLIC CITRUL PEPTIDE ANTIBODY, IGG/IGA  Once,   R       Question:  Specimen collection method  Answer:  Lab=Lab collect   07/22/23 1355   07/22/23 1344  ANA, IFA (with reflex)  Once,   R       Question:  Specimen collection method  Answer:  Lab=Lab collect   07/22/23 1355   07/22/23 1344  ANCA Profile  Once,   R       Question:  Specimen collection method  Answer:  Lab=Lab collect   07/22/23 1355   07/22/23 1344  C3 complement  Once,   R       Question:  Specimen collection method  Answer:  Lab=Lab collect   07/22/23 1355   07/22/23 1344  C4 complement  Once,   R       Question:  Specimen collection method  Answer:  Lab=Lab collect   07/22/23 1355   07/22/23 1344  Rheumatoid factor  Once,   R       Question:  Specimen collection method  Answer:  Lab=Lab collect   07/22/23 1355             Author: Jonah Blue, MD 07/23/2023 2:59 PM  For on call review [email or web address].

## 2023-07-23 NOTE — Assessment & Plan Note (Signed)
Patient reported several weeks of CP prior to procedure LHC with mild nonobstructive CAD Started empirically on rosuvastatin

## 2023-07-23 NOTE — Hospital Course (Signed)
47yo with h/o menorrhagia who presented on 9/12 for scheduled hysterectomy but complained of CP/SOB post-operatively with O2 sats in 70-80s.  CTA negative for PE.  Echo with preserved EF, grade 2 diastolic dysfunction and evidence of R heart failure with moderate to severe TR.  Cardiology consulted and she underwent R/L HC on 9/16 which showed mild non-obstructive CAD and moderate PAH with moderate to severely reduced CO.  She was started on Digoxin and combination therapy for PAH.

## 2023-07-24 ENCOUNTER — Encounter: Payer: Self-pay | Admitting: Oncology

## 2023-07-24 ENCOUNTER — Other Ambulatory Visit (HOSPITAL_COMMUNITY): Payer: Self-pay

## 2023-07-24 ENCOUNTER — Telehealth: Payer: Self-pay | Admitting: Pharmacist

## 2023-07-24 ENCOUNTER — Other Ambulatory Visit: Payer: Self-pay

## 2023-07-24 DIAGNOSIS — I272 Pulmonary hypertension, unspecified: Secondary | ICD-10-CM | POA: Diagnosis not present

## 2023-07-24 DIAGNOSIS — Z90710 Acquired absence of both cervix and uterus: Secondary | ICD-10-CM | POA: Diagnosis not present

## 2023-07-24 MED ORDER — ROSUVASTATIN CALCIUM 10 MG PO TABS
10.0000 mg | ORAL_TABLET | Freq: Every day | ORAL | 1 refills | Status: DC
  Filled 2023-07-24: qty 30, 30d supply, fill #0

## 2023-07-24 MED ORDER — SILDENAFIL CITRATE 20 MG PO TABS
20.0000 mg | ORAL_TABLET | Freq: Three times a day (TID) | ORAL | 1 refills | Status: DC
  Filled 2023-07-24 (×2): qty 90, 30d supply, fill #0

## 2023-07-24 MED ORDER — OXYCODONE HCL 5 MG PO TABS
5.0000 mg | ORAL_TABLET | Freq: Four times a day (QID) | ORAL | 0 refills | Status: DC | PRN
  Filled 2023-07-24: qty 10, 3d supply, fill #0

## 2023-07-24 MED ORDER — DIGOXIN 125 MCG PO TABS
0.1250 mg | ORAL_TABLET | Freq: Every day | ORAL | 1 refills | Status: DC
  Filled 2023-07-24: qty 30, 30d supply, fill #0

## 2023-07-24 NOTE — Telephone Encounter (Signed)
Prior authorizations for sildenafil ([id number]), tadalafil ([id number]), and [employer] are approved. [employer] form sent for [employer] [facility]). Patient to receive medication through [facility]. Baseline LFTs were normal.

## 2023-07-24 NOTE — Discharge Summary (Addendum)
NUCLEAR MEDICINE PERFUSION LUNG SCAN TECHNIQUE: Perfusion images were obtained in multiple projections after intravenous injection of radiopharmaceutical. Ventilation scans intentionally deferred if perfusion scan and chest x-ray adequate for interpretation during COVID 19 epidemic. RADIOPHARMACEUTICALS:  4.24 mCi Tc-70m MAA IV COMPARISON:  Chest radiograph earlier today.  Chest CTA 07/18/2023 FINDINGS: Slight heterogeneous distribution of radiotracer, increased in the posterior lung bases. No wedge-shaped perfusion defects to suggest pulmonary embolus. IMPRESSION: Slight heterogeneous pulmonary perfusion but no wedge-shaped perfusion defects to suggest pulmonary embolus. Electronically Signed   By: Narda Rutherford M.D.   On: 07/23/2023 16:55   DG Chest 2 View  Result Date: 07/23/2023 CLINICAL DATA:  Pulmonary hypertension EXAM: CHEST - 2 VIEW COMPARISON:  Chest radiograph 07/18/2023 FINDINGS: The cardiomediastinal  silhouette is normal There is no focal consolidation or pulmonary edema. There is no pleural effusion or pneumothorax There is no acute osseous abnormality. IMPRESSION: No radiographic evidence of acute cardiopulmonary process. Electronically Signed   By: Lesia Hausen M.D.   On: 07/23/2023 13:55   CARDIAC CATHETERIZATION  Result Date: 07/22/2023   Mid LAD lesion is 40% stenosed.   The left ventricular ejection fraction is 55-65% by visual estimate. Findings: Ao = 102/71 (85) LV = 101/6 RA = 4 RV = 70/7 PA = 71/27 (41) PCW = 4 Fick cardiac output/index = 3.6/2.1 Thermo CO/CI = 3.2/1.8 PVR = 10.2 WU FA sat = 95% PA sat = 57%, 59% PAPi = 11 Assessment: 1. Mild non-obstructive CAD 2. LVEF 55-60% 3. Moderate PAH with moderate to severely reduced CO Plan/Discussion: Start combination therapy for PAH. Start digoxin. Stop diuretics. Arvilla Meres, MD 5:05 PM  ECHOCARDIOGRAM COMPLETE  Result Date: 07/19/2023    ECHOCARDIOGRAM REPORT   Patient Name:   [name] Date of Exam: 07/19/2023 Medical Rec #:  [id number]    Height:       63.0 in Accession #:    [id number]   Weight:       164.0 lb Date of Birth:  1976-08-11    BSA:          1.777 m Patient Age:    47 years     BP:           113/78 mmHg Patient Gender: F            HR:           107 bpm. Exam Location:  [facility] Procedure: 2D Echo, Cardiac Doppler and Color Doppler Indications:     Chest pain  History:         Patient has prior history of Echocardiogram examinations, most                  recent 03/02/2021. Signs/Symptoms:Chest Pain and Fatigue.  Sonographer:     Mikki Harbor Referring Phys:  [id number] Verdene Lennert Diagnosing Phys: Debbe Odea MD  Sonographer Comments: Image acquisition challenging due to respiratory motion. IMPRESSIONS  1. Left ventricular ejection fraction, by estimation, is 55 to 60%. The left ventricle has normal function. The left ventricle has no regional wall motion abnormalities. Left ventricular diastolic parameters are  consistent with Grade II diastolic dysfunction (pseudonormalization). There is the interventricular septum is flattened in systole and diastole, consistent with right ventricular pressure and volume overload.  2. Right ventricular systolic function is mildly reduced. The right ventricular size is moderately enlarged. There is severely elevated pulmonary artery systolic pressure. The estimated right ventricular systolic pressure is 82.6 mmHg.  3. The mitral valve is normal  Physician Discharge Summary   Patient: [name] MRN: [id number] DOB: [birth year]  Admit date:     07/18/2023  Discharge date: 07/24/23  Discharge Physician: Jonah Blue   PCP: Larena Glassman, PA   Recommendations at discharge:   Follow up with GYN on 9/30 at 3:15 Follow up with cardiology Follow up with PA [facility] in 1-2 weeks Follow up with outpatient pulmonary function tests  Discharge Diagnoses: Active Problems:   S/P hysterectomy   Hepatic steatosis   Pulmonary HTN (HCC)   CAD S/P percutaneous coronary angioplasty   Hospital Course: 47yo with h/o menorrhagia who presented on 9/12 for scheduled hysterectomy but complained of CP/SOB post-operatively with O2 sats in 70-80s.  CTA negative for PE.  Echo with preserved EF, grade 2 diastolic dysfunction and evidence of R heart failure with moderate to severe TR.  Cardiology consulted and she underwent R/L HC on 9/16 which showed mild non-obstructive CAD and moderate PAH with moderate to severely reduced CO.  She was started on Digoxin and combination therapy for PAH.  Assessment and Plan:    S/P hysterectomy Patient presented for scheduled laparoscopic vaginal hysterectomy with right salpingectomy on 9/12 This was performed without complication but she was SBO and hypoxic post-procedure Oxycodone as needed for pain control Management per OB/GYN  Pulmonary HTN with cor pulmonale Post-operatively developed acute hypoxic respiratory failure with O2 sats in 70s-80s Patient states that she has been experiencing several week history of shortness of breath with a sensation of chest tightness when exerting herself  She was subsequently found to have right heart failure resulting from [facility] She has been started on therapy for this issue with improvement - sildenafil and Digoxin (held due to marginal BP this AM), macitentan pending insurance approval Currently on RA Completed VQ scan on 9/17, unremarkable Appropriate for dc  today Will need outpatient PFTs  CAD S/P percutaneous coronary angioplasty Patient reported several weeks of CP prior to procedure LHC with mild nonobstructive CAD Started empirically on rosuvastatin   Hepatic steatosis Likely in the setting of obesity          Consultants: OB/GYN Cardiology   Procedures: Echocardiogram 9/13 R/L heart catheterization 9/16 VQ Scan 9/17   Antibiotics: None      30 Day Unplanned Readmission Risk Score    Flowsheet Row Admission (Current) from 07/18/2023 in [facility]  30 Day Unplanned Readmission Risk Score (%) 8.78 Filed at 07/24/2023 0801       This score is the patient's risk of an unplanned readmission within 30 days of being discharged (0 -100%). The score is based on dignosis, age, lab data, medications, orders, and past utilization.   Low:  0-14.9   Medium: 15-21.9   High: 22-29.9   Extreme: 30 and above           Pain control - [place] Controlled Substance Reporting System database was reviewed. and patient was instructed, not to drive, operate heavy machinery, perform activities at heights, swimming or participation in water activities or provide baby-sitting services while on Pain, Sleep and Anxiety Medications; until their outpatient Physician has advised to do so again. Also recommended to not to take more than prescribed Pain, Sleep and Anxiety Medications.     Disposition: Home Diet recommendation:  Discharge Diet Orders (From admission, onward)     Start     Ordered   07/18/23 0000  Diet general        07/18/23 1106  NUCLEAR MEDICINE PERFUSION LUNG SCAN TECHNIQUE: Perfusion images were obtained in multiple projections after intravenous injection of radiopharmaceutical. Ventilation scans intentionally deferred if perfusion scan and chest x-ray adequate for interpretation during COVID 19 epidemic. RADIOPHARMACEUTICALS:  4.24 mCi Tc-70m MAA IV COMPARISON:  Chest radiograph earlier today.  Chest CTA 07/18/2023 FINDINGS: Slight heterogeneous distribution of radiotracer, increased in the posterior lung bases. No wedge-shaped perfusion defects to suggest pulmonary embolus. IMPRESSION: Slight heterogeneous pulmonary perfusion but no wedge-shaped perfusion defects to suggest pulmonary embolus. Electronically Signed   By: Narda Rutherford M.D.   On: 07/23/2023 16:55   DG Chest 2 View  Result Date: 07/23/2023 CLINICAL DATA:  Pulmonary hypertension EXAM: CHEST - 2 VIEW COMPARISON:  Chest radiograph 07/18/2023 FINDINGS: The cardiomediastinal  silhouette is normal There is no focal consolidation or pulmonary edema. There is no pleural effusion or pneumothorax There is no acute osseous abnormality. IMPRESSION: No radiographic evidence of acute cardiopulmonary process. Electronically Signed   By: Lesia Hausen M.D.   On: 07/23/2023 13:55   CARDIAC CATHETERIZATION  Result Date: 07/22/2023   Mid LAD lesion is 40% stenosed.   The left ventricular ejection fraction is 55-65% by visual estimate. Findings: Ao = 102/71 (85) LV = 101/6 RA = 4 RV = 70/7 PA = 71/27 (41) PCW = 4 Fick cardiac output/index = 3.6/2.1 Thermo CO/CI = 3.2/1.8 PVR = 10.2 WU FA sat = 95% PA sat = 57%, 59% PAPi = 11 Assessment: 1. Mild non-obstructive CAD 2. LVEF 55-60% 3. Moderate PAH with moderate to severely reduced CO Plan/Discussion: Start combination therapy for PAH. Start digoxin. Stop diuretics. Arvilla Meres, MD 5:05 PM  ECHOCARDIOGRAM COMPLETE  Result Date: 07/19/2023    ECHOCARDIOGRAM REPORT   Patient Name:   [name] Date of Exam: 07/19/2023 Medical Rec #:  [id number]    Height:       63.0 in Accession #:    [id number]   Weight:       164.0 lb Date of Birth:  1976-08-11    BSA:          1.777 m Patient Age:    47 years     BP:           113/78 mmHg Patient Gender: F            HR:           107 bpm. Exam Location:  [facility] Procedure: 2D Echo, Cardiac Doppler and Color Doppler Indications:     Chest pain  History:         Patient has prior history of Echocardiogram examinations, most                  recent 03/02/2021. Signs/Symptoms:Chest Pain and Fatigue.  Sonographer:     Mikki Harbor Referring Phys:  [id number] Verdene Lennert Diagnosing Phys: Debbe Odea MD  Sonographer Comments: Image acquisition challenging due to respiratory motion. IMPRESSIONS  1. Left ventricular ejection fraction, by estimation, is 55 to 60%. The left ventricle has normal function. The left ventricle has no regional wall motion abnormalities. Left ventricular diastolic parameters are  consistent with Grade II diastolic dysfunction (pseudonormalization). There is the interventricular septum is flattened in systole and diastole, consistent with right ventricular pressure and volume overload.  2. Right ventricular systolic function is mildly reduced. The right ventricular size is moderately enlarged. There is severely elevated pulmonary artery systolic pressure. The estimated right ventricular systolic pressure is 82.6 mmHg.  3. The mitral valve is normal  NUCLEAR MEDICINE PERFUSION LUNG SCAN TECHNIQUE: Perfusion images were obtained in multiple projections after intravenous injection of radiopharmaceutical. Ventilation scans intentionally deferred if perfusion scan and chest x-ray adequate for interpretation during COVID 19 epidemic. RADIOPHARMACEUTICALS:  4.24 mCi Tc-70m MAA IV COMPARISON:  Chest radiograph earlier today.  Chest CTA 07/18/2023 FINDINGS: Slight heterogeneous distribution of radiotracer, increased in the posterior lung bases. No wedge-shaped perfusion defects to suggest pulmonary embolus. IMPRESSION: Slight heterogeneous pulmonary perfusion but no wedge-shaped perfusion defects to suggest pulmonary embolus. Electronically Signed   By: Narda Rutherford M.D.   On: 07/23/2023 16:55   DG Chest 2 View  Result Date: 07/23/2023 CLINICAL DATA:  Pulmonary hypertension EXAM: CHEST - 2 VIEW COMPARISON:  Chest radiograph 07/18/2023 FINDINGS: The cardiomediastinal  silhouette is normal There is no focal consolidation or pulmonary edema. There is no pleural effusion or pneumothorax There is no acute osseous abnormality. IMPRESSION: No radiographic evidence of acute cardiopulmonary process. Electronically Signed   By: Lesia Hausen M.D.   On: 07/23/2023 13:55   CARDIAC CATHETERIZATION  Result Date: 07/22/2023   Mid LAD lesion is 40% stenosed.   The left ventricular ejection fraction is 55-65% by visual estimate. Findings: Ao = 102/71 (85) LV = 101/6 RA = 4 RV = 70/7 PA = 71/27 (41) PCW = 4 Fick cardiac output/index = 3.6/2.1 Thermo CO/CI = 3.2/1.8 PVR = 10.2 WU FA sat = 95% PA sat = 57%, 59% PAPi = 11 Assessment: 1. Mild non-obstructive CAD 2. LVEF 55-60% 3. Moderate PAH with moderate to severely reduced CO Plan/Discussion: Start combination therapy for PAH. Start digoxin. Stop diuretics. Arvilla Meres, MD 5:05 PM  ECHOCARDIOGRAM COMPLETE  Result Date: 07/19/2023    ECHOCARDIOGRAM REPORT   Patient Name:   [name] Date of Exam: 07/19/2023 Medical Rec #:  [id number]    Height:       63.0 in Accession #:    [id number]   Weight:       164.0 lb Date of Birth:  1976-08-11    BSA:          1.777 m Patient Age:    47 years     BP:           113/78 mmHg Patient Gender: F            HR:           107 bpm. Exam Location:  [facility] Procedure: 2D Echo, Cardiac Doppler and Color Doppler Indications:     Chest pain  History:         Patient has prior history of Echocardiogram examinations, most                  recent 03/02/2021. Signs/Symptoms:Chest Pain and Fatigue.  Sonographer:     Mikki Harbor Referring Phys:  [id number] Verdene Lennert Diagnosing Phys: Debbe Odea MD  Sonographer Comments: Image acquisition challenging due to respiratory motion. IMPRESSIONS  1. Left ventricular ejection fraction, by estimation, is 55 to 60%. The left ventricle has normal function. The left ventricle has no regional wall motion abnormalities. Left ventricular diastolic parameters are  consistent with Grade II diastolic dysfunction (pseudonormalization). There is the interventricular septum is flattened in systole and diastole, consistent with right ventricular pressure and volume overload.  2. Right ventricular systolic function is mildly reduced. The right ventricular size is moderately enlarged. There is severely elevated pulmonary artery systolic pressure. The estimated right ventricular systolic pressure is 82.6 mmHg.  3. The mitral valve is normal  NUCLEAR MEDICINE PERFUSION LUNG SCAN TECHNIQUE: Perfusion images were obtained in multiple projections after intravenous injection of radiopharmaceutical. Ventilation scans intentionally deferred if perfusion scan and chest x-ray adequate for interpretation during COVID 19 epidemic. RADIOPHARMACEUTICALS:  4.24 mCi Tc-70m MAA IV COMPARISON:  Chest radiograph earlier today.  Chest CTA 07/18/2023 FINDINGS: Slight heterogeneous distribution of radiotracer, increased in the posterior lung bases. No wedge-shaped perfusion defects to suggest pulmonary embolus. IMPRESSION: Slight heterogeneous pulmonary perfusion but no wedge-shaped perfusion defects to suggest pulmonary embolus. Electronically Signed   By: Narda Rutherford M.D.   On: 07/23/2023 16:55   DG Chest 2 View  Result Date: 07/23/2023 CLINICAL DATA:  Pulmonary hypertension EXAM: CHEST - 2 VIEW COMPARISON:  Chest radiograph 07/18/2023 FINDINGS: The cardiomediastinal  silhouette is normal There is no focal consolidation or pulmonary edema. There is no pleural effusion or pneumothorax There is no acute osseous abnormality. IMPRESSION: No radiographic evidence of acute cardiopulmonary process. Electronically Signed   By: Lesia Hausen M.D.   On: 07/23/2023 13:55   CARDIAC CATHETERIZATION  Result Date: 07/22/2023   Mid LAD lesion is 40% stenosed.   The left ventricular ejection fraction is 55-65% by visual estimate. Findings: Ao = 102/71 (85) LV = 101/6 RA = 4 RV = 70/7 PA = 71/27 (41) PCW = 4 Fick cardiac output/index = 3.6/2.1 Thermo CO/CI = 3.2/1.8 PVR = 10.2 WU FA sat = 95% PA sat = 57%, 59% PAPi = 11 Assessment: 1. Mild non-obstructive CAD 2. LVEF 55-60% 3. Moderate PAH with moderate to severely reduced CO Plan/Discussion: Start combination therapy for PAH. Start digoxin. Stop diuretics. Arvilla Meres, MD 5:05 PM  ECHOCARDIOGRAM COMPLETE  Result Date: 07/19/2023    ECHOCARDIOGRAM REPORT   Patient Name:   [name] Date of Exam: 07/19/2023 Medical Rec #:  [id number]    Height:       63.0 in Accession #:    [id number]   Weight:       164.0 lb Date of Birth:  1976-08-11    BSA:          1.777 m Patient Age:    47 years     BP:           113/78 mmHg Patient Gender: F            HR:           107 bpm. Exam Location:  [facility] Procedure: 2D Echo, Cardiac Doppler and Color Doppler Indications:     Chest pain  History:         Patient has prior history of Echocardiogram examinations, most                  recent 03/02/2021. Signs/Symptoms:Chest Pain and Fatigue.  Sonographer:     Mikki Harbor Referring Phys:  [id number] Verdene Lennert Diagnosing Phys: Debbe Odea MD  Sonographer Comments: Image acquisition challenging due to respiratory motion. IMPRESSIONS  1. Left ventricular ejection fraction, by estimation, is 55 to 60%. The left ventricle has normal function. The left ventricle has no regional wall motion abnormalities. Left ventricular diastolic parameters are  consistent with Grade II diastolic dysfunction (pseudonormalization). There is the interventricular septum is flattened in systole and diastole, consistent with right ventricular pressure and volume overload.  2. Right ventricular systolic function is mildly reduced. The right ventricular size is moderately enlarged. There is severely elevated pulmonary artery systolic pressure. The estimated right ventricular systolic pressure is 82.6 mmHg.  3. The mitral valve is normal  Physician Discharge Summary   Patient: [name] MRN: [id number] DOB: [birth year]  Admit date:     07/18/2023  Discharge date: 07/24/23  Discharge Physician: Jonah Blue   PCP: Larena Glassman, PA   Recommendations at discharge:   Follow up with GYN on 9/30 at 3:15 Follow up with cardiology Follow up with PA [facility] in 1-2 weeks Follow up with outpatient pulmonary function tests  Discharge Diagnoses: Active Problems:   S/P hysterectomy   Hepatic steatosis   Pulmonary HTN (HCC)   CAD S/P percutaneous coronary angioplasty   Hospital Course: 47yo with h/o menorrhagia who presented on 9/12 for scheduled hysterectomy but complained of CP/SOB post-operatively with O2 sats in 70-80s.  CTA negative for PE.  Echo with preserved EF, grade 2 diastolic dysfunction and evidence of R heart failure with moderate to severe TR.  Cardiology consulted and she underwent R/L HC on 9/16 which showed mild non-obstructive CAD and moderate PAH with moderate to severely reduced CO.  She was started on Digoxin and combination therapy for PAH.  Assessment and Plan:    S/P hysterectomy Patient presented for scheduled laparoscopic vaginal hysterectomy with right salpingectomy on 9/12 This was performed without complication but she was SBO and hypoxic post-procedure Oxycodone as needed for pain control Management per OB/GYN  Pulmonary HTN with cor pulmonale Post-operatively developed acute hypoxic respiratory failure with O2 sats in 70s-80s Patient states that she has been experiencing several week history of shortness of breath with a sensation of chest tightness when exerting herself  She was subsequently found to have right heart failure resulting from [facility] She has been started on therapy for this issue with improvement - sildenafil and Digoxin (held due to marginal BP this AM), macitentan pending insurance approval Currently on RA Completed VQ scan on 9/17, unremarkable Appropriate for dc  today Will need outpatient PFTs  CAD S/P percutaneous coronary angioplasty Patient reported several weeks of CP prior to procedure LHC with mild nonobstructive CAD Started empirically on rosuvastatin   Hepatic steatosis Likely in the setting of obesity          Consultants: OB/GYN Cardiology   Procedures: Echocardiogram 9/13 R/L heart catheterization 9/16 VQ Scan 9/17   Antibiotics: None      30 Day Unplanned Readmission Risk Score    Flowsheet Row Admission (Current) from 07/18/2023 in [facility]  30 Day Unplanned Readmission Risk Score (%) 8.78 Filed at 07/24/2023 0801       This score is the patient's risk of an unplanned readmission within 30 days of being discharged (0 -100%). The score is based on dignosis, age, lab data, medications, orders, and past utilization.   Low:  0-14.9   Medium: 15-21.9   High: 22-29.9   Extreme: 30 and above           Pain control - [place] Controlled Substance Reporting System database was reviewed. and patient was instructed, not to drive, operate heavy machinery, perform activities at heights, swimming or participation in water activities or provide baby-sitting services while on Pain, Sleep and Anxiety Medications; until their outpatient Physician has advised to do so again. Also recommended to not to take more than prescribed Pain, Sleep and Anxiety Medications.     Disposition: Home Diet recommendation:  Discharge Diet Orders (From admission, onward)     Start     Ordered   07/18/23 0000  Diet general        07/18/23 1106  Physician Discharge Summary   Patient: [name] MRN: [id number] DOB: [birth year]  Admit date:     07/18/2023  Discharge date: 07/24/23  Discharge Physician: Jonah Blue   PCP: Larena Glassman, PA   Recommendations at discharge:   Follow up with GYN on 9/30 at 3:15 Follow up with cardiology Follow up with PA [facility] in 1-2 weeks Follow up with outpatient pulmonary function tests  Discharge Diagnoses: Active Problems:   S/P hysterectomy   Hepatic steatosis   Pulmonary HTN (HCC)   CAD S/P percutaneous coronary angioplasty   Hospital Course: 47yo with h/o menorrhagia who presented on 9/12 for scheduled hysterectomy but complained of CP/SOB post-operatively with O2 sats in 70-80s.  CTA negative for PE.  Echo with preserved EF, grade 2 diastolic dysfunction and evidence of R heart failure with moderate to severe TR.  Cardiology consulted and she underwent R/L HC on 9/16 which showed mild non-obstructive CAD and moderate PAH with moderate to severely reduced CO.  She was started on Digoxin and combination therapy for PAH.  Assessment and Plan:    S/P hysterectomy Patient presented for scheduled laparoscopic vaginal hysterectomy with right salpingectomy on 9/12 This was performed without complication but she was SBO and hypoxic post-procedure Oxycodone as needed for pain control Management per OB/GYN  Pulmonary HTN with cor pulmonale Post-operatively developed acute hypoxic respiratory failure with O2 sats in 70s-80s Patient states that she has been experiencing several week history of shortness of breath with a sensation of chest tightness when exerting herself  She was subsequently found to have right heart failure resulting from [facility] She has been started on therapy for this issue with improvement - sildenafil and Digoxin (held due to marginal BP this AM), macitentan pending insurance approval Currently on RA Completed VQ scan on 9/17, unremarkable Appropriate for dc  today Will need outpatient PFTs  CAD S/P percutaneous coronary angioplasty Patient reported several weeks of CP prior to procedure LHC with mild nonobstructive CAD Started empirically on rosuvastatin   Hepatic steatosis Likely in the setting of obesity          Consultants: OB/GYN Cardiology   Procedures: Echocardiogram 9/13 R/L heart catheterization 9/16 VQ Scan 9/17   Antibiotics: None      30 Day Unplanned Readmission Risk Score    Flowsheet Row Admission (Current) from 07/18/2023 in [facility]  30 Day Unplanned Readmission Risk Score (%) 8.78 Filed at 07/24/2023 0801       This score is the patient's risk of an unplanned readmission within 30 days of being discharged (0 -100%). The score is based on dignosis, age, lab data, medications, orders, and past utilization.   Low:  0-14.9   Medium: 15-21.9   High: 22-29.9   Extreme: 30 and above           Pain control - [place] Controlled Substance Reporting System database was reviewed. and patient was instructed, not to drive, operate heavy machinery, perform activities at heights, swimming or participation in water activities or provide baby-sitting services while on Pain, Sleep and Anxiety Medications; until their outpatient Physician has advised to do so again. Also recommended to not to take more than prescribed Pain, Sleep and Anxiety Medications.     Disposition: Home Diet recommendation:  Discharge Diet Orders (From admission, onward)     Start     Ordered   07/18/23 0000  Diet general        07/18/23 1106  Physician Discharge Summary   Patient: [name] MRN: [id number] DOB: [birth year]  Admit date:     07/18/2023  Discharge date: 07/24/23  Discharge Physician: Jonah Blue   PCP: Larena Glassman, PA   Recommendations at discharge:   Follow up with GYN on 9/30 at 3:15 Follow up with cardiology Follow up with [facility] in 1-2 weeks Follow up with outpatient pulmonary function tests  Discharge Diagnoses: Active Problems:   S/P hysterectomy   Hepatic steatosis   Pulmonary HTN (HCC)   CAD S/P percutaneous coronary angioplasty   Hospital Course: 47yo with h/o menorrhagia who presented on 9/12 for scheduled hysterectomy but complained of CP/SOB post-operatively with O2 sats in 70-80s.  CTA negative for PE.  Echo with preserved EF, grade 2 diastolic dysfunction and evidence of R heart failure with moderate to severe TR.  Cardiology consulted and she underwent R/L HC on 9/16 which showed mild non-obstructive CAD and moderate PAH with moderate to severely reduced CO.  She was started on Digoxin and combination therapy for PAH.  Assessment and Plan:    S/P hysterectomy Patient presented for scheduled laparoscopic vaginal hysterectomy with right salpingectomy on 9/12 This was performed without complication but she was SBO and hypoxic post-procedure Oxycodone as needed for pain control Management per OB/GYN  Pulmonary HTN with cor pulmonale Post-operatively developed acute hypoxic respiratory failure with O2 sats in 70s-80s Patient states that she has been experiencing several week history of shortness of breath with a sensation of chest tightness when exerting herself  She was subsequently found to have right heart failure resulting from [facility] She has been started on therapy for this issue with improvement - sildenafil and Digoxin (held due to marginal BP this AM), macitentan pending insurance approval Currently on RA Completed VQ scan on 9/17, unremarkable Appropriate for dc  today Will need outpatient PFTs  CAD S/P percutaneous coronary angioplasty Patient reported several weeks of CP prior to procedure LHC with mild nonobstructive CAD Started empirically on rosuvastatin   Hepatic steatosis Likely in the setting of obesity          Consultants: OB/GYN Cardiology   Procedures: Echocardiogram 9/13 R/L heart catheterization 9/16 VQ Scan 9/17   Antibiotics: None      30 Day Unplanned Readmission Risk Score    Flowsheet Row Admission (Current) from 07/18/2023 in [facility]  30 Day Unplanned Readmission Risk Score (%) 8.78 Filed at 07/24/2023 0801       This score is the patient's risk of an unplanned readmission within 30 days of being discharged (0 -100%). The score is based on dignosis, age, lab data, medications, orders, and past utilization.   Low:  0-14.9   Medium: 15-21.9   High: 22-29.9   Extreme: 30 and above           Pain control - [place] Controlled Substance Reporting System database was reviewed. and patient was instructed, not to drive, operate heavy machinery, perform activities at heights, swimming or participation in water activities or provide baby-sitting services while on Pain, Sleep and Anxiety Medications; until their outpatient Physician has advised to do so again. Also recommended to not to take more than prescribed Pain, Sleep and Anxiety Medications.     Disposition: Home Diet recommendation:  Discharge Diet Orders (From admission, onward)     Start     Ordered   07/18/23 0000  Diet general        07/18/23 1106

## 2023-07-24 NOTE — Plan of Care (Signed)
  Problem: Education: Goal: Understanding of cardiac disease, CV risk reduction, and recovery process will improve Outcome: Progressing   Problem: Activity: Goal: Ability to tolerate increased activity will improve Outcome: Progressing   Problem: Health Behavior/Discharge Planning: Goal: Ability to manage health-related needs will improve Outcome: Progressing   Problem: Clinical Measurements: Goal: Ability to maintain clinical measurements within normal limits will improve Outcome: Progressing Goal: Will remain free from infection Outcome: Progressing Goal: Diagnostic test results will improve Outcome: Progressing Goal: Respiratory complications will improve Outcome: Progressing   Problem: Activity: Goal: Risk for activity intolerance will decrease Outcome: Progressing   Problem: Nutrition: Goal: Adequate nutrition will be maintained Outcome: Progressing

## 2023-07-24 NOTE — Progress Notes (Signed)
Patient is ready for discharge.I have reviewed AVS discharge instructions with patient  and answered all questions regarding discharge. I have removed IV's from right and left hands without complication. Patient has been taken off telemetry. She has her medication from pharmacy and all of her belongings. She will be taken down to the lobby by wheelchair to meet her mother in the lobby and she will be transported home by her mother.

## 2023-07-24 NOTE — Progress Notes (Signed)
Advanced Heart Failure Rounding Note  PCP-Cardiologist: None   Subjective:    RHC/LHC with results as below.   Still notes some occasional dizziness with standing, though improving as she gets out of bed more. We discussed her V/Q scan results, answered medication questions, and the importance of close follow up.   Cath 9/16 Ao = 102/71 (85) LV = 101/6 RA = 4  RV = 70/7 PA = 71/27 (41) PCW = 4 Fick cardiac output/index = 3.6/2.1 Thermo CO/CI = 3.2/1.8 PVR = 10.2 WU FA sat = 95% PA sat = 57%, 59% PAPi = 11    Assessment: 1. Mild non-obstructive CAD  2. LVEF 55-60% 3. Moderate PAH with moderate to severely reduced CO   Objective:   Weight Range: 73.9 kg Body mass index is 28.86 kg/m.   Vital Signs:   Temp:  [97.8 F (36.6 C)-98.2 F (36.8 C)] 97.8 F (36.6 C) (09/18 0826) Pulse Rate:  [77-93] 77 (09/18 0826) Resp:  [14-23] 23 (09/18 0826) BP: (96-102)/(65-75) 96/75 (09/18 0826) SpO2:  [90 %-98 %] 98 % (09/18 0826) Last BM Date : 07/23/23  Weight change: Filed Weights   07/18/23 0631 07/22/23 0459  Weight: 74.4 kg 73.9 kg    Intake/Output:   Intake/Output Summary (Last 24 hours) at 07/24/2023 1153 Last data filed at 07/24/2023 1100 Gross per 24 hour  Intake 240 ml  Output 1 ml  Net 239 ml      Physical Exam    General: Sitting up in bed  No resp difficulty HEENT: Normal Neck: Supple. JVP . Carotids 2+ bilat; no bruits. No lymphadenopathy or thyromegaly appreciated. Cor: PMI nondisplaced. Regular rate & rhythm. 1/6 systolic murmur at RUSB Lungs: Clear Abdomen: Soft, nontender, nondistended. No hepatosplenomegaly. No bruits or masses. Good bowel sounds. Extremities: No cyanosis, clubbing, rash, edema Neuro: Alert & orientedx3, cranial nerves grossly intact. moves all 4 extremities w/o difficulty. Affect pleasant   Telemetry   Sinus 80-90s Personally reviewed   Labs    Labs and imaging reviewed in Epic    Medications:      Scheduled Medications:  acetaminophen  650 mg Oral Q8H   digoxin  0.125 mg Oral Daily   enoxaparin (LOVENOX) injection  40 mg Subcutaneous Q24H   polyethylene glycol  17 g Oral Daily   rosuvastatin  10 mg Oral Daily   senna  1 tablet Oral Daily   sildenafil  20 mg Oral TID   sodium chloride flush  3 mL Intravenous Q12H    Infusions:  sodium chloride      PRN Medications: sodium chloride, acetaminophen, ALPRAZolam, docusate sodium, levalbuterol, ondansetron (ZOFRAN) IV, ondansetron, mouth rinse, oxyCODONE, sodium chloride flush  Assessment/Plan   1.  PAH with cor pulmonale - Likely WHO Group I (idiopathic PAH) but also has some RFs for WHO Group III component - ECHO  9/24 LVEF 55-60% G2DD. RV mild to moderately reduced function with septal flattening. Mod-sev TR RVSP 83 - CT negative for PE, V/Q scan with some mild heterogenity but no definitive sign of chronic PE - RA 4 PA 71/27 (41) PCW  4 Fick 3.6/2.1 Thermo CO/CI 3.2/1.8 PVR 10.2 WU PAPi 11  - CTD serologies + VQ, PFTs with DLCO pending, discussed timing with nursing staff - Sleep study at discharge - Continue sildenafil - PA placed for macitentan - Avoid diuretics (likely a little dry).  - Ok for discharge today   2. Chest tightness/CAD - mild CAD on cath LAD 40%  -  start statin  Length of Stay: 5  Romie Minus, MD  07/24/2023, 11:53 AM  Advanced Heart Failure Team

## 2023-07-25 LAB — ANTINUCLEAR ANTIBODIES, IFA: ANA Ab, IFA: NEGATIVE

## 2023-07-29 ENCOUNTER — Ambulatory Visit: Payer: Managed Care, Other (non HMO) | Attending: Cardiology | Admitting: Cardiology

## 2023-07-29 VITALS — BP 143/87 | HR 90 | Ht 63.0 in | Wt 171.0 lb

## 2023-07-29 DIAGNOSIS — I272 Pulmonary hypertension, unspecified: Secondary | ICD-10-CM

## 2023-07-29 DIAGNOSIS — I50810 Right heart failure, unspecified: Secondary | ICD-10-CM

## 2023-07-29 DIAGNOSIS — E785 Hyperlipidemia, unspecified: Secondary | ICD-10-CM | POA: Diagnosis not present

## 2023-07-29 MED ORDER — SILDENAFIL CITRATE 20 MG PO TABS: 40.0000 mg | ORAL_TABLET | Freq: Three times a day (TID) | ORAL | 6 refills | Status: DC

## 2023-07-29 MED ORDER — OPSUMIT 10 MG PO TABS: 10.0000 mg | ORAL_TABLET | Freq: Every day | ORAL | 6 refills | Status: DC

## 2023-07-29 NOTE — Patient Instructions (Addendum)
Medication Changes:  Increase sildenafil 40 mg (2 tablets) 3 times a day.   Start Opsumit 10 mg (1 tablet) daily.  Lab Work:  Labs done today, your results will be available in MyChart, we will contact you for abnormal readings.   Testing/Procedures: Your provider has recommended that you have a home sleep study ([facility] Test).  We have provided you with the equipment in our office today. Please go ahead and download the app. DO NOT OPEN OR TAMPER WITH THE BOX UNTIL WE ADVISE YOU TO DO SO. Once insurance has approved the test our office will call you with PIN number and approval to proceed with testing. Once you have completed the test you just dispose of the equipment, the information is automatically uploaded to [place] via blue-tooth technology. If your test is positive for sleep apnea and you need a home CPAP machine you will be contacted by Dr Norris Cross office [facility]) to set this up.   You will have a pulmonary function test.     Special Instructions // Education:  Do the following things EVERYDAY: Weigh yourself in the morning before breakfast. Write it down and keep it in a log. Take your medicines as prescribed Eat low salt foods--Limit salt (sodium) to 2000 mg per day.  Stay as active as you can everyday Limit all fluids for the day to less than 2 liters   Follow-Up in: please follow up in 4 weeks with Dr. Gala Romney.  You have an appointment with our pharmacist for medication titration in 2 weeks.    If you have any questions or concerns before your next appointment please send [place] a message through [place] or call our office at [phone] Monday-Friday 8 am-5 pm.   If you have an urgent need after hours on the weekend please call your Primary Cardiologist or the Advanced Heart Failure Clinic in [place] at [phone].

## 2023-07-29 NOTE — Addendum Note (Signed)
Addended by: Electa Sniff on: 07/29/2023 10:25 AM   Modules accepted: Orders

## 2023-07-29 NOTE — Progress Notes (Signed)
ADVANCED HEART FAILURE CLINIC NOTE  Referring Physician: Larena Glassman, PA  Primary Care: Larena Glassman, PA HF: Dr. Gala Romney    HPI: [name] is a 47 y.o. 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  According to [name] she has had sinus tachycardia for many years.  She had an echocardiogram in April 2022 with normal LV/RV function and an RVSP of 36 mmHg.  Over the past 1 year (2023-2024) she has had progressive dyspnea on exertion, lower extremity edema and bloating.  On 07/18/2023 she underwent elective hysterectomy which was complicated post procedurally by hypoxia and near syncope.  Postoperative CTA chest negative for PE.  Echocardiogram with EF of 55 to 60%, grade 2 diastolic dysfunction and moderately enlarged right ventricle with PASP of 83 mmHg with moderate to severe TR.  She was started on sildenafil and patient and presents today for follow-up.  Interval history - She feels the same since starting sildenafil 20mg  TID. She does report having chest heaviness; similar episodes in July 2024 when she was diagnosed with 'pneumonia'.  - Reports becoming mildly short of breath when performing some ADLs (I.e changing clothes at home, showering, etc).  - No episodes of lightheadedness, syncope, PND/orthopnea.   Activity level/exercise tolerance:  NYHA IIB-III Orthopnea:  Sleeps on 1-2 pillows Paroxysmal noctural dyspnea:  no Chest pain/pressure:  Yes, stable.  Orthostatic lightheadedness:  no Palpitations:  no Lower extremity edema:  trace  Presyncope/syncope:  no Cough:  no  Past Medical History:  Diagnosis Date   Abnormal thyroid function test    Anemia    Anxiety    Bronchitis 06/2023   finished prednisone and currently taking Amoxicillin as of 06-12-23   Cervical lymphadenopathy    Chronic back pain    Ectopic pregnancy    Headache    MIGRAINES   History of kidney stones     HSV infection    Lumbar radiculopathy    Menorrhagia    Palpitations    Pneumonia    06/2023   PONV (postoperative nausea and vomiting)    DURING KIDNEY STONE REMOVAL   Seizures (HCC) 2014   one occurence-due to Tramadol   UTI (urinary tract infection)     Current Outpatient Medications  Medication Sig Dispense Refill   digoxin (LANOXIN) 0.125 MG tablet Take 1 tablet (0.125 mg total) by mouth daily. 30 tablet 1   oxyCODONE (OXY IR/ROXICODONE) 5 MG immediate release tablet Take 1 tablet (5 mg total) by mouth every 6 (six) hours as needed for moderate pain, severe pain or breakthrough pain. 10 tablet 0   rosuvastatin (CRESTOR) 10 MG tablet Take 1 tablet (10 mg total) by mouth daily. 30 tablet 1   sildenafil (REVATIO) 20 MG tablet Take 1 tablet (20 mg total) by mouth 3 (three) times daily. 90 tablet 1   albuterol (VENTOLIN HFA) 108 (90 Base) MCG/ACT inhaler Inhale 1-2 puffs into the lungs every 6 (six) hours as needed for wheezing or shortness of breath (was just given this for bronchitis). (Patient not taking: Reported on 07/29/2023)     benzonatate (TESSALON) 200 MG capsule Take 200 mg by mouth 3 (three) times daily. (Patient not taking: Reported on 07/29/2023)     No current facility-administered medications for this visit.    Allergies  Allergen Reactions   Sulfa Antibiotics Hives and Itching   Tramadol Other (See Comments)    seizures   Compazine [Prochlorperazine] Anxiety  Tachycardia       Social History   Socioeconomic History   Marital status: Divorced    Spouse name: Not on file   Number of children: Not on file   Years of education: Not on file   Highest education level: Not on file  Occupational History   Not on file  Tobacco Use   Smoking status: Former    Current packs/day: 0.00    Types: Cigarettes    Quit date: 10/21/2019    Years since quitting: 3.7   Smokeless tobacco: Never  Vaping Use   Vaping status: Former   Quit date: 06/06/2023    Substances: Nicotine, Flavoring  Substance and Sexual Activity   Alcohol use: No   Drug use: No   Sexual activity: Yes  Other Topics Concern   Not on file  Social History Narrative   Not on file   Social Determinants of Health   Financial Resource Strain: Not on file  Food Insecurity: No Food Insecurity (07/21/2023)   Hunger Vital Sign    Worried About Running Out of Food in the Last Year: Never true    Ran Out of Food in the Last Year: Never true  Transportation Needs: No Transportation Needs (07/21/2023)   PRAPARE - Administrator, Civil Service (Medical): No    Lack of Transportation (Non-Medical): No  Physical Activity: Not on file  Stress: Not on file  Social Connections: Not on file  Intimate Partner Violence: Not At Risk (07/21/2023)   Humiliation, Afraid, Rape, and Kick questionnaire    Fear of Current or Ex-Partner: No    Emotionally Abused: No    Physically Abused: No    Sexually Abused: No      Family History  Problem Relation Age of Onset   Cancer Maternal Grandmother     PHYSICAL EXAM: Vitals:   07/29/23 0918  BP: (!) 143/87  Pulse: 90  SpO2: 98%   GENERAL: Well nourished, well developed, and in no apparent distress at rest.  HEENT: Negative for arcus senilis or xanthelasma. There is no scleral icterus.  The mucous membranes are pink and moist.   NECK: Supple, No masses. Normal carotid upstrokes without bruits. No masses or thyromegaly.    CHEST: There are no chest wall deformities. There is no chest wall tenderness. Respirations are unlabored.  Lungs- CTA B/L CARDIAC:  JVP: 7 cm H2O         Normal S1, S2  Normal rate with regular rhythm. No murmurs, rubs or gallops.  Pulses are 2+ and symmetrical in upper and lower extremities. Trace pretibial edema.  ABDOMEN: Soft, non-tender, non-distended. There are no masses or hepatomegaly. There are normal bowel sounds.  EXTREMITIES: Warm and well perfused with no cyanosis, clubbing.  LYMPHATIC: No  axillary or supraclavicular lymphadenopathy.  NEUROLOGIC: Patient is oriented x3 with no focal or lateralizing neurologic deficits.  PSYCH: Patients affect is appropriate, there is no evidence of anxiety or depression.  SKIN: Warm and dry; no lesions or wounds.   DATA REVIEW  ECG: 07/29/23: sinus tachycardia  as per my personal interpretation  ECHO: 07/19/23: LVEF 55-60%, Grade II DD, RV moderately enlarged with mild to moderately reduced function as per my personal interpretation  CATH: 9/16 Ao = 102/71 (85) LV = 101/6 RA = 4  RV = 70/7 PA = 71/27 (41) PCW = 4 Fick cardiac output/index = 3.6/2.1 Thermo CO/CI = 3.2/1.8 PVR = 10.2 WU FA sat = 95% PA sat = 57%,  59% PAPi = 11    Assessment: 1. Mild non-obstructive CAD  2. LVEF 55-60% 3. Moderate PAH with moderate to severely reduced CO  V/Q SCAN (07/23/23) Slight heterogeneous pulmonary perfusion but no wedge-shaped perfusion defects to suggest pulmonary embolus.    ASSESSMENT & PLAN:  Pulmonary Hypertension - Likely predominantly group 1 with some degree of group III  - ECHO  9/24 LVEF 55-60% G2DD. RV mild to moderately reduced function with septal flattening. Mod-sev TR RVSP 83 - CT negative for PE, V/Q scan with some mild heterogenity but no definitive sign of chronic PE - RA 4 PA 71/27 (41) PCW  4 Fick 3.6/2.1 Thermo CO/CI 3.2/1.8 PVR 10.2 WU PAPi 11  - CTD serologies: ESR 8, CRP 1, mildly elevated C3 complement at 193, negative anti-SCL, negative ANCA panel & negative ANA.  - V/Q scan negative.  - PFTs with DLCO pending - Reviewed CTA chest personally; does not appear to have much parenchymal lung disease. - Currently on sildenafil 20mg  TID; will increase to 40mg  TID - Opsumit 10mg  approved. Will send Rx today.  - Schedule sleep study.  - Scheduling PFTs.  - Plan for follow up in 3-4 weeks with pharmacy & Dr. Gala Romney  2. RV Failure - Dilated RV on TTE w/ moderately reduced function - Continue digoxin  daily; repeat level today.   3. Hyperlipidemia - crestor 10mg   4. Obesity - Mildly overweight; however, in the setting of RVF, PH will plan to start ozempic once PH meds are started.   Fowler Antos Advanced Heart Failure Mechanical Circulatory Support

## 2023-07-29 NOTE — Addendum Note (Signed)
Addended by: Electa Sniff on: 07/29/2023 10:28 AM   Modules accepted: Orders

## 2023-07-30 ENCOUNTER — Telehealth: Payer: Self-pay

## 2023-07-30 ENCOUNTER — Encounter: Payer: Managed Care, Other (non HMO) | Admitting: Cardiology

## 2023-07-30 LAB — COMPREHENSIVE METABOLIC PANEL
ALT: 17 IU/L (ref 0–32)
AST: 20 IU/L (ref 0–40)
Albumin: 4.2 g/dL (ref 3.9–4.9)
Alkaline Phosphatase: 64 IU/L (ref 44–121)
BUN/Creatinine Ratio: 13 (ref 9–23)
BUN: 9 mg/dL (ref 6–24)
Bilirubin Total: 0.4 mg/dL (ref 0.0–1.2)
CO2: 20 mmol/L (ref 20–29)
Calcium: 9.4 mg/dL (ref 8.7–10.2)
Chloride: 106 mmol/L (ref 96–106)
Creatinine, Ser: 0.72 mg/dL (ref 0.57–1.00)
Globulin, Total: 2.3 g/dL (ref 1.5–4.5)
Glucose: 116 mg/dL — ABNORMAL HIGH (ref 70–99)
Potassium: 4.5 mmol/L (ref 3.5–5.2)
Sodium: 142 mmol/L (ref 134–144)
Total Protein: 6.5 g/dL (ref 6.0–8.5)
eGFR: 102 mL/min/{1.73_m2} (ref >59)

## 2023-07-30 LAB — BRAIN NATRIURETIC PEPTIDE: BNP: 171.6 pg/mL — ABNORMAL HIGH (ref 0.0–100.0)

## 2023-07-30 LAB — DIGOXIN LEVEL: Digoxin, Serum: 0.9 ng/mL (ref 0.5–0.9)

## 2023-07-30 NOTE — Telephone Encounter (Signed)
Received [facility] enrollment confirmation (ID [id number]). Patient made aware.

## 2023-07-30 NOTE — Progress Notes (Signed)
Height: 5'3"    Weight:171LB BMI:30  Today's Date:07/29/23  STOP BANG RISK ASSESSMENT S (snore) Have you been told that you snore?     YES   T (tired) Are you often tired, fatigued, or sleepy during the day?   YES  O (obstruction) Do you stop breathing, choke, or gasp during sleep? NO   P (pressure) Do you have or are you being treated for high blood pressure? YES   B (BMI) Is your body index greater than 35 kg/m? NO   A (age) Are you 49 years old or older? NO   N (neck) Do you have a neck circumference greater than 16 inches?   NO   G (gender) Are you a female? NO   TOTAL STOP/BANG "YES" ANSWERS 3                                                                       For Office Use Only              Procedure Order Form    YES to 3+ Stop Bang questions OR two clinical symptoms - patient qualifies for [employer] (CPT 95800)      Clinical Notes: Will consult Sleep Specialist and refer for management of therapy due to patient increased risk of Sleep Apnea. Ordering a sleep study due to the following two clinical symptoms: Excessive daytime sleepiness G47.10 / Gastroesophageal reflux K21.9 / Nocturia R35.1 / Morning Headaches G44.221 / Difficulty concentrating R41.840 / Memory / Loud snoring R06.83 / / Unrefreshed by sleep G47.8 / History of high blood pressure R03.0

## 2023-08-05 ENCOUNTER — Telehealth: Payer: Self-pay

## 2023-08-05 ENCOUNTER — Telehealth: Payer: Self-pay | Admitting: Pharmacist

## 2023-08-05 MED ORDER — OPSUMIT 10 MG PO TABS: 10.0000 mg | ORAL_TABLET | Freq: Every day | ORAL | Status: DC

## 2023-08-05 NOTE — Telephone Encounter (Signed)
Pt called stating that the clinic pharmacist, Enos Fling, has been working with her on medication management, and had informed her a couple of weeks ago that she would receive medication in the mail. She reports she is still waiting to receive new medication, Opsumit, in the mail, and asks for updates. Message given to [facility] verbally. He states he will call to follow up.

## 2023-08-05 NOTE — Telephone Encounter (Signed)
Patient still has not received Opsumit. Called [facility] and they claim they have no prescription on file. Gave verbal prescription per Dr. Gala Romney for Opsumit 10 mg daily #30 with 11 refills.

## 2023-08-06 ENCOUNTER — Telehealth: Payer: Self-pay

## 2023-08-06 ENCOUNTER — Telehealth: Payer: Self-pay | Admitting: Pharmacist

## 2023-08-06 NOTE — Telephone Encounter (Signed)
Pt notified they are able to take the home sleep study. No insurance pre-authorization required. Pt informed PIN number for device is [id number]. Pt instructed to call if any questions or concerns arise. Pt verbalized understanding and agreement.

## 2023-08-06 NOTE — Telephone Encounter (Signed)
Pt is enrolled, Can proceed with medications for tx. Hours 8-8pm [place]. Ph no. [phone]

## 2023-08-08 NOTE — Telephone Encounter (Signed)
Returned call, still has not received Opsumit. Spent hours on the phone with [facility] and [facility] to find that they did not accept the paperwork because the provider signature was scribbled out of the DAW section and signed in the section for brand substitutions. New form has been sent.

## 2023-08-09 ENCOUNTER — Encounter (INDEPENDENT_AMBULATORY_CARE_PROVIDER_SITE_OTHER): Payer: Managed Care, Other (non HMO) | Admitting: Cardiology

## 2023-08-09 DIAGNOSIS — G4733 Obstructive sleep apnea (adult) (pediatric): Secondary | ICD-10-CM | POA: Diagnosis not present

## 2023-08-09 NOTE — Progress Notes (Unsigned)
Advanced Heart Failure Clinic Note  PCP: Larena Glassman, PA  PCP-Cardiologist: None  HF-Cardiologist: Dorthula Nettles, DO  HPI:  [name] is a 47 y.o. 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  According to [name] she has had sinus tachycardia for many years.  She had an echocardiogram in April 2022 with normal LV/RV function and an RVSP of 36 mmHg.  Over the past 1 year (2023-2024) she has had progressive dyspnea on exertion, lower extremity edema and bloating.  On 07/18/2023 she underwent elective hysterectomy which was complicated post procedurally by hypoxia and near syncope.  Postoperative CTA chest negative for PE.  Echocardiogram with EF of 55 to 60%, grade 2 diastolic dysfunction and moderately enlarged right ventricle with PASP of 83 mmHg with moderate to severe TR. Underwent RHC on 07/22/23 showing PA = 71/27 (41), PCW = 4, Fick cardiac output/index = 3.6/2.1, Thermo CO/CI = 3.2/1.8, PVR = 10.2 WU, PAPi = 11. She was started on sildenafil 20 mg TID and discharged.   Seen by Dr. Gasper Lloyd on 07/29/23 for post-hospital follow-up. Reported becoming mildly short of breath when performing some ADLs (I.e changing clothes at home, showering, etc). Sildenafil was increased to 40 mg TID and prescription for Opsumit 10 mg daily was sent. There were several delays from [facility] and [facility] resulting in a delay of medication delivery to the patient. Patient will received the medication on  Today [name] returns to [facility] Clinic for pharmacist medication titration. Reports feeling ***. {Reports/Denies:210917258}. {ACTIONS;DENIES/REPORTS:21021675::"Denies"} being able to complete all activities of daily living (ADLs). Is *** active throughout the day. Weight at home is *** pounds. Takes {CHL AMB AHFC Medications:210917260} ***. Appetite ***. {Does Follow/Does Not Follow:210917261} a low sodium diet.  Current PAH  Medications: -Sildenafil 40 mg TID -Opsumit 10 mg daily  Has the patient been experiencing any side effects to the medications prescribed? {yes/no:20286}  Does the patient have any problems obtaining medications due to transportation or finances? {yes/no:20286}  Understanding of regimen: {CHL AMB AHFC Excellent/Good/Fair/Poor:210917262}  Understanding of indications: {CHL AMB AHFC Excellent/Good/Fair/Poor:210917262}  Potential of adherence: {CHL AMB AHFC Excellent/Good/Fair/Poor:210917262}  Patient understands to avoid NSAIDs.  Patient understands to avoid decongestants.  Pertinent Lab Values: Creatinine  Date Value Ref Range Status  07/30/2013 0.78 0.60 - 1.30 mg/dL Final   Creatinine, Ser  Date Value Ref Range Status  07/29/2023 0.72 0.57 - 1.00 mg/dL Final   BUN  Date Value Ref Range Status  07/29/2023 9 6 - 24 mg/dL Final  24/40/1027 12 7 - 18 mg/dL Final   Potassium  Date Value Ref Range Status  07/29/2023 4.5 3.5 - 5.2 mmol/L Final  07/30/2013 3.5 3.5 - 5.1 mmol/L Final   Sodium  Date Value Ref Range Status  07/29/2023 142 134 - 144 mmol/L Final  07/30/2013 136 136 - 145 mmol/L Final   B Natriuretic Peptide  Date Value Ref Range Status  07/19/2023 470.4 (H) 0.0 - 100.0 pg/mL Final    Comment:    Performed at [facility], [address] [place], [place] [address]   BNP  Date Value Ref Range Status  07/29/2023 171.6 (H) 0.0 - 100.0 pg/mL Final    Comment:    [employer] ADVIA Centaur XP methodology   Magnesium  Date Value Ref Range Status  03/02/2021 1.7 1.7 - 2.4 mg/dL Final    Comment:    Performed at [facility], [address], [place], [place] [address]  TSH  Date Value Ref Range Status  03/01/2021 6.091 (H) 0.350 - 4.500 uIU/mL Final    Comment:    Performed by a 3rd Generation assay with a functional sensitivity of <=0.01 uIU/mL. Performed at [facility], [address] [place], [place] [address]    08/10/2008 1.31 0.35 - 5.50 microintl units/mL Final    Vital Signs: There were no vitals filed for this visit.  Assessment/Plan: Pulmonary Hypertension - Likely predominantly group 1 with some degree of group III  - ECHO  9/24 LVEF 55-60% G2DD. RV mild to moderately reduced function with septal flattening. Mod-sev TR RVSP 83 - CT negative for PE, V/Q scan with some mild heterogenity but no definitive sign of chronic PE - RA 4 PA 71/27 (41) PCW  4 Fick 3.6/2.1 Thermo CO/CI 3.2/1.8 PVR 10.2 WU PAPi 11  - CTD serologies: ESR 8, CRP 1, mildly elevated C3 complement at 193, negative anti-SCL, negative ANCA panel & negative ANA.  - V/Q scan negative.  - PFTs with DLCO pending - Reviewed CTA chest personally; does not appear to have much parenchymal lung disease. - Currently on sildenafil 20mg  TID; will increase to 40mg  TID - Opsumit 10mg  approved. Will send Rx today.  - Schedule sleep study.  - Scheduling PFTs.  - Plan for follow up in 3-4 weeks with pharmacy & Dr. Gala Romney   2. RV Failure - Dilated RV on TTE w/ moderately reduced function - Continue digoxin daily; repeat level today.    3. Hyperlipidemia - crestor 10mg    4. Obesity - Mildly overweight; however, in the setting of RVF, PH will plan to start ozempic once PH meds are started.  Follow up: ***  ***

## 2023-08-11 ENCOUNTER — Ambulatory Visit: Payer: Managed Care, Other (non HMO) | Attending: Cardiology

## 2023-08-11 DIAGNOSIS — E785 Hyperlipidemia, unspecified: Secondary | ICD-10-CM

## 2023-08-11 DIAGNOSIS — I272 Pulmonary hypertension, unspecified: Secondary | ICD-10-CM

## 2023-08-11 DIAGNOSIS — I50810 Right heart failure, unspecified: Secondary | ICD-10-CM

## 2023-08-11 NOTE — Procedures (Signed)
atient Information Study Date: 08/09/2023 Patient Name: [name] Patient ID: [id number] Birth Date: Aug 31, 1976 Age: 47 Gender: Female BMI: 30.5 (W=172 lb, H=5' 3'') Stopbang: 3 Referring Physician: Dorthula Nettles, MD  TEST DESCRIPTION: Home sleep apnea testing was completed using the WatchPat, a Type 1 device, utilizing peripheral arterial tonometry (PAT), chest movement, actigraphy, pulse oximetry, pulse rate, body position and snore. AHI was calculated with apnea and hypopnea using valid sleep time as the denominator. RDI includes apneas, hypopneas, and RERAs. The data acquired and the scoring of sleep and all associated events were performed in accordance with the recommended standards and specifications as outlined in the AASM Manual for the Scoring of Sleep and Associated Events 2.2.0 (2015).   FINDINGS:   1. Mild Obstructive Sleep Apnea with AHI 9.6/hr.   2. No Central Sleep Apnea with pAHIc 1.1/hr.   3. Oxygen desaturations as low as 85%.   4. Mild to moderate snoring was present. O2 sats were < 88% for 4.8 min.   5. Total sleep time was 6 hrs and 51 min.   6. 15% of total sleep time was spent in REM sleep.   7. Normal sleep onset latency at 19 min.   8. Prolonged REM sleep onset latency at 147 min.   9. Total awakenings were 20.  10. Arrhythmia detection:  None  DIAGNOSIS: Mild Obstructive Sleep Apnea (G47.33)  RECOMMENDATIONS:   1.  Clinical correlation of these findings is necessary.  The decision to treat obstructive sleep apnea (OSA) is usually based on the presence of apnea symptoms or the presence of associated medical conditions such as Hypertension, Congestive Heart Failure, Atrial Fibrillation or Obesity.  The most common symptoms of OSA are snoring, gasping for breath while sleeping, daytime sleepiness and fatigue.   2.  Initiating apnea therapy is recommended given the presence of symptoms and/or associated conditions. Recommend proceeding with one of the  following:     a.  Auto-CPAP therapy with a pressure range of 5-20cm H2O.     b.  An oral appliance (OA) that can be obtained from certain dentists with expertise in sleep medicine.  These are primarily of use in non-obese patients with mild and moderate disease.     c.  An ENT consultation which may be useful to look for specific causes of obstruction and possible treatment options.     d.  If patient is intolerant to PAP therapy, consider referral to ENT for evaluation for hypoglossal nerve stimulator.   3.  Close follow-up is necessary to ensure success with CPAP or oral appliance therapy for maximum benefit.  4.  A follow-up oximetry study on CPAP is recommended to assess the adequacy of therapy and determine the need for supplemental oxygen or the potential need for Bi-level therapy.  An arterial blood gas to determine the adequacy of baseline ventilation and oxygenation should also be considered.  5.  Healthy sleep recommendations include:  adequate nightly sleep (normal 7-9 hrs/night), avoidance of caffeine after noon and alcohol near bedtime, and maintaining a sleep environment that is cool, dark and quiet.  6.  Weight loss for overweight patients is recommended.  Even modest amounts of weight loss can significantly improve the severity of sleep apnea.  7.  Snoring recommendations include:  weight loss where appropriate, side sleeping, and avoidance of alcohol before bed.  8.  Operation of motor vehicle should be avoided when sleepy.  Signature: Armanda Magic, MD; [facility]; Diplomat, American Board of Sleep Medicine Electronically  Signed: 08/11/2023 7:07:05 PM

## 2023-08-14 ENCOUNTER — Encounter: Payer: Self-pay | Admitting: Oncology

## 2023-08-14 ENCOUNTER — Ambulatory Visit: Payer: Managed Care, Other (non HMO) | Attending: Cardiology | Admitting: Pharmacist

## 2023-08-14 ENCOUNTER — Other Ambulatory Visit (HOSPITAL_COMMUNITY): Payer: Self-pay

## 2023-08-14 VITALS — BP 126/78 | HR 97 | Wt 167.0 lb

## 2023-08-14 DIAGNOSIS — I272 Pulmonary hypertension, unspecified: Secondary | ICD-10-CM

## 2023-08-14 MED ORDER — DAPAGLIFLOZIN PROPANEDIOL 10 MG PO TABS: 10.0000 mg | ORAL_TABLET | Freq: Every day | ORAL | 11 refills | Status: DC

## 2023-08-14 MED ORDER — TADALAFIL (PAH) 20 MG PO TABS: 40.0000 mg | ORAL_TABLET | Freq: Every day | ORAL | 11 refills | Status: DC

## 2023-08-14 NOTE — Patient Instructions (Signed)
It was a pleasure seeing you today!  MEDICATIONS: -We are changing your medications today -Start Farxiga 10 mg once daily. This will help keep fluid off and can benefit your right heart. -Stop sildenafil and start tadalafil 40 mg (2 tablets) once daily -Call if you have questions about your medications.  LABS: -We will call you if your labs need attention.  NEXT APPOINTMENT: Return to clinic in 3 weeks with Dr. Gala Romney.  In general, to take care of your heart failure: -Limit your fluid intake to 2 Liters (half-gallon) per day.   -Limit your salt intake to ideally 2-3 grams (2000-3000 mg) per day. -Weigh yourself daily and record, and bring that "weight diary" to your next appointment.  (Weight gain of 2-3 pounds in 1 day typically means fluid weight.) -The medications for your heart are to help your heart and help you live longer.   -Please contact us before stopping any of your heart medications.  Call the clinic at [phone] with questions or to reschedule future appointments.

## 2023-08-15 ENCOUNTER — Other Ambulatory Visit (HOSPITAL_COMMUNITY): Payer: Self-pay

## 2023-08-15 ENCOUNTER — Telehealth: Payer: Self-pay

## 2023-08-15 NOTE — Telephone Encounter (Signed)
Patient Advocate Encounter  Received notification from [facility] that prior auth is required for [place]. Test billing returns $0 copay for 90 day supply.  No prior auth submitted at this time.  Burnell Blanks, CPhT Rx Patient Advocate Phone: [phone]

## 2023-08-16 ENCOUNTER — Telehealth: Payer: Self-pay | Admitting: Pharmacist

## 2023-08-19 ENCOUNTER — Other Ambulatory Visit (HOSPITAL_COMMUNITY): Payer: Self-pay

## 2023-08-19 ENCOUNTER — Encounter: Payer: Self-pay | Admitting: Oncology

## 2023-08-20 MED ORDER — DIGOXIN 125 MCG PO TABS: 0.1250 mg | ORAL_TABLET | Freq: Every day | ORAL | 1 refills | Status: DC

## 2023-08-20 MED ORDER — ROSUVASTATIN CALCIUM 10 MG PO TABS: 10.0000 mg | ORAL_TABLET | Freq: Every day | ORAL | 1 refills | Status: DC

## 2023-08-20 NOTE — Telephone Encounter (Signed)
Patient called to ask what medications she can take for sinus congestion. Recommendation for an antihistamine, Flonase, and Mucinex provided. Refills also sent for rosuvastatin and digoxin. Patient instructed to see PCP if sinus congestion worsens. This could potentially be Opsumit causing nasal congestion, so if this continues orr worsens, we may need to transition to ambrisentan.

## 2023-08-21 ENCOUNTER — Telehealth: Payer: Self-pay

## 2023-08-21 NOTE — Telephone Encounter (Signed)
Notified patient of sleep study results and recommendations. All question were answered, patient verbalized understanding. CPAP order placed through [facility] 08/21/23.

## 2023-08-21 NOTE — Telephone Encounter (Signed)
-----   Message from Armanda Magic sent at 08/11/2023  7:09 PM EDT ----- Please let patient know that they have sleep apnea and recommend treating with CPAP.  Please order an auto CPAP from 4-15cm H2O with heated humidity and mask of choice.  Order overnight pulse ox on CPAP.  Followup with me in 6 weeks.

## 2023-08-22 ENCOUNTER — Ambulatory Visit: Payer: Managed Care, Other (non HMO) | Attending: Cardiology

## 2023-08-22 DIAGNOSIS — E785 Hyperlipidemia, unspecified: Secondary | ICD-10-CM | POA: Diagnosis not present

## 2023-08-22 DIAGNOSIS — I50810 Right heart failure, unspecified: Secondary | ICD-10-CM | POA: Diagnosis not present

## 2023-08-22 DIAGNOSIS — I272 Pulmonary hypertension, unspecified: Secondary | ICD-10-CM | POA: Diagnosis present

## 2023-08-22 DIAGNOSIS — R0609 Other forms of dyspnea: Secondary | ICD-10-CM | POA: Insufficient documentation

## 2023-08-22 DIAGNOSIS — Z87891 Personal history of nicotine dependence: Secondary | ICD-10-CM | POA: Diagnosis not present

## 2023-08-22 LAB — PULMONARY FUNCTION TEST ARMC ONLY
DL/VA % pred: 75 %
DL/VA: 3.28 ml/min/mmHg/L
DLCO unc % pred: 62 %
DLCO unc: 12.81 ml/min/mmHg
FEF 25-75 Post: 1.15 L/s
FEF 25-75 Pre: 1.11 L/s
FEF2575-%Change-Post: 3 %
FEF2575-%Pred-Post: 41 %
FEF2575-%Pred-Pre: 40 %
FEV1-%Change-Post: 6 %
FEV1-%Pred-Post: 60 %
FEV1-%Pred-Pre: 57 %
FEV1-Post: 1.66 L
FEV1-Pre: 1.56 L
FEV1FVC-%Change-Post: 0 %
FEV1FVC-%Pred-Pre: 79 %
FEV6-%Change-Post: 8 %
FEV6-%Pred-Post: 76 %
FEV6-%Pred-Pre: 70 %
FEV6-Post: 2.57 L
FEV6-Pre: 2.37 L
FEV6FVC-%Pred-Post: 102 %
FEV6FVC-%Pred-Pre: 102 %
FVC-%Change-Post: 5 %
FVC-%Pred-Post: 74 %
FVC-%Pred-Pre: 70 %
FVC-Post: 2.57 L
FVC-Pre: 2.44 L
Post FEV1/FVC ratio: 64 %
Post FEV6/FVC ratio: 100 %
Pre FEV1/FVC ratio: 64 %
Pre FEV6/FVC Ratio: 100 %

## 2023-08-22 MED ORDER — ALBUTEROL SULFATE (2.5 MG/3ML) 0.083% IN NEBU
2.5000 mg | INHALATION_SOLUTION | Freq: Once | RESPIRATORY_TRACT | Status: AC
  Administered 2023-08-22: 2.5 mg via RESPIRATORY_TRACT
  Filled 2023-08-22: qty 3

## 2023-08-27 ENCOUNTER — Telehealth: Payer: Self-pay | Admitting: Pharmacist

## 2023-08-30 ENCOUNTER — Encounter: Payer: Self-pay | Admitting: Internal Medicine

## 2023-08-30 ENCOUNTER — Ambulatory Visit: Payer: Managed Care, Other (non HMO) | Attending: Internal Medicine | Admitting: Internal Medicine

## 2023-08-30 VITALS — BP 118/83 | HR 85 | Wt 164.6 lb

## 2023-08-30 DIAGNOSIS — E785 Hyperlipidemia, unspecified: Secondary | ICD-10-CM

## 2023-08-30 DIAGNOSIS — G4733 Obstructive sleep apnea (adult) (pediatric): Secondary | ICD-10-CM | POA: Diagnosis not present

## 2023-08-30 DIAGNOSIS — I272 Pulmonary hypertension, unspecified: Secondary | ICD-10-CM | POA: Diagnosis not present

## 2023-08-30 DIAGNOSIS — I50810 Right heart failure, unspecified: Secondary | ICD-10-CM

## 2023-08-30 MED ORDER — DIGOXIN 125 MCG PO TABS: 0.0625 mg | ORAL_TABLET | Freq: Every day | ORAL | 1 refills | Status: DC

## 2023-08-30 NOTE — Progress Notes (Unsigned)
ADVANCED HEART FAILURE CLINIC NOTE  Referring Physician: Larena Glassman, PA  Primary Care: Larena Glassman, PA HF: Dr. Gala Romney    HPI: [name] is a 47 y.o. 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  According to Ms. Finchum she has had sinus tachycardia for many years.  She had an echocardiogram in April 2022 with normal LV/RV function and an RVSP of 36 mmHg.  Over the past 1 year (2023-2024) she has had progressive dyspnea on exertion, lower extremity edema and bloating.  On 07/18/2023 she underwent elective hysterectomy which was complicated post procedurally by hypoxia and near syncope.  Postoperative CTA chest negative for PE.  Echocardiogram with EF of 55 to 60%, grade 2 diastolic dysfunction and moderately enlarged right ventricle with PASP of 83 mmHg with moderate to severe TR.  She was started on sildenafil and patient and presents today for follow-up.  Started Opsumit 10/5. Hasn't noticed a difference. SOB with mild exercise. Over past week or two had been treated for laryngitis with abx and inhaler. Symptoms getting better. Mild abdominal bloating. No dizziness or lightheadedness. Scared to exercise due to tachycardia.  Sleep study 10/24 AHI 9.6/hr. Now following with Dr. Mayford Knife. Pending CPAP  PFTs 10/24  FEV1  1.56 (57%) FVC 2.44 (70%) DLCO 62%   Past Medical History:  Diagnosis Date   Abnormal thyroid function test    Anemia    Anxiety    Bronchitis 06/2023   finished prednisone and currently taking Amoxicillin as of 06-12-23   Cervical lymphadenopathy    Chronic back pain    Ectopic pregnancy    Headache    MIGRAINES   History of kidney stones    HSV infection    Lumbar radiculopathy    Menorrhagia    Palpitations    Pneumonia    06/2023   PONV (postoperative nausea and vomiting)    DURING KIDNEY STONE REMOVAL   Seizures (HCC) 2014   one occurence-due to Tramadol    UTI (urinary tract infection)     Current Outpatient Medications  Medication Sig Dispense Refill   amoxicillin-clavulanate (AUGMENTIN) 875-125 MG tablet Take 1 tablet by mouth 2 (two) times daily.     dapagliflozin propanediol (FARXIGA) 10 MG TABS tablet Take 1 tablet (10 mg total) by mouth daily. 30 tablet 11   digoxin (LANOXIN) 0.125 MG tablet Take 1 tablet (0.125 mg total) by mouth daily. 30 tablet 1   macitentan (OPSUMIT) 10 MG tablet Take 1 tablet (10 mg total) by mouth daily.     rosuvastatin (CRESTOR) 10 MG tablet Take 1 tablet (10 mg total) by mouth daily. 30 tablet 1   tadalafil, PAH, (ADCIRCA) 20 MG tablet Take 2 tablets (40 mg total) by mouth daily. 60 tablet 11   No current facility-administered medications for this visit.    Allergies  Allergen Reactions   Sulfa Antibiotics Hives and Itching   Tramadol Other (See Comments)    seizures   Compazine [Prochlorperazine] Anxiety    Tachycardia       Social History   Socioeconomic History   Marital status: Divorced    Spouse name: Not on file   Number of children: Not on file   Years of education: Not on file   Highest education level: Not on file  Occupational History   Not on file  Tobacco Use   Smoking status: Former    Current packs/day: 0.00    Types:  Cigarettes    Quit date: 10/21/2019    Years since quitting: 3.8   Smokeless tobacco: Never  Vaping Use   Vaping status: Former   Quit date: 06/06/2023   Substances: Nicotine, Flavoring  Substance and Sexual Activity   Alcohol use: No   Drug use: No   Sexual activity: Yes  Other Topics Concern   Not on file  Social History Narrative   Not on file   Social Determinants of Health   Financial Resource Strain: Not on file  Food Insecurity: No Food Insecurity (07/21/2023)   Hunger Vital Sign    Worried About Running Out of Food in the Last Year: Never true    Ran Out of Food in the Last Year: Never true  Transportation Needs: No Transportation Needs  (07/21/2023)   PRAPARE - Administrator, Civil Service (Medical): No    Lack of Transportation (Non-Medical): No  Physical Activity: Not on file  Stress: Not on file  Social Connections: Not on file  Intimate Partner Violence: Not At Risk (07/21/2023)   Humiliation, Afraid, Rape, and Kick questionnaire    Fear of Current or Ex-Partner: No    Emotionally Abused: No    Physically Abused: No    Sexually Abused: No      Family History  Problem Relation Age of Onset   Cancer Maternal Grandmother     PHYSICAL EXAM: Vitals:   08/30/23 1429  BP: 118/83  Pulse: 85  SpO2: 95%   General:  Hoarse No resp difficulty HEENT: normal Neck: supple. no JVD. Carotids 2+ bilat; no bruits. No lymphadenopathy or thryomegaly appreciated. Cor: PMI nondisplaced. Regular rate & rhythm. No rubs, gallops or murmurs. Lungs: clear Abdomen: soft, nontender, nondistended. No hepatosplenomegaly. No bruits or masses. Good bowel sounds. Extremities: no cyanosis, clubbing, rash, edema Neuro: alert & orientedx3, cranial nerves grossly intact. moves all 4 extremities w/o difficulty. Affect pleasant   DATA REVIEW  ECG: 07/29/23: sinus tachycardia  as per my personal interpretation  ECHO: 07/19/23: LVEF 55-60%, Grade II DD, RV moderately enlarged with mild to moderately reduced function as per my personal interpretation  CATH: 9/16 Ao = 102/71 (85) LV = 101/6 RA = 4  RV = 70/7 PA = 71/27 (41) PCW = 4 Fick cardiac output/index = 3.6/2.1 Thermo CO/CI = 3.2/1.8 PVR = 10.2 WU FA sat = 95% PA sat = 57%, 59% PAPi = 11    Assessment: 1. Mild non-obstructive CAD  2. LVEF 55-60% 3. Moderate PAH with moderate to severely reduced CO  V/Q SCAN (07/23/23) Slight heterogeneous pulmonary perfusion but no wedge-shaped perfusion defects to suggest pulmonary embolus.    ASSESSMENT & PLAN:  Pulmonary Hypertension - Likely predominantly group 1 with some degree of group III  - ECHO  9/24 LVEF  55-60% G2DD. RV mild to moderately reduced function with septal flattening. Mod-sev TR RVSP 83 - CT negative for PE, V/Q scan with some mild heterogenity but no definitive sign of chronic PE - RA 4 PA 71/27 (41) PCW  4 Fick 3.6/2.1 Thermo CO/CI 3.2/1.8 PVR 10.2 WU PAPi 11  - CTD serologies: ESR 8, CRP 1, mildly elevated C3 complement at 193, negative anti-SCL, negative ANCA panel & negative ANA. Consider Rheum referral - V/Q scan negative.  - PFTs with DLCO pending - Reviewed CTA chest personally; does not appear to have much parenchymal lung disease. - Currently on sildenafil 20mg  TID; will increase to 40mg  TID - Opsumit 10mg   - Schedule sleep study.  -  Scheduling PFTs.   2. RV Failure - Dilated RV on TTE w/ moderately reduced function - Dig level 0.9. Will cut dig to 0.06250  3. Hyperlipidemia - crestor 10mg   4. Obesity - Mildly overweight; however, in the setting of RVF, PH will plan to start ozempic once PH meds are started.   5. Abnromal PFTs - has obstructive/restrictive pattern on PFTs - refer to Dr. Clovia Cuff  6. OSA - AHI 9.6  - starting CPAP with Dr. Mayford Knife

## 2023-08-30 NOTE — Patient Instructions (Addendum)
Medication Changes:  Decrease Digoxin to 0.0625 mg (0.5 tablet) daily.   Referrals:  You have been referred to Pulmonology.  You have been referred to Pulmonary Rehab.    Special Instructions // Education:  Do the following things EVERYDAY: Weigh yourself in the morning before breakfast. Write it down and keep it in a log. Take your medicines as prescribed Eat low salt foods--Limit salt (sodium) to 2000 mg per day.  Stay as active as you can everyday Limit all fluids for the day to less than 2 liters   Follow-Up in: follow up in 6 weeks with Dr. Gala Romney.     If you have any questions or concerns before your next appointment please send [place] a message through [place] or call our office at [phone] Monday-Friday 8 am-5 pm.   If you have an urgent need after hours on the weekend please call your Primary Cardiologist or the Advanced Heart Failure Clinic in [place] at [phone].   At the Advanced Heart Failure Clinic, you and your health needs are our priority. We have a designated team specialized in the treatment of Heart Failure. This Care Team includes your primary Heart Failure Specialized Cardiologist (physician), Advanced Practice Providers (APPs- Physician Assistants and Nurse Practitioners), and Pharmacist who all work together to provide you with the care you need, when you need it.   You may see any of the following providers on your designated Care Team at your next follow up:  Dr. Arvilla Meres Dr. Marca Ancona Dr. Dorthula Nettles Dr. Theresia Bough Tonye Becket, NP Robbie Lis, [place] [address] [place], [place] Brynda Peon, NP [place] Lee, NP Clarisa Kindred, NP Enos Fling, PharmD

## 2023-09-05 ENCOUNTER — Encounter: Payer: Managed Care, Other (non HMO) | Attending: Internal Medicine | Admitting: *Deleted

## 2023-09-05 ENCOUNTER — Encounter: Payer: Self-pay | Admitting: *Deleted

## 2023-09-05 DIAGNOSIS — I272 Pulmonary hypertension, unspecified: Secondary | ICD-10-CM

## 2023-09-05 NOTE — Progress Notes (Signed)
Virtual orientation call completed today. shehas an appointment on Date: 09/10/2023  for EP eval and gym Orientation.  Documentation of diagnosis can be found in [facility] Date: 08/30/2023 .

## 2023-09-10 ENCOUNTER — Encounter: Payer: Managed Care, Other (non HMO) | Attending: Internal Medicine

## 2023-09-10 VITALS — Ht 63.9 in | Wt 166.7 lb

## 2023-09-10 DIAGNOSIS — I272 Pulmonary hypertension, unspecified: Secondary | ICD-10-CM | POA: Diagnosis present

## 2023-09-10 DIAGNOSIS — Z5189 Encounter for other specified aftercare: Secondary | ICD-10-CM | POA: Insufficient documentation

## 2023-09-10 DIAGNOSIS — Z87891 Personal history of nicotine dependence: Secondary | ICD-10-CM | POA: Diagnosis not present

## 2023-09-10 NOTE — Progress Notes (Signed)
Pulmonary Individual Treatment Plan  Patient Details  Name: [name] MRN: [id number] Date of Birth: 21-Oct-1976 Referring Provider:   Flowsheet Row Pulmonary Rehab from 09/10/2023 in [facility] Cardiac and Pulmonary Rehab  Referring Provider Dr. Arvilla Meres       Initial Encounter Date:  Flowsheet Row Pulmonary Rehab from 09/10/2023 in [facility] Cardiac and Pulmonary Rehab  Date 09/10/23       Visit Diagnosis: Pulmonary hypertension (HCC)  Patient's Home Medications on Admission:  Current Outpatient Medications:    amoxicillin-clavulanate (AUGMENTIN) 875-125 MG tablet, Take 1 tablet by mouth 2 (two) times daily. (Patient not taking: Reported on 09/05/2023), Disp: , Rfl:    dapagliflozin propanediol (FARXIGA) 10 MG TABS tablet, Take 1 tablet (10 mg total) by mouth daily., Disp: 30 tablet, Rfl: 11   digoxin (LANOXIN) 0.125 MG tablet, Take 0.5 tablets (0.0625 mg total) by mouth daily., Disp: 30 tablet, Rfl: 1   macitentan (OPSUMIT) 10 MG tablet, Take 1 tablet (10 mg total) by mouth daily., Disp: , Rfl:    rosuvastatin (CRESTOR) 10 MG tablet, Take 1 tablet (10 mg total) by mouth daily., Disp: 30 tablet, Rfl: 1   tadalafil, PAH, (ADCIRCA) 20 MG tablet, Take 2 tablets (40 mg total) by mouth daily., Disp: 60 tablet, Rfl: 11  Past Medical History: Past Medical History:  Diagnosis Date   Abnormal thyroid function test    Anemia    Anxiety    Bronchitis 06/2023   finished prednisone and currently taking Amoxicillin as of 06-12-23   Cervical lymphadenopathy    Chronic back pain    Ectopic pregnancy    Headache    MIGRAINES   History of kidney stones    HSV infection    Lumbar radiculopathy    Menorrhagia    Palpitations    Pneumonia    06/2023   PONV (postoperative nausea and vomiting)    DURING KIDNEY STONE REMOVAL   Seizures (HCC) 2014   one occurence-due to Tramadol   UTI (urinary tract infection)     Tobacco Use: Social History   Tobacco Use  Smoking Status Former    Current packs/day: 0.00   Types: Cigarettes   Quit date: 10/21/2019   Years since quitting: 3.8  Smokeless Tobacco Never    Labs: Review Flowsheet  More data exists      Latest Ref Rng & Units 04/20/2009 03/01/2021 07/18/2023 07/19/2023 07/22/2023  Labs for ITP Cardiac and Pulmonary Rehab  Cholestrol 0 - 200 mg/dL - - 431  - -  LDL (calc) 0 - 99 mg/dL - - 540  - -  HDL-C >08 mg/dL - - 41  - -  Trlycerides <150 mg/dL - - 79  - -  Hemoglobin A1c 4.8 - 5.6 % - 5.8  - 5.7  -  PH, Arterial 7.35 - 7.45 - - - - 7.459   PCO2 arterial 32 - 48 mmHg - - - - 32.1   Bicarbonate 20.0 - 28.0 mmol/L - - 21.2  - 28.6  28.5  22.7   TCO2 22 - 32 mmol/L 21  - - - 30  30  24    Acid-base deficit 0.0 - 2.0 mmol/L - - 3.1  - -  O2 Saturation % - - 75.1  - 59  57  95     Details       Multiple values from one day are sorted in reverse-chronological order          Pulmonary Assessment Scores:  Pulmonary  Assessment Scores     Row Name 09/10/23 1507         mMRC Score   mMRC Score 1              UCSD: Self-administered rating of dyspnea associated with activities of daily living (ADLs) 6-point scale (0 = "not at all" to 5 = "maximal or unable to do because of breathlessness")  Scoring Scores range from 0 to 120.  Minimally important difference is 5 units  CAT: CAT can identify the health impairment of COPD patients and is better correlated with disease progression.  CAT has a scoring range of zero to 40. The CAT score is classified into four groups of low (less than 10), medium (10 - 20), high (21-30) and very high (31-40) based on the impact level of disease on health status. A CAT score over 10 suggests significant symptoms.  A worsening CAT score could be explained by an exacerbation, poor medication adherence, poor inhaler technique, or progression of COPD or comorbid conditions.  CAT MCID is 2 points  mMRC: mMRC (Modified Medical Research Council) Dyspnea Scale is used to assess the  degree of baseline functional disability in patients of respiratory disease due to dyspnea. No minimal important difference is established. A decrease in score of 1 point or greater is considered a positive change.   Pulmonary Function Assessment:   Exercise Target Goals: Exercise Program Goal: Individual exercise prescription set using results from initial 6 min walk test and THRR while considering  patient's activity barriers and safety.   Exercise Prescription Goal: Initial exercise prescription builds to 30-45 minutes a day of aerobic activity, 2-3 days per week.  Home exercise guidelines will be given to patient during program as part of exercise prescription that the participant will acknowledge.  Education: Aerobic Exercise: - Group verbal and visual presentation on the components of exercise prescription. Introduces F.I.T.T principle from ACSM for exercise prescriptions.  Reviews F.I.T.T. principles of aerobic exercise including progression. Written material given at graduation.   Education: Resistance Exercise: - Group verbal and visual presentation on the components of exercise prescription. Introduces F.I.T.T principle from ACSM for exercise prescriptions  Reviews F.I.T.T. principles of resistance exercise including progression. Written material given at graduation.    Education: Exercise & Equipment Safety: - Individual verbal instruction and demonstration of equipment use and safety with use of the equipment. Flowsheet Row Pulmonary Rehab from 09/10/2023 in [facility] Cardiac and Pulmonary Rehab  Date 09/10/23  Educator [facility]  Instruction Review Code 1- Verbalizes Understanding       Education: Exercise Physiology & General Exercise Guidelines: - Group verbal and written instruction with models to review the exercise physiology of the cardiovascular system and associated critical values. Provides general exercise guidelines with specific guidelines to those with heart or lung  disease.    Education: Flexibility, Balance, Mind/Body Relaxation: - Group verbal and visual presentation with interactive activity on the components of exercise prescription. Introduces F.I.T.T principle from ACSM for exercise prescriptions. Reviews F.I.T.T. principles of flexibility and balance exercise training including progression. Also discusses the mind body connection.  Reviews various relaxation techniques to help reduce and manage stress (i.e. Deep breathing, progressive muscle relaxation, and visualization). Balance handout provided to take home. Written material given at graduation.   Activity Barriers & Risk Stratification:  Activity Barriers & Cardiac Risk Stratification - 09/10/23 1456       Activity Barriers & Cardiac Risk Stratification   Activity Barriers None  6 Minute Walk:  6 Minute Walk     Row Name 09/10/23 1453         6 Minute Walk   Phase Initial     Distance 1520 feet     Walk Time 6 minutes     MPH 2.9     METS 4.6     RPE 10     Perceived Dyspnea  1     VO2 Peak 16     Symptoms No     Resting HR 86 bpm     Resting BP 124/70     Resting Oxygen Saturation  95 %     Exercise Oxygen Saturation  during 6 min walk 89 %     Max Ex. HR 123 bpm     Max Ex. BP 134/70     2 Minute Post BP 118/70       Interval HR   1 Minute HR 112     2 Minute HR 118     3 Minute HR 118     4 Minute HR 122     5 Minute HR 123     6 Minute HR 103     2 Minute Post HR 89     Interval Heart Rate? Yes       Interval Oxygen   Interval Oxygen? Yes     Baseline Oxygen Saturation % 95 %     1 Minute Oxygen Saturation % 89 %     1 Minute Liters of Oxygen 0 L     2 Minute Oxygen Saturation % 90 %     2 Minute Liters of Oxygen 0 L     3 Minute Oxygen Saturation % 90 %     3 Minute Liters of Oxygen 0 L     4 Minute Oxygen Saturation % 89 %     4 Minute Liters of Oxygen 0 L     5 Minute Oxygen Saturation % 89 %     5 Minute Liters of Oxygen 0 L     6  Minute Oxygen Saturation % 93 %     6 Minute Liters of Oxygen 0 L     2 Minute Post Oxygen Saturation % 94 %     2 Minute Post Liters of Oxygen 0 L             Oxygen Initial Assessment:  Oxygen Initial Assessment - 09/10/23 1501       Home Oxygen   Home Oxygen Device None    Sleep Oxygen Prescription None    Home Exercise Oxygen Prescription None    Home Resting Oxygen Prescription None    Compliance with Home Oxygen Use Yes      Initial 6 min Walk   Oxygen Used None      Program Oxygen Prescription   Program Oxygen Prescription None      Intervention   Short Term Goals To learn and demonstrate proper pursed lip breathing techniques or other breathing techniques.     Long  Term Goals Exhibits proper breathing techniques, such as pursed lip breathing or other method taught during program session             Oxygen Re-Evaluation:   Oxygen Discharge (Final Oxygen Re-Evaluation):   Initial Exercise Prescription:  Initial Exercise Prescription - 09/10/23 1400       Date of Initial Exercise RX and Referring Provider   Date 09/10/23    Referring Provider  Dr. Arvilla Meres      Oxygen   Maintain Oxygen Saturation 88% or higher      Treadmill   MPH 2.7    Grade 1    Minutes 15    METs 3.44      Elliptical   Level 1    Speed 3    Minutes 15    METs 4.6      Prescription Details   Duration Progress to 30 minutes of continuous aerobic without signs/symptoms of physical distress      Intensity   THRR 40-80% of Max Heartrate 120-154    Ratings of Perceived Exertion 11-13    Perceived Dyspnea 0-4      Progression   Progression Continue to progress workloads to maintain intensity without signs/symptoms of physical distress.      Resistance Training   Training Prescription Yes    Weight 4    Reps 10-15             Perform Capillary Blood Glucose checks as needed.  Exercise Prescription Changes:   Exercise Prescription Changes     Row  Name 09/10/23 1400             Response to Exercise   Blood Pressure (Admit) 124/70       Blood Pressure (Exercise) 134/70       Blood Pressure (Exit) 118/70       Heart Rate (Admit) 86 bpm       Heart Rate (Exercise) 123 bpm       Heart Rate (Exit) 89 bpm       Oxygen Saturation (Admit) 95 %       Oxygen Saturation (Exercise) 89 %       Oxygen Saturation (Exit) 94 %       Rating of Perceived Exertion (Exercise) 10       Perceived Dyspnea (Exercise) 1       Symptoms none       Comments results       Duration Progress to 30 minutes of  aerobic without signs/symptoms of physical distress       Intensity THRR New         Progression   Progression Continue to progress workloads to maintain intensity without signs/symptoms of physical distress.       Average METs 4.6                Exercise Comments:   Exercise Goals and Review:   Exercise Goals     Row Name 09/10/23 1500             Exercise Goals   Increase Physical Activity Yes       Intervention Develop an individualized exercise prescription for aerobic and resistive training based on initial evaluation findings, risk stratification, comorbidities and participant's personal goals.;Provide advice, education, support and counseling about physical activity/exercise needs.       Expected Outcomes Long Term: Exercising regularly at least 3-5 days a week.;Long Term: Add in home exercise to make exercise part of routine and to increase amount of physical activity.;Short Term: Attend rehab on a regular basis to increase amount of physical activity.       Increase Strength and Stamina Yes       Intervention Provide advice, education, support and counseling about physical activity/exercise needs.;Develop an individualized exercise prescription for aerobic and resistive training based on initial evaluation findings, risk stratification, comorbidities and participant's personal goals.  Expected Outcomes Long Term:  Improve cardiorespiratory fitness, muscular endurance and strength as measured by increased METs and functional capacity ( );Short Term: Perform resistance training exercises routinely during rehab and add in resistance training at home;Short Term: Increase workloads from initial exercise prescription for resistance, speed, and METs.       Able to understand and use rate of perceived exertion (RPE) scale Yes       Intervention Provide education and explanation on how to use RPE scale       Expected Outcomes Long Term:  Able to use RPE to guide intensity level when exercising independently;Short Term: Able to use RPE daily in rehab to express subjective intensity level       Able to understand and use Dyspnea scale Yes       Intervention Provide education and explanation on how to use Dyspnea scale       Expected Outcomes Long Term: Able to use Dyspnea scale to guide intensity level when exercising independently;Short Term: Able to use Dyspnea scale daily in rehab to express subjective sense of shortness of breath during exertion       Knowledge and understanding of Target Heart Rate Range (THRR) Yes       Intervention Provide education and explanation of THRR including how the numbers were predicted and where they are located for reference       Expected Outcomes Long Term: Able to use THRR to govern intensity when exercising independently;Short Term: Able to use daily as guideline for intensity in rehab;Short Term: Able to state/look up THRR       Able to check pulse independently Yes       Intervention Review the importance of being able to check your own pulse for safety during independent exercise;Provide education and demonstration on how to check pulse in carotid and radial arteries.       Expected Outcomes Long Term: Able to check pulse independently and accurately;Short Term: Able to explain why pulse checking is important during independent exercise       Understanding of Exercise  Prescription Yes       Intervention Provide education, explanation, and written materials on patient's individual exercise prescription       Expected Outcomes Long Term: Able to explain home exercise prescription to exercise independently;Short Term: Able to explain program exercise prescription                Exercise Goals Re-Evaluation :   Discharge Exercise Prescription (Final Exercise Prescription Changes):  Exercise Prescription Changes - 09/10/23 1400       Response to Exercise   Blood Pressure (Admit) 124/70    Blood Pressure (Exercise) 134/70    Blood Pressure (Exit) 118/70    Heart Rate (Admit) 86 bpm    Heart Rate (Exercise) 123 bpm    Heart Rate (Exit) 89 bpm    Oxygen Saturation (Admit) 95 %    Oxygen Saturation (Exercise) 89 %    Oxygen Saturation (Exit) 94 %    Rating of Perceived Exertion (Exercise) 10    Perceived Dyspnea (Exercise) 1    Symptoms none    Comments results    Duration Progress to 30 minutes of  aerobic without signs/symptoms of physical distress    Intensity THRR New      Progression   Progression Continue to progress workloads to maintain intensity without signs/symptoms of physical distress.    Average METs 4.6  Nutrition:  Target Goals: Understanding of nutrition guidelines, daily intake of sodium 1500mg , cholesterol 200mg , calories 30% from fat and 7% or less from saturated fats, daily to have 5 or more servings of fruits and vegetables.  Education: All About Nutrition: -Group instruction provided by verbal, written material, interactive activities, discussions, models, and posters to present general guidelines for heart healthy nutrition including fat, fiber, MyPlate, the role of sodium in heart healthy nutrition, utilization of the nutrition label, and utilization of this knowledge for meal planning. Follow up email sent as well. Written material given at graduation.   Biometrics:  Pre Biometrics - 09/10/23  1459       Pre Biometrics   Height 5' 3.9" (1.623 m)    Weight 166 lb 11.2 oz (75.6 kg)    Waist Circumference 37.5 inches    Hip Circumference 42.5 inches    Waist to Hip Ratio 0.88 %    BMI (Calculated) 28.71    Single Leg Stand 9.53 seconds              Nutrition Therapy Plan and Nutrition Goals:   Nutrition Assessments:  MEDIFICTS Score Key: >=70 Need to make dietary changes  40-70 Heart Healthy Diet <= 40 Therapeutic Level Cholesterol Diet   Picture Your Plate Scores: <84 Unhealthy dietary pattern with much room for improvement. 41-50 Dietary pattern unlikely to meet recommendations for good health and room for improvement. 51-60 More healthful dietary pattern, with some room for improvement.  >60 Healthy dietary pattern, although there may be some specific behaviors that could be improved.   Nutrition Goals Re-Evaluation:   Nutrition Goals Discharge (Final Nutrition Goals Re-Evaluation):   Psychosocial: Target Goals: Acknowledge presence or absence of significant depression and/or stress, maximize coping skills, provide positive support system. Participant is able to verbalize types and ability to use techniques and skills needed for reducing stress and depression.   Education: Stress, Anxiety, and Depression - Group verbal and visual presentation to define topics covered.  Reviews how body is impacted by stress, anxiety, and depression.  Also discusses healthy ways to reduce stress and to treat/manage anxiety and depression.  Written material given at graduation.   Education: Sleep Hygiene -Provides group verbal and written instruction about how sleep can affect your health.  Define sleep hygiene, discuss sleep cycles and impact of sleep habits. Review good sleep hygiene tips.    Initial Review & Psychosocial Screening:  Initial Psych Review & Screening - 09/05/23 1012       Family Dynamics   Good Support System? Yes   Mom and Dad,     Barriers    Psychosocial barriers to participate in program There are no identifiable barriers or psychosocial needs.      Screening Interventions   Interventions To provide support and resources with identified psychosocial needs;Provide feedback about the scores to participant;Encouraged to exercise    Expected Outcomes Short Term goal: Utilizing psychosocial counselor, staff and physician to assist with identification of specific Stressors or current issues interfering with healing process. Setting desired goal for each stressor or current issue identified.;Long Term Goal: Stressors or current issues are controlled or eliminated.;Short Term goal: Identification and review with participant of any Quality of Life or Depression concerns found by scoring the questionnaire.;Long Term goal: The participant improves quality of Life and PHQ9 Scores as seen by post scores and/or verbalization of changes             Quality of Life Scores:  Scores of 19  and below usually indicate a poorer quality of life in these areas.  A difference of  2-3 points is a clinically meaningful difference.  A difference of 2-3 points in the total score of the Quality of Life Index has been associated with significant improvement in overall quality of life, self-image, physical symptoms, and general health in studies assessing change in quality of life.  PHQ-9: Review Flowsheet       09/10/2023  Depression screen PHQ 2/9  Decreased Interest 0  Down, Depressed, Hopeless 0  PHQ - 2 Score 0  Altered sleeping 1  Tired, decreased energy 1  Change in appetite 0  Feeling bad or failure about yourself  0  Trouble concentrating 0  Moving slowly or fidgety/restless 0  Suicidal thoughts 0  PHQ-9 Score 2  Difficult doing work/chores Not difficult at all    Details           Interpretation of Total Score  Total Score Depression Severity:  1-4 = Minimal depression, 5-9 = Mild depression, 10-14 = Moderate depression, 15-19 =  Moderately severe depression, 20-27 = Severe depression   Psychosocial Evaluation and Intervention:  Psychosocial Evaluation - 09/05/23 1026       Psychosocial Evaluation & Interventions   Interventions Encouraged to exercise with the program and follow exercise prescription    Comments [name] has no barriers to attending the program.  She did have anxiety about exercise after her HR went up to 155 when she got up to move in the hospital. TAlking with her physician ,she feels less anxiety being sent to the PR program and being in a clinical place to exercise. She has a new diagnosis of PUlmonary hypertension and is working with her doctors to do all they can to keep it controlled, and to understand how to manage with the disease.  She has support from her Mom and Dad. She lives with her 2 children.  She is ready to get started with the program.    Expected Outcomes STG attends all scheduled sessions, continues to work on reducing any anxiety about exercising with Pul HTN. LTG  Continues exercise progression after discharge, continue to work with her physician for any anxiety control    Continue Psychosocial Services  Follow up required by staff             Psychosocial Re-Evaluation:   Psychosocial Discharge (Final Psychosocial Re-Evaluation):   Education: Education Goals: Education classes will be provided on a weekly basis, covering required topics. Participant will state understanding/return demonstration of topics presented.  Learning Barriers/Preferences:  Learning Barriers/Preferences - 09/05/23 1013       Learning Barriers/Preferences   Learning Barriers None    Learning Preferences None             General Pulmonary Education Topics:  Infection Prevention: - Provides verbal and written material to individual with discussion of infection control including proper hand washing and proper equipment cleaning during exercise session. Flowsheet Row Pulmonary Rehab from  09/10/2023 in [facility] Cardiac and Pulmonary Rehab  Date 09/10/23  Educator [facility]  Instruction Review Code 1- Verbalizes Understanding       Falls Prevention: - Provides verbal and written material to individual with discussion of falls prevention and safety. Flowsheet Row Pulmonary Rehab from 09/10/2023 in [facility] Cardiac and Pulmonary Rehab  Date 09/10/23  Educator [facility]  Instruction Review Code 1- Verbalizes Understanding       Chronic Lung Disease Review: - Group verbal instruction with posters, models, PowerPoint  presentations and videos,  to review new updates, new respiratory medications, new advancements in procedures and treatments. Providing information on websites and "800" numbers for continued self-education. Includes information about supplement oxygen, available portable oxygen systems, continuous and intermittent flow rates, oxygen safety, concentrators, and Medicare reimbursement for oxygen. Explanation of Pulmonary Drugs, including class, frequency, complications, importance of spacers, rinsing mouth after steroid MDI's, and proper cleaning methods for nebulizers. Review of basic lung anatomy and physiology related to function, structure, and complications of lung disease. Review of risk factors. Discussion about methods for diagnosing sleep apnea and types of masks and machines for OSA. Includes a review of the use of types of environmental controls: home humidity, furnaces, filters, dust mite/pet prevention, HEPA vacuums. Discussion about weather changes, air quality and the benefits of nasal washing. Instruction on Warning signs, infection symptoms, calling MD promptly, preventive modes, and value of vaccinations. Review of effective airway clearance, coughing and/or vibration techniques. Emphasizing that all should Create an Action Plan. Written material given at graduation.   AED/CPR: - Group verbal and written instruction with the use of models to demonstrate the basic use of the AED  with the basic ABC's of resuscitation.    Anatomy and Cardiac Procedures: - Group verbal and visual presentation and models provide information about basic cardiac anatomy and function. Reviews the testing methods done to diagnose heart disease and the outcomes of the test results. Describes the treatment choices: Medical Management, Angioplasty, or Coronary Bypass Surgery for treating various heart conditions including Myocardial Infarction, Angina, Valve Disease, and Cardiac Arrhythmias.  Written material given at graduation.   Medication Safety: - Group verbal and visual instruction to review commonly prescribed medications for heart and lung disease. Reviews the medication, class of the drug, and side effects. Includes the steps to properly store meds and maintain the prescription regimen.  Written material given at graduation.   Other: -Provides group and verbal instruction on various topics (see comments)   Knowledge Questionnaire Score:    Core Components/Risk Factors/Patient Goals at Admission:  Personal Goals and Risk Factors at Admission - 09/05/23 1016       Core Components/Risk Factors/Patient Goals on Admission    Weight Management Yes    Intervention Weight Management: Develop a combined nutrition and exercise program designed to reach desired caloric intake, while maintaining appropriate intake of nutrient and fiber, sodium and fats, and appropriate energy expenditure required for the weight goal.;Weight Management: Provide education and appropriate resources to help participant work on and attain dietary goals.    Admit Weight 164 lb (74.4 kg)   has lost 7 lbs   Goal Weight: Short Term 163 lb (73.9 kg)    Goal Weight: Long Term 135 lb (61.2 kg)    Expected Outcomes Short Term: Continue to assess and modify interventions until short term weight is achieved;Long Term: Adherence to nutrition and physical activity/exercise program aimed toward attainment of established weight  goal;Weight Loss: Understanding of general recommendations for a balanced deficit meal plan, which promotes 1-2 lb weight loss per week and includes a negative energy balance of [phone] kcal/d    Improve shortness of breath with ADL's Yes    Intervention Provide education, individualized exercise plan and daily activity instruction to help decrease symptoms of SOB with activities of daily living.    Expected Outcomes Short Term: Improve cardiorespiratory fitness to achieve a reduction of symptoms when performing ADLs;Long Term: Be able to perform more ADLs without symptoms or delay the onset of symptoms  Heart Failure Yes    Intervention Provide a combined exercise and nutrition program that is supplemented with education, support and counseling about heart failure. Directed toward relieving symptoms such as shortness of breath, decreased exercise tolerance, and extremity edema.    Expected Outcomes Improve functional capacity of life;Short term: Attendance in program 2-3 days a week with increased exercise capacity. Reported lower sodium intake. Reported increased fruit and vegetable intake. Reports medication compliance.;Short term: Daily weights obtained and reported for increase. Utilizing diuretic protocols set by physician.;Long term: Adoption of self-care skills and reduction of barriers for early signs and symptoms recognition and intervention leading to self-care maintenance.    Lipids Yes    Intervention Provide education and support for participant on nutrition & aerobic/resistive exercise along with prescribed medications to achieve LDL 70mg , HDL >40mg .    Expected Outcomes Short Term: Participant states understanding of desired cholesterol values and is compliant with medications prescribed. Participant is following exercise prescription and nutrition guidelines.;Long Term: Cholesterol controlled with medications as prescribed, with individualized exercise RX and with personalized nutrition  plan. Value goals: LDL < 70mg , HDL > 40 mg.             Education:Diabetes - Individual verbal and written instruction to review signs/symptoms of diabetes, desired ranges of glucose level fasting, after meals and with exercise. Acknowledge that pre and post exercise glucose checks will be done for 3 sessions at entry of program.   Know Your Numbers and Heart Failure: - Group verbal and visual instruction to discuss disease risk factors for cardiac and pulmonary disease and treatment options.  Reviews associated critical values for Overweight/Obesity, Hypertension, Cholesterol, and Diabetes.  Discusses basics of heart failure: signs/symptoms and treatments.  Introduces Heart Failure Zone chart for action plan for heart failure.  Written material given at graduation.   Core Components/Risk Factors/Patient Goals Review:    Core Components/Risk Factors/Patient Goals at Discharge (Final Review):    ITP Comments:  ITP Comments     Row Name 09/05/23 1033 09/10/23 1453         ITP Comments Virtual orientation call completed today. shehas an appointment on Date: 09/10/2023  for EP eval and gym Orientation.  Documentation of diagnosis can be found in [facility] Date: 08/30/2023 . Completed and gym orientation. Initial ITP created and sent for review to Dr. Jinny Sanders, Medical Director.               Comments: Initial ITP

## 2023-09-12 ENCOUNTER — Ambulatory Visit: Payer: Managed Care, Other (non HMO) | Admitting: Pulmonary Disease

## 2023-09-12 ENCOUNTER — Encounter: Payer: Managed Care, Other (non HMO) | Admitting: *Deleted

## 2023-09-12 ENCOUNTER — Encounter: Payer: Self-pay | Admitting: Pulmonary Disease

## 2023-09-12 ENCOUNTER — Ambulatory Visit: Payer: Managed Care, Other (non HMO)

## 2023-09-12 VITALS — BP 92/60 | HR 87 | Temp 97.6°F | Ht 63.5 in | Wt 168.2 lb

## 2023-09-12 DIAGNOSIS — J449 Chronic obstructive pulmonary disease, unspecified: Secondary | ICD-10-CM

## 2023-09-12 DIAGNOSIS — I272 Pulmonary hypertension, unspecified: Secondary | ICD-10-CM

## 2023-09-12 DIAGNOSIS — Z5189 Encounter for other specified aftercare: Secondary | ICD-10-CM | POA: Diagnosis not present

## 2023-09-12 MED ORDER — TRELEGY ELLIPTA 200-62.5-25 MCG/ACT IN AEPB
1.0000 | INHALATION_SPRAY | Freq: Every day | RESPIRATORY_TRACT | 3 refills | Status: DC
Start: 2023-09-12 — End: 2023-10-23

## 2023-09-12 NOTE — Progress Notes (Signed)
Synopsis: Referred in by [name], Carla Buckles, MD   Subjective:   PATIENT ID: [name] GENDER: female DOB: Jan 21, 1976, MRN: [id number]  Chief Complaint  Patient presents with   Consult    Occasional wheezing. Shortness of breath on exertion. No cough.    HPI [name] is a pleasant 47 year old female patient with no significant past medical history presenting today to the pulmonary clinic to establish care regarding her pulmonary hypertension and obstructive lung disease seen on PFTs.  She underwent hysterectomy on 07/18/2023 and complicated by postop hypoxic respiratory failure and near syncope.  She subsequently underwent an echocardiogram that showed an EF of 55 to 60% with grade 2 diastolic dysfunction and moderately enlarged right ventricle with PASP of 83 mmHg and moderate to severe TR.  Her CTA chest did not show any pulmonary emboli.  Her BNP at the time was 470.  She was diuresed gently and started and discharged to follow-up with cardiology.  In the interim she underwent right heart cath (09/16)  RA 4 PA 71/27 (41) PCW 4 Fick 3.6/2.1 TD 3.2/1.8 PVR10.2 TPG 37 DPG 23 .   VQ scan 09/17 with slight heterogenous pulmonary perfusion but no wedge-shaped perfusion defects to suggest PE.   Sleep study with mild OSA AHI 9.6 unable to tolerated CPAP.   ANA negative, ANCA negative, anti-SCL 70 negative, C3 elevated at 193, C4 within normal range, rheumatoid factor negative, ESR 8 and CRP 1.0 borderline.  Anti-CCP 6.  HIV negative on 09/12.  CTA chest 09/12 did not show any signs of interstitial lung disease.  PFTs 10/17 FEV1 decreased at 57% of predicted 1.56 L, FVC 2.44 L 70% of predicted with FEV1 FVC ratio 64. No significant response to bronchodilators. DLCO mildly reduced.   She is currently on tadalafil 40 mg by mouth daily (transitioned from Sildenafil 08/14/2023 - Sildenafil 40mg  TID 09/16 - 10/09).  Macitentan 10 mg 1 tablet daily (09/30).  Digoxin 0.0625 mg 1 tab daily and  dapagliflozin 1 tablet by mouth daily.  Repeat BNP 171.7 09/23   6 min walk test 11/05 - 1520 feet with O2 sat nadir 89% on room air.  Family history - Denies any family history of pulmonary disease   Social history - Ex smoker quit in 2020 smoked 1/2 PPD for 25 years, was vaping until July 2024.   ROS All systems were reviewed and are negative except for the above.  Objective:   Vitals:   09/12/23 1447  BP: 92/60  Pulse: 87  Temp: 97.6 F (36.4 C)  TempSrc: Temporal  SpO2: 98%  Weight: 168 lb 3.2 oz (76.3 kg)  Height: 5' 3.5" (1.613 m)   98% on RA BMI Readings from Last 3 Encounters:  09/12/23 29.33 kg/m  09/10/23 28.70 kg/m  08/30/23 29.16 kg/m   Wt Readings from Last 3 Encounters:  09/12/23 168 lb 3.2 oz (76.3 kg)  09/10/23 166 lb 11.2 oz (75.6 kg)  08/30/23 164 lb 9.6 oz (74.7 kg)    Physical Exam GEN: NAD, Healthy Appearing HEENT: Supple Neck, Reactive Pupils, EOMI  CVS: Normal S1, Normal S2, RRR, No murmurs or ES appreciated  Lungs: Clear bilateral air entry.  Abdomen: Soft, non tender, non distended, + BS  Extremities: Warm and well perfused, No edema  Skin: No suspicious lesions appreciated  Psych: Normal Affect  Labs and imaging were reviewed.  Ancillary Information   CBC    Component Value Date/Time   WBC 9.6 07/23/2023 0539   RBC  4.46 07/23/2023 0539   HGB 13.6 07/23/2023 0539   HGB 12.9 07/30/2013 2034   HCT 40.7 07/23/2023 0539   HCT 38.3 07/30/2013 2034   PLT 326 07/23/2023 0539   PLT 443 (H) 07/30/2013 2034   MCV 91.3 07/23/2023 0539   MCV 91 07/30/2013 2034   MCH 30.5 07/23/2023 0539   MCHC 33.4 07/23/2023 0539   RDW 13.8 07/23/2023 0539   RDW 12.9 07/30/2013 2034   LYMPHSABS 3.4 07/23/2023 0539   MONOABS 0.9 07/23/2023 0539   EOSABS 0.5 07/23/2023 0539   BASOSABS 0.1 07/23/2023 0539       Latest Ref Rng & Units 08/22/2023   10:23 AM  PFT Results  FVC-Pre L 2.44   FVC-Predicted Pre % 70   FVC-Post L 2.57    FVC-Predicted Post % 74   Pre FEV1/FVC % % 64   Post FEV1/FCV % % 64   FEV1-Pre L 1.56   FEV1-Predicted Pre % 57   FEV1-Post L 1.66   DLCO uncorrected ml/min/mmHg 12.81   DLCO UNC% % 62   DLVA Predicted % 75      Assessment & Plan:  [name] is a pleasant 47 year old female patient with no significant past medical history presenting today to the pulmonary clinic to establish care regarding her pulmonary hypertension and obstructive lung disease seen on PFTs.  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 WHO gp II to III - Intermediate risk per ERS. #C/b RV Systolic dysfunction (Followed by Cardiology Dr. Clarise Cruz)   Autoimmune panel has been negative so far per above. Will add myomarker panel. HIV negative. Mild OSA with AHI 9.6. VQ scan negative. PFTs with obstructive lung disease and moderately reduced DLCO. CT chest without parenchymal lung disease.     Appears majorly to be Group 1 pulmonary arterial hypertension with some component of group 3, gp 5 not compeletly ruled out. PA 71/27 (41) PCW 4 PVR 10.2 complicated by RV failure seen on Echocardiogram 09/13 with moderately reduced RV systolic function and moderately enlarged RV with CI 1.8 TD. Probnp 09/13 470 coming down nicely 170 09/23. Above all patient is feeling better.   []  Myomarker panel 3  []  Vitamin D 1.25 and ACE  []  Currently on Tadalafil 40 mg PO daily and Macitentan 10mg  PO daily  []  Will discuss with Dr. Gala Romney regarding adding Selexipag to her regimen []  On Digoxin 0.0625mg  daily and [place] 10mg  daily.  []  Pending repeat BNP and Echocardiogram   #Obstructive lung disease  FEV-1 at 1.57L 57% of predicted with a reduced FEV-1/FVC ratio consistent with an obstructive defect no significant response to bronchodilators. Moderately reduce DLCO.  Picture consistent with COPD/Asthma overlap however she is too young to have that degree of obstruction and  does not have any significant symptoms consistent with COPD. One would think of Sarcoidosis causing this type of picture however no clear evidence on CT chest.  EOS 500   []  Start Fluticasone-Vilanterol-Umecledinium [Trelegy] 200 1 puff daily.  []  Repeat PFTs in 3 months  []  Repeat CT chest wo Contrast in 3 months   #OSA  Mild with AHI 9.6. Unable to tolerate CPAP. Advised to discuss oral appliance with Dr. Mayford Knife.  Return in about 4 weeks (around 10/10/2023).  I spent 60 minutes caring for this patient today, including preparing to see the patient, obtaining a medical history , reviewing a separately obtained history, performing a medically appropriate examination and/or  evaluation, counseling and educating the patient/family/caregiver, ordering medications, tests, or procedures, referring and communicating with other health care professionals (not separately reported), documenting clinical information in the electronic health record, and independently interpreting results (not separately reported/billed) and communicating results to the patient/family/caregiver  Janann Colonel, MD [facility] 09/12/2023 7:00 PM

## 2023-09-12 NOTE — Progress Notes (Signed)
Daily Session Note  Patient Details  Name: [name] MRN: [id number] Date of Birth: 1975/12/24 Referring Provider:   Flowsheet Row Pulmonary Rehab from 09/10/2023 in [facility] Cardiac and Pulmonary Rehab  Referring Provider Dr. Arvilla Meres       Encounter Date: 09/12/2023  Check In:  Session Check In - 09/12/23 1107       Check-In   Supervising physician immediately available to respond to emergencies See telemetry face sheet for immediately available ER MD    Location [facility]-Cardiac & Pulmonary Rehab    Staff Present Ronette Deter, BS, Exercise Physiologist;Susanne Bice, RN, BSN, CCRP;Meredith [place], RN BSN;Joseph Woodland, Guinevere Ferrari, RN, [place]    Virtual Visit No    Medication changes reported     No    Fall or balance concerns reported    No    Warm-up and Cool-down Performed on first and last piece of equipment    Resistance Training Performed Yes    VAD Patient? No    PAD/SET Patient? No      Pain Assessment   Currently in Pain? No/denies                Social History   Tobacco Use  Smoking Status Former   Current packs/day: 0.00   Types: Cigarettes   Quit date: 10/21/2019   Years since quitting: 3.8  Smokeless Tobacco Never    Goals Met:  Independence with exercise equipment Exercise tolerated well No report of concerns or symptoms today Strength training completed today  Goals Unmet:  Not Applicable  Comments: First full day of exercise!  Patient was oriented to gym and equipment including functions, settings, policies, and procedures.  Patient's individual exercise prescription and treatment plan were reviewed.  All starting workloads were established based on the results of the 6 minute walk test done at initial orientation visit.  The plan for exercise progression was also introduced and progression will be customized based on patient's performance and goals.    Dr. Bethann Punches is Medical Director for [facility] Cardiac Rehabilitation.   Dr. Vida Rigger is Medical Director for [facility] Pulmonary Rehabilitation.

## 2023-09-17 DIAGNOSIS — I272 Pulmonary hypertension, unspecified: Secondary | ICD-10-CM

## 2023-09-17 DIAGNOSIS — Z5189 Encounter for other specified aftercare: Secondary | ICD-10-CM | POA: Diagnosis not present

## 2023-09-17 NOTE — Progress Notes (Signed)
Daily Session Note  Patient Details  Name: [name] MRN: [id number] Date of Birth: 21-May-1976 Referring Provider:   Flowsheet Row Pulmonary Rehab from 09/10/2023 in [facility] Cardiac and Pulmonary Rehab  Referring Provider Dr. Arvilla Meres       Encounter Date: 09/17/2023  Check In:  Session Check In - 09/17/23 1111       Check-In   Supervising physician immediately available to respond to emergencies See telemetry face sheet for immediately available ER MD    Location [facility]-Cardiac & Pulmonary Rehab    Staff Present Maxon Conetta BS, , Exercise Physiologist;Charizma Gardiner [place], RN, BSN;Meredith Jewel Baize, RN BSN;Jason Wallace [place], RDN, LDN    Virtual Visit No    Medication changes reported     No    Fall or balance concerns reported    No    Tobacco Cessation No Change    Warm-up and Cool-down Performed on first and last piece of equipment    Resistance Training Performed Yes    VAD Patient? No    PAD/SET Patient? No      Pain Assessment   Currently in Pain? No/denies                Social History   Tobacco Use  Smoking Status Former   Current packs/day: 0.00   Types: Cigarettes   Quit date: 10/21/2019   Years since quitting: 3.9  Smokeless Tobacco Never    Goals Met:  Proper associated with RPD/PD & O2 Sat Independence with exercise equipment Using PLB without cueing & demonstrates good technique Exercise tolerated well No report of concerns or symptoms today Strength training completed today  Goals Unmet:  Not Applicable  Comments: Pt able to follow exercise prescription today without complaint.  Will continue to monitor for progression.   Dr. Bethann Punches is Medical Director for [facility] Cardiac Rehabilitation.  Dr. Vida Rigger is Medical Director for [facility] Pulmonary Rehabilitation.

## 2023-09-19 ENCOUNTER — Other Ambulatory Visit (HOSPITAL_COMMUNITY): Payer: Self-pay

## 2023-09-19 ENCOUNTER — Encounter: Payer: Self-pay | Admitting: Oncology

## 2023-09-19 ENCOUNTER — Ambulatory Visit: Payer: Managed Care, Other (non HMO)

## 2023-09-19 ENCOUNTER — Encounter: Payer: Managed Care, Other (non HMO) | Admitting: *Deleted

## 2023-09-19 DIAGNOSIS — Z5189 Encounter for other specified aftercare: Secondary | ICD-10-CM | POA: Diagnosis not present

## 2023-09-19 DIAGNOSIS — I272 Pulmonary hypertension, unspecified: Secondary | ICD-10-CM

## 2023-09-19 NOTE — Progress Notes (Signed)
Daily Session Note  Patient Details  Name: [name] MRN: [id number] Date of Birth: 1976/09/11 Referring Provider:   Flowsheet Row Pulmonary Rehab from 09/10/2023 in [facility] Cardiac and Pulmonary Rehab  Referring Provider Dr. Arvilla Meres       Encounter Date: 09/19/2023  Check In:  Session Check In - 09/19/23 1042       Check-In   Supervising physician immediately available to respond to emergencies See telemetry face sheet for immediately available ER MD    Location [facility]-Cardiac & Pulmonary Rehab    Staff Present Cora Collum, RN, BSN, CCRP;Meredith Jewel Baize, RN BSN;Joseph [place]ann[place], Guinevere Ferrari, RN, [place]    Virtual Visit No    Medication changes reported     No    Fall or balance concerns reported    No    Warm-up and Cool-down Performed on first and last piece of equipment    Resistance Training Performed Yes    VAD Patient? No    PAD/SET Patient? No      Pain Assessment   Currently in Pain? No/denies                Social History   Tobacco Use  Smoking Status Former   Current packs/day: 0.00   Types: Cigarettes   Quit date: 10/21/2019   Years since quitting: 3.9  Smokeless Tobacco Never    Goals Met:  Independence with exercise equipment Exercise tolerated well No report of concerns or symptoms today Strength training completed today  Goals Unmet:  Not Applicable  Comments: Pt able to follow exercise prescription today without complaint.  Will continue to monitor for progression.    Dr. Bethann Punches is Medical Director for [facility] Cardiac Rehabilitation.  Dr. Vida Rigger is Medical Director for [facility] Pulmonary Rehabilitation.

## 2023-09-24 ENCOUNTER — Encounter: Payer: Managed Care, Other (non HMO) | Admitting: *Deleted

## 2023-09-24 DIAGNOSIS — Z5189 Encounter for other specified aftercare: Secondary | ICD-10-CM | POA: Diagnosis not present

## 2023-09-24 DIAGNOSIS — I272 Pulmonary hypertension, unspecified: Secondary | ICD-10-CM

## 2023-09-24 NOTE — Progress Notes (Signed)
Daily Session Note  Patient Details  Name: ANJU HAITHCOCK MRN: [id number] Date of Birth: 1976/07/22 Referring Provider:   Flowsheet Row Pulmonary Rehab from 09/10/2023 in [facility] Cardiac and Pulmonary Rehab  Referring Provider Dr. Arvilla Meres       Encounter Date: 09/24/2023  Check In:  Session Check In - 09/24/23 1131       Check-In   Supervising physician immediately available to respond to emergencies See telemetry face sheet for immediately available ER MD    Location [facility]-Cardiac & Pulmonary Rehab    Staff Present Cora Collum, RN, BSN, CCRP;Meredith Jewel Baize, RN BSN;Jason Wallace Cullens, RDN, LDN;Maxon Conetta BS, , Exercise Physiologist    Virtual Visit No    Medication changes reported     No    Fall or balance concerns reported    No    Warm-up and Cool-down Performed on first and last piece of equipment    Resistance Training Performed Yes    VAD Patient? No    PAD/SET Patient? No      Pain Assessment   Currently in Pain? No/denies                Social History   Tobacco Use  Smoking Status Former   Current packs/day: 0.00   Types: Cigarettes   Quit date: 10/21/2019   Years since quitting: 3.9  Smokeless Tobacco Never    Goals Met:  Proper associated with RPD/PD & O2 Sat Independence with exercise equipment Exercise tolerated well No report of concerns or symptoms today  Goals Unmet:  Not Applicable  Comments: Pt able to follow exercise prescription today without complaint.  Will continue to monitor for progression.    Dr. Bethann Punches is Medical Director for [facility] Cardiac Rehabilitation.  Dr. Vida Rigger is Medical Director for [facility] Pulmonary Rehabilitation.

## 2023-09-25 ENCOUNTER — Encounter: Payer: Self-pay | Admitting: *Deleted

## 2023-09-25 DIAGNOSIS — I272 Pulmonary hypertension, unspecified: Secondary | ICD-10-CM

## 2023-09-25 NOTE — Progress Notes (Signed)
Pulmonary Individual Treatment Plan  Patient Details  Name: LATAUNYA WILLBANKS MRN: [id number] Date of Birth: 03-18-1976 Referring Provider:   Flowsheet Row Pulmonary Rehab from 09/10/2023 in [facility] Cardiac and Pulmonary Rehab  Referring Provider Dr. Arvilla Meres       Initial Encounter Date:  Flowsheet Row Pulmonary Rehab from 09/10/2023 in [facility] Cardiac and Pulmonary Rehab  Date 09/10/23       Visit Diagnosis: Pulmonary hypertension (HCC)  Patient's Home Medications on Admission:  Current Outpatient Medications:    dapagliflozin propanediol (FARXIGA) 10 MG TABS tablet, Take 1 tablet (10 mg total) by mouth daily., Disp: 30 tablet, Rfl: 11   digoxin (LANOXIN) 0.125 MG tablet, Take 0.5 tablets (0.0625 mg total) by mouth daily., Disp: 30 tablet, Rfl: 1   Fluticasone-Umeclidin-Vilant (TRELEGY ELLIPTA) 200-62.5-25 MCG/ACT AEPB, Inhale 1 puff into the lungs daily., Disp: 3 each, Rfl: 3   macitentan (OPSUMIT) 10 MG tablet, Take 1 tablet (10 mg total) by mouth daily., Disp: , Rfl:    rosuvastatin (CRESTOR) 10 MG tablet, Take 1 tablet (10 mg total) by mouth daily., Disp: 30 tablet, Rfl: 1   tadalafil, PAH, (ADCIRCA) 20 MG tablet, Take 2 tablets (40 mg total) by mouth daily., Disp: 60 tablet, Rfl: 11   VENTOLIN HFA 108 (90 Base) MCG/ACT inhaler, Inhale 1-2 puffs into the lungs every 4 (four) hours as needed for wheezing., Disp: , Rfl:   Past Medical History: Past Medical History:  Diagnosis Date   Abnormal thyroid function test    Anemia    Anxiety    Bronchitis 06/2023   finished prednisone and currently taking Amoxicillin as of 06-12-23   Cervical lymphadenopathy    Chronic back pain    Ectopic pregnancy    Headache    MIGRAINES   History of kidney stones    HSV infection    Lumbar radiculopathy    Menorrhagia    Palpitations    Pneumonia    06/2023   PONV (postoperative nausea and vomiting)    DURING KIDNEY STONE REMOVAL   Seizures (HCC) 2014   one occurence-due to Tramadol    UTI (urinary tract infection)     Tobacco Use: Social History   Tobacco Use  Smoking Status Former   Current packs/day: 0.00   Types: Cigarettes   Quit date: 10/21/2019   Years since quitting: 3.9  Smokeless Tobacco Never    Labs: Review Flowsheet  More data exists      Latest Ref Rng & Units 04/20/2009 03/01/2021 07/18/2023 07/19/2023 07/22/2023  Labs for ITP Cardiac and Pulmonary Rehab  Cholestrol 0 - 200 mg/dL - - 914  - -  LDL (calc) 0 - 99 mg/dL - - 782  - -  HDL-C >95 mg/dL - - 41  - -  Trlycerides <150 mg/dL - - 79  - -  Hemoglobin A1c 4.8 - 5.6 % - 5.8  - 5.7  -  PH, Arterial 7.35 - 7.45 - - - - 7.459   PCO2 arterial 32 - 48 mmHg - - - - 32.1   Bicarbonate 20.0 - 28.0 mmol/L - - 21.2  - 28.6  28.5  22.7   TCO2 22 - 32 mmol/L 21  - - - 30  30  24    Acid-base deficit 0.0 - 2.0 mmol/L - - 3.1  - -  O2 Saturation % - - 75.1  - 59  57  95     Details       Multiple values from  one day are sorted in reverse-chronological order          Pulmonary Assessment Scores:  Pulmonary Assessment Scores     Row Name 09/10/23 1507 09/12/23 1349       ADL UCSD   ADL Phase -- Entry    SOB Score total -- 5    Rest -- 0    Walk -- 0    Stairs -- 2    Bath -- 1    Dress -- 1    Shop -- 1      CAT Score   CAT Score -- 12      mMRC Score   mMRC Score 1 --             UCSD: Self-administered rating of dyspnea associated with activities of daily living (ADLs) 6-point scale (0 = "not at all" to 5 = "maximal or unable to do because of breathlessness")  Scoring Scores range from 0 to 120.  Minimally important difference is 5 units  CAT: CAT can identify the health impairment of COPD patients and is better correlated with disease progression.  CAT has a scoring range of zero to 40. The CAT score is classified into four groups of low (less than 10), medium (10 - 20), high (21-30) and very high (31-40) based on the impact level of disease on health status. A CAT score  over 10 suggests significant symptoms.  A worsening CAT score could be explained by an exacerbation, poor medication adherence, poor inhaler technique, or progression of COPD or comorbid conditions.  CAT MCID is 2 points  mMRC: mMRC (Modified Medical Research Council) Dyspnea Scale is used to assess the degree of baseline functional disability in patients of respiratory disease due to dyspnea. No minimal important difference is established. A decrease in score of 1 point or greater is considered a positive change.   Pulmonary Function Assessment:   Exercise Target Goals: Exercise Program Goal: Individual exercise prescription set using results from initial 6 min walk test and THRR while considering  patient's activity barriers and safety.   Exercise Prescription Goal: Initial exercise prescription builds to 30-45 minutes a day of aerobic activity, 2-3 days per week.  Home exercise guidelines will be given to patient during program as part of exercise prescription that the participant will acknowledge.  Education: Aerobic Exercise: - Group verbal and visual presentation on the components of exercise prescription. Introduces F.I.T.T principle from ACSM for exercise prescriptions.  Reviews F.I.T.T. principles of aerobic exercise including progression. Written material given at graduation.   Education: Resistance Exercise: - Group verbal and visual presentation on the components of exercise prescription. Introduces F.I.T.T principle from ACSM for exercise prescriptions  Reviews F.I.T.T. principles of resistance exercise including progression. Written material given at graduation.    Education: Exercise & Equipment Safety: - Individual verbal instruction and demonstration of equipment use and safety with use of the equipment. Flowsheet Row Pulmonary Rehab from 09/10/2023 in [facility] Cardiac and Pulmonary Rehab  Date 09/10/23  Educator [facility]  Instruction Review Code 1- Verbalizes Understanding        Education: Exercise Physiology & General Exercise Guidelines: - Group verbal and written instruction with models to review the exercise physiology of the cardiovascular system and associated critical values. Provides general exercise guidelines with specific guidelines to those with heart or lung disease.    Education: Flexibility, Balance, Mind/Body Relaxation: - Group verbal and visual presentation with interactive activity on the components of exercise prescription. Introduces F.I.T.T principle from  ACSM for exercise prescriptions. Reviews F.I.T.T. principles of flexibility and balance exercise training including progression. Also discusses the mind body connection.  Reviews various relaxation techniques to help reduce and manage stress (i.e. Deep breathing, progressive muscle relaxation, and visualization). Balance handout provided to take home. Written material given at graduation.   Activity Barriers & Risk Stratification:  Activity Barriers & Cardiac Risk Stratification - 09/10/23 1456       Activity Barriers & Cardiac Risk Stratification   Activity Barriers None             6 Minute Walk:  6 Minute Walk     Row Name 09/10/23 1453         6 Minute Walk   Phase Initial     Distance 1520 feet     Walk Time 6 minutes     MPH 2.9     METS 4.6     RPE 10     Perceived Dyspnea  1     VO2 Peak 16     Symptoms No     Resting HR 86 bpm     Resting BP 124/70     Resting Oxygen Saturation  95 %     Exercise Oxygen Saturation  during 6 min walk 89 %     Max Ex. HR 123 bpm     Max Ex. BP 134/70     2 Minute Post BP 118/70       Interval HR   1 Minute HR 112     2 Minute HR 118     3 Minute HR 118     4 Minute HR 122     5 Minute HR 123     6 Minute HR 103     2 Minute Post HR 89     Interval Heart Rate? Yes       Interval Oxygen   Interval Oxygen? Yes     Baseline Oxygen Saturation % 95 %     1 Minute Oxygen Saturation % 89 %     1 Minute Liters of Oxygen 0  L     2 Minute Oxygen Saturation % 90 %     2 Minute Liters of Oxygen 0 L     3 Minute Oxygen Saturation % 90 %     3 Minute Liters of Oxygen 0 L     4 Minute Oxygen Saturation % 89 %     4 Minute Liters of Oxygen 0 L     5 Minute Oxygen Saturation % 89 %     5 Minute Liters of Oxygen 0 L     6 Minute Oxygen Saturation % 93 %     6 Minute Liters of Oxygen 0 L     2 Minute Post Oxygen Saturation % 94 %     2 Minute Post Liters of Oxygen 0 L             Oxygen Initial Assessment:  Oxygen Initial Assessment - 09/10/23 1501       Home Oxygen   Home Oxygen Device None    Sleep Oxygen Prescription None    Home Exercise Oxygen Prescription None    Home Resting Oxygen Prescription None    Compliance with Home Oxygen Use Yes      Initial 6 min Walk   Oxygen Used None      Program Oxygen Prescription   Program Oxygen Prescription None      Intervention  Short Term Goals To learn and demonstrate proper pursed lip breathing techniques or other breathing techniques.     Long  Term Goals Exhibits proper breathing techniques, such as pursed lip breathing or other method taught during program session             Oxygen Re-Evaluation:  Oxygen Re-Evaluation     Row Name 09/12/23 1109             Goals/Expected Outcomes   Comments Reviewed PLB technique with pt.  Talked about how it works and it's importance in maintaining their exercise saturations.       Goals/Expected Outcomes Short: Become more profiecient at using PLB. Long: Become independent at using PLB.                Oxygen Discharge (Final Oxygen Re-Evaluation):  Oxygen Re-Evaluation - 09/12/23 1109       Goals/Expected Outcomes   Comments Reviewed PLB technique with pt.  Talked about how it works and it's importance in maintaining their exercise saturations.    Goals/Expected Outcomes Short: Become more profiecient at using PLB. Long: Become independent at using PLB.             Initial  Exercise Prescription:  Initial Exercise Prescription - 09/10/23 1500       NuStep   Level 3    SPM 80    Minutes 15    METs 4.6      Recumbant Elliptical   Level 3    RPM 50    Minutes 15    METs 4.6             Perform Capillary Blood Glucose checks as needed.  Exercise Prescription Changes:   Exercise Prescription Changes     Row Name 09/10/23 1400             Response to Exercise   Blood Pressure (Admit) 124/70       Blood Pressure (Exercise) 134/70       Blood Pressure (Exit) 118/70       Heart Rate (Admit) 86 bpm       Heart Rate (Exercise) 123 bpm       Heart Rate (Exit) 89 bpm       Oxygen Saturation (Admit) 95 %       Oxygen Saturation (Exercise) 89 %       Oxygen Saturation (Exit) 94 %       Rating of Perceived Exertion (Exercise) 10       Perceived Dyspnea (Exercise) 1       Symptoms none       Comments results       Duration Progress to 30 minutes of  aerobic without signs/symptoms of physical distress       Intensity THRR New         Progression   Progression Continue to progress workloads to maintain intensity without signs/symptoms of physical distress.       Average METs 4.6                Exercise Comments:   Exercise Comments     Row Name 09/12/23 1108           Exercise Comments First full day of exercise!  Patient was oriented to gym and equipment including functions, settings, policies, and procedures.  Patient's individual exercise prescription and treatment plan were reviewed.  All starting workloads were established based on the results of the 6 minute walk test  done at initial orientation visit.  The plan for exercise progression was also introduced and progression will be customized based on patient's performance and goals.                Exercise Goals and Review:   Exercise Goals     Row Name 09/10/23 1500             Exercise Goals   Increase Physical Activity Yes       Intervention Develop an  individualized exercise prescription for aerobic and resistive training based on initial evaluation findings, risk stratification, comorbidities and participant's personal goals.;Provide advice, education, support and counseling about physical activity/exercise needs.       Expected Outcomes Long Term: Exercising regularly at least 3-5 days a week.;Long Term: Add in home exercise to make exercise part of routine and to increase amount of physical activity.;Short Term: Attend rehab on a regular basis to increase amount of physical activity.       Increase Strength and Stamina Yes       Intervention Provide advice, education, support and counseling about physical activity/exercise needs.;Develop an individualized exercise prescription for aerobic and resistive training based on initial evaluation findings, risk stratification, comorbidities and participant's personal goals.       Expected Outcomes Long Term: Improve cardiorespiratory fitness, muscular endurance and strength as measured by increased METs and functional capacity ( );Short Term: Perform resistance training exercises routinely during rehab and add in resistance training at home;Short Term: Increase workloads from initial exercise prescription for resistance, speed, and METs.       Able to understand and use rate of perceived exertion (RPE) scale Yes       Intervention Provide education and explanation on how to use RPE scale       Expected Outcomes Long Term:  Able to use RPE to guide intensity level when exercising independently;Short Term: Able to use RPE daily in rehab to express subjective intensity level       Able to understand and use Dyspnea scale Yes       Intervention Provide education and explanation on how to use Dyspnea scale       Expected Outcomes Long Term: Able to use Dyspnea scale to guide intensity level when exercising independently;Short Term: Able to use Dyspnea scale daily in rehab to express subjective sense of  shortness of breath during exertion       Knowledge and understanding of Target Heart Rate Range (THRR) Yes       Intervention Provide education and explanation of THRR including how the numbers were predicted and where they are located for reference       Expected Outcomes Long Term: Able to use THRR to govern intensity when exercising independently;Short Term: Able to use daily as guideline for intensity in rehab;Short Term: Able to state/look up THRR       Able to check pulse independently Yes       Intervention Review the importance of being able to check your own pulse for safety during independent exercise;Provide education and demonstration on how to check pulse in carotid and radial arteries.       Expected Outcomes Long Term: Able to check pulse independently and accurately;Short Term: Able to explain why pulse checking is important during independent exercise       Understanding of Exercise Prescription Yes       Intervention Provide education, explanation, and written materials on patient's individual exercise prescription  Expected Outcomes Long Term: Able to explain home exercise prescription to exercise independently;Short Term: Able to explain program exercise prescription                Exercise Goals Re-Evaluation :  Exercise Goals Re-Evaluation     Row Name 09/12/23 1108             Exercise Goal Re-Evaluation   Exercise Goals Review Able to understand and use rate of perceived exertion (RPE) scale;Able to understand and use Dyspnea scale;Knowledge and understanding of Target Heart Rate Range (THRR);Understanding of Exercise Prescription       Comments Reviewed RPE and dyspnea scale, THR and program prescription with pt today.  Pt voiced understanding and was given a copy of goals to take home.       Expected Outcomes Short: Use RPE daily to regulate intensity. Long: Follow program prescription in THR.                Discharge Exercise Prescription (Final  Exercise Prescription Changes):  Exercise Prescription Changes - 09/10/23 1400       Response to Exercise   Blood Pressure (Admit) 124/70    Blood Pressure (Exercise) 134/70    Blood Pressure (Exit) 118/70    Heart Rate (Admit) 86 bpm    Heart Rate (Exercise) 123 bpm    Heart Rate (Exit) 89 bpm    Oxygen Saturation (Admit) 95 %    Oxygen Saturation (Exercise) 89 %    Oxygen Saturation (Exit) 94 %    Rating of Perceived Exertion (Exercise) 10    Perceived Dyspnea (Exercise) 1    Symptoms none    Comments results    Duration Progress to 30 minutes of  aerobic without signs/symptoms of physical distress    Intensity THRR New      Progression   Progression Continue to progress workloads to maintain intensity without signs/symptoms of physical distress.    Average METs 4.6             Nutrition:  Target Goals: Understanding of nutrition guidelines, daily intake of sodium 1500mg , cholesterol 200mg , calories 30% from fat and 7% or less from saturated fats, daily to have 5 or more servings of fruits and vegetables.  Education: All About Nutrition: -Group instruction provided by verbal, written material, interactive activities, discussions, models, and posters to present general guidelines for heart healthy nutrition including fat, fiber, MyPlate, the role of sodium in heart healthy nutrition, utilization of the nutrition label, and utilization of this knowledge for meal planning. Follow up email sent as well. Written material given at graduation.   Biometrics:  Pre Biometrics - 09/10/23 1459       Pre Biometrics   Height 5' 3.9" (1.623 m)    Weight 166 lb 11.2 oz (75.6 kg)    Waist Circumference 37.5 inches    Hip Circumference 42.5 inches    Waist to Hip Ratio 0.88 %    BMI (Calculated) 28.71    Single Leg Stand 9.53 seconds              Nutrition Therapy Plan and Nutrition Goals:   Nutrition Assessments:  MEDIFICTS Score Key: >=70 Need to make dietary  changes  40-70 Heart Healthy Diet <= 40 Therapeutic Level Cholesterol Diet  Flowsheet Row Pulmonary Rehab from 09/10/2023 in [facility] Cardiac and Pulmonary Rehab  Picture Your Plate Total Score on Admission 49      Picture Your Plate Scores: <04 Unhealthy dietary pattern with much  room for improvement. 41-50 Dietary pattern unlikely to meet recommendations for good health and room for improvement. 51-60 More healthful dietary pattern, with some room for improvement.  >60 Healthy dietary pattern, although there may be some specific behaviors that could be improved.   Nutrition Goals Re-Evaluation:   Nutrition Goals Discharge (Final Nutrition Goals Re-Evaluation):   Psychosocial: Target Goals: Acknowledge presence or absence of significant depression and/or stress, maximize coping skills, provide positive support system. Participant is able to verbalize types and ability to use techniques and skills needed for reducing stress and depression.   Education: Stress, Anxiety, and Depression - Group verbal and visual presentation to define topics covered.  Reviews how body is impacted by stress, anxiety, and depression.  Also discusses healthy ways to reduce stress and to treat/manage anxiety and depression.  Written material given at graduation.   Education: Sleep Hygiene -Provides group verbal and written instruction about how sleep can affect your health.  Define sleep hygiene, discuss sleep cycles and impact of sleep habits. Review good sleep hygiene tips.    Initial Review & Psychosocial Screening:  Initial Psych Review & Screening - 09/05/23 1012       Family Dynamics   Good Support System? Yes   Mom and Dad,     Barriers   Psychosocial barriers to participate in program There are no identifiable barriers or psychosocial needs.      Screening Interventions   Interventions To provide support and resources with identified psychosocial needs;Provide feedback about the scores to  participant;Encouraged to exercise    Expected Outcomes Short Term goal: Utilizing psychosocial counselor, staff and physician to assist with identification of specific Stressors or current issues interfering with healing process. Setting desired goal for each stressor or current issue identified.;Long Term Goal: Stressors or current issues are controlled or eliminated.;Short Term goal: Identification and review with participant of any Quality of Life or Depression concerns found by scoring the questionnaire.;Long Term goal: The participant improves quality of Life and PHQ9 Scores as seen by post scores and/or verbalization of changes             Quality of Life Scores:  Scores of 19 and below usually indicate a poorer quality of life in these areas.  A difference of  2-3 points is a clinically meaningful difference.  A difference of 2-3 points in the total score of the Quality of Life Index has been associated with significant improvement in overall quality of life, self-image, physical symptoms, and general health in studies assessing change in quality of life.  PHQ-9: Review Flowsheet       09/10/2023  Depression screen PHQ 2/9  Decreased Interest 0  Down, Depressed, Hopeless 0  PHQ - 2 Score 0  Altered sleeping 1  Tired, decreased energy 1  Change in appetite 0  Feeling bad or failure about yourself  0  Trouble concentrating 0  Moving slowly or fidgety/restless 0  Suicidal thoughts 0  PHQ-9 Score 2  Difficult doing work/chores Not difficult at all    Details           Interpretation of Total Score  Total Score Depression Severity:  1-4 = Minimal depression, 5-9 = Mild depression, 10-14 = Moderate depression, 15-19 = Moderately severe depression, 20-27 = Severe depression   Psychosocial Evaluation and Intervention:  Psychosocial Evaluation - 09/05/23 1026       Psychosocial Evaluation & Interventions   Interventions Encouraged to exercise with the program and follow  exercise prescription  Comments Jakeisha has no barriers to attending the program.  She did have anxiety about exercise after her HR went up to 155 when she got up to move in the hospital. TAlking with her physician ,she feels less anxiety being sent to the PR program and being in a clinical place to exercise. She has a new diagnosis of PUlmonary hypertension and is working with her doctors to do all they can to keep it controlled, and to understand how to manage with the disease.  She has support from her Mom and Dad. She lives with her 2 children.  She is ready to get started with the program.    Expected Outcomes STG attends all scheduled sessions, continues to work on reducing any anxiety about exercising with Pul HTN. LTG  Continues exercise progression after discharge, continue to work with her physician for any anxiety control    Continue Psychosocial Services  Follow up required by staff             Psychosocial Re-Evaluation:   Psychosocial Discharge (Final Psychosocial Re-Evaluation):   Education: Education Goals: Education classes will be provided on a weekly basis, covering required topics. Participant will state understanding/return demonstration of topics presented.  Learning Barriers/Preferences:  Learning Barriers/Preferences - 09/05/23 1013       Learning Barriers/Preferences   Learning Barriers None    Learning Preferences None             General Pulmonary Education Topics:  Infection Prevention: - Provides verbal and written material to individual with discussion of infection control including proper hand washing and proper equipment cleaning during exercise session. Flowsheet Row Pulmonary Rehab from 09/10/2023 in [facility] Cardiac and Pulmonary Rehab  Date 09/10/23  Educator [facility]  Instruction Review Code 1- Verbalizes Understanding       Falls Prevention: - Provides verbal and written material to individual with discussion of falls prevention and  safety. Flowsheet Row Pulmonary Rehab from 09/10/2023 in [facility] Cardiac and Pulmonary Rehab  Date 09/10/23  Educator [facility]  Instruction Review Code 1- Verbalizes Understanding       Chronic Lung Disease Review: - Group verbal instruction with posters, models, PowerPoint presentations and videos,  to review new updates, new respiratory medications, new advancements in procedures and treatments. Providing information on websites and "800" numbers for continued self-education. Includes information about supplement oxygen, available portable oxygen systems, continuous and intermittent flow rates, oxygen safety, concentrators, and Medicare reimbursement for oxygen. Explanation of Pulmonary Drugs, including class, frequency, complications, importance of spacers, rinsing mouth after steroid MDI's, and proper cleaning methods for nebulizers. Review of basic lung anatomy and physiology related to function, structure, and complications of lung disease. Review of risk factors. Discussion about methods for diagnosing sleep apnea and types of masks and machines for OSA. Includes a review of the use of types of environmental controls: home humidity, furnaces, filters, dust mite/pet prevention, HEPA vacuums. Discussion about weather changes, air quality and the benefits of nasal washing. Instruction on Warning signs, infection symptoms, calling MD promptly, preventive modes, and value of vaccinations. Review of effective airway clearance, coughing and/or vibration techniques. Emphasizing that all should Create an Action Plan. Written material given at graduation.   AED/CPR: - Group verbal and written instruction with the use of models to demonstrate the basic use of the AED with the basic ABC's of resuscitation.    Anatomy and Cardiac Procedures: - Group verbal and visual presentation and models provide information about basic cardiac anatomy and function. Reviews the testing  methods done to diagnose heart disease and  the outcomes of the test results. Describes the treatment choices: Medical Management, Angioplasty, or Coronary Bypass Surgery for treating various heart conditions including Myocardial Infarction, Angina, Valve Disease, and Cardiac Arrhythmias.  Written material given at graduation.   Medication Safety: - Group verbal and visual instruction to review commonly prescribed medications for heart and lung disease. Reviews the medication, class of the drug, and side effects. Includes the steps to properly store meds and maintain the prescription regimen.  Written material given at graduation.   Other: -Provides group and verbal instruction on various topics (see comments)   Knowledge Questionnaire Score:    Core Components/Risk Factors/Patient Goals at Admission:  Personal Goals and Risk Factors at Admission - 09/05/23 1016       Core Components/Risk Factors/Patient Goals on Admission    Weight Management Yes    Intervention Weight Management: Develop a combined nutrition and exercise program designed to reach desired caloric intake, while maintaining appropriate intake of nutrient and fiber, sodium and fats, and appropriate energy expenditure required for the weight goal.;Weight Management: Provide education and appropriate resources to help participant work on and attain dietary goals.    Admit Weight 164 lb (74.4 kg)   has lost 7 lbs   Goal Weight: Short Term 163 lb (73.9 kg)    Goal Weight: Long Term 135 lb (61.2 kg)    Expected Outcomes Short Term: Continue to assess and modify interventions until short term weight is achieved;Long Term: Adherence to nutrition and physical activity/exercise program aimed toward attainment of established weight goal;Weight Loss: Understanding of general recommendations for a balanced deficit meal plan, which promotes 1-2 lb weight loss per week and includes a negative energy balance of [phone] kcal/d    Improve shortness of breath with ADL's Yes     Intervention Provide education, individualized exercise plan and daily activity instruction to help decrease symptoms of SOB with activities of daily living.    Expected Outcomes Short Term: Improve cardiorespiratory fitness to achieve a reduction of symptoms when performing ADLs;Long Term: Be able to perform more ADLs without symptoms or delay the onset of symptoms    Heart Failure Yes    Intervention Provide a combined exercise and nutrition program that is supplemented with education, support and counseling about heart failure. Directed toward relieving symptoms such as shortness of breath, decreased exercise tolerance, and extremity edema.    Expected Outcomes Improve functional capacity of life;Short term: Attendance in program 2-3 days a week with increased exercise capacity. Reported lower sodium intake. Reported increased fruit and vegetable intake. Reports medication compliance.;Short term: Daily weights obtained and reported for increase. Utilizing diuretic protocols set by physician.;Long term: Adoption of self-care skills and reduction of barriers for early signs and symptoms recognition and intervention leading to self-care maintenance.    Lipids Yes    Intervention Provide education and support for participant on nutrition & aerobic/resistive exercise along with prescribed medications to achieve LDL 70mg , HDL >40mg .    Expected Outcomes Short Term: Participant states understanding of desired cholesterol values and is compliant with medications prescribed. Participant is following exercise prescription and nutrition guidelines.;Long Term: Cholesterol controlled with medications as prescribed, with individualized exercise RX and with personalized nutrition plan. Value goals: LDL < 70mg , HDL > 40 mg.             Education:Diabetes - Individual verbal and written instruction to review signs/symptoms of diabetes, desired ranges of glucose level fasting, after meals and with  exercise.  Acknowledge that pre and post exercise glucose checks will be done for 3 sessions at entry of program.   Know Your Numbers and Heart Failure: - Group verbal and visual instruction to discuss disease risk factors for cardiac and pulmonary disease and treatment options.  Reviews associated critical values for Overweight/Obesity, Hypertension, Cholesterol, and Diabetes.  Discusses basics of heart failure: signs/symptoms and treatments.  Introduces Heart Failure Zone chart for action plan for heart failure.  Written material given at graduation.   Core Components/Risk Factors/Patient Goals Review:    Core Components/Risk Factors/Patient Goals at Discharge (Final Review):    ITP Comments:  ITP Comments     Row Name 09/05/23 1033 09/10/23 1453 09/12/23 1107 09/25/23 1007     ITP Comments Virtual orientation call completed today. shehas an appointment on Date: 09/10/2023  for EP eval and gym Orientation.  Documentation of diagnosis can be found in [facility] Date: 08/30/2023 . Completed and gym orientation. Initial ITP created and sent for review to Dr. Jinny Sanders, Medical Director. First full day of exercise!  Patient was oriented to gym and equipment including functions, settings, policies, and procedures.  Patient's individual exercise prescription and treatment plan were reviewed.  All starting workloads were established based on the results of the 6 minute walk test done at initial orientation visit.  The plan for exercise progression was also introduced and progression will be customized based on patient's performance and goals. 30 Day review completed. Medical Director ITP review done, changes made as directed, and signed approval by Medical Director.    new to program             Comments:

## 2023-09-26 ENCOUNTER — Ambulatory Visit: Payer: Managed Care, Other (non HMO)

## 2023-09-26 ENCOUNTER — Telehealth: Payer: Self-pay | Admitting: Cardiology

## 2023-09-26 ENCOUNTER — Encounter: Payer: Managed Care, Other (non HMO) | Admitting: *Deleted

## 2023-09-26 DIAGNOSIS — I272 Pulmonary hypertension, unspecified: Secondary | ICD-10-CM

## 2023-09-26 DIAGNOSIS — Z5189 Encounter for other specified aftercare: Secondary | ICD-10-CM | POA: Diagnosis not present

## 2023-09-26 NOTE — Telephone Encounter (Signed)
Called to schedule sleep compliance appointment with Dr. Mayford Knife, pt turned machine in. FYI

## 2023-09-26 NOTE — Progress Notes (Signed)
Daily Session Note  Patient Details  Name: GLENDI TOTZKE MRN: [id number] Date of Birth: [birth year] Referring Provider:   Flowsheet Row Pulmonary Rehab from 09/10/2023 in [facility] Cardiac and Pulmonary Rehab  Referring Provider Dr. Arvilla Meres       Encounter Date: 09/26/2023  Check In:  Session Check In - 09/26/23 1104       Check-In   Supervising physician immediately available to respond to emergencies See telemetry face sheet for immediately available ER MD    Location [facility]-Cardiac & Pulmonary Rehab    Staff Present Ronette Deter, BS, Exercise Physiologist;Joseph [place], RCP,RRT,BSRT;Meredith [place], RN Atilano Median, RN, ADN    Virtual Visit No    Medication changes reported     No    Fall or balance concerns reported    No    Warm-up and Cool-down Performed on first and last piece of equipment    Resistance Training Performed Yes    VAD Patient? No    PAD/SET Patient? No      Pain Assessment   Currently in Pain? No/denies                Social History   Tobacco Use  Smoking Status Former   Current packs/day: 0.00   Types: Cigarettes   Quit date: 10/21/2019   Years since quitting: 3.9  Smokeless Tobacco Never    Goals Met:  Independence with exercise equipment Exercise tolerated well No report of concerns or symptoms today Strength training completed today  Goals Unmet:  Not Applicable  Comments: Pt able to follow exercise prescription today without complaint.  Will continue to monitor for progression.    Dr. Bethann Punches is Medical Director for [facility] Cardiac Rehabilitation.  Dr. Vida Rigger is Medical Director for [facility] Pulmonary Rehabilitation.

## 2023-09-30 ENCOUNTER — Ambulatory Visit: Payer: Managed Care, Other (non HMO)

## 2023-10-01 ENCOUNTER — Encounter: Payer: Managed Care, Other (non HMO) | Admitting: *Deleted

## 2023-10-01 DIAGNOSIS — Z5189 Encounter for other specified aftercare: Secondary | ICD-10-CM | POA: Diagnosis not present

## 2023-10-01 DIAGNOSIS — I272 Pulmonary hypertension, unspecified: Secondary | ICD-10-CM

## 2023-10-01 NOTE — Progress Notes (Signed)
Daily Session Note  Patient Details  Name: [name] MRN: [id number] Date of Birth: 1976-09-19 Referring Provider:   Flowsheet Row Pulmonary Rehab from 09/10/2023 in [facility] Cardiac and Pulmonary Rehab  Referring Provider Dr. Arvilla Meres       Encounter Date: 10/01/2023  Check In:  Session Check In - 10/01/23 1121       Check-In   Supervising physician immediately available to respond to emergencies See telemetry face sheet for immediately available ER MD    Location [facility]-Cardiac & Pulmonary Rehab    Staff Present Cora Collum, RN, BSN, CCRP;Jason Wallace Cullens, RDN, LDN;Meredith Jewel Baize, RN BSN;Maxon Conetta BS, , Exercise Physiologist    Virtual Visit No    Medication changes reported     No    Fall or balance concerns reported    No    Warm-up and Cool-down Performed on first and last piece of equipment    Resistance Training Performed Yes    VAD Patient? No    PAD/SET Patient? No      Pain Assessment   Currently in Pain? No/denies                Social History   Tobacco Use  Smoking Status Former   Current packs/day: 0.00   Types: Cigarettes   Quit date: 10/21/2019   Years since quitting: 3.9  Smokeless Tobacco Never    Goals Met:  Proper associated with RPD/PD & O2 Sat Independence with exercise equipment Exercise tolerated well No report of concerns or symptoms today  Goals Unmet:  Not Applicable  Comments: Pt able to follow exercise prescription today without complaint.  Will continue to monitor for progression.    Dr. Bethann Punches is Medical Director for [facility] Cardiac Rehabilitation.  Dr. Vida Rigger is Medical Director for [facility] Pulmonary Rehabilitation.

## 2023-10-01 NOTE — Telephone Encounter (Signed)
Call to patient to ask why she turned her cpap machine in, no answer. No DPR on file. Left message with no identifiers asking recipient to call [place] at our office #.

## 2023-10-07 ENCOUNTER — Ambulatory Visit: Payer: Managed Care, Other (non HMO)

## 2023-10-08 ENCOUNTER — Ambulatory Visit: Payer: Managed Care, Other (non HMO) | Attending: Internal Medicine | Admitting: Internal Medicine

## 2023-10-08 ENCOUNTER — Encounter: Payer: Self-pay | Admitting: Internal Medicine

## 2023-10-08 ENCOUNTER — Other Ambulatory Visit (HOSPITAL_COMMUNITY): Payer: Self-pay

## 2023-10-08 VITALS — BP 127/81 | HR 85 | Wt 167.0 lb

## 2023-10-08 DIAGNOSIS — G4733 Obstructive sleep apnea (adult) (pediatric): Secondary | ICD-10-CM | POA: Diagnosis not present

## 2023-10-08 DIAGNOSIS — E663 Overweight: Secondary | ICD-10-CM

## 2023-10-08 DIAGNOSIS — I50810 Right heart failure, unspecified: Secondary | ICD-10-CM

## 2023-10-08 DIAGNOSIS — I272 Pulmonary hypertension, unspecified: Secondary | ICD-10-CM

## 2023-10-08 MED ORDER — MOUNJARO 2.5 MG/0.5ML ~~LOC~~ SOAJ: 2.5000 mg | SUBCUTANEOUS | 1 refills | Status: DC

## 2023-10-08 NOTE — Patient Instructions (Addendum)
Great to see you today!!!  START Mounjaro 2.5 mg SQ Weekly  Your physician has requested that you have an echocardiogram. Echocardiography is a painless test that uses sound waves to create images of your heart. It provides your doctor with information about the size and shape of your heart and how well your heart's chambers and valves are working. This procedure takes approximately one hour. There are no restrictions for this procedure. Please do NOT wear cologne, perfume, aftershave, or lotions (deodorant is allowed). Please arrive 15 minutes prior to your appointment time.  Please note: We ask at that you not bring children with you during ultrasound (echo/ vascular) testing. Due to room size and safety concerns, children are not allowed in the ultrasound rooms during exams. Our front office staff cannot provide observation of children in our lobby area while testing is being conducted. An adult accompanying a patient to their appointment will only be allowed in the ultrasound room at the discretion of the ultrasound technician under special circumstances. We apologize for any inconvenience.  Your physician recommends that you schedule a follow-up appointment in: 3 months (March 2025), we will call you closer to that time to schedule

## 2023-10-08 NOTE — Progress Notes (Signed)
ADVANCED HEART FAILURE CLINIC NOTE  Referring Physician: Larena Glassman, PA  Primary Care: Larena Glassman, PA HF: Dr. Gala Romney    HPI:  [name] is a 47 y.o. 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  She has had sinus tachycardia for many years.  She had an echocardiogram in April 2022 with normal LV/RV function and an RVSP of 36 mmHg.  Over the past year (2023-2024) she has had progressive dyspnea on exertion, lower extremity edema and bloating.  On 07/18/2023 she underwent elective hysterectomy which was complicated post procedurally by hypoxia and near syncope.  Postoperative CTA chest negative for PE.  Echocardiogram with EF of 55 to 60%, grade 2 diastolic dysfunction and moderately enlarged right ventricle with PASP of 83 mmHg with moderate to severe TR.  She was started on sildenafil and patient and presents today for follow-up.  Cath 9/24: Mild non-obstructive CAD (40%mLAD). RA 4 PA 71/27 (41) PCW 4 Fick 3.6/2.1 TD 3.2/2.8 PVR10.2 PaPi 11  Sildenafil started in 9/24. Started Opsumit 10/5.  Sleep study 10/24 AHI 9.6/hr. Now following with Dr. Mayford Knife. Pending CPAP  PFTs 10/24  FEV1  1.56 (57%) FVC 2.44 (70%) DLCO 62%  Now on Opsumit 10 daily and Tadalafi 40 daily   Since we last saw her she has seen Pulmonary and enrolled in Pulmonary rehab. Feels better but still feels like her abdomen is limiting her from getting a full deep breath. In PR does 20 mins elliptical and TM + weights. On TM will walk 3. + 3% incline. No syncope or presyncope. No edema.   Past Medical History:  Diagnosis Date   Abnormal thyroid function test    Anemia    Anxiety    Bronchitis 06/2023   finished prednisone and currently taking Amoxicillin as of 06-12-23   Cervical lymphadenopathy    Chronic back pain    Ectopic pregnancy    Headache    MIGRAINES   History of kidney stones    HSV infection    Lumbar radiculopathy     Menorrhagia    Palpitations    Pneumonia    06/2023   PONV (postoperative nausea and vomiting)    DURING KIDNEY STONE REMOVAL   Seizures (HCC) 2014   one occurence-due to Tramadol   UTI (urinary tract infection)     Current Outpatient Medications  Medication Sig Dispense Refill   dapagliflozin propanediol (FARXIGA) 10 MG TABS tablet Take 1 tablet (10 mg total) by mouth daily. 30 tablet 11   digoxin (LANOXIN) 0.125 MG tablet Take 0.5 tablets (0.0625 mg total) by mouth daily. 30 tablet 1   Fluticasone-Umeclidin-Vilant (TRELEGY ELLIPTA) 200-62.5-25 MCG/ACT AEPB Inhale 1 puff into the lungs daily. 3 each 3   macitentan (OPSUMIT) 10 MG tablet Take 1 tablet (10 mg total) by mouth daily.     rosuvastatin (CRESTOR) 10 MG tablet Take 1 tablet (10 mg total) by mouth daily. 30 tablet 1   tadalafil, PAH, (ADCIRCA) 20 MG tablet Take 2 tablets (40 mg total) by mouth daily. 60 tablet 11   VENTOLIN HFA 108 (90 Base) MCG/ACT inhaler Inhale 1-2 puffs into the lungs every 4 (four) hours as needed for wheezing.     No current facility-administered medications for this visit.    Allergies  Allergen Reactions   Sulfa Antibiotics Hives and Itching   Tramadol Other (See Comments)    seizures   Compazine [Prochlorperazine] Anxiety    Tachycardia  Social History   Socioeconomic History   Marital status: Divorced    Spouse name: Not on file   Number of children: Not on file   Years of education: Not on file   Highest education level: Not on file  Occupational History   Not on file  Tobacco Use   Smoking status: Former    Current packs/day: 0.00    Types: Cigarettes    Quit date: 10/21/2019    Years since quitting: 3.9   Smokeless tobacco: Never  Vaping Use   Vaping status: Former   Quit date: 06/06/2023   Substances: Nicotine, Flavoring  Substance and Sexual Activity   Alcohol use: No   Drug use: No   Sexual activity: Yes  Other Topics Concern   Not on file  Social History  Narrative   Not on file   Social Determinants of Health   Financial Resource Strain: Not on file  Food Insecurity: No Food Insecurity (07/21/2023)   Hunger Vital Sign    Worried About Running Out of Food in the Last Year: Never true    Ran Out of Food in the Last Year: Never true  Transportation Needs: No Transportation Needs (07/21/2023)   PRAPARE - Administrator, Civil Service (Medical): No    Lack of Transportation (Non-Medical): No  Physical Activity: Not on file  Stress: Not on file  Social Connections: Not on file  Intimate Partner Violence: Not At Risk (07/21/2023)   Humiliation, Afraid, Rape, and Kick questionnaire    Fear of Current or Ex-Partner: No    Emotionally Abused: No    Physically Abused: No    Sexually Abused: No      Family History  Problem Relation Age of Onset   Cancer Maternal Grandmother     PHYSICAL EXAM: Vitals:   10/08/23 1051  BP: 127/81  Pulse: 85  SpO2: 94%   General:  Well appearing. No resp difficulty HEENT: normal Neck: supple. no JVD. Carotids 2+ bilat; no bruits. No lymphadenopathy or thryomegaly appreciated. Cor: PMI nondisplaced. Regular rate & rhythm. No rubs, gallops or murmurs. Lungs: clear Abdomen: soft, nontender, nondistended. No hepatosplenomegaly. No bruits or masses. Good bowel sounds. Extremities: no cyanosis, clubbing, rash, edema Neuro: alert & orientedx3, cranial nerves grossly intact. moves all 4 extremities w/o difficulty. Affect pleasant   Wt Readings from Last 3 Encounters:  10/08/23 167 lb (75.8 kg)  09/12/23 168 lb 3.2 oz (76.3 kg)  09/10/23 166 lb 11.2 oz (75.6 kg)      DATA REVIEW  ECG: 07/29/23: sinus tachycardia  as per my personal interpretation  ECHO: 07/19/23: LVEF 55-60%, Grade II DD, RV moderately enlarged with mild to moderately reduced function as per my personal interpretation  CATH: 9/16 Ao = 102/71 (85) LV = 101/6 RA = 4  RV = 70/7 PA = 71/27 (41) PCW = 4 Fick cardiac  output/index = 3.6/2.1 Thermo CO/CI = 3.2/1.8 PVR = 10.2 WU FA sat = 95% PA sat = 57%, 59% PAPi = 11    Assessment: 1. Mild non-obstructive CAD  2. LVEF 55-60% 3. Moderate PAH with moderate to severely reduced CO  V/Q SCAN (07/23/23) Slight heterogeneous pulmonary perfusion but no wedge-shaped perfusion defects to suggest pulmonary embolus.    ASSESSMENT & PLAN:  Pulmonary Hypertension - Likely predominantly group 1 with some degree of group III  - ECHO  9/24 LVEF 55-60% G2DD. RV mild to moderately reduced function with septal flattening. Mod-sev TR RVSP 83 - CT  negative for PE, V/Q scan with some mild heterogenity but no definitive sign of chronic PE - Cath 9/24: RA 4 PA 71/27 (41) PCW  4 Fick 3.6/2.1 Thermo CO/CI 3.2/1.8 PVR 10.2 WU PAPi 11  - CTD serologies: ESR 8, CRP 1, mildly elevated C3 complement at 193, negative anti-SCL, negative ANCA panel & negative ANA. Consider Rheum referral - V/Q scan negative.  - Mild OSA. Has f/u with Dr. Mayford Knife. Starting CPAP  - PFTs with DLCO with mild to moderate obstructive/restrictive lung disease -> followed by Pulmonary - CTA chest personally; does not appear to have much parenchymal lung disease. - Currently on tadalafil 40 daily and Opsumit 10. Tolerating well  - Much improved NYHA II . Volume status ok . - BNP 171 - 6 min walk test 09/10/23 - 1520 feet (462m) with O2 sat nadir 89% on room air.  - Reveal Lite risk score today = 4 (low risk) - Will repeat echo to reassess pulmonary pressures and decide on need for 3rd agent PAH agent. Discussed need for possible RHC for further decision making regarding need for triple or quadruple therapy  2. Chronic RV Failure/cor pulmonale - Dilated RV on TTE w/ moderately reduced function - Continue dig 0.0625 - Plan as abve  3. Hyperlipidemia - crestor 10mg   4. Overweight - Body mass index is 29.12 kg/m. - Previously on semaglutidie via internet  - Overweight contributing to OSA and PAH.  Will see if we can get her [facility] via PharmD  5. Abnormal PFTs - has obstructive/restrictive pattern on PFTs - has been followed by Pulmonary  6. OSA - AHI 9.6  - unable to tolerate CPAP - will work on weight loss    Arvilla Meres, MD  11:11 AM

## 2023-10-10 ENCOUNTER — Encounter: Payer: Managed Care, Other (non HMO) | Attending: Internal Medicine | Admitting: *Deleted

## 2023-10-10 DIAGNOSIS — I272 Pulmonary hypertension, unspecified: Secondary | ICD-10-CM | POA: Diagnosis present

## 2023-10-10 DIAGNOSIS — R0609 Other forms of dyspnea: Secondary | ICD-10-CM | POA: Insufficient documentation

## 2023-10-10 DIAGNOSIS — J449 Chronic obstructive pulmonary disease, unspecified: Secondary | ICD-10-CM | POA: Insufficient documentation

## 2023-10-10 DIAGNOSIS — Z87891 Personal history of nicotine dependence: Secondary | ICD-10-CM | POA: Diagnosis not present

## 2023-10-10 NOTE — Progress Notes (Signed)
Daily Session Note  Patient Details  Name: [name] MRN: [id number] Date of Birth: 10-27-1976 Referring Provider:   Flowsheet Row Pulmonary Rehab from 09/10/2023 in [facility] Cardiac and Pulmonary Rehab  Referring Provider Dr. Arvilla Meres       Encounter Date: 10/10/2023  Check In:  Session Check In - 10/10/23 1109       Check-In   Supervising physician immediately available to respond to emergencies See telemetry face sheet for immediately available ER MD    Location [facility]-Cardiac & Pulmonary Rehab    Staff Present Cora Collum, RN, BSN, CCRP;Margaret Best, MS, Exercise Physiologist;Joseph Reino Kent, RCP,RRT,BSRT;Meredith Jewel Baize, RN BSN    Virtual Visit No    Medication changes reported     Yes    Comments started [employer] and Cool-down Performed on first and last piece of equipment    Resistance Training Performed Yes    VAD Patient? No    PAD/SET Patient? No      Pain Assessment   Currently in Pain? No/denies                Social History   Tobacco Use  Smoking Status Former   Current packs/day: 0.00   Types: Cigarettes   Quit date: 10/21/2019   Years since quitting: 3.9  Smokeless Tobacco Never    Goals Met:  Proper associated with RPD/PD & O2 Sat Independence with exercise equipment Exercise tolerated well No report of concerns or symptoms today  Goals Unmet:  Not Applicable  Comments: Pt able to follow exercise prescription today without complaint.  Will continue to monitor for progression.    Dr. Bethann Punches is Medical Director for [facility] Cardiac Rehabilitation.  Dr. Vida Rigger is Medical Director for [facility] Pulmonary Rehabilitation.

## 2023-10-14 ENCOUNTER — Ambulatory Visit: Payer: Managed Care, Other (non HMO)

## 2023-10-15 ENCOUNTER — Encounter: Payer: Managed Care, Other (non HMO) | Admitting: *Deleted

## 2023-10-15 DIAGNOSIS — I272 Pulmonary hypertension, unspecified: Secondary | ICD-10-CM

## 2023-10-15 NOTE — Progress Notes (Signed)
Daily Session Note  Patient Details  Name: [name] MRN: [id number] Date of Birth: 04-01-1976 Referring Provider:   Flowsheet Row Pulmonary Rehab from 09/10/2023 in [facility] Cardiac and Pulmonary Rehab  Referring Provider Dr. Arvilla Meres       Encounter Date: 10/15/2023  Check In:  Session Check In - 10/15/23 1109       Check-In   Supervising physician immediately available to respond to emergencies See telemetry face sheet for immediately available ER MD    Location [facility]-Cardiac & Pulmonary Rehab    Staff Present Cora Collum, RN, BSN, CCRP;Jason Wallace Cullens, RDN, LDN;Meredith Jewel Baize, RN BSN;Maxon Conetta BS, , Exercise Physiologist    Virtual Visit No    Medication changes reported     No    Fall or balance concerns reported    No    Warm-up and Cool-down Performed on first and last piece of equipment    Resistance Training Performed Yes    VAD Patient? No    PAD/SET Patient? No      Pain Assessment   Currently in Pain? No/denies                Social History   Tobacco Use  Smoking Status Former   Current packs/day: 0.00   Types: Cigarettes   Quit date: 10/21/2019   Years since quitting: 3.9  Smokeless Tobacco Never    Goals Met:  Proper associated with RPD/PD & O2 Sat Independence with exercise equipment Exercise tolerated well No report of concerns or symptoms today  Goals Unmet:  Not Applicable  Comments: Pt able to follow exercise prescription today without complaint.  Will continue to monitor for progression.    Dr. Bethann Punches is Medical Director for [facility] Cardiac Rehabilitation.  Dr. Vida Rigger is Medical Director for [facility] Pulmonary Rehabilitation.

## 2023-10-16 ENCOUNTER — Other Ambulatory Visit: Payer: Self-pay | Admitting: Cardiology

## 2023-10-17 ENCOUNTER — Encounter: Payer: Managed Care, Other (non HMO) | Admitting: *Deleted

## 2023-10-17 DIAGNOSIS — I272 Pulmonary hypertension, unspecified: Secondary | ICD-10-CM

## 2023-10-17 NOTE — Progress Notes (Signed)
Daily Session Note  Patient Details  Name: [name] MRN: [id number] Date of Birth: Jan 30, 1976 Referring Provider:   Flowsheet Row Pulmonary Rehab from 09/10/2023 in [facility] Cardiac and Pulmonary Rehab  Referring Provider Dr. Arvilla Meres       Encounter Date: 10/17/2023  Check In:  Session Check In - 10/17/23 1105       Check-In   Supervising physician immediately available to respond to emergencies See telemetry face sheet for immediately available ER MD    Location [facility]-Cardiac & Pulmonary Rehab    Staff Present Ronette Deter, BS, Exercise Physiologist;Meredith Jewel Baize, RN BSN;Maxon Conetta BS, , Exercise Physiologist;Adda Stokes Katrinka Blazing, RN, ADN    Virtual Visit No    Medication changes reported     No    Fall or balance concerns reported    No    Warm-up and Cool-down Performed on first and last piece of equipment    Resistance Training Performed Yes    VAD Patient? No    PAD/SET Patient? No      Pain Assessment   Currently in Pain? No/denies                Social History   Tobacco Use  Smoking Status Former   Current packs/day: 0.00   Types: Cigarettes   Quit date: 10/21/2019   Years since quitting: 3.9  Smokeless Tobacco Never    Goals Met:  Independence with exercise equipment Exercise tolerated well No report of concerns or symptoms today Strength training completed today  Goals Unmet:  Not Applicable  Comments: Pt able to follow exercise prescription today without complaint.  Will continue to monitor for progression.    Dr. Bethann Punches is Medical Director for [facility] Cardiac Rehabilitation.  Dr. Vida Rigger is Medical Director for [facility] Pulmonary Rehabilitation.

## 2023-10-21 ENCOUNTER — Ambulatory Visit: Payer: Managed Care, Other (non HMO)

## 2023-10-22 ENCOUNTER — Ambulatory Visit (INDEPENDENT_AMBULATORY_CARE_PROVIDER_SITE_OTHER): Payer: Managed Care, Other (non HMO)

## 2023-10-22 ENCOUNTER — Encounter: Payer: Managed Care, Other (non HMO) | Admitting: *Deleted

## 2023-10-22 DIAGNOSIS — Z87891 Personal history of nicotine dependence: Secondary | ICD-10-CM | POA: Insufficient documentation

## 2023-10-22 DIAGNOSIS — R0609 Other forms of dyspnea: Secondary | ICD-10-CM | POA: Insufficient documentation

## 2023-10-22 DIAGNOSIS — J449 Chronic obstructive pulmonary disease, unspecified: Secondary | ICD-10-CM

## 2023-10-22 DIAGNOSIS — I272 Pulmonary hypertension, unspecified: Secondary | ICD-10-CM | POA: Diagnosis not present

## 2023-10-22 LAB — PULMONARY FUNCTION TEST ARMC ONLY
DL/VA % pred: 84 %
DL/VA: 3.67 ml/min/mmHg/L
DLCO unc % pred: 78 %
DLCO unc: 15.99 ml/min/mmHg
FEF 25-75 Post: 1.7 L/s
FEF 25-75 Pre: 1.36 L/s
FEF2575-%Change-Post: 24 %
FEF2575-%Pred-Post: 62 %
FEF2575-%Pred-Pre: 49 %
FEV1-%Change-Post: 5 %
FEV1-%Pred-Post: 69 %
FEV1-%Pred-Pre: 65 %
FEV1-Post: 1.89 L
FEV1-Pre: 1.78 L
FEV1FVC-%Change-Post: -1 %
FEV1FVC-%Pred-Pre: 90 %
FEV6-%Change-Post: 6 %
FEV6-%Pred-Post: 77 %
FEV6-%Pred-Pre: 73 %
FEV6-Post: 2.6 L
FEV6-Pre: 2.45 L
FEV6FVC-%Change-Post: 0 %
FEV6FVC-%Pred-Post: 102 %
FEV6FVC-%Pred-Pre: 102 %
FVC-%Change-Post: 7 %
FVC-%Pred-Post: 76 %
FVC-%Pred-Pre: 71 %
FVC-Post: 2.63 L
FVC-Pre: 2.45 L
Post FEV1/FVC ratio: 72 %
Post FEV6/FVC ratio: 100 %
Pre FEV1/FVC ratio: 73 %
Pre FEV6/FVC Ratio: 100 %
RV % pred: 123 %
RV: 2.13 L
TLC % pred: 102 %
TLC: 5.03 L

## 2023-10-22 MED ORDER — ALBUTEROL SULFATE (2.5 MG/3ML) 0.083% IN NEBU
2.5000 mg | INHALATION_SOLUTION | Freq: Once | RESPIRATORY_TRACT | Status: AC
Start: 2023-10-22 — End: 2023-10-22
  Administered 2023-10-22: 2.5 mg via RESPIRATORY_TRACT
  Filled 2023-10-22: qty 3

## 2023-10-22 NOTE — Progress Notes (Signed)
Daily Session Note  Patient Details  Name: ZUHA GRIECO MRN: [id number] Date of Birth: [birth year] Referring Provider:   Flowsheet Row Pulmonary Rehab from 09/10/2023 in [facility] Cardiac and Pulmonary Rehab  Referring Provider Dr. Arvilla Meres       Encounter Date: 10/22/2023  Check In:  Session Check In - 10/22/23 1042       Check-In   Supervising physician immediately available to respond to emergencies See telemetry face sheet for immediately available ER MD    Location [facility]-Cardiac & Pulmonary Rehab    Staff Present Rory Percy, MS, Exercise Physiologist;Jovaughn Wojtaszek, RN, BSN, CCRP;Noah Tickle, BS, Exercise Physiologist;Maxon Conetta BS, , Exercise Physiologist    Virtual Visit No    Medication changes reported     No    Fall or balance concerns reported    No    Warm-up and Cool-down Performed on first and last piece of equipment    Resistance Training Performed Yes    VAD Patient? No    PAD/SET Patient? No      Pain Assessment   Currently in Pain? No/denies                Social History   Tobacco Use  Smoking Status Former   Current packs/day: 0.00   Types: Cigarettes   Quit date: 10/21/2019   Years since quitting: 4.0  Smokeless Tobacco Never    Goals Met:  Proper associated with RPD/PD & O2 Sat Independence with exercise equipment Exercise tolerated well No report of concerns or symptoms today  Goals Unmet:  Not Applicable  Comments: Pt able to follow exercise prescription today without complaint.  Will continue to monitor for progression.    Dr. Bethann Punches is Medical Director for [facility] Cardiac Rehabilitation.  Dr. Vida Rigger is Medical Director for [facility] Pulmonary Rehabilitation.

## 2023-10-23 ENCOUNTER — Encounter: Payer: Self-pay | Admitting: Pulmonary Disease

## 2023-10-23 ENCOUNTER — Ambulatory Visit (INDEPENDENT_AMBULATORY_CARE_PROVIDER_SITE_OTHER): Payer: Managed Care, Other (non HMO) | Admitting: Pulmonary Disease

## 2023-10-23 ENCOUNTER — Encounter: Payer: Self-pay | Admitting: *Deleted

## 2023-10-23 VITALS — BP 126/78 | HR 83 | Temp 98.1°F | Ht 63.0 in | Wt 163.0 lb

## 2023-10-23 DIAGNOSIS — R058 Other specified cough: Secondary | ICD-10-CM | POA: Diagnosis not present

## 2023-10-23 DIAGNOSIS — I272 Pulmonary hypertension, unspecified: Secondary | ICD-10-CM

## 2023-10-23 DIAGNOSIS — J42 Unspecified chronic bronchitis: Secondary | ICD-10-CM | POA: Diagnosis not present

## 2023-10-23 LAB — NITRIC OXIDE: Nitric Oxide: 26

## 2023-10-23 MED ORDER — FLUTICASONE PROPIONATE 50 MCG/ACT NA SUSP: 1.0000 | Freq: Every day | NASAL | 2 refills | Status: AC

## 2023-10-23 MED ORDER — BREZTRI AEROSPHERE 160-9-4.8 MCG/ACT IN AERO: 2.0000 | INHALATION_SPRAY | Freq: Two times a day (BID) | RESPIRATORY_TRACT | 0 refills | Status: DC

## 2023-10-23 MED ORDER — BREZTRI AEROSPHERE 160-9-4.8 MCG/ACT IN AERO: 2.0000 | INHALATION_SPRAY | Freq: Two times a day (BID) | RESPIRATORY_TRACT | 3 refills | Status: DC

## 2023-10-23 NOTE — Progress Notes (Signed)
Synopsis: Referred in by Larena Glassman, PA   Subjective:   PATIENT ID: [name] GENDER: female DOB: 27-Nov-1975, MRN: [id number]  Chief Complaint  Patient presents with   Follow-up    PFT 12/17-wheezing, chest congestion, non prod cough and some SOB.    HPI [name] is a pleasant 47 year old female patient with no significant past medical history presenting today to the pulmonary clinic to establish care regarding her pulmonary hypertension and obstructive lung disease seen on PFTs.  She underwent hysterectomy on 07/18/2023 and complicated by postop hypoxic respiratory failure and near syncope.  She subsequently underwent an echocardiogram that showed an EF of 55 to 60% with grade 2 diastolic dysfunction and moderately enlarged right ventricle with PASP of 83 mmHg and moderate to severe TR.  Her CTA chest did not show any pulmonary emboli.  Her BNP at the time was 470.  She was diuresed gently and started and discharged to follow-up with cardiology.  In the interim she underwent right heart cath (09/16)  RA 4 PA 71/27 (41) PCW 4 Fick 3.6/2.1 TD 3.2/1.8 PVR10.2 TPG 37 DPG 23 .   VQ scan 09/17 with slight heterogenous pulmonary perfusion but no wedge-shaped perfusion defects to suggest PE.   Sleep study with mild OSA AHI 9.6 unable to tolerated CPAP.   ANA negative, ANCA negative, anti-SCL 70 negative, C3 elevated at 193, C4 within normal range, rheumatoid factor negative, ESR 8 and CRP 1.0 borderline.  Anti-CCP 6.  HIV negative on 09/12.  CTA chest 09/12 did not show any signs of interstitial lung disease.  PFTs 10/17 FEV1 decreased at 57% of predicted 1.56 L, FVC 2.44 L 70% of predicted with FEV1 FVC ratio 64. No significant response to bronchodilators. DLCO mildly reduced.   She is currently on tadalafil 40 mg by mouth daily (transitioned from Sildenafil 08/14/2023 - Sildenafil 40mg  TID 09/16 - 10/09).  Macitentan 10 mg 1 tablet daily (09/30).  Digoxin 0.0625 mg 1 tab daily and  dapagliflozin 1 tablet by mouth daily.  Repeat BNP 171.7 09/23   6 min walk test 11/05 - 1520 feet with O2 sat nadir 89% on room air.  Doing well overall, however had worsening rhinosinusitis for the past week along with chest congestion. She does report a history of chronic sinusitis. Also reported, hoarsness of the voice with Trelegy and was wondering if we can switch from a powder form.   Repeat PFTs with improved obstructive defect, possible response to bronchodilators, air trapping and improved DLCO.   Family history - Denies any family history of pulmonary disease   Social history - Ex smoker quit in 2020 smoked 1/2 PPD for 25 years, was vaping until July 2024.   ROS All systems were reviewed and are negative except for the above.  Objective:   Vitals:   10/23/23 1324  BP: 126/78  Pulse: 83  Temp: 98.1 F (36.7 C)  TempSrc: Temporal  SpO2: 96%  Weight: 163 lb (73.9 kg)  Height: 5\' 3"  (1.6 m)   96% on RA BMI Readings from Last 3 Encounters:  10/23/23 28.87 kg/m  10/08/23 29.12 kg/m  09/12/23 29.33 kg/m   Wt Readings from Last 3 Encounters:  10/23/23 163 lb (73.9 kg)  10/08/23 167 lb (75.8 kg)  09/12/23 168 lb 3.2 oz (76.3 kg)    Physical Exam GEN: NAD, Healthy Appearing HEENT: Supple Neck, Reactive Pupils, EOMI  CVS: Normal S1, Normal S2, RRR, No murmurs or ES appreciated  Lungs: Ronchi appreciated.  Abdomen: Soft, non tender, non distended, + BS  Extremities: Warm and well perfused, No edema  Skin: No suspicious lesions appreciated  Psych: Normal Affect  Labs and imaging were reviewed.  Ancillary Information   CBC    Component Value Date/Time   WBC 9.6 07/23/2023 0539   RBC 4.46 07/23/2023 0539   HGB 13.6 07/23/2023 0539   HGB 12.9 07/30/2013 2034   HCT 40.7 07/23/2023 0539   HCT 38.3 07/30/2013 2034   PLT 326 07/23/2023 0539   PLT 443 (H) 07/30/2013 2034   MCV 91.3 07/23/2023 0539   MCV 91 07/30/2013 2034   MCH 30.5 07/23/2023 0539   MCHC  33.4 07/23/2023 0539   RDW 13.8 07/23/2023 0539   RDW 12.9 07/30/2013 2034   LYMPHSABS 3.4 07/23/2023 0539   MONOABS 0.9 07/23/2023 0539   EOSABS 0.5 07/23/2023 0539   BASOSABS 0.1 07/23/2023 0539       Latest Ref Rng & Units 10/22/2023    9:28 AM 08/22/2023   10:23 AM  PFT Results  FVC-Pre L 2.45  P 2.44   FVC-Predicted Pre % 71  P 70   FVC-Post L 2.63  P 2.57   FVC-Predicted Post % 76  P 74   Pre FEV1/FVC % % 73  P 64   Post FEV1/FCV % % 72  P 64   FEV1-Pre L 1.78  P 1.56   FEV1-Predicted Pre % 65  P 57   FEV1-Post L 1.89  P 1.66   DLCO uncorrected ml/min/mmHg 15.99  P 12.81   DLCO UNC% % 78  P 62   DLVA Predicted % 84  P 75   TLC L 5.03  P   TLC % Predicted % 102  P   RV % Predicted % 123  P     P Preliminary result     Assessment & Plan:  [name] is a pleasant 47 year old female patient with no significant past medical history presenting today to the pulmonary clinic to establish care regarding her pulmonary hypertension and obstructive lung disease seen on PFTs.  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 WHO gp II to III - Intermediate risk per ERS. #C/b RV Systolic dysfunction (Followed by Cardiology Dr. Clarise Cruz)   Autoimmune panel has been negative so far per above. Will add myomarker panel. HIV negative. Mild OSA with AHI 9.6. VQ scan negative. PFTs with obstructive lung disease and moderately reduced DLCO. CT chest without parenchymal lung disease.     Appears majorly to be Group 1 pulmonary arterial hypertension with some component of group 3 PA 71/27 (41) PCW 4 PVR 10.2 complicated by RV failure seen on Echocardiogram 09/13 with moderately reduced RV systolic function and moderately enlarged RV with CI 1.8 TD. bnp 09/13 470 coming down nicely 170 09/23. Above all patient is feeling better.   []  Currently on Tadalafil 40 mg PO daily and Macitentan 10mg  PO daily  []  Followed by cardiology and  plan to repeat echocardiogram and possible RHC to decide and triple therapy. []  On Digoxin 0.0625mg  daily and Fraxiga 10mg  daily.   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 Still using her resuce inhaler daily. Unable to tolerate powder for inhaler. I will switch to [employer]. Should she continue to require daily rescue inhaler in 3 months, might consider biologics.  EOS 500  FENO 26 PFTs with improved obstructive defect, air trapping and improved DLCO.   []   Switch to Budesonide-Formoterol-Glycopyrrolate [Breztri] 160-4.5 2 puffs BID.  []   C/w Albuterol 2puffs Q6H as needed []  Advised on airway clearance regimen with Flutter valve  []  Flonase 1spray each nostril BID.  []  Allergen panel and total IgE. Will consider biologics pending response to [facility].   #OSA  Mild with AHI 9.6. Unable to tolerate CPAP. Advised to discuss oral appliance with Dr. Mayford Knife.  Return in about 3 months (around 01/21/2024).  I spent 50 minutes caring for this patient today, including preparing to see the patient, obtaining a medical history , reviewing a separately obtained history, performing a medically appropriate examination and/or evaluation, counseling and educating the patient/family/caregiver, ordering medications, tests, or procedures, referring and communicating with other health care professionals (not separately reported), documenting clinical information in the electronic health record, and independently interpreting results (not separately reported/billed) and communicating results to the patient/family/caregiver  Janann Colonel, MD [place] [facility] 10/23/2023 2:26 PM

## 2023-10-23 NOTE — Progress Notes (Signed)
Pulmonary Individual Treatment Plan  Patient Details  Name: [name] MRN: [id number] Date of Birth: 01-Jul-1976 Referring Provider:   Flowsheet Row Pulmonary Rehab from 09/10/2023 in [facility] Cardiac and Pulmonary Rehab  Referring Provider Dr. Arvilla Meres       Initial Encounter Date:  Flowsheet Row Pulmonary Rehab from 09/10/2023 in [facility] Cardiac and Pulmonary Rehab  Date 09/10/23       Visit Diagnosis: Pulmonary hypertension (HCC)  Patient's Home Medications on Admission:  Current Outpatient Medications:    dapagliflozin propanediol (FARXIGA) 10 MG TABS tablet, Take 1 tablet (10 mg total) by mouth daily., Disp: 30 tablet, Rfl: 11   digoxin (LANOXIN) 0.125 MG tablet, Take 0.5 tablets (0.0625 mg total) by mouth daily., Disp: 30 tablet, Rfl: 1   Fluticasone-Umeclidin-Vilant (TRELEGY ELLIPTA) 200-62.5-25 MCG/ACT AEPB, Inhale 1 puff into the lungs daily., Disp: 3 each, Rfl: 3   macitentan (OPSUMIT) 10 MG tablet, Take 1 tablet (10 mg total) by mouth daily., Disp: , Rfl:    rosuvastatin (CRESTOR) 10 MG tablet, TAKE 1 TABLET BY MOUTH EVERY DAY, Disp: 30 tablet, Rfl: 1   tadalafil, PAH, (ADCIRCA) 20 MG tablet, Take 2 tablets (40 mg total) by mouth daily., Disp: 60 tablet, Rfl: 11   tirzepatide (MOUNJARO) 2.5 MG/0.5ML Pen, Inject 2.5 mg into the skin once a week., Disp: 2 mL, Rfl: 1   VENTOLIN HFA 108 (90 Base) MCG/ACT inhaler, Inhale 1-2 puffs into the lungs every 4 (four) hours as needed for wheezing., Disp: , Rfl:   Past Medical History: Past Medical History:  Diagnosis Date   Abnormal thyroid function test    Anemia    Anxiety    Bronchitis 06/2023   finished prednisone and currently taking Amoxicillin as of 06-12-23   Cervical lymphadenopathy    Chronic back pain    Ectopic pregnancy    Headache    MIGRAINES   History of kidney stones    HSV infection    Lumbar radiculopathy    Menorrhagia    Palpitations    Pneumonia    06/2023   PONV (postoperative nausea and  vomiting)    DURING KIDNEY STONE REMOVAL   Seizures (HCC) 2014   one occurence-due to Tramadol   UTI (urinary tract infection)     Tobacco Use: Social History   Tobacco Use  Smoking Status Former   Current packs/day: 0.00   Types: Cigarettes   Quit date: 10/21/2019   Years since quitting: 4.0  Smokeless Tobacco Never    Labs: Review Flowsheet  More data exists      Latest Ref Rng & Units 04/20/2009 03/01/2021 07/18/2023 07/19/2023 07/22/2023  Labs for ITP Cardiac and Pulmonary Rehab  Cholestrol 0 - 200 mg/dL - - 829  - -  LDL (calc) 0 - 99 mg/dL - - 562  - -  HDL-C >13 mg/dL - - 41  - -  Trlycerides <150 mg/dL - - 79  - -  Hemoglobin A1c 4.8 - 5.6 % - 5.8  - 5.7  -  PH, Arterial 7.35 - 7.45 - - - - 7.459   PCO2 arterial 32 - 48 mmHg - - - - 32.1   Bicarbonate 20.0 - 28.0 mmol/L - - 21.2  - 28.6  28.5  22.7   TCO2 22 - 32 mmol/L 21  - - - 30  30  24    Acid-base deficit 0.0 - 2.0 mmol/L - - 3.1  - -  O2 Saturation % - - 75.1  -  59  57  95     Details       Multiple values from one day are sorted in reverse-chronological order          Pulmonary Assessment Scores:  Pulmonary Assessment Scores     Row Name 09/10/23 1507 09/12/23 1349       ADL UCSD   ADL Phase -- Entry    SOB Score total -- 5    Rest -- 0    Walk -- 0    Stairs -- 2    Bath -- 1    Dress -- 1    Shop -- 1      CAT Score   CAT Score -- 12      mMRC Score   mMRC Score 1 --             UCSD: Self-administered rating of dyspnea associated with activities of daily living (ADLs) 6-point scale (0 = "not at all" to 5 = "maximal or unable to do because of breathlessness")  Scoring Scores range from 0 to 120.  Minimally important difference is 5 units  CAT: CAT can identify the health impairment of COPD patients and is better correlated with disease progression.  CAT has a scoring range of zero to 40. The CAT score is classified into four groups of low (less than 10), medium (10 - 20),  high (21-30) and very high (31-40) based on the impact level of disease on health status. A CAT score over 10 suggests significant symptoms.  A worsening CAT score could be explained by an exacerbation, poor medication adherence, poor inhaler technique, or progression of COPD or comorbid conditions.  CAT MCID is 2 points  mMRC: mMRC (Modified Medical Research Council) Dyspnea Scale is used to assess the degree of baseline functional disability in patients of respiratory disease due to dyspnea. No minimal important difference is established. A decrease in score of 1 point or greater is considered a positive change.   Pulmonary Function Assessment:   Exercise Target Goals: Exercise Program Goal: Individual exercise prescription set using results from initial 6 min walk test and THRR while considering  patient's activity barriers and safety.   Exercise Prescription Goal: Initial exercise prescription builds to 30-45 minutes a day of aerobic activity, 2-3 days per week.  Home exercise guidelines will be given to patient during program as part of exercise prescription that the participant will acknowledge.  Education: Aerobic Exercise: - Group verbal and visual presentation on the components of exercise prescription. Introduces F.I.T.T principle from ACSM for exercise prescriptions.  Reviews F.I.T.T. principles of aerobic exercise including progression. Written material given at graduation.   Education: Resistance Exercise: - Group verbal and visual presentation on the components of exercise prescription. Introduces F.I.T.T principle from ACSM for exercise prescriptions  Reviews F.I.T.T. principles of resistance exercise including progression. Written material given at graduation.    Education: Exercise & Equipment Safety: - Individual verbal instruction and demonstration of equipment use and safety with use of the equipment. Flowsheet Row Pulmonary Rehab from 09/10/2023 in [facility] Cardiac and  Pulmonary Rehab  Date 09/10/23  Educator [facility]  Instruction Review Code 1- Verbalizes Understanding       Education: Exercise Physiology & General Exercise Guidelines: - Group verbal and written instruction with models to review the exercise physiology of the cardiovascular system and associated critical values. Provides general exercise guidelines with specific guidelines to those with heart or lung disease.    Education: Flexibility, Balance, Mind/Body Relaxation: -  Group verbal and visual presentation with interactive activity on the components of exercise prescription. Introduces F.I.T.T principle from ACSM for exercise prescriptions. Reviews F.I.T.T. principles of flexibility and balance exercise training including progression. Also discusses the mind body connection.  Reviews various relaxation techniques to help reduce and manage stress (i.e. Deep breathing, progressive muscle relaxation, and visualization). Balance handout provided to take home. Written material given at graduation.   Activity Barriers & Risk Stratification:  Activity Barriers & Cardiac Risk Stratification - 09/10/23 1456       Activity Barriers & Cardiac Risk Stratification   Activity Barriers None             6 Minute Walk:  6 Minute Walk     Row Name 09/10/23 1453         6 Minute Walk   Phase Initial     Distance 1520 feet     Walk Time 6 minutes     MPH 2.9     METS 4.6     RPE 10     Perceived Dyspnea  1     VO2 Peak 16     Symptoms No     Resting HR 86 bpm     Resting BP 124/70     Resting Oxygen Saturation  95 %     Exercise Oxygen Saturation  during 6 min walk 89 %     Max Ex. HR 123 bpm     Max Ex. BP 134/70     2 Minute Post BP 118/70       Interval HR   1 Minute HR 112     2 Minute HR 118     3 Minute HR 118     4 Minute HR 122     5 Minute HR 123     6 Minute HR 103     2 Minute Post HR 89     Interval Heart Rate? Yes       Interval Oxygen   Interval Oxygen? Yes      Baseline Oxygen Saturation % 95 %     1 Minute Oxygen Saturation % 89 %     1 Minute Liters of Oxygen 0 L     2 Minute Oxygen Saturation % 90 %     2 Minute Liters of Oxygen 0 L     3 Minute Oxygen Saturation % 90 %     3 Minute Liters of Oxygen 0 L     4 Minute Oxygen Saturation % 89 %     4 Minute Liters of Oxygen 0 L     5 Minute Oxygen Saturation % 89 %     5 Minute Liters of Oxygen 0 L     6 Minute Oxygen Saturation % 93 %     6 Minute Liters of Oxygen 0 L     2 Minute Post Oxygen Saturation % 94 %     2 Minute Post Liters of Oxygen 0 L             Oxygen Initial Assessment:  Oxygen Initial Assessment - 09/10/23 1501       Home Oxygen   Home Oxygen Device None    Sleep Oxygen Prescription None    Home Exercise Oxygen Prescription None    Home Resting Oxygen Prescription None    Compliance with Home Oxygen Use Yes      Initial 6 min Walk   Oxygen Used None  Program Oxygen Prescription   Program Oxygen Prescription None      Intervention   Short Term Goals To learn and demonstrate proper pursed lip breathing techniques or other breathing techniques.     Long  Term Goals Exhibits proper breathing techniques, such as pursed lip breathing or other method taught during program session             Oxygen Re-Evaluation:  Oxygen Re-Evaluation     Row Name 09/12/23 1109 10/22/23 0958           Program Oxygen Prescription   Program Oxygen Prescription -- None        Home Oxygen   Home Oxygen Device -- None      Sleep Oxygen Prescription -- None      Home Exercise Oxygen Prescription -- None      Home Resting Oxygen Prescription -- None      Compliance with Home Oxygen Use -- Yes        Goals/Expected Outcomes   Short Term Goals -- To learn and demonstrate proper pursed lip breathing techniques or other breathing techniques.       Long  Term Goals -- Exhibits proper breathing techniques, such as pursed lip breathing or other method taught during  program session      Comments Reviewed PLB technique with pt.  Talked about how it works and it's importance in maintaining their exercise saturations. We reviewed PLB with Ger today. She voiced understanding and reports that she will practice her PLB technique more during her ADL's. She also states that she does not currently own a pulse oximeter. We encouraged her to obtain one and she stated that she will look into it. We also talked to her about keeping her oxygen saturations above 88%.      Goals/Expected Outcomes Short: Become more profiecient at using PLB. Long: Become independent at using PLB. Short: Become more profiecient at using PLB. Long: Become independent at using PLB.               Oxygen Discharge (Final Oxygen Re-Evaluation):  Oxygen Re-Evaluation - 10/22/23 0958       Program Oxygen Prescription   Program Oxygen Prescription None      Home Oxygen   Home Oxygen Device None    Sleep Oxygen Prescription None    Home Exercise Oxygen Prescription None    Home Resting Oxygen Prescription None    Compliance with Home Oxygen Use Yes      Goals/Expected Outcomes   Short Term Goals To learn and demonstrate proper pursed lip breathing techniques or other breathing techniques.     Long  Term Goals Exhibits proper breathing techniques, such as pursed lip breathing or other method taught during program session    Comments We reviewed PLB with Maidie today. She voiced understanding and reports that she will practice her PLB technique more during her ADL's. She also states that she does not currently own a pulse oximeter. We encouraged her to obtain one and she stated that she will look into it. We also talked to her about keeping her oxygen saturations above 88%.    Goals/Expected Outcomes Short: Become more profiecient at using PLB. Long: Become independent at using PLB.             Initial Exercise Prescription:  Initial Exercise Prescription - 09/10/23 1500       NuStep    Level 3    SPM 80    Minutes 15  METs 4.6      Recumbant Elliptical   Level 3    RPM 50    Minutes 15    METs 4.6             Perform Capillary Blood Glucose checks as needed.  Exercise Prescription Changes:   Exercise Prescription Changes     Row Name 09/10/23 1400 09/26/23 1600 10/07/23 1000         Response to Exercise   Blood Pressure (Admit) 124/70 122/62 110/70     Blood Pressure (Exercise) 134/70 138/70 134/70     Blood Pressure (Exit) 118/70 110/60 122/62     Heart Rate (Admit) 86 bpm 85 bpm 90 bpm     Heart Rate (Exercise) 123 bpm 154 bpm 145 bpm     Heart Rate (Exit) 89 bpm 109 bpm 112 bpm     Oxygen Saturation (Admit) 95 % 92 % 90 %     Oxygen Saturation (Exercise) 89 % 92 % 87 %     Oxygen Saturation (Exit) 94 % 94 % 92 %     Rating of Perceived Exertion (Exercise) 10 12 13      Perceived Dyspnea (Exercise) 1 2 3      Symptoms none none none     Comments results firts two weeks of exercise --     Duration Progress to 30 minutes of  aerobic without signs/symptoms of physical distress Progress to 30 minutes of  aerobic without signs/symptoms of physical distress Progress to 30 minutes of  aerobic without signs/symptoms of physical distress     Intensity THRR New THRR New THRR New       Progression   Progression Continue to progress workloads to maintain intensity without signs/symptoms of physical distress. Continue to progress workloads to maintain intensity without signs/symptoms of physical distress. Continue to progress workloads to maintain intensity without signs/symptoms of physical distress.     Average METs 4.6 4.09 4.6       Resistance Training   Training Prescription -- Yes Yes     Weight -- 4 4     Reps -- 10-15 10-15       Interval Training   Interval Training -- No No       Treadmill   MPH -- 3.2 3.2     Grade -- 3.5 3.5     Minutes -- 15 15     METs -- 4.99 4.99       NuStep   Level -- -- 3     Minutes -- -- 15        Recumbant Elliptical   Level -- 3 --     Minutes -- 15 --       Elliptical   Level -- 1 3.2     Speed -- 3.3 3.5     Minutes -- 15 15       Oxygen   Maintain Oxygen Saturation -- 88% or higher 88% or higher              Exercise Comments:   Exercise Comments     Row Name 09/12/23 1108           Exercise Comments First full day of exercise!  Patient was oriented to gym and equipment including functions, settings, policies, and procedures.  Patient's individual exercise prescription and treatment plan were reviewed.  All starting workloads were established based on the results of the 6 minute walk test done at initial orientation visit.  The plan for exercise progression was also introduced and progression will be customized based on patient's performance and goals.                Exercise Goals and Review:   Exercise Goals     Row Name 09/10/23 1500             Exercise Goals   Increase Physical Activity Yes       Intervention Develop an individualized exercise prescription for aerobic and resistive training based on initial evaluation findings, risk stratification, comorbidities and participant's personal goals.;Provide advice, education, support and counseling about physical activity/exercise needs.       Expected Outcomes Long Term: Exercising regularly at least 3-5 days a week.;Long Term: Add in home exercise to make exercise part of routine and to increase amount of physical activity.;Short Term: Attend rehab on a regular basis to increase amount of physical activity.       Increase Strength and Stamina Yes       Intervention Provide advice, education, support and counseling about physical activity/exercise needs.;Develop an individualized exercise prescription for aerobic and resistive training based on initial evaluation findings, risk stratification, comorbidities and participant's personal goals.       Expected Outcomes Long Term: Improve cardiorespiratory  fitness, muscular endurance and strength as measured by increased METs and functional capacity ( );Short Term: Perform resistance training exercises routinely during rehab and add in resistance training at home;Short Term: Increase workloads from initial exercise prescription for resistance, speed, and METs.       Able to understand and use rate of perceived exertion (RPE) scale Yes       Intervention Provide education and explanation on how to use RPE scale       Expected Outcomes Long Term:  Able to use RPE to guide intensity level when exercising independently;Short Term: Able to use RPE daily in rehab to express subjective intensity level       Able to understand and use Dyspnea scale Yes       Intervention Provide education and explanation on how to use Dyspnea scale       Expected Outcomes Long Term: Able to use Dyspnea scale to guide intensity level when exercising independently;Short Term: Able to use Dyspnea scale daily in rehab to express subjective sense of shortness of breath during exertion       Knowledge and understanding of Target Heart Rate Range (THRR) Yes       Intervention Provide education and explanation of THRR including how the numbers were predicted and where they are located for reference       Expected Outcomes Long Term: Able to use THRR to govern intensity when exercising independently;Short Term: Able to use daily as guideline for intensity in rehab;Short Term: Able to state/look up THRR       Able to check pulse independently Yes       Intervention Review the importance of being able to check your own pulse for safety during independent exercise;Provide education and demonstration on how to check pulse in carotid and radial arteries.       Expected Outcomes Long Term: Able to check pulse independently and accurately;Short Term: Able to explain why pulse checking is important during independent exercise       Understanding of Exercise Prescription Yes       Intervention  Provide education, explanation, and written materials on patient's individual exercise prescription       Expected Outcomes Long Term: Able to  explain home exercise prescription to exercise independently;Short Term: Able to explain program exercise prescription                Exercise Goals Re-Evaluation :  Exercise Goals Re-Evaluation     Row Name 09/12/23 1108 09/26/23 1632 10/07/23 1054         Exercise Goal Re-Evaluation   Exercise Goals Review Able to understand and use rate of perceived exertion (RPE) scale;Able to understand and use Dyspnea scale;Knowledge and understanding of Target Heart Rate Range (THRR);Understanding of Exercise Prescription Increase Physical Activity;Increase Strength and Stamina;Understanding of Exercise Prescription Increase Physical Activity;Increase Strength and Stamina;Understanding of Exercise Prescription     Comments Reviewed RPE and dyspnea scale, THR and program prescription with pt today.  Pt voiced understanding and was given a copy of goals to take home. Zenita is off to a good start in the program. She has only attended 3 sessions since her initial orientation to the program. In those 3 sessions she was able to increase her treadmill workload up to a speed of 3.2 mph and an incline of 3.5%. We will continue to monitor her progress in the program. Janis continues to do well in the program. She has been able to maintain her workload on the treadmill at a speed of 3. and a grade of 3.5%. She has also been able to increase her level on the elliptical from level 1 to level 3. We will continue to monitor her progress in the program.     Expected Outcomes Short: Use RPE daily to regulate intensity. Long: Follow program prescription in THR. Short: Continue to follow current exercise prescription. Long: Continue exercise to improve strength and stamina. Short: Continue to follow current exercise prescription. Long: Continue exercise to improve strength and stamina.               Discharge Exercise Prescription (Final Exercise Prescription Changes):  Exercise Prescription Changes - 10/07/23 1000       Response to Exercise   Blood Pressure (Admit) 110/70    Blood Pressure (Exercise) 134/70    Blood Pressure (Exit) 122/62    Heart Rate (Admit) 90 bpm    Heart Rate (Exercise) 145 bpm    Heart Rate (Exit) 112 bpm    Oxygen Saturation (Admit) 90 %    Oxygen Saturation (Exercise) 87 %    Oxygen Saturation (Exit) 92 %    Rating of Perceived Exertion (Exercise) 13    Perceived Dyspnea (Exercise) 3    Symptoms none    Duration Progress to 30 minutes of  aerobic without signs/symptoms of physical distress    Intensity THRR New      Progression   Progression Continue to progress workloads to maintain intensity without signs/symptoms of physical distress.    Average METs 4.6      Resistance Training   Training Prescription Yes    Weight 4    Reps 10-15      Interval Training   Interval Training No      Treadmill   MPH 3.2    Grade 3.5    Minutes 15    METs 4.99      NuStep   Level 3    Minutes 15      Elliptical   Level 3.2    Speed 3.5    Minutes 15      Oxygen   Maintain Oxygen Saturation 88% or higher  Nutrition:  Target Goals: Understanding of nutrition guidelines, daily intake of sodium 1500mg , cholesterol 200mg , calories 30% from fat and 7% or less from saturated fats, daily to have 5 or more servings of fruits and vegetables.  Education: All About Nutrition: -Group instruction provided by verbal, written material, interactive activities, discussions, models, and posters to present general guidelines for heart healthy nutrition including fat, fiber, MyPlate, the role of sodium in heart healthy nutrition, utilization of the nutrition label, and utilization of this knowledge for meal planning. Follow up email sent as well. Written material given at graduation.   Biometrics:  Pre Biometrics - 09/10/23  1459       Pre Biometrics   Height 5' 3.9" (1.623 m)    Weight 166 lb 11.2 oz (75.6 kg)    Waist Circumference 37.5 inches    Hip Circumference 42.5 inches    Waist to Hip Ratio 0.88 %    BMI (Calculated) 28.71    Single Leg Stand 9.53 seconds              Nutrition Therapy Plan and Nutrition Goals:   Nutrition Assessments:  MEDIFICTS Score Key: >=70 Need to make dietary changes  40-70 Heart Healthy Diet <= 40 Therapeutic Level Cholesterol Diet  Flowsheet Row Pulmonary Rehab from 09/10/2023 in [facility] Cardiac and Pulmonary Rehab  Picture Your Plate Total Score on Admission 49      Picture Your Plate Scores: <46 Unhealthy dietary pattern with much room for improvement. 41-50 Dietary pattern unlikely to meet recommendations for good health and room for improvement. 51-60 More healthful dietary pattern, with some room for improvement.  >60 Healthy dietary pattern, although there may be some specific behaviors that could be improved.   Nutrition Goals Re-Evaluation:  Nutrition Goals Re-Evaluation     Row Name 10/22/23 [phone]             Goals   Nutrition Goal Amethyst states that she feels she is doing well with her diet at this time. She states that she is a little overwhemed with her number of appointments, but would be open to meeting with the RD in the future.       Comment Short: Meet with RD in future. Long: Continue to make healthy dietary choices.                Nutrition Goals Discharge (Final Nutrition Goals Re-Evaluation):  Nutrition Goals Re-Evaluation - 10/22/23 0951       Goals   Nutrition Goal Angel states that she feels she is doing well with her diet at this time. She states that she is a little overwhemed with her number of appointments, but would be open to meeting with the RD in the future.    Comment Short: Meet with RD in future. Long: Continue to make healthy dietary choices.             Psychosocial: Target Goals: Acknowledge presence  or absence of significant depression and/or stress, maximize coping skills, provide positive support system. Participant is able to verbalize types and ability to use techniques and skills needed for reducing stress and depression.   Education: Stress, Anxiety, and Depression - Group verbal and visual presentation to define topics covered.  Reviews how body is impacted by stress, anxiety, and depression.  Also discusses healthy ways to reduce stress and to treat/manage anxiety and depression.  Written material given at graduation.   Education: Sleep Hygiene -Provides group verbal and written instruction about how sleep can  affect your health.  Define sleep hygiene, discuss sleep cycles and impact of sleep habits. Review good sleep hygiene tips.    Initial Review & Psychosocial Screening:  Initial Psych Review & Screening - 09/05/23 1012       Family Dynamics   Good Support System? Yes   Mom and Dad,     Barriers   Psychosocial barriers to participate in program There are no identifiable barriers or psychosocial needs.      Screening Interventions   Interventions To provide support and resources with identified psychosocial needs;Provide feedback about the scores to participant;Encouraged to exercise    Expected Outcomes Short Term goal: Utilizing psychosocial counselor, staff and physician to assist with identification of specific Stressors or current issues interfering with healing process. Setting desired goal for each stressor or current issue identified.;Long Term Goal: Stressors or current issues are controlled or eliminated.;Short Term goal: Identification and review with participant of any Quality of Life or Depression concerns found by scoring the questionnaire.;Long Term goal: The participant improves quality of Life and PHQ9 Scores as seen by post scores and/or verbalization of changes             Quality of Life Scores:  Scores of 19 and below usually indicate a poorer  quality of life in these areas.  A difference of  2-3 points is a clinically meaningful difference.  A difference of 2-3 points in the total score of the Quality of Life Index has been associated with significant improvement in overall quality of life, self-image, physical symptoms, and general health in studies assessing change in quality of life.  PHQ-9: Review Flowsheet       09/10/2023  Depression screen PHQ 2/9  Decreased Interest 0  Down, Depressed, Hopeless 0  PHQ - 2 Score 0  Altered sleeping 1  Tired, decreased energy 1  Change in appetite 0  Feeling bad or failure about yourself  0  Trouble concentrating 0  Moving slowly or fidgety/restless 0  Suicidal thoughts 0  PHQ-9 Score 2  Difficult doing work/chores Not difficult at all   Interpretation of Total Score  Total Score Depression Severity:  1-4 = Minimal depression, 5-9 = Mild depression, 10-14 = Moderate depression, 15-19 = Moderately severe depression, 20-27 = Severe depression   Psychosocial Evaluation and Intervention:  Psychosocial Evaluation - 09/05/23 1026       Psychosocial Evaluation & Interventions   Interventions Encouraged to exercise with the program and follow exercise prescription    Comments Masiel has no barriers to attending the program.  She did have anxiety about exercise after her HR went up to 155 when she got up to move in the hospital. TAlking with her physician ,she feels less anxiety being sent to the PR program and being in a clinical place to exercise. She has a new diagnosis of PUlmonary hypertension and is working with her doctors to do all they can to keep it controlled, and to understand how to manage with the disease.  She has support from her Mom and Dad. She lives with her 2 children.  She is ready to get started with the program.    Expected Outcomes STG attends all scheduled sessions, continues to work on reducing any anxiety about exercising with Pul HTN. LTG  Continues exercise  progression after discharge, continue to work with her physician for any anxiety control    Continue Psychosocial Services  Follow up required by staff  Psychosocial Re-Evaluation:  Psychosocial Re-Evaluation     Row Name 10/22/23 [phone]             Psychosocial Re-Evaluation   Comments Alechia reports no major stressors at this time. She enjoys scrolling on tik tok for stress relief. She also likes to volunteer at her church when they need her help. She has a good support system made up by her parents, family, and church. She states that she has been sleeping better since rehab but is still waking up some throughout the night.       Expected Outcomes Short: Continue to attend pulmonary rehab for stress relief. Long: Continue to maintain positive outlook.       Interventions Encouraged to attend Pulmonary Rehabilitation for the exercise       Continue Psychosocial Services  Follow up required by staff                Psychosocial Discharge (Final Psychosocial Re-Evaluation):  Psychosocial Re-Evaluation - 10/22/23 0948       Psychosocial Re-Evaluation   Comments Tyrone reports no major stressors at this time. She enjoys scrolling on tik tok for stress relief. She also likes to volunteer at her church when they need her help. She has a good support system made up by her parents, family, and church. She states that she has been sleeping better since rehab but is still waking up some throughout the night.    Expected Outcomes Short: Continue to attend pulmonary rehab for stress relief. Long: Continue to maintain positive outlook.    Interventions Encouraged to attend Pulmonary Rehabilitation for the exercise    Continue Psychosocial Services  Follow up required by staff             Education: Education Goals: Education classes will be provided on a weekly basis, covering required topics. Participant will state understanding/return demonstration of topics  presented.  Learning Barriers/Preferences:  Learning Barriers/Preferences - 09/05/23 1013       Learning Barriers/Preferences   Learning Barriers None    Learning Preferences None             General Pulmonary Education Topics:  Infection Prevention: - Provides verbal and written material to individual with discussion of infection control including proper hand washing and proper equipment cleaning during exercise session. Flowsheet Row Pulmonary Rehab from 09/10/2023 in [facility] Cardiac and Pulmonary Rehab  Date 09/10/23  Educator [facility]  Instruction Review Code 1- Verbalizes Understanding       Falls Prevention: - Provides verbal and written material to individual with discussion of falls prevention and safety. Flowsheet Row Pulmonary Rehab from 09/10/2023 in [facility] Cardiac and Pulmonary Rehab  Date 09/10/23  Educator [facility]  Instruction Review Code 1- Verbalizes Understanding       Chronic Lung Disease Review: - Group verbal instruction with posters, models, PowerPoint presentations and videos,  to review new updates, new respiratory medications, new advancements in procedures and treatments. Providing information on websites and "800" numbers for continued self-education. Includes information about supplement oxygen, available portable oxygen systems, continuous and intermittent flow rates, oxygen safety, concentrators, and Medicare reimbursement for oxygen. Explanation of Pulmonary Drugs, including class, frequency, complications, importance of spacers, rinsing mouth after steroid MDI's, and proper cleaning methods for nebulizers. Review of basic lung anatomy and physiology related to function, structure, and complications of lung disease. Review of risk factors. Discussion about methods for diagnosing sleep apnea and types of masks and machines for OSA. Includes a review of the use  of types of environmental controls: home humidity, furnaces, filters, dust mite/pet prevention, HEPA  vacuums. Discussion about weather changes, air quality and the benefits of nasal washing. Instruction on Warning signs, infection symptoms, calling MD promptly, preventive modes, and value of vaccinations. Review of effective airway clearance, coughing and/or vibration techniques. Emphasizing that all should Create an Action Plan. Written material given at graduation.   AED/CPR: - Group verbal and written instruction with the use of models to demonstrate the basic use of the AED with the basic ABC's of resuscitation.    Anatomy and Cardiac Procedures: - Group verbal and visual presentation and models provide information about basic cardiac anatomy and function. Reviews the testing methods done to diagnose heart disease and the outcomes of the test results. Describes the treatment choices: Medical Management, Angioplasty, or Coronary Bypass Surgery for treating various heart conditions including Myocardial Infarction, Angina, Valve Disease, and Cardiac Arrhythmias.  Written material given at graduation.   Medication Safety: - Group verbal and visual instruction to review commonly prescribed medications for heart and lung disease. Reviews the medication, class of the drug, and side effects. Includes the steps to properly store meds and maintain the prescription regimen.  Written material given at graduation.   Other: -Provides group and verbal instruction on various topics (see comments)   Knowledge Questionnaire Score:    Core Components/Risk Factors/Patient Goals at Admission:  Personal Goals and Risk Factors at Admission - 09/05/23 1016       Core Components/Risk Factors/Patient Goals on Admission    Weight Management Yes    Intervention Weight Management: Develop a combined nutrition and exercise program designed to reach desired caloric intake, while maintaining appropriate intake of nutrient and fiber, sodium and fats, and appropriate energy expenditure required for the weight  goal.;Weight Management: Provide education and appropriate resources to help participant work on and attain dietary goals.    Admit Weight 164 lb (74.4 kg)   has lost 7 lbs   Goal Weight: Short Term 163 lb (73.9 kg)    Goal Weight: Long Term 135 lb (61.2 kg)    Expected Outcomes Short Term: Continue to assess and modify interventions until short term weight is achieved;Long Term: Adherence to nutrition and physical activity/exercise program aimed toward attainment of established weight goal;Weight Loss: Understanding of general recommendations for a balanced deficit meal plan, which promotes 1-2 lb weight loss per week and includes a negative energy balance of [phone] kcal/d    Improve shortness of breath with ADL's Yes    Intervention Provide education, individualized exercise plan and daily activity instruction to help decrease symptoms of SOB with activities of daily living.    Expected Outcomes Short Term: Improve cardiorespiratory fitness to achieve a reduction of symptoms when performing ADLs;Long Term: Be able to perform more ADLs without symptoms or delay the onset of symptoms    Heart Failure Yes    Intervention Provide a combined exercise and nutrition program that is supplemented with education, support and counseling about heart failure. Directed toward relieving symptoms such as shortness of breath, decreased exercise tolerance, and extremity edema.    Expected Outcomes Improve functional capacity of life;Short term: Attendance in program 2-3 days a week with increased exercise capacity. Reported lower sodium intake. Reported increased fruit and vegetable intake. Reports medication compliance.;Short term: Daily weights obtained and reported for increase. Utilizing diuretic protocols set by physician.;Long term: Adoption of self-care skills and reduction of barriers for early signs and symptoms recognition and intervention leading to self-care maintenance.  Lipids Yes    Intervention  Provide education and support for participant on nutrition & aerobic/resistive exercise along with prescribed medications to achieve LDL 70mg , HDL >40mg .    Expected Outcomes Short Term: Participant states understanding of desired cholesterol values and is compliant with medications prescribed. Participant is following exercise prescription and nutrition guidelines.;Long Term: Cholesterol controlled with medications as prescribed, with individualized exercise RX and with personalized nutrition plan. Value goals: LDL < 70mg , HDL > 40 mg.             Education:Diabetes - Individual verbal and written instruction to review signs/symptoms of diabetes, desired ranges of glucose level fasting, after meals and with exercise. Acknowledge that pre and post exercise glucose checks will be done for 3 sessions at entry of program.   Know Your Numbers and Heart Failure: - Group verbal and visual instruction to discuss disease risk factors for cardiac and pulmonary disease and treatment options.  Reviews associated critical values for Overweight/Obesity, Hypertension, Cholesterol, and Diabetes.  Discusses basics of heart failure: signs/symptoms and treatments.  Introduces Heart Failure Zone chart for action plan for heart failure.  Written material given at graduation.   Core Components/Risk Factors/Patient Goals Review:   Goals and Risk Factor Review     Row Name 10/22/23 [phone]             Core Components/Risk Factors/Patient Goals Review   Personal Goals Review Weight Management/Obesity;Improve shortness of breath with ADL's;Hypertension       Review Shanedra states that she is doing well with maintaining her weight around 160 lbs. She would like to lose a little weight with a weight goal of 135 lb. She states that she does own a BP cuff at home and is checking it occasionally. She reports that her BP has stayed within normal ranges. Prarthana states that her SOB has not improved too much lately. However, we  revied PLB techniques and encouraged her to practice PLB during both exercise and during ADL's.       Expected Outcomes Short: Continue to work towards weight goal through diet and exercise. Long: Continue to monitor lifestyle risk factors.                Core Components/Risk Factors/Patient Goals at Discharge (Final Review):   Goals and Risk Factor Review - 10/22/23 0952       Core Components/Risk Factors/Patient Goals Review   Personal Goals Review Weight Management/Obesity;Improve shortness of breath with ADL's;Hypertension    Review Renata states that she is doing well with maintaining her weight around 160 lbs. She would like to lose a little weight with a weight goal of 135 lb. She states that she does own a BP cuff at home and is checking it occasionally. She reports that her BP has stayed within normal ranges. Jagger states that her SOB has not improved too much lately. However, we revied PLB techniques and encouraged her to practice PLB during both exercise and during ADL's.    Expected Outcomes Short: Continue to work towards weight goal through diet and exercise. Long: Continue to monitor lifestyle risk factors.             ITP Comments:  ITP Comments     Row Name 09/05/23 1033 09/10/23 1453 09/12/23 1107 09/25/23 1007 10/23/23 1114   ITP Comments Virtual orientation call completed today. shehas an appointment on Date: 09/10/2023  for EP eval and gym Orientation.  Documentation of diagnosis [facility] be found in [facility] Date: 08/30/2023 .  Completed and gym orientation. Initial ITP created and sent for review to Dr. Jinny Sanders, Medical Director. First full day of exercise!  Patient was oriented to gym and equipment including functions, settings, policies, and procedures.  Patient's individual exercise prescription and treatment plan were reviewed.  All starting workloads were established based on the results of the 6 minute walk test done at initial orientation visit.  The plan for  exercise progression was also introduced and progression will be customized based on patient's performance and goals. 30 Day review completed. Medical Director ITP review done, changes made as directed, and signed approval by Medical Director.    new to program 30 Day review completed. Medical Director ITP review done, changes made as directed, and signed approval by Medical Director.            Comments:

## 2023-10-24 ENCOUNTER — Other Ambulatory Visit
Admission: RE | Admit: 2023-10-24 | Discharge: 2023-10-24 | Disposition: A | Discharge status: Home / Self Care | Payer: Managed Care, Other (non HMO) | Source: Home / Self Care | Attending: Pulmonary Disease | Admitting: Pulmonary Disease

## 2023-10-24 ENCOUNTER — Ambulatory Visit: Payer: Managed Care, Other (non HMO) | Admitting: Pulmonary Disease

## 2023-10-24 ENCOUNTER — Encounter: Payer: Managed Care, Other (non HMO) | Admitting: *Deleted

## 2023-10-24 DIAGNOSIS — J42 Unspecified chronic bronchitis: Secondary | ICD-10-CM | POA: Insufficient documentation

## 2023-10-24 DIAGNOSIS — I272 Pulmonary hypertension, unspecified: Secondary | ICD-10-CM

## 2023-10-24 NOTE — Progress Notes (Signed)
Daily Session Note  Patient Details  Name: [name] MRN: [id number] Date of Birth: [birth year] Referring Provider:   Flowsheet Row Pulmonary Rehab from 09/10/2023 in [facility] Cardiac and Pulmonary Rehab  Referring Provider Dr. Arvilla Meres       Encounter Date: 10/24/2023  Check In:  Session Check In - 10/24/23 1140       Check-In   Supervising physician immediately available to respond to emergencies See telemetry face sheet for immediately available ER MD    Location [facility]-Cardiac & Pulmonary Rehab    Staff Present Ronette Deter, BS, Exercise Physiologist;Meredith Jewel Baize, RN BSN;Joseph Shelbie Proctor, RN, [place]    Virtual Visit No    Medication changes reported     No    Fall or balance concerns reported    No    Warm-up and Cool-down Performed on first and last piece of equipment    Resistance Training Performed Yes    VAD Patient? No    PAD/SET Patient? No      Pain Assessment   Currently in Pain? No/denies                Social History   Tobacco Use  Smoking Status Former   Current packs/day: 0.00   Types: Cigarettes   Quit date: 10/21/2019   Years since quitting: 4.0  Smokeless Tobacco Never    Goals Met:  Independence with exercise equipment Exercise tolerated well No report of concerns or symptoms today Strength training completed today  Goals Unmet:  Not Applicable  Comments: Pt able to follow exercise prescription today without complaint.  Will continue to monitor for progression.    Dr. Bethann Punches is Medical Director for [facility] Cardiac Rehabilitation.  Dr. Vida Rigger is Medical Director for [facility] Pulmonary Rehabilitation.

## 2023-10-24 NOTE — Addendum Note (Signed)
Addended by: Lonell Face C on: 10/24/2023 12:13 PM   Modules accepted: Orders

## 2023-10-27 LAB — ALLERGEN PANEL (27) + IGE
Alternaria Alternata IgE: <0.1 kU/L
Aspergillus Fumigatus IgE: <0.1 kU/L
Bahia Grass IgE: 0.11 kU/L — AB
Bermuda Grass IgE: <0.1 kU/L
Cat Dander IgE: 5.37 kU/L — AB
Cedar, Mountain IgE: 0.4 kU/L — AB
Cladosporium Herbarum IgE: <0.1 kU/L
Cocklebur IgE: 0.4 kU/L — AB
Cockroach, American IgE: <0.1 kU/L
Common Silver Birch IgE: <0.1 kU/L
D Farinae IgE: 5.98 kU/L — AB
D Pteronyssinus IgE: 8.37 kU/L — AB
Dog Dander IgE: 0.93 kU/L — AB
Elm, American IgE: <0.1 kU/L
Hickory, White IgE: 0.13 kU/L — AB
IgE (Immunoglobulin E), Serum: 151 [IU]/mL (ref 6–495)
Johnson Grass IgE: <0.1 kU/L
Kentucky Bluegrass IgE: 0.9 kU/L — AB
Maple/Box Elder IgE: <0.1 kU/L
Mucor Racemosus IgE: <0.1 kU/L
Oak, White IgE: <0.1 kU/L
Penicillium Chrysogen IgE: <0.1 kU/L
Pigweed, Rough IgE: <0.1 kU/L
Plantain, English IgE: <0.1 kU/L
Ragweed, Short IgE: 2.02 kU/L — AB
Setomelanomma Rostrat: <0.1 kU/L
Timothy Grass IgE: 0.79 kU/L — AB
White Mulberry IgE: <0.1 kU/L

## 2023-10-28 ENCOUNTER — Ambulatory Visit: Payer: Managed Care, Other (non HMO)

## 2023-10-29 ENCOUNTER — Telehealth: Payer: Self-pay | Admitting: Pharmacist

## 2023-10-29 ENCOUNTER — Encounter: Payer: Managed Care, Other (non HMO) | Admitting: *Deleted

## 2023-10-29 DIAGNOSIS — I272 Pulmonary hypertension, unspecified: Secondary | ICD-10-CM | POA: Diagnosis not present

## 2023-10-29 MED ORDER — MOUNJARO 5 MG/0.5ML ~~LOC~~ SOAJ: 5.0000 mg | SUBCUTANEOUS | 0 refills | Status: DC

## 2023-10-29 NOTE — Progress Notes (Signed)
Daily Session Note  Patient Details  Name: [name] MRN: [id number] Date of Birth: June 19, 1976 Referring Provider:   Flowsheet Row Pulmonary Rehab from 09/10/2023 in [facility] Cardiac and Pulmonary Rehab  Referring Provider Dr. Arvilla Meres       Encounter Date: 10/29/2023  Check In:  Session Check In - 10/29/23 1123       Check-In   Supervising physician immediately available to respond to emergencies See telemetry face sheet for immediately available ER MD    Location [facility]-Cardiac & Pulmonary Rehab    Staff Present Cora Collum, RN, BSN, CCRP;Margaret Best, MS, Exercise Physiologist;Kristen Coble, RN,BC,MSN;Jason Wallace Cullens, RDN, Luiz Iron, BS, Exercise Physiologist    Virtual Visit No    Medication changes reported     No    Fall or balance concerns reported    No    Warm-up and Cool-down Performed on first and last piece of equipment    Resistance Training Performed Yes    VAD Patient? No    PAD/SET Patient? No      Pain Assessment   Currently in Pain? No/denies                Social History   Tobacco Use  Smoking Status Former   Current packs/day: 0.00   Types: Cigarettes   Quit date: 10/21/2019   Years since quitting: 4.0  Smokeless Tobacco Never    Goals Met:  Proper associated with RPD/PD & O2 Sat Independence with exercise equipment Exercise tolerated well No report of concerns or symptoms today  Goals Unmet:  Not Applicable  Comments: Pt able to follow exercise prescription today without complaint.  Will continue to monitor for progression. Reviewed home exercise with pt today from 11:20am to 11:30am. Pt plans add 3 additional days and do online cardio and strength classes, such as mat pilates and cardio dance, and try the wellzone for exercise. Reviewed THR, pulse, RPE, sign and symptoms, pulse oximetery and when to call 911 or MD. Also discussed weather considerations and indoor options. Pt voiced understanding.     Dr. Bethann Punches is  Medical Director for [facility] Cardiac Rehabilitation.  Dr. Vida Rigger is Medical Director for [facility] Pulmonary Rehabilitation.

## 2023-10-29 NOTE — Telephone Encounter (Signed)
Patient is tolerating Mounjaro 2.5 mg dose well with no issues. Will send in Mounjaro 5 mg weekly.

## 2023-10-31 ENCOUNTER — Encounter: Payer: Managed Care, Other (non HMO) | Admitting: *Deleted

## 2023-10-31 DIAGNOSIS — I272 Pulmonary hypertension, unspecified: Secondary | ICD-10-CM

## 2023-10-31 NOTE — Progress Notes (Signed)
Daily Session Note  Patient Details  Name: GWENDOLYNE COPPA MRN: [id number] Date of Birth: 16-Mar-1976 Referring Provider:   Flowsheet Row Pulmonary Rehab from 09/10/2023 in [facility] Cardiac and Pulmonary Rehab  Referring Provider Dr. Arvilla Meres       Encounter Date: 10/31/2023  Check In:  Session Check In - 10/31/23 1104       Check-In   Supervising physician immediately available to respond to emergencies See telemetry face sheet for immediately available ER MD    Location [facility]-Cardiac & Pulmonary Rehab    Staff Present Cora Collum, RN, BSN, CCRP;Joseph [place], RCP,RRT,BSRT;Meredith [place], RN Atilano Median, RN, ADN    Virtual Visit No    Medication changes reported     No    Fall or balance concerns reported    No    Warm-up and Cool-down Performed on first and last piece of equipment    Resistance Training Performed Yes    VAD Patient? No    PAD/SET Patient? No      Pain Assessment   Currently in Pain? No/denies                Social History   Tobacco Use  Smoking Status Former   Current packs/day: 0.00   Types: Cigarettes   Quit date: 10/21/2019   Years since quitting: 4.0  Smokeless Tobacco Never    Goals Met:  Independence with exercise equipment Exercise tolerated well No report of concerns or symptoms today Strength training completed today  Goals Unmet:  Not Applicable  Comments: Pt able to follow exercise prescription today without complaint.  Will continue to monitor for progression.    Dr. Bethann Punches is Medical Director for [facility] Cardiac Rehabilitation.  Dr. Vida Rigger is Medical Director for [facility] Pulmonary Rehabilitation.

## 2023-11-04 ENCOUNTER — Ambulatory Visit: Payer: Managed Care, Other (non HMO)

## 2023-11-05 ENCOUNTER — Encounter: Payer: Managed Care, Other (non HMO) | Admitting: *Deleted

## 2023-11-05 DIAGNOSIS — I272 Pulmonary hypertension, unspecified: Secondary | ICD-10-CM | POA: Diagnosis not present

## 2023-11-05 NOTE — Progress Notes (Signed)
 Daily Session Note  Patient Details  Name: [name] MRN: [id number] Date of Birth: 06-13-1976 Referring Provider:   Flowsheet Row Pulmonary Rehab from 09/10/2023 in [facility] Cardiac and Pulmonary Rehab  Referring Provider Dr. Toribio Fuel       Encounter Date: 11/05/2023  Check In:  Session Check In - 11/05/23 1111       Check-In   Supervising physician immediately available to respond to emergencies See telemetry face sheet for immediately available ER MD    Location [facility]-Cardiac & Pulmonary Rehab    Staff Present Othel Durand, RN, BSN, CCRP;Jason Elnor, RDN, LDN;Maxon Conetta BS, , Exercise Physiologist    Virtual Visit No    Medication changes reported     No    Fall or balance concerns reported    No    Warm-up and Cool-down Performed on first and last piece of equipment    Resistance Training Performed Yes    VAD Patient? No    PAD/SET Patient? No      Pain Assessment   Currently in Pain? No/denies                Social History   Tobacco Use  Smoking Status Former   Current packs/day: 0.00   Types: Cigarettes   Quit date: 10/21/2019   Years since quitting: 4.0  Smokeless Tobacco Never    Goals Met:  Proper associated with RPD/PD & O2 Sat Independence with exercise equipment Exercise tolerated well No report of concerns or symptoms today  Goals Unmet:  Not Applicable  Comments: Pt able to follow exercise prescription today without complaint.  Will continue to monitor for progression.    Dr. Oneil Pinal is Medical Director for [facility] Cardiac Rehabilitation.  Dr. Fuad Aleskerov is Medical Director for [facility] Pulmonary Rehabilitation.

## 2023-11-07 ENCOUNTER — Encounter: Payer: Managed Care, Other (non HMO) | Attending: Internal Medicine | Admitting: *Deleted

## 2023-11-07 DIAGNOSIS — I272 Pulmonary hypertension, unspecified: Secondary | ICD-10-CM | POA: Insufficient documentation

## 2023-11-07 NOTE — Progress Notes (Signed)
 Daily Session Note  Patient Details  Name: [name] MRN: [id number] Date of Birth: 01/30/1976 Referring Provider:   Flowsheet Row Pulmonary Rehab from 09/10/2023 in [facility] Cardiac and Pulmonary Rehab  Referring Provider Dr. Toribio Fuel       Encounter Date: 11/07/2023  Check In:  Session Check In - 11/07/23 1111       Check-In   Supervising physician immediately available to respond to emergencies See telemetry face sheet for immediately available ER MD    Location [facility]-Cardiac & Pulmonary Rehab    Staff Present Rollene Paterson, MS, Exercise Physiologist;Meredith Tressa, RN BSN;Maxon Conetta BS, , Exercise Physiologist;Riyanshi Wahab Claudene, RN, ADN    Virtual Visit No    Medication changes reported     Yes    Comments started Mounjaro  5 mg    Fall or balance concerns reported    No    Warm-up and Cool-down Performed on first and last piece of equipment    Resistance Training Performed Yes    VAD Patient? No    PAD/SET Patient? No      Pain Assessment   Currently in Pain? No/denies                Social History   Tobacco Use  Smoking Status Former   Current packs/day: 0.00   Types: Cigarettes   Quit date: 10/21/2019   Years since quitting: 4.0  Smokeless Tobacco Never    Goals Met:  Independence with exercise equipment Exercise tolerated well No report of concerns or symptoms today Strength training completed today  Goals Unmet:  Not Applicable  Comments: Pt able to follow exercise prescription today without complaint.  Will continue to monitor for progression.    Dr. Oneil Pinal is Medical Director for [facility] Cardiac Rehabilitation.  Dr. Fuad Aleskerov is Medical Director for [facility] Pulmonary Rehabilitation.

## 2023-11-09 ENCOUNTER — Other Ambulatory Visit: Payer: Self-pay | Admitting: Cardiology

## 2023-11-11 ENCOUNTER — Ambulatory Visit: Payer: Managed Care, Other (non HMO)

## 2023-11-12 ENCOUNTER — Encounter: Payer: Managed Care, Other (non HMO) | Admitting: *Deleted

## 2023-11-12 DIAGNOSIS — I272 Pulmonary hypertension, unspecified: Secondary | ICD-10-CM

## 2023-11-12 NOTE — Progress Notes (Signed)
 Daily Session Note  Patient Details  Name: [name] MRN: [id number] Date of Birth: 12/07/1975 Referring Provider:   Flowsheet Row Pulmonary Rehab from 09/10/2023 in [facility] Cardiac and Pulmonary Rehab  Referring Provider Dr. Toribio Fuel       Encounter Date: 11/12/2023  Check In:  Session Check In - 11/12/23 1126       Check-In   Supervising physician immediately available to respond to emergencies See telemetry face sheet for immediately available ER MD    Location [facility]-Cardiac & Pulmonary Rehab    Staff Present Othel Durand, RN, BSN, CCRP;Meredith Tressa, RN BSN;Maxon Conetta BS, , Exercise Physiologist    Virtual Visit No    Medication changes reported     No    Fall or balance concerns reported    No    Warm-up and Cool-down Performed on first and last piece of equipment    Resistance Training Performed Yes    VAD Patient? No    PAD/SET Patient? No      Pain Assessment   Currently in Pain? No/denies                Social History   Tobacco Use  Smoking Status Former   Current packs/day: 0.00   Types: Cigarettes   Quit date: 10/21/2019   Years since quitting: 4.0  Smokeless Tobacco Never    Goals Met:  Proper associated with RPD/PD & O2 Sat Independence with exercise equipment Exercise tolerated well No report of concerns or symptoms today  Goals Unmet:  Not Applicable  Comments: Pt able to follow exercise prescription today without complaint.  Will continue to monitor for progression.    Dr. Oneil Pinal is Medical Director for [facility] Cardiac Rehabilitation.  Dr. Fuad Aleskerov is Medical Director for [facility] Pulmonary Rehabilitation.

## 2023-11-13 ENCOUNTER — Ambulatory Visit
Admission: RE | Admit: 2023-11-13 | Discharge: 2023-11-13 | Disposition: A | Discharge status: Home / Self Care | Payer: Managed Care, Other (non HMO) | Source: Ambulatory Visit | Attending: Internal Medicine | Admitting: Internal Medicine

## 2023-11-13 DIAGNOSIS — I272 Pulmonary hypertension, unspecified: Secondary | ICD-10-CM | POA: Diagnosis present

## 2023-11-13 DIAGNOSIS — F419 Anxiety disorder, unspecified: Secondary | ICD-10-CM | POA: Diagnosis not present

## 2023-11-13 DIAGNOSIS — R002 Palpitations: Secondary | ICD-10-CM | POA: Insufficient documentation

## 2023-11-13 DIAGNOSIS — I081 Rheumatic disorders of both mitral and tricuspid valves: Secondary | ICD-10-CM | POA: Insufficient documentation

## 2023-11-13 LAB — ECHOCARDIOGRAM COMPLETE
AR max vel: 2.45 cm2
AV Area VTI: 2.85 cm2
AV Area mean vel: 2.58 cm2
AV Mean grad: 2.5 mm[Hg]
AV Peak grad: 4.7 mm[Hg]
Ao pk vel: 1.09 m/s
Area-P 1/2: 4.1 cm2
MV VTI: 2.99 cm2
S' Lateral: 2.1 cm

## 2023-11-13 NOTE — Progress Notes (Signed)
*  PRELIMINARY RESULTS* Echocardiogram 2D Echocardiogram has been performed.  [name] 11/13/2023, 10:50 AM

## 2023-11-14 ENCOUNTER — Encounter: Payer: Managed Care, Other (non HMO) | Admitting: *Deleted

## 2023-11-14 DIAGNOSIS — I272 Pulmonary hypertension, unspecified: Secondary | ICD-10-CM

## 2023-11-14 NOTE — Progress Notes (Signed)
 Daily Session Note  Patient Details  Name: [name] MRN: [id number] Date of Birth: Dec 23, 1975 Referring Provider:   Flowsheet Row Pulmonary Rehab from 09/10/2023 in [facility] Cardiac and Pulmonary Rehab  Referring Provider Dr. Toribio Fuel       Encounter Date: 11/14/2023  Check In:  Session Check In - 11/14/23 1044       Check-In   Supervising physician immediately available to respond to emergencies See telemetry face sheet for immediately available ER MD    Location [facility]-Cardiac & Pulmonary Rehab    Staff Present Hoy Rodney, RN BSN;Joseph Rolinda, RCP,RRT,BSRT;Maxon [place] BS, , Exercise Physiologist;Keyshla Tunison Claudene, RN, [place]    Virtual Visit No    Medication changes reported     No    Fall or balance concerns reported    No    Warm-up and Cool-down Performed on first and last piece of equipment    Resistance Training Performed Yes    VAD Patient? No    PAD/SET Patient? No      Pain Assessment   Currently in Pain? No/denies                Social History   Tobacco Use  Smoking Status Former   Current packs/day: 0.00   Types: Cigarettes   Quit date: 10/21/2019   Years since quitting: 4.0  Smokeless Tobacco Never    Goals Met:  Independence with exercise equipment Exercise tolerated well No report of concerns or symptoms today Strength training completed today  Goals Unmet:  Not Applicable  Comments: Pt able to follow exercise prescription today without complaint.  Will continue to monitor for progression.    Dr. Oneil Pinal is Medical Director for [facility] Cardiac Rehabilitation.  Dr. Fuad Aleskerov is Medical Director for [facility] Pulmonary Rehabilitation.

## 2023-11-18 ENCOUNTER — Ambulatory Visit: Payer: Managed Care, Other (non HMO)

## 2023-11-19 ENCOUNTER — Encounter: Payer: Managed Care, Other (non HMO) | Admitting: *Deleted

## 2023-11-19 DIAGNOSIS — I272 Pulmonary hypertension, unspecified: Secondary | ICD-10-CM

## 2023-11-19 NOTE — Progress Notes (Signed)
 Daily Session Note  Patient Details  Name: DONOVAN GATCHEL MRN: [id number] Date of Birth: 24-Jan-1976 Referring Provider:   Flowsheet Row Pulmonary Rehab from 09/10/2023 in [facility] Cardiac and Pulmonary Rehab  Referring Provider Dr. Toribio Fuel       Encounter Date: 11/19/2023  Check In:  Session Check In - 11/19/23 1112       Check-In   Supervising physician immediately available to respond to emergencies See telemetry face sheet for immediately available ER MD    Location [facility]-Cardiac & Pulmonary Rehab    Staff Present Othel Durand, RN, BSN, CCRP;Meredith Tressa, RN BSN;Maxon Conetta BS, , Exercise Physiologist;Krista Firefighter, BSN    Virtual Visit No    Medication changes reported     No    Fall or balance concerns reported    No    Warm-up and Cool-down Performed on first and last piece of equipment    Resistance Training Performed Yes    VAD Patient? No    PAD/SET Patient? No      Pain Assessment   Currently in Pain? No/denies                Social History   Tobacco Use  Smoking Status Former   Current packs/day: 0.00   Types: Cigarettes   Quit date: 10/21/2019   Years since quitting: 4.0  Smokeless Tobacco Never    Goals Met:  Proper associated with RPD/PD & O2 Sat Independence with exercise equipment Exercise tolerated well No report of concerns or symptoms today  Goals Unmet:  Not Applicable  Comments: Pt able to follow exercise prescription today without complaint.  Will continue to monitor for progression.    Dr. Oneil Pinal is Medical Director for [facility] Cardiac Rehabilitation.  Dr. Fuad Aleskerov is Medical Director for [facility] Pulmonary Rehabilitation.

## 2023-11-20 DIAGNOSIS — I272 Pulmonary hypertension, unspecified: Secondary | ICD-10-CM

## 2023-11-20 NOTE — Progress Notes (Signed)
 Pulmonary Individual Treatment Plan  Patient Details  Name: [name] MRN: [id number] Date of Birth: 08-30-1976 Referring Provider:   Flowsheet Row Pulmonary Rehab from 09/10/2023 in [facility]  Referring Provider Dr. Jules Oar       Initial Encounter Date:  Flowsheet Row Pulmonary Rehab from 09/10/2023 in [facility]  Date 09/10/23       Visit Diagnosis: Pulmonary hypertension (HCC)  Patient's Home Medications on Admission:  Current Outpatient Medications:    Budeson-Glycopyrrol-Formoterol (BREZTRI  AEROSPHERE) 160-9-4.8 MCG/ACT AERO, Inhale 2 puffs into the lungs in the morning and at bedtime., Disp: 1 each, Rfl: 3   Budeson-Glycopyrrol-Formoterol (BREZTRI  AEROSPHERE) 160-9-4.8 MCG/ACT AERO, Inhale 2 puffs into the lungs in the morning and at bedtime., Disp: 11.8 g, Rfl: 0   dapagliflozin  propanediol (FARXIGA ) 10 MG TABS tablet, Take 1 tablet (10 mg total) by mouth daily., Disp: 30 tablet, Rfl: 11   digoxin  (LANOXIN ) 0.125 MG tablet, Take 0.5 tablets (0.0625 mg total) by mouth daily., Disp: 30 tablet, Rfl: 1   fluticasone  (FLONASE ) 50 MCG/ACT nasal spray, Place 1 spray into both nostrils daily., Disp: 100 mL, Rfl: 2   macitentan  (OPSUMIT ) 10 MG tablet, Take 1 tablet (10 mg total) by mouth daily., Disp: , Rfl:    rosuvastatin  (CRESTOR ) 10 MG tablet, TAKE 1 TABLET BY MOUTH EVERY DAY, Disp: 90 tablet, Rfl: 1   tadalafil , PAH, (ADCIRCA ) 20 MG tablet, Take 2 tablets (40 mg total) by mouth daily., Disp: 60 tablet, Rfl: 11   tirzepatide (MOUNJARO) 5 MG/0.5ML Pen, Inject 5 mg into the skin once a week., Disp: 2 mL, Rfl: 0   VENTOLIN  HFA 108 (90 Base) MCG/ACT inhaler, Inhale 1-2 puffs into the lungs every 4 (four) hours as needed for wheezing., Disp: , Rfl:   Past Medical History: Past Medical History:  Diagnosis Date   Abnormal thyroid  function test    Anemia    Anxiety    Bronchitis 06/2023   finished prednisone  and currently  taking Amoxicillin as of 06-12-23   Cervical lymphadenopathy    Chronic back pain    Ectopic pregnancy    Headache    MIGRAINES   History of kidney stones    HSV infection    Lumbar radiculopathy    Menorrhagia    Palpitations    Pneumonia    06/2023   PONV (postoperative nausea and vomiting)    DURING KIDNEY STONE REMOVAL   Seizures (HCC) 2014   one occurence-due to Tramadol   UTI (urinary tract infection)     Tobacco Use: Social History   Tobacco Use  Smoking Status Former   Current packs/day: 0.00   Types: Cigarettes   Quit date: 10/21/2019   Years since quitting: 4.0  Smokeless Tobacco Never    Labs: Review Flowsheet  More data exists      Latest Ref Rng & Units 04/20/2009 03/01/2021 07/18/2023 07/19/2023 07/22/2023  Labs for [facility] Cardiac and Pulmonary Rehab  Cholestrol 0 - 200 mg/dL - - 409  - -  LDL (calc) 0 - 99 mg/dL - - 811  - -  HDL-C >91 mg/dL - - 41  - -  Trlycerides <150 mg/dL - - 79  - -  Hemoglobin A1c 4.8 - 5.6 % - 5.8  - 5.7  -  PH, Arterial 7.35 - 7.45 - - - - 7.459   PCO2 arterial 32 - 48 mmHg - - - - 32.1   Bicarbonate 20.0 - 28.0 mmol/L - -  21.2  - 28.6  28.5  22.7   TCO2 22 - 32 mmol/L 21  - - - 30  30  24    Acid-base deficit 0.0 - 2.0 mmol/L - - 3.1  - -  O2 Saturation % - - 75.1  - 59  57  95     Details       Multiple values from one day are sorted in reverse-chronological order          Pulmonary Assessment Scores:  Pulmonary Assessment Scores     Row Name 09/10/23 1507 09/12/23 1349       ADL UCSD   ADL Phase -- Entry    SOB Score total -- 5    Rest -- 0    Walk -- 0    Stairs -- 2    Bath -- 1    Dress -- 1    Shop -- 1      CAT Score   CAT Score -- 12      mMRC Score   mMRC Score 1 --             UCSD: Self-administered rating of dyspnea associated with activities of daily living (ADLs) 6-point scale (0 = "not at all" to 5 = "maximal or unable to do because of breathlessness")  Scoring Scores range from 0  to 120.  Minimally important difference is 5 units  CAT: CAT can identify the health impairment of COPD patients and is better correlated with disease progression.  CAT has a scoring range of zero to 40. The CAT score is classified into four groups of low (less than 10), medium (10 - 20), high (21-30) and very high (31-40) based on the impact level of disease on health status. A CAT score over 10 suggests significant symptoms.  A worsening CAT score could be explained by an exacerbation, poor medication adherence, poor inhaler technique, or progression of COPD or comorbid conditions.  CAT MCID is 2 points  mMRC: mMRC (Modified Medical Research Council) Dyspnea Scale is used to assess the degree of baseline functional disability in patients of respiratory disease due to dyspnea. No minimal important difference is established. A decrease in score of 1 point or greater is considered a positive change.   Pulmonary Function Assessment:   Exercise Target Goals: Exercise Program Goal: Individual exercise prescription set using results from initial 6 min walk test and THRR while considering  patient's activity barriers and safety.   Exercise Prescription Goal: Initial exercise prescription builds to 30-45 minutes a day of aerobic activity, 2-3 days per week.  Home exercise guidelines will be given to patient during program as part of exercise prescription that the participant will acknowledge.  Education: Aerobic Exercise: - Group verbal and visual presentation on the components of exercise prescription. Introduces F.I.T.T principle from ACSM for exercise prescriptions.  Reviews F.I.T.T. principles of aerobic exercise including progression. Written material given at graduation.   Education: Resistance Exercise: - Group verbal and visual presentation on the components of exercise prescription. Introduces F.I.T.T principle from ACSM for exercise prescriptions  Reviews F.I.T.T. principles of  resistance exercise including progression. Written material given at graduation.    Education: Exercise & Equipment Safety: - Individual verbal instruction and demonstration of equipment use and safety with use of the equipment. Flowsheet Row Pulmonary Rehab from 09/10/2023 in [facility]  Date 09/10/23  Educator [facility]  Instruction Review Code 1- [employer] Understanding       Education: Exercise  Physiology & General Exercise Guidelines: - Group verbal and written instruction with models to review the exercise physiology of the cardiovascular system and associated critical values. Provides general exercise guidelines with specific guidelines to those with heart or lung disease.    Education: Flexibility, Balance, Mind/Body Relaxation: - Group verbal and visual presentation with interactive activity on the components of exercise prescription. Introduces F.I.T.T principle from ACSM for exercise prescriptions. Reviews F.I.T.T. principles of flexibility and balance exercise training including progression. Also discusses the mind body connection.  Reviews various relaxation techniques to help reduce and manage stress (i.e. Deep breathing, progressive muscle relaxation, and visualization). Balance handout provided to take home. Written material given at graduation.   Activity Barriers & Risk Stratification:  Activity Barriers & Cardiac Risk Stratification - 09/10/23 1456       Activity Barriers & Cardiac Risk Stratification   Activity Barriers None             6 Minute Walk:  6 Minute Walk     Row Name 09/10/23 1453         6 Minute Walk   Phase Initial     Distance 1520 feet     Walk Time 6 minutes     MPH 2.9     METS 4.6     RPE 10     Perceived Dyspnea  1     VO2 Peak 16     Symptoms No     Resting HR 86 bpm     Resting BP 124/70     Resting Oxygen Saturation  95 %     Exercise Oxygen Saturation  during 6 min walk 89 %     Max Ex. HR 123 bpm      Max Ex. BP 134/70     2 Minute Post BP 118/70       Interval HR   1 Minute HR 112     2 Minute HR 118     3 Minute HR 118     4 Minute HR 122     5 Minute HR 123     6 Minute HR 103     2 Minute Post HR 89     Interval Heart Rate? Yes       Interval Oxygen   Interval Oxygen? Yes     Baseline Oxygen Saturation % 95 %     1 Minute Oxygen Saturation % 89 %     1 Minute Liters of Oxygen 0 L     2 Minute Oxygen Saturation % 90 %     2 Minute Liters of Oxygen 0 L     3 Minute Oxygen Saturation % 90 %     3 Minute Liters of Oxygen 0 L     4 Minute Oxygen Saturation % 89 %     4 Minute Liters of Oxygen 0 L     5 Minute Oxygen Saturation % 89 %     5 Minute Liters of Oxygen 0 L     6 Minute Oxygen Saturation % 93 %     6 Minute Liters of Oxygen 0 L     2 Minute Post Oxygen Saturation % 94 %     2 Minute Post Liters of Oxygen 0 L             Oxygen Initial Assessment:  Oxygen Initial Assessment - 09/10/23 1501       Home Oxygen   Home Oxygen Device None  Sleep Oxygen Prescription None    Home Exercise Oxygen Prescription None    Home Resting Oxygen Prescription None    Compliance with Home Oxygen Use Yes      Initial 6 min Walk   Oxygen Used None      Program Oxygen Prescription   Program Oxygen Prescription None      Intervention   Short Term Goals To learn and demonstrate proper pursed lip breathing techniques or other breathing techniques.     Long  Term Goals Exhibits proper breathing techniques, such as pursed lip breathing or other method taught during program session             Oxygen Re-Evaluation:  Oxygen Re-Evaluation     Row Name 09/12/23 1109 10/22/23 0958 10/31/23 1101         Program Oxygen Prescription   Program Oxygen Prescription -- None None       Home Oxygen   Home Oxygen Device -- None None     Sleep Oxygen Prescription -- None None     Home Exercise Oxygen Prescription -- None None     Home Resting Oxygen Prescription -- None  None     Compliance with Home Oxygen Use -- Yes Yes       Goals/Expected Outcomes   Short Term Goals -- To learn and demonstrate proper pursed lip breathing techniques or other breathing techniques.  To learn and demonstrate proper pursed lip breathing techniques or other breathing techniques.      Long  Term Goals -- Exhibits proper breathing techniques, such as pursed lip breathing or other method taught during program session Exhibits proper breathing techniques, such as pursed lip breathing or other method taught during program session     Comments Reviewed PLB technique with pt.  Talked about how it works and it's importance in maintaining their exercise saturations. We reviewed PLB with Steph today. She voiced understanding and reports that she will practice her PLB technique more during her ADL's. She also states that she does not currently own a pulse oximeter. We encouraged her to obtain one and she stated that she will look into it. We also talked to her about keeping her oxygen saturations above 88%. Reviewed PLB with Isobelle today. She voiced understanding and says she would remember to use them during ADLs and exercise if needed. She still hasnt gotten a pulse ox for home but knows it would be helpful so today set goal to get one by 2025.     Goals/Expected Outcomes Short: Become more profiecient at using PLB. Long: Become independent at using PLB. Short: Become more profiecient at using PLB. Long: Become independent at using PLB. STG: Get a pulse ox for home by 2025. Become more profiecient at using PLB. Long: become independent at using PLB              Oxygen Discharge (Final Oxygen Re-Evaluation):  Oxygen Re-Evaluation - 10/31/23 1101       Program Oxygen Prescription   Program Oxygen Prescription None      Home Oxygen   Home Oxygen Device None    Sleep Oxygen Prescription None    Home Exercise Oxygen Prescription None    Home Resting Oxygen Prescription None    Compliance with  Home Oxygen Use Yes      Goals/Expected Outcomes   Short Term Goals To learn and demonstrate proper pursed lip breathing techniques or other breathing techniques.     Long  Term Goals Exhibits  proper breathing techniques, such as pursed lip breathing or other method taught during program session    Comments Reviewed PLB with Arryn today. She voiced understanding and says she would remember to use them during ADLs and exercise if needed. She still hasnt gotten a pulse ox for home but knows it would be helpful so today set goal to get one by 2025.    Goals/Expected Outcomes STG: Get a pulse ox for home by 2025. Become more profiecient at using PLB. Long: become independent at using PLB             Initial Exercise Prescription:  Initial Exercise Prescription - 09/10/23 1500       NuStep   Level 3    SPM 80    Minutes 15    METs 4.6      Recumbant Elliptical   Level 3    RPM 50    Minutes 15    METs 4.6             Perform Capillary Blood Glucose checks as needed.  Exercise Prescription Changes:   Exercise Prescription Changes     Row Name 09/10/23 1400 09/26/23 1600 10/07/23 1000 10/24/23 1600 10/29/23 1100     Response to Exercise   Blood Pressure (Admit) 124/70 122/62 110/70 120/62 --   Blood Pressure (Exercise) 134/70 138/70 134/70 128/68 --   Blood Pressure (Exit) 118/70 110/60 122/62 100/56 --   Heart Rate (Admit) 86 bpm 85 bpm 90 bpm 94 bpm --   Heart Rate (Exercise) 123 bpm 154 bpm 145 bpm 145 bpm --   Heart Rate (Exit) 89 bpm 109 bpm 112 bpm 117 bpm --   Oxygen Saturation (Admit) 95 % 92 % 90 % 90 % --   Oxygen Saturation (Exercise) 89 % 92 % 87 % 88 % --   Oxygen Saturation (Exit) 94 % 94 % 92 % 90 % --   Rating of Perceived Exertion (Exercise) 10 12 13 12  --   Perceived Dyspnea (Exercise) 1 2 3 2  --   Symptoms none none none none --   Comments results firts two weeks of exercise -- -- --   Duration Progress to 30 minutes of  aerobic without  signs/symptoms of physical distress Progress to 30 minutes of  aerobic without signs/symptoms of physical distress Progress to 30 minutes of  aerobic without signs/symptoms of physical distress Progress to 30 minutes of  aerobic without signs/symptoms of physical distress --   Intensity THRR New THRR New THRR New THRR New --     Progression   Progression Continue to progress workloads to maintain intensity without signs/symptoms of physical distress. Continue to progress workloads to maintain intensity without signs/symptoms of physical distress. Continue to progress workloads to maintain intensity without signs/symptoms of physical distress. Continue to progress workloads to maintain intensity without signs/symptoms of physical distress. --   Average METs 4.6 4.09 4.6 4.77 --     Resistance Training   Training Prescription -- Yes Yes Yes --   Weight -- 4 4 4  --   Reps -- 10-15 10-15 10-15 --     Interval Training   Interval Training -- No No No --     Treadmill   MPH -- 3.2 3.2 3.2 --   Grade -- 3.5 3.5 3 --   Minutes -- 15 15 15  --   METs -- 4.99 4.99 4.77 --     NuStep   Level -- -- 3 4 --  Minutes -- -- 15 15 --     Recumbant Elliptical   Level -- 3 -- -- --   Minutes -- 15 -- -- --     Elliptical   Level -- 1 3.2 3.2 --   Speed -- 3.3 3.5 3.3 --   Minutes -- 15 15 15  --     Home Exercise Plan   Plans to continue exercise at -- -- -- -- Home (comment)  online cardio and strength classes and try wellzone   Frequency -- -- -- -- Add 3 additional days to program exercise sessions.   Initial Home Exercises Provided -- -- -- -- 10/29/23     Oxygen   Maintain Oxygen Saturation -- 88% or higher 88% or higher 88% or higher --    Row Name 11/07/23 1200             Response to Exercise   Blood Pressure (Admit) 102/60       Blood Pressure (Exit) 102/60       Heart Rate (Admit) 90 bpm       Heart Rate (Exercise) 143 bpm       Heart Rate (Exit) 101 bpm       Oxygen  Saturation (Admit) 96 %       Oxygen Saturation (Exercise) 89 %       Oxygen Saturation (Exit) 91 %       Rating of Perceived Exertion (Exercise) 12       Perceived Dyspnea (Exercise) 1       Symptoms none       Duration Progress to 30 minutes of  aerobic without signs/symptoms of physical distress       Intensity THRR New         Progression   Progression Continue to progress workloads to maintain intensity without signs/symptoms of physical distress.       Average METs 4.27         Resistance Training   Training Prescription Yes       Weight 4       Reps 10-15         Interval Training   Interval Training No         Treadmill   MPH 3.4       Grade 3       Minutes 15       METs 5.01         Elliptical   Level 3       Speed 3       Minutes 15       METs 3.4         Home Exercise Plan   Plans to continue exercise at Home (comment)  online cardio and strength classes and try wellzone       Frequency Add 3 additional days to program exercise sessions.       Initial Home Exercises Provided 10/29/23         Oxygen   Maintain Oxygen Saturation 88% or higher                Exercise Comments:   Exercise Comments     Row Name 09/12/23 1108           Exercise Comments First full day of exercise!  Patient was oriented to gym and equipment including functions, settings, policies, and procedures.  Patient's individual exercise prescription and treatment plan were reviewed.  All starting workloads were established based on the  results of the 6 minute walk test done at initial orientation visit.  The plan for exercise progression was also introduced and progression will be customized based on patient's performance and goals.                Exercise Goals and Review:   Exercise Goals     Row Name 09/10/23 1500             Exercise Goals   Increase Physical Activity Yes       Intervention Develop an individualized exercise prescription for aerobic and  resistive training based on initial evaluation findings, risk stratification, comorbidities and participant's personal goals.;Provide advice, education, support and counseling about physical activity/exercise needs.       Expected Outcomes Long Term: Exercising regularly at least 3-5 days a week.;Long Term: Add in home exercise to make exercise part of routine and to increase amount of physical activity.;Short Term: Attend rehab on a regular basis to increase amount of physical activity.       Increase Strength and Stamina Yes       Intervention Provide advice, education, support and counseling about physical activity/exercise needs.;Develop an individualized exercise prescription for aerobic and resistive training based on initial evaluation findings, risk stratification, comorbidities and participant's personal goals.       Expected Outcomes Long Term: Improve cardiorespiratory fitness, muscular endurance and strength as measured by increased METs and functional capacity ( );Short Term: Perform resistance training exercises routinely during rehab and add in resistance training at home;Short Term: Increase workloads from initial exercise prescription for resistance, speed, and METs.       Able to understand and use rate of perceived exertion (RPE) scale Yes       Intervention Provide education and explanation on how to use RPE scale       Expected Outcomes Long Term:  Able to use RPE to guide intensity level when exercising independently;Short Term: Able to use RPE daily in rehab to express subjective intensity level       Able to understand and use Dyspnea scale Yes       Intervention Provide education and explanation on how to use Dyspnea scale       Expected Outcomes Long Term: Able to use Dyspnea scale to guide intensity level when exercising independently;Short Term: Able to use Dyspnea scale daily in rehab to express subjective sense of shortness of breath during exertion       Knowledge and  understanding of Target Heart Rate Range (THRR) Yes       Intervention Provide education and explanation of THRR including how the numbers were predicted and where they are located for reference       Expected Outcomes Long Term: Able to use THRR to govern intensity when exercising independently;Short Term: Able to use daily as guideline for intensity in rehab;Short Term: Able to state/look up THRR       Able to check pulse independently Yes       Intervention Review the importance of being able to check your own pulse for safety during independent exercise;Provide education and demonstration on how to check pulse in carotid and radial arteries.       Expected Outcomes Long Term: Able to check pulse independently and accurately;Short Term: Able to explain why pulse checking is important during independent exercise       Understanding of Exercise Prescription Yes       Intervention Provide education, explanation, and written materials on patient's individual exercise  prescription       Expected Outcomes Long Term: Able to explain home exercise prescription to exercise independently;Short Term: Able to explain program exercise prescription                Exercise Goals Re-Evaluation :  Exercise Goals Re-Evaluation     Row Name 09/12/23 1108 09/26/23 1632 10/07/23 1054 10/24/23 1602 10/29/23 1134     Exercise Goal Re-Evaluation   Exercise Goals Review Able to understand and use rate of perceived exertion (RPE) scale;Able to understand and use Dyspnea scale;Knowledge and understanding of Target Heart Rate Range (THRR);Understanding of Exercise Prescription Increase Physical Activity;Increase Strength and Stamina;Understanding of Exercise Prescription Increase Physical Activity;Increase Strength and Stamina;Understanding of Exercise Prescription Increase Physical Activity;Increase Strength and Stamina;Understanding of Exercise Prescription Able to understand and use Dyspnea scale;Increase Physical  Activity;Understanding of Exercise Prescription;Increase Strength and Stamina;Knowledge and understanding of Target Heart Rate Range (THRR);Able to check pulse independently;Able to understand and use rate of perceived exertion (RPE) scale   Comments Reviewed RPE and dyspnea scale, THR and program prescription with pt today.  Pt voiced understanding and was given a copy of goals to take home. Rynlee is off to a good start in the program. She has only attended 3 sessions since her initial orientation to the program. In those 3 sessions she was able to increase her treadmill workload up to a speed of 3.2 mph and an incline of 3.5%. We will continue to monitor her progress in the program. Karla continues to do well in the program. She has been able to maintain her workload on the treadmill at a speed of 3. and a grade of 3.5%. She has also been able to increase her level on the elliptical from level 1 to level 3. We will continue to monitor her progress in the program. Esraa is doing well in rehab. She has been able to increase her level on the T4 nustep from level 3 to 4. She has also maintained her workloads on the elliptical, and treadmill. We will continue to encourage and record her progress in the program. Reviewed home exercise with pt today from 11:20am to 11:30am.  Pt plans add 3 additional days and do online cardio and strength classes, such as mat pilates and cardio dance, and try the wellzone for exercise.  Reviewed THR, pulse, RPE, sign and symptoms, pulse oximetery and when to call 911 or MD.  Also discussed weather considerations and indoor options.  Pt voiced understanding.   Expected Outcomes Short: Use RPE daily to regulate intensity. Long: Follow program prescription in THR. Short: Continue to follow current exercise prescription. Long: Continue exercise to improve strength and stamina. Short: Continue to follow current exercise prescription. Long: Continue exercise to improve strength and stamina.  Short: Increase treadmill and elliptical workloads when able. Long: Continue exercise to improve strength and stamina. Short: Add 3 additional days of cardio and strength classes for home exercise. Long: Continue to exercise independently.    Row Name 10/31/23 1105 11/07/23 1258           Exercise Goal Re-Evaluation   Exercise Goals Review Able to understand and use Dyspnea scale;Increase Physical Activity;Understanding of Exercise Prescription;Increase Strength and Stamina Increase Physical Activity;Understanding of Exercise Prescription;Increase Strength and Stamina      Comments She is working out well here at rehab but still not much at home. Did talk about ways to be more independent at home and coming to wellness zone after rehab if needed. She feels  that would be helpful as she doesnt like working out in the cold. Encouraged her to look for a few youtube videos that she likes so she could use them on cold days or days she cant make it to rehab or wellness zone. Qunisha continues to do well in rehab. She was able to maintain her workload on the treadmill, as well as her workload on the elliptical. We will continue to monitor and encourage her progress in the program.      Expected Outcomes STG: Make sure to set herself up for success by visiitng the wellness zone and looking for youtube exercise videos to do at home. LTG: Exercise independently and improve breathing Short: Continue to progressively increase treadmill and elliptical workload. Long: Continue exercise to improve strength and stamina.               Discharge Exercise Prescription (Final Exercise Prescription Changes):  Exercise Prescription Changes - 11/07/23 1200       Response to Exercise   Blood Pressure (Admit) 102/60    Blood Pressure (Exit) 102/60    Heart Rate (Admit) 90 bpm    Heart Rate (Exercise) 143 bpm    Heart Rate (Exit) 101 bpm    Oxygen Saturation (Admit) 96 %    Oxygen Saturation (Exercise) 89 %    Oxygen  Saturation (Exit) 91 %    Rating of Perceived Exertion (Exercise) 12    Perceived Dyspnea (Exercise) 1    Symptoms none    Duration Progress to 30 minutes of  aerobic without signs/symptoms of physical distress    Intensity THRR New      Progression   Progression Continue to progress workloads to maintain intensity without signs/symptoms of physical distress.    Average METs 4.27      Resistance Training   Training Prescription Yes    Weight 4    Reps 10-15      Interval Training   Interval Training No      Treadmill   MPH 3.4    Grade 3    Minutes 15    METs 5.01      Elliptical   Level 3    Speed 3    Minutes 15    METs 3.4      Home Exercise Plan   Plans to continue exercise at Home (comment)   online cardio and strength classes and try wellzone   Frequency Add 3 additional days to program exercise sessions.    Initial Home Exercises Provided 10/29/23      Oxygen   Maintain Oxygen Saturation 88% or higher             Nutrition:  Target Goals: Understanding of nutrition guidelines, daily intake of sodium 1500mg , cholesterol 200mg , calories 30% from fat and 7% or less from saturated fats, daily to have 5 or more servings of fruits and vegetables.  Education: All About Nutrition: -Group instruction provided by verbal, written material, interactive activities, discussions, models, and posters to present general guidelines for heart healthy nutrition including fat, fiber, MyPlate, the role of sodium in heart healthy nutrition, utilization of the nutrition label, and utilization of this knowledge for meal planning. Follow up email sent as well. Written material given at graduation.   Biometrics:  Pre Biometrics - 09/10/23 1459       Pre Biometrics   Height 5' 3.9" (1.623 m)    Weight 166 lb 11.2 oz (75.6 kg)    Waist Circumference 37.5 inches  Hip Circumference 42.5 inches    Waist to Hip Ratio 0.88 %    BMI (Calculated) 28.71    Single Leg Stand 9.53  seconds              Nutrition Therapy Plan and Nutrition Goals:   Nutrition Assessments:  MEDIFICTS Score Key: >=70 Need to make dietary changes  40-70 Heart Healthy Diet <= 40 Therapeutic Level Cholesterol Diet  Flowsheet Row Pulmonary Rehab from 09/10/2023 in [facility] Cardiac and Pulmonary Rehab  Picture Your Plate Total Score on Admission 49      Picture Your Plate Scores: <16 Unhealthy dietary pattern with much room for improvement. 41-50 Dietary pattern unlikely to meet recommendations for good health and room for improvement. 51-60 More healthful dietary pattern, with some room for improvement.  >60 Healthy dietary pattern, although there may be some specific behaviors that could be improved.   Nutrition Goals Re-Evaluation:  Nutrition Goals Re-Evaluation     Row Name 10/22/23 0951 10/31/23 1127           Goals   Nutrition Goal Rubi states that she feels she is doing well with her diet at this time. She states that she is a little overwhemed with her number of appointments, but would be open to meeting with the RD in the future. Lauryl reports she is working on increasing protein after being precribed a GLP-1 mounjaro. Discussed the importance or protein and demonstrated ways to help meet protein goals with smaller portions. She is drinking a protein shake, muscle milk 42g whey protein. Suggested she could spilt the shake in two and drink each half at different times if needed. She liked that idea and will try keeping snacks with protein shake smaller to avoid feeling overly full at times. She is reading labels and reducing sodium and saturated fat. Provided target goal of less than 1500mg . Reviewed a few important concepts like making nutrient dense balnaced plates with protein and complex carbs. Provided a handout on meal and snack ideas for nutreint dense low caloire choices      Comment Short: Meet with RD in future. Long: Continue to make healthy dietary choices. STG: eat  ~75-90g Pro daily and choose low caloire dense foods to support weight loss. LTG: follow heart healthy diet and maintain happy healthy weight               Nutrition Goals Discharge (Final Nutrition Goals Re-Evaluation):  Nutrition Goals Re-Evaluation - 10/31/23 1127       Goals   Nutrition Goal Angelize reports she is working on increasing protein after being precribed a GLP-1 mounjaro. Discussed the importance or protein and demonstrated ways to help meet protein goals with smaller portions. She is drinking a protein shake, muscle milk 42g whey protein. Suggested she could spilt the shake in two and drink each half at different times if needed. She liked that idea and will try keeping snacks with protein shake smaller to avoid feeling overly full at times. She is reading labels and reducing sodium and saturated fat. Provided target goal of less than 1500mg . Reviewed a few important concepts like making nutrient dense balnaced plates with protein and complex carbs. Provided a handout on meal and snack ideas for nutreint dense low caloire choices    Comment STG: eat ~75-90g Pro daily and choose low caloire dense foods to support weight loss. LTG: follow heart healthy diet and maintain happy healthy weight  Psychosocial: Target Goals: Acknowledge presence or absence of significant depression and/or stress, maximize coping skills, provide positive support system. Participant is able to verbalize types and ability to use techniques and skills needed for reducing stress and depression.   Education: Stress, Anxiety, and Depression - Group verbal and visual presentation to define topics covered.  Reviews how body is impacted by stress, anxiety, and depression.  Also discusses healthy ways to reduce stress and to treat/manage anxiety and depression.  Written material given at graduation.   Education: Sleep Hygiene -Provides group verbal and written instruction about how sleep can affect  your health.  Define sleep hygiene, discuss sleep cycles and impact of sleep habits. Review good sleep hygiene tips.    Initial Review & Psychosocial Screening:  Initial Psych Review & Screening - 09/05/23 1012       Family Dynamics   Good Support System? Yes   Mom and Dad,     Barriers   Psychosocial barriers to participate in program There are no identifiable barriers or psychosocial needs.      Screening Interventions   Interventions To provide support and resources with identified psychosocial needs;Provide feedback about the scores to participant;Encouraged to exercise    Expected Outcomes Short Term goal: Utilizing psychosocial counselor, staff and physician to assist with identification of specific Stressors or current issues interfering with healing process. Setting desired goal for each stressor or current issue identified.;Long Term Goal: Stressors or current issues are controlled or eliminated.;Short Term goal: Identification and review with participant of any Quality of Life or Depression concerns found by scoring the questionnaire.;Long Term goal: The participant improves quality of Life and PHQ9 Scores as seen by post scores and/or verbalization of changes             Quality of Life Scores:  Scores of 19 and below usually indicate a poorer quality of life in these areas.  A difference of  2-3 points is a clinically meaningful difference.  A difference of 2-3 points in the total score of the Quality of Life Index has been associated with significant improvement in overall quality of life, self-image, physical symptoms, and general health in studies assessing change in quality of life.  PHQ-9: Review Flowsheet       09/10/2023  Depression screen PHQ 2/9  Decreased Interest 0  Down, Depressed, Hopeless 0  PHQ - 2 Score 0  Altered sleeping 1  Tired, decreased energy 1  Change in appetite 0  Feeling bad or failure about yourself  0  Trouble concentrating 0  Moving  slowly or fidgety/restless 0  Suicidal thoughts 0  PHQ-9 Score 2  Difficult doing work/chores Not difficult at all   Interpretation of Total Score  Total Score Depression Severity:  1-4 = Minimal depression, 5-9 = Mild depression, 10-14 = Moderate depression, 15-19 = Moderately severe depression, 20-27 = Severe depression   Psychosocial Evaluation and Intervention:  Psychosocial Evaluation - 09/05/23 1026       Psychosocial Evaluation & Interventions   Interventions Encouraged to exercise with the program and follow exercise prescription    Comments Taj has no barriers to attending the program.  She did have anxiety about exercise after her HR went up to 155 when she got up to move in the hospital. TAlking with her physician ,she feels less anxiety being sent to the PR program and being in a clinical place to exercise. She has a new diagnosis of PUlmonary hypertension and is working with her doctors to  do all they can to keep it controlled, and to understand how to manage with the disease.  She has support from her Mom and Dad. She lives with her 2 children.  She is ready to get started with the program.    Expected Outcomes STG attends all scheduled sessions, continues to work on reducing any anxiety about exercising with Pul HTN. LTG  Continues exercise progression after discharge, continue to work with her physician for any anxiety control    Continue Psychosocial Services  Follow up required by staff             Psychosocial Re-Evaluation:  Psychosocial Re-Evaluation     Row Name 10/22/23 0948 10/31/23 1110           Psychosocial Re-Evaluation   Current issues with -- None Identified      Comments Mirra reports no major stressors at this time. She enjoys scrolling on tik tok for stress relief. She also likes to volunteer at her church when they need her help. She has a good support system made up by her parents, family, and church. She states that she has been sleeping better  since rehab but is still waking up some throughout the night. She expressed their is no stressors or depression or anxeity. She reports the exercise she is getting from rehab has significantly improved her sleep and mood as well. She has good support system with friends, family and church. She likes to watch [employer] Lac[employer] and relax. Encouraged her to remember exercise as a good stress reliever      Expected Outcomes Short: Continue to attend pulmonary rehab for stress relief. Long: Continue to maintain positive outlook. STG: Continue to attend pulmonary rehab for stress relief. LTG: Continue to use support systems to maintain positive outlook on health outcomes and daily life      Interventions Encouraged to attend Pulmonary Rehabilitation for the exercise Encouraged to attend Pulmonary Rehabilitation for the exercise      Continue Psychosocial Services  Follow up required by staff Follow up required by staff               Psychosocial Discharge (Final Psychosocial Re-Evaluation):  Psychosocial Re-Evaluation - 10/31/23 1110       Psychosocial Re-Evaluation   Current issues with None Identified    Comments She expressed their is no stressors or depression or anxeity. She reports the exercise she is getting from rehab has significantly improved her sleep and mood as well. She has good support system with friends, family and church. She likes to watch [employer] Lacrosse and relax. Encouraged her to remember exercise as a good stress reliever    Expected Outcomes STG: Continue to attend pulmonary rehab for stress relief. LTG: Continue to use support systems to maintain positive outlook on health outcomes and daily life    Interventions Encouraged to attend Pulmonary Rehabilitation for the exercise    Continue Psychosocial Services  Follow up required by staff             Education: Education Goals: Education classes will be provided on a weekly basis, covering required topics. Participant will state  understanding/return demonstration of topics presented.  Learning Barriers/Preferences:  Learning Barriers/Preferences - 09/05/23 1013       Learning Barriers/Preferences   Learning Barriers None    Learning Preferences None             General Pulmonary Education Topics:  Infection Prevention: - Provides verbal and written material to individual with  discussion of infection control including proper hand washing and proper equipment cleaning during exercise session. Flowsheet Row Pulmonary Rehab from 09/10/2023 in [facility] Cardiac and Pulmonary Rehab  Date 09/10/23  Educator [facility]  Instruction Review Code 1- Verbalizes Understanding       Falls Prevention: - Provides verbal and written material to individual with discussion of falls prevention and safety. Flowsheet Row Pulmonary Rehab from 09/10/2023 in [facility] Cardiac and Pulmonary Rehab  Date 09/10/23  Educator [facility]  Instruction Review Code 1- Verbalizes Understanding       Chronic Lung Disease Review: - Group verbal instruction with posters, models, PowerPoint presentations and videos,  to review new updates, new respiratory medications, new advancements in procedures and treatments. Providing information on websites and "800" numbers for continued self-education. Includes information about supplement oxygen, available portable oxygen systems, continuous and intermittent flow rates, oxygen safety, concentrators, and Medicare reimbursement for oxygen. Explanation of Pulmonary Drugs, including class, frequency, complications, importance of spacers, rinsing mouth after steroid MDI's, and proper cleaning methods for nebulizers. Review of basic lung anatomy and physiology related to function, structure, and complications of lung disease. Review of risk factors. Discussion about methods for diagnosing sleep apnea and types of masks and machines for OSA. Includes a review of the use of types of environmental controls: home humidity, furnaces,  filters, dust mite/pet prevention, HEPA vacuums. Discussion about weather changes, air quality and the benefits of nasal washing. Instruction on Warning signs, infection symptoms, calling MD promptly, preventive modes, and value of vaccinations. Review of effective airway clearance, coughing and/or vibration techniques. Emphasizing that all should Create an Action Plan. Written material given at graduation.   AED/CPR: - Group verbal and written instruction with the use of models to demonstrate the basic use of the AED with the basic ABC's of resuscitation.    Anatomy and Cardiac Procedures: - Group verbal and visual presentation and models provide information about basic cardiac anatomy and function. Reviews the testing methods done to diagnose heart disease and the outcomes of the test results. Describes the treatment choices: Medical Management, Angioplasty, or Coronary Bypass Surgery for treating various heart conditions including Myocardial Infarction, Angina, Valve Disease, and Cardiac Arrhythmias.  Written material given at graduation.   Medication Safety: - Group verbal and visual instruction to review commonly prescribed medications for heart and lung disease. Reviews the medication, class of the drug, and side effects. Includes the steps to properly store meds and maintain the prescription regimen.  Written material given at graduation.   Other: -Provides group and verbal instruction on various topics (see comments)   Knowledge Questionnaire Score:    Core Components/Risk Factors/Patient Goals at Admission:  Personal Goals and Risk Factors at Admission - 09/05/23 1016       Core Components/Risk Factors/Patient Goals on Admission    Weight Management Yes    Intervention Weight Management: Develop a combined nutrition and exercise program designed to reach desired caloric intake, while maintaining appropriate intake of nutrient and fiber, sodium and fats, and appropriate energy  expenditure required for the weight goal.;Weight Management: Provide education and appropriate resources to help participant work on and attain dietary goals.    Admit Weight 164 lb (74.4 kg)   has lost 7 lbs   Goal Weight: Short Term 163 lb (73.9 kg)    Goal Weight: Long Term 135 lb (61.2 kg)    Expected Outcomes Short Term: Continue to assess and modify interventions until short term weight is achieved;Long Term: Adherence to nutrition and physical  activity/exercise program aimed toward attainment of established weight goal;Weight Loss: Understanding of general recommendations for a balanced deficit meal plan, which promotes 1-2 lb weight loss per week and includes a negative energy balance of [phone] kcal/d    Improve shortness of breath with ADL's Yes    Intervention Provide education, individualized exercise plan and daily activity instruction to help decrease symptoms of SOB with activities of daily living.    Expected Outcomes Short Term: Improve cardiorespiratory fitness to achieve a reduction of symptoms when performing ADLs;Long Term: Be able to perform more ADLs without symptoms or delay the onset of symptoms    Heart Failure Yes    Intervention Provide a combined exercise and nutrition program that is supplemented with education, support and counseling about heart failure. Directed toward relieving symptoms such as shortness of breath, decreased exercise tolerance, and extremity edema.    Expected Outcomes Improve functional capacity of life;Short term: Attendance in program 2-3 days a week with increased exercise capacity. Reported lower sodium intake. Reported increased fruit and vegetable intake. Reports medication compliance.;Short term: Daily weights obtained and reported for increase. Utilizing diuretic protocols set by physician.;Long term: Adoption of self-care skills and reduction of barriers for early signs and symptoms recognition and intervention leading to self-care maintenance.     Lipids Yes    Intervention Provide education and support for participant on nutrition & aerobic/resistive exercise along with prescribed medications to achieve LDL 70mg , HDL >40mg .    Expected Outcomes Short Term: Participant states understanding of desired cholesterol values and is compliant with medications prescribed. Participant is following exercise prescription and nutrition guidelines.;Long Term: Cholesterol controlled with medications as prescribed, with individualized exercise RX and with personalized nutrition plan. Value goals: LDL < 70mg , HDL > 40 mg.             Education:Diabetes - Individual verbal and written instruction to review signs/symptoms of diabetes, desired ranges of glucose level fasting, after meals and with exercise. Acknowledge that pre and post exercise glucose checks will be done for 3 sessions at entry of program.   Know Your Numbers and Heart Failure: - Group verbal and visual instruction to discuss disease risk factors for cardiac and pulmonary disease and treatment options.  Reviews associated critical values for Overweight/Obesity, Hypertension, Cholesterol, and Diabetes.  Discusses basics of heart failure: signs/symptoms and treatments.  Introduces Heart Failure Zone chart for action plan for heart failure.  Written material given at graduation.   Core Components/Risk Factors/Patient Goals Review:   Goals and Risk Factor Review     Row Name 10/22/23 0952 10/31/23 1120           Core Components/Risk Factors/Patient Goals Review   Personal Goals Review Weight Management/Obesity;Improve shortness of breath with ADL's;Hypertension Weight Management/Obesity;Improve shortness of breath with ADL's      Review Sacora states that she is doing well with maintaining her weight around 160 lbs. She would like to lose a little weight with a weight goal of 135 lb. She states that she does own a BP cuff at home and is checking it occasionally. She reports that her BP  has stayed within normal ranges. Jadalee states that her SOB has not improved too much lately. However, we revied PLB techniques and encouraged her to practice PLB during both exercise and during ADL's. She is happy with her weight loss progress so far. She is on mounjaro and has been tolerating it well, with a dose increase coming up in the week or so. She  reports she was ~170lbs in september is down to 157.3lbs today. Commended her on her progress and reminder her than all hnages today must be sustainable if she is to reach her happy body weight and maintain it. She understands and is working on making these chnages more of a lifestyle for her. She is motivated to continue to progress since her SOB has been much imporved when doing ADLs.      Expected Outcomes Short: Continue to work towards weight goal through diet and exercise. Long: Continue to monitor lifestyle risk factors. SHG: continue to work on weight loss and increased exercise workload. LTG: reach happy body weight and maintain lifestyle changes for better health and breathing               Core Components/Risk Factors/Patient Goals at Discharge (Final Review):   Goals and Risk Factor Review - 10/31/23 1120       Core Components/Risk Factors/Patient Goals Review   Personal Goals Review Weight Management/Obesity;Improve shortness of breath with ADL's    Review She is happy with her weight loss progress so far. She is on mounjaro and has been tolerating it well, with a dose increase coming up in the week or so. She reports she was ~170lbs in september is down to 157.3lbs today. Commended her on her progress and reminder her than all hnages today must be sustainable if she is to reach her happy body weight and maintain it. She understands and is working on making these chnages more of a lifestyle for her. She is motivated to continue to progress since her SOB has been much imporved when doing ADLs.    Expected Outcomes SHG: continue to work on  weight loss and increased exercise workload. LTG: reach happy body weight and maintain lifestyle changes for better health and breathing             ITP Comments:  ITP Comments     Row Name 09/05/23 1033 09/10/23 1453 09/12/23 1107 09/25/23 1007 10/23/23 1114   ITP Comments Virtual orientation call completed today. shehas an appointment on Date: 09/10/2023  for EP eval and gym Orientation.  Documentation of diagnosis can be found in [facility] Date: 08/30/2023 . Completed and gym orientation. Initial ITP created and sent for review to Dr. Faud Aleskerov, Medical Director. First full day of exercise!  Patient was oriented to gym and equipment including functions, settings, policies, and procedures.  Patient's individual exercise prescription and treatment plan were reviewed.  All starting workloads were established based on the results of the 6 minute walk test done at initial orientation visit.  The plan for exercise progression was also introduced and progression will be customized based on patient's performance and goals. 30 Day review completed. Medical Director ITP review done, changes made as directed, and signed approval by Medical Director.    new to program 30 Day review completed. Medical Director ITP review done, changes made as directed, and signed approval by Medical Director.    Row Name 11/20/23 1434           ITP Comments 30 Day review completed. Medical Director ITP review done, changes made as directed, and signed approval by Medical Director.                Comments: 30 day review

## 2023-11-21 ENCOUNTER — Encounter: Payer: Managed Care, Other (non HMO) | Admitting: *Deleted

## 2023-11-21 DIAGNOSIS — I272 Pulmonary hypertension, unspecified: Secondary | ICD-10-CM | POA: Diagnosis not present

## 2023-11-21 NOTE — Progress Notes (Signed)
Daily Session Note  Patient Details  Name: [name] MRN: [id number] Date of Birth: [birth year] Referring Provider:   Flowsheet Row Pulmonary Rehab from 09/10/2023 in [facility] Cardiac and Pulmonary Rehab  Referring Provider Dr. Arvilla Meres       Encounter Date: 11/21/2023  Check In:  Session Check In - 11/21/23 1119       Check-In   Supervising physician immediately available to respond to emergencies See telemetry face sheet for immediately available ER MD    Location [facility]-Cardiac & Pulmonary Rehab    Staff Present Cora Collum, RN, BSN, CCRP;Meredith Jewel Baize RN,BSN;[employer], MS, Exercise Physiologist    Virtual Visit No    Medication changes reported     No    Fall or balance concerns reported    No    Warm-up and Cool-down Performed on first and last piece of equipment    Resistance Training Performed Yes    VAD Patient? No    PAD/SET Patient? No      Pain Assessment   Currently in Pain? No/denies                Social History   Tobacco Use  Smoking Status Former   Current packs/day: 0.00   Types: Cigarettes   Quit date: 10/21/2019   Years since quitting: 4.0  Smokeless Tobacco Never    Goals Met:  Proper associated with RPD/PD & O2 Sat Independence with exercise equipment Exercise tolerated well No report of concerns or symptoms today  Goals Unmet:  Not Applicable  Comments: Pt able to follow exercise prescription today without complaint.  Will continue to monitor for progression.    Dr. Bethann Punches is Medical Director for [facility] Cardiac Rehabilitation.  Dr. Vida Rigger is Medical Director for [facility] Pulmonary Rehabilitation.

## 2023-11-25 ENCOUNTER — Ambulatory Visit: Payer: Managed Care, Other (non HMO)

## 2023-11-26 DIAGNOSIS — I272 Pulmonary hypertension, unspecified: Secondary | ICD-10-CM

## 2023-11-26 NOTE — Progress Notes (Signed)
Daily Session Note  Patient Details  Name: [name] MRN: [id number] Date of Birth: 08/26/1976 Referring Provider:   Flowsheet Row Pulmonary Rehab from 09/10/2023 in [facility] Cardiac and Pulmonary Rehab  Referring Provider Dr. Arvilla Meres       Encounter Date: 11/26/2023  Check In:  Session Check In - 11/26/23 1102       Check-In   Supervising physician immediately available to respond to emergencies See telemetry face sheet for immediately available ER MD    Location [facility]-Cardiac & Pulmonary Rehab    Staff Present Kelton Pillar RN,BSN,MPA;Jason Wallace Cullens RDN,LDN;Maxon Conetta BS, Exercise Physiologist;Meredith Jewel Baize RN,BSN    Virtual Visit No    Medication changes reported     No    Fall or balance concerns reported    No    Tobacco Cessation No Change    Warm-up and Cool-down Performed on first and last piece of equipment    Resistance Training Performed Yes    VAD Patient? No    PAD/SET Patient? No      Pain Assessment   Currently in Pain? No/denies                Social History   Tobacco Use  Smoking Status Former   Current packs/day: 0.00   Types: Cigarettes   Quit date: 10/21/2019   Years since quitting: 4.1  Smokeless Tobacco Never    Goals Met:  Independence with exercise equipment Exercise tolerated well No report of concerns or symptoms today Strength training completed today  Goals Unmet:  Not Applicable  Comments: Pt able to follow exercise prescription today without complaint.  Will continue to monitor for progression.    Dr. Bethann Punches is Medical Director for [facility] Cardiac Rehabilitation.  Dr. Vida Rigger is Medical Director for [facility] Pulmonary Rehabilitation.

## 2023-11-27 ENCOUNTER — Other Ambulatory Visit (HOSPITAL_COMMUNITY): Payer: Self-pay

## 2023-11-27 ENCOUNTER — Telehealth: Payer: Self-pay | Admitting: Pharmacist

## 2023-11-27 ENCOUNTER — Encounter: Payer: Self-pay | Admitting: Oncology

## 2023-11-27 ENCOUNTER — Telehealth: Payer: Self-pay

## 2023-11-27 MED ORDER — MOUNJARO 7.5 MG/0.5ML ~~LOC~~ SOAJ: 7.5000 mg | SUBCUTANEOUS | 0 refills | Status: DC

## 2023-11-27 NOTE — Telephone Encounter (Signed)
Advanced Heart Failure Patient Advocate Encounter  Prior authorization for Tadalafil [facility]) has been submitted and approved. Test billing returns $4 for 30 day supply.  Key: [id number] Effective: 11/27/2023 to 11/25/2024  Burnell Blanks, CPhT Rx Patient Advocate Phone: [phone]

## 2023-11-27 NOTE — Telephone Encounter (Signed)
Patient tolerating Mounjaro 5 mg weekly well without issue. Prescription sent for 7.5 mg dose.

## 2023-11-27 NOTE — Telephone Encounter (Signed)
Prior authorization completed for tadalafil in 2025.

## 2023-11-28 ENCOUNTER — Encounter: Payer: Managed Care, Other (non HMO) | Admitting: *Deleted

## 2023-11-28 DIAGNOSIS — I272 Pulmonary hypertension, unspecified: Secondary | ICD-10-CM | POA: Diagnosis not present

## 2023-11-28 NOTE — Progress Notes (Signed)
Daily Session Note  Patient Details  Name: RENETA QIAO MRN: [id number] Date of Birth: 1976-08-13 Referring Provider:   Flowsheet Row Pulmonary Rehab from 09/10/2023 in [facility] Cardiac and Pulmonary Rehab  Referring Provider Dr. Arvilla Meres       Encounter Date: 11/28/2023  Check In:  Session Check In - 11/28/23 1123       Check-In   Supervising physician immediately available to respond to emergencies See telemetry face sheet for immediately available ER MD    Location [facility]-Cardiac & Pulmonary Rehab    Staff Present Cora Collum, RN, BSN, CCRP;Meredith Jewel Baize RN,BSN;Maxon Brundidge BS, Exercise Physiologist    Virtual Visit No    Medication changes reported     No    Fall or balance concerns reported    No    Warm-up and Cool-down Performed on first and last piece of equipment    Resistance Training Performed Yes    VAD Patient? No    PAD/SET Patient? No      Pain Assessment   Currently in Pain? No/denies                Social History   Tobacco Use  Smoking Status Former   Current packs/day: 0.00   Types: Cigarettes   Quit date: 10/21/2019   Years since quitting: 4.1  Smokeless Tobacco Never    Goals Met:  Proper associated with RPD/PD & O2 Sat Independence with exercise equipment Exercise tolerated well No report of concerns or symptoms today  Goals Unmet:  Not Applicable  Comments: Pt able to follow exercise prescription today without complaint.  Will continue to monitor for progression.    Dr. Bethann Punches is Medical Director for [facility] Cardiac Rehabilitation.  Dr. Vida Rigger is Medical Director for [facility] Pulmonary Rehabilitation.

## 2023-11-30 ENCOUNTER — Other Ambulatory Visit: Payer: Self-pay | Admitting: Internal Medicine

## 2023-12-02 ENCOUNTER — Encounter: Payer: Self-pay | Admitting: Pulmonary Disease

## 2023-12-02 ENCOUNTER — Ambulatory Visit: Payer: Managed Care, Other (non HMO)

## 2023-12-03 ENCOUNTER — Encounter: Payer: Managed Care, Other (non HMO) | Admitting: *Deleted

## 2023-12-03 DIAGNOSIS — I272 Pulmonary hypertension, unspecified: Secondary | ICD-10-CM

## 2023-12-03 NOTE — Progress Notes (Signed)
Daily Session Note  Patient Details  Name: ATHALIAH BAUMBACH MRN: [id number] Date of Birth: Mar 18, 1976 Referring Provider:   Flowsheet Row Pulmonary Rehab from 09/10/2023 in [facility] Cardiac and Pulmonary Rehab  Referring Provider Dr. Arvilla Meres       Encounter Date: 12/03/2023  Check In:  Session Check In - 12/03/23 1121       Check-In   Supervising physician immediately available to respond to emergencies See telemetry face sheet for immediately available ER MD    Location [facility]-Cardiac & Pulmonary Rehab    Staff Present Rory Percy, MS, Exercise Physiologist;Jason Wallace Cullens RDN,LDN;Maxon Conetta BS, Exercise Physiologist;Nina Mondor, RN, BSN, CCRP;Meredith Craven RN,BSN;Noah Tickle, BS, Exercise Physiologist    Virtual Visit No    Medication changes reported     No    Fall or balance concerns reported    No    Warm-up and Cool-down Performed on first and last piece of equipment    Resistance Training Performed Yes    VAD Patient? No    PAD/SET Patient? No      Pain Assessment   Currently in Pain? No/denies                Social History   Tobacco Use  Smoking Status Former   Current packs/day: 0.00   Types: Cigarettes   Quit date: 10/21/2019   Years since quitting: 4.1  Smokeless Tobacco Never    Goals Met:  Proper associated with RPD/PD & O2 Sat Independence with exercise equipment Exercise tolerated well No report of concerns or symptoms today  Goals Unmet:  Not Applicable  Comments: Pt able to follow exercise prescription today without complaint.  Will continue to monitor for progression.    Dr. Bethann Punches is Medical Director for [facility] Cardiac Rehabilitation.  Dr. Vida Rigger is Medical Director for [facility] Pulmonary Rehabilitation.

## 2023-12-05 ENCOUNTER — Encounter: Payer: Managed Care, Other (non HMO) | Admitting: *Deleted

## 2023-12-05 DIAGNOSIS — I272 Pulmonary hypertension, unspecified: Secondary | ICD-10-CM

## 2023-12-05 NOTE — Progress Notes (Signed)
Daily Session Note  Patient Details  Name: AALYSSA ELDERKIN MRN: [id number] Date of Birth: 10/31/1976 Referring Provider:   Flowsheet Row Pulmonary Rehab from 09/10/2023 in [facility] Cardiac and Pulmonary Rehab  Referring Provider Dr. Arvilla Meres       Encounter Date: 12/05/2023  Check In:  Session Check In - 12/05/23 1135       Check-In   Supervising physician immediately available to respond to emergencies See telemetry face sheet for immediately available ER MD    Location [facility]-Cardiac & Pulmonary Rehab    Staff Present Cora Collum, RN, BSN, CCRP;Meredith Jewel Baize RN,BSN;Noah Tickle, BS, Exercise Physiologist;Joseph Hood RCP,RRT,BSRT    Virtual Visit No    Medication changes reported     No    Fall or balance concerns reported    No    Warm-up and Cool-down Performed on first and last piece of equipment    Resistance Training Performed Yes    VAD Patient? No    PAD/SET Patient? No      Pain Assessment   Currently in Pain? No/denies                Social History   Tobacco Use  Smoking Status Former   Current packs/day: 0.00   Types: Cigarettes   Quit date: 10/21/2019   Years since quitting: 4.1  Smokeless Tobacco Never    Goals Met:  Proper associated with RPD/PD & O2 Sat Independence with exercise equipment Exercise tolerated well No report of concerns or symptoms today  Goals Unmet:  Not Applicable  Comments: Pt able to follow exercise prescription today without complaint.  Will continue to monitor for progression.    Dr. Bethann Punches is Medical Director for [facility] Cardiac Rehabilitation.  Dr. Vida Rigger is Medical Director for [facility] Pulmonary Rehabilitation.

## 2023-12-09 ENCOUNTER — Ambulatory Visit: Payer: Managed Care, Other (non HMO)

## 2023-12-10 ENCOUNTER — Encounter: Payer: Managed Care, Other (non HMO) | Attending: Internal Medicine | Admitting: *Deleted

## 2023-12-10 DIAGNOSIS — I272 Pulmonary hypertension, unspecified: Secondary | ICD-10-CM | POA: Insufficient documentation

## 2023-12-10 NOTE — Progress Notes (Signed)
Daily Session Note  Patient Details  Name: [name] MRN: [id number] Date of Birth: 11-16-1975 Referring Provider:   Flowsheet Row Pulmonary Rehab from 09/10/2023 in [facility] Cardiac and Pulmonary Rehab  Referring Provider Dr. Arvilla Meres       Encounter Date: 12/10/2023  Check In:  Session Check In - 12/10/23 1118       Check-In   Supervising physician immediately available to respond to emergencies See telemetry face sheet for immediately available ER MD    Location [facility]-Cardiac & Pulmonary Rehab    Staff Present Rory Percy, MS, Exercise Physiologist;Maxon Conetta BS, Exercise Physiologist;Jason Wallace Cullens RDN,LDN;Pearlene Teat, RN, BSN, CCRP;Meredith Craven RN,BSN;Noah Tickle, BS, Exercise Physiologist    Virtual Visit No    Medication changes reported     No    Fall or balance concerns reported    No    Warm-up and Cool-down Performed on first and last piece of equipment    Resistance Training Performed Yes    VAD Patient? No    PAD/SET Patient? No      Pain Assessment   Currently in Pain? No/denies                Social History   Tobacco Use  Smoking Status Former   Current packs/day: 0.00   Types: Cigarettes   Quit date: 10/21/2019   Years since quitting: 4.1  Smokeless Tobacco Never    Goals Met:  Proper associated with RPD/PD & O2 Sat Independence with exercise equipment Exercise tolerated well No report of concerns or symptoms today  Goals Unmet:  Not Applicable  Comments: Pt able to follow exercise prescription today without complaint.  Will continue to monitor for progression.    Dr. Bethann Punches is Medical Director for [facility] Cardiac Rehabilitation.  Dr. Vida Rigger is Medical Director for [facility] Pulmonary Rehabilitation.

## 2023-12-12 ENCOUNTER — Encounter: Payer: Managed Care, Other (non HMO) | Admitting: *Deleted

## 2023-12-12 DIAGNOSIS — I272 Pulmonary hypertension, unspecified: Secondary | ICD-10-CM | POA: Diagnosis not present

## 2023-12-12 NOTE — Progress Notes (Signed)
 Daily Session Note  Patient Details  Name: [name] MRN: [id number] Date of Birth: 05/09/1976 Referring Provider:   Flowsheet Row Pulmonary Rehab from 09/10/2023 in [facility] Cardiac and Pulmonary Rehab  Referring Provider Dr. Toribio Fuel       Encounter Date: 12/12/2023  Check In:  Session Check In - 12/12/23 1119       Check-In   Supervising physician immediately available to respond to emergencies See telemetry face sheet for immediately available ER MD    Location [facility]-Cardiac & Pulmonary Rehab    Staff Present Othel Durand, RN, BSN, CCRP;Noah Tickle, BS, Exercise Physiologist;Meredith Tressa RN,BSN;Maxon Conetta BS, Exercise Physiologist;Joseph Hood RCP,RRT,BSRT    Virtual Visit No    Medication changes reported     No    Fall or balance concerns reported    No    Warm-up and Cool-down Performed on first and last piece of equipment    Resistance Training Performed Yes    VAD Patient? No    PAD/SET Patient? No      Pain Assessment   Currently in Pain? No/denies                Social History   Tobacco Use  Smoking Status Former   Current packs/day: 0.00   Types: Cigarettes   Quit date: 10/21/2019   Years since quitting: 4.1  Smokeless Tobacco Never    Goals Met:  Proper associated with RPD/PD & O2 Sat Independence with exercise equipment Exercise tolerated well No report of concerns or symptoms today  Goals Unmet:  Not Applicable  Comments: Pt able to follow exercise prescription today without complaint.  Will continue to monitor for progression.    Dr. Oneil Pinal is Medical Director for [facility] Cardiac Rehabilitation.  Dr. Fuad Aleskerov is Medical Director for [facility] Pulmonary Rehabilitation.

## 2023-12-13 ENCOUNTER — Ambulatory Visit
Admission: RE | Admit: 2023-12-13 | Discharge: 2023-12-13 | Disposition: A | Discharge status: Home / Self Care | Payer: Managed Care, Other (non HMO) | Source: Ambulatory Visit | Attending: Pulmonary Disease | Admitting: Pulmonary Disease

## 2023-12-13 DIAGNOSIS — J449 Chronic obstructive pulmonary disease, unspecified: Secondary | ICD-10-CM | POA: Insufficient documentation

## 2023-12-16 ENCOUNTER — Ambulatory Visit: Payer: Managed Care, Other (non HMO)

## 2023-12-17 ENCOUNTER — Encounter: Payer: Managed Care, Other (non HMO) | Admitting: *Deleted

## 2023-12-17 ENCOUNTER — Other Ambulatory Visit (HOSPITAL_COMMUNITY): Payer: Self-pay

## 2023-12-17 DIAGNOSIS — I272 Pulmonary hypertension, unspecified: Secondary | ICD-10-CM

## 2023-12-17 NOTE — Progress Notes (Signed)
Daily Session Note  Patient Details  Name: [name] MRN: [id number] Date of Birth: 1976/07/17 Referring Provider:   Flowsheet Row Pulmonary Rehab from 09/10/2023 in [facility] Cardiac and Pulmonary Rehab  Referring Provider Dr. Arvilla Meres       Encounter Date: 12/17/2023  Check In:  Session Check In - 12/17/23 1117       Check-In   Supervising physician immediately available to respond to emergencies See telemetry face sheet for immediately available ER MD    Location [facility]-Cardiac & Pulmonary Rehab    Staff Present Cora Collum, RN, BSN, CCRP;Margaret Best, MS, Exercise Physiologist;Maxon Suzzette Righter, Exercise Physiologist;Meredith Jewel Baize RN,BSN;Noah Tickle, BS, Exercise Physiologist;Jason Wallace Cullens RDN,LDN    Virtual Visit No    Medication changes reported     No    Fall or balance concerns reported    No    Warm-up and Cool-down Performed on first and last piece of equipment    Resistance Training Performed Yes    VAD Patient? No    PAD/SET Patient? No      Pain Assessment   Currently in Pain? No/denies                Social History   Tobacco Use  Smoking Status Former   Current packs/day: 0.00   Types: Cigarettes   Quit date: 10/21/2019   Years since quitting: 4.1  Smokeless Tobacco Never    Goals Met:  Proper associated with RPD/PD & O2 Sat Independence with exercise equipment Exercise tolerated well No report of concerns or symptoms today  Goals Unmet:  Not Applicable  Comments: Pt able to follow exercise prescription today without complaint.  Will continue to monitor for progression.    Dr. Bethann Punches is Medical Director for [facility] Cardiac Rehabilitation.  Dr. Vida Rigger is Medical Director for [facility] Pulmonary Rehabilitation.

## 2023-12-18 ENCOUNTER — Encounter: Payer: Self-pay | Admitting: *Deleted

## 2023-12-18 DIAGNOSIS — I272 Pulmonary hypertension, unspecified: Secondary | ICD-10-CM

## 2023-12-18 NOTE — Progress Notes (Signed)
 Pulmonary Individual Treatment Plan  Patient Details  Name: [name] MRN: [id number] Date of Birth: 1976/08/23 Referring Provider:   Flowsheet Row Pulmonary Rehab from 09/10/2023 in [facility] Cardiac and Pulmonary Rehab  Referring Provider Dr. Arvilla Meres       Initial Encounter Date:  Flowsheet Row Pulmonary Rehab from 09/10/2023 in [facility] Cardiac and Pulmonary Rehab  Date 09/10/23       Visit Diagnosis: Pulmonary hypertension (HCC)  Patient's Home Medications on Admission:  Current Outpatient Medications:    Budeson-Glycopyrrol-Formoterol (BREZTRI AEROSPHERE) 160-9-4.8 MCG/ACT AERO, Inhale 2 puffs into the lungs in the morning and at bedtime., Disp: 1 each, Rfl: 3   Budeson-Glycopyrrol-Formoterol (BREZTRI AEROSPHERE) 160-9-4.8 MCG/ACT AERO, Inhale 2 puffs into the lungs in the morning and at bedtime., Disp: 11.8 g, Rfl: 0   dapagliflozin propanediol (FARXIGA) 10 MG TABS tablet, Take 1 tablet (10 mg total) by mouth daily., Disp: 30 tablet, Rfl: 11   digoxin (LANOXIN) 0.125 MG tablet, TAKE 0.5 TABLETS (0.0625 MG TOTAL) BY MOUTH DAILY., Disp: 45 tablet, Rfl: 3   fluticasone (FLONASE) 50 MCG/ACT nasal spray, Place 1 spray into both nostrils daily., Disp: 100 mL, Rfl: 2   macitentan (OPSUMIT) 10 MG tablet, Take 1 tablet (10 mg total) by mouth daily., Disp: , Rfl:    rosuvastatin (CRESTOR) 10 MG tablet, TAKE 1 TABLET BY MOUTH EVERY DAY, Disp: 90 tablet, Rfl: 1   tadalafil, PAH, (ADCIRCA) 20 MG tablet, Take 2 tablets (40 mg total) by mouth daily., Disp: 60 tablet, Rfl: 11   tirzepatide (MOUNJARO) 7.5 MG/0.5ML Pen, Inject 7.5 mg into the skin once a week., Disp: 2 mL, Rfl: 0   VENTOLIN HFA 108 (90 Base) MCG/ACT inhaler, Inhale 1-2 puffs into the lungs every 4 (four) hours as needed for wheezing., Disp: , Rfl:   Past Medical History: Past Medical History:  Diagnosis Date   Abnormal thyroid function test    Anemia    Anxiety    Bronchitis 06/2023   finished prednisone and currently  taking Amoxicillin as of 06-12-23   Cervical lymphadenopathy    Chronic back pain    Ectopic pregnancy    Headache    MIGRAINES   History of kidney stones    HSV infection    Lumbar radiculopathy    Menorrhagia    Palpitations    Pneumonia    06/2023   PONV (postoperative nausea and vomiting)    DURING KIDNEY STONE REMOVAL   Seizures (HCC) 2014   one occurence-due to Tramadol   UTI (urinary tract infection)     Tobacco Use: Social History   Tobacco Use  Smoking Status Former   Current packs/day: 0.00   Types: Cigarettes   Quit date: 10/21/2019   Years since quitting: 4.1  Smokeless Tobacco Never    Labs: Review Flowsheet  More data exists      Latest Ref Rng & Units 04/20/2009 03/01/2021 07/18/2023 07/19/2023 07/22/2023  Labs for ITP Cardiac and Pulmonary Rehab  Cholestrol 0 - 200 mg/dL - - 829  - -  LDL (calc) 0 - 99 mg/dL - - 562  - -  HDL-C >13 mg/dL - - 41  - -  Trlycerides <150 mg/dL - - 79  - -  Hemoglobin A1c 4.8 - 5.6 % - 5.8  - 5.7  -  PH, Arterial 7.35 - 7.45 - - - - 7.459   PCO2 arterial 32 - 48 mmHg - - - - 32.1   Bicarbonate 20.0 - 28.0 mmol/L - -  21.2  - 28.6  28.5  22.7   TCO2 22 - 32 mmol/L 21  - - - 30  30  24    Acid-base deficit 0.0 - 2.0 mmol/L - - 3.1  - -  O2 Saturation % - - 75.1  - 59  57  95     Details       Multiple values from one day are sorted in reverse-chronological order          Pulmonary Assessment Scores:  Pulmonary Assessment Scores     Row Name 09/10/23 1507 09/12/23 1349       ADL UCSD   ADL Phase -- Entry    SOB Score total -- 5    Rest -- 0    Walk -- 0    Stairs -- 2    Bath -- 1    Dress -- 1    Shop -- 1      CAT Score   CAT Score -- 12      mMRC Score   mMRC Score 1 --             UCSD: Self-administered rating of dyspnea associated with activities of daily living (ADLs) 6-point scale (0 = "not at all" to 5 = "maximal or unable to do because of breathlessness")  Scoring Scores range from 0  to 120.  Minimally important difference is 5 units  CAT: CAT can identify the health impairment of COPD patients and is better correlated with disease progression.  CAT has a scoring range of zero to 40. The CAT score is classified into four groups of low (less than 10), medium (10 - 20), high (21-30) and very high (31-40) based on the impact level of disease on health status. A CAT score over 10 suggests significant symptoms.  A worsening CAT score could be explained by an exacerbation, poor medication adherence, poor inhaler technique, or progression of COPD or comorbid conditions.  CAT MCID is 2 points  mMRC: mMRC (Modified Medical Research Council) Dyspnea Scale is used to assess the degree of baseline functional disability in patients of respiratory disease due to dyspnea. No minimal important difference is established. A decrease in score of 1 point or greater is considered a positive change.   Pulmonary Function Assessment:   Exercise Target Goals: Exercise Program Goal: Individual exercise prescription set using results from initial 6 min walk test and THRR while considering  patient's activity barriers and safety.   Exercise Prescription Goal: Initial exercise prescription builds to 30-45 minutes a day of aerobic activity, 2-3 days per week.  Home exercise guidelines will be given to patient during program as part of exercise prescription that the participant will acknowledge.  Education: Aerobic Exercise: - Group verbal and visual presentation on the components of exercise prescription. Introduces F.I.T.T principle from ACSM for exercise prescriptions.  Reviews F.I.T.T. principles of aerobic exercise including progression. Written material given at graduation.   Education: Resistance Exercise: - Group verbal and visual presentation on the components of exercise prescription. Introduces F.I.T.T principle from ACSM for exercise prescriptions  Reviews F.I.T.T. principles of  resistance exercise including progression. Written material given at graduation.    Education: Exercise & Equipment Safety: - Individual verbal instruction and demonstration of equipment use and safety with use of the equipment. Flowsheet Row Pulmonary Rehab from 09/10/2023 in [facility] Cardiac and Pulmonary Rehab  Date 09/10/23  Educator [facility]  Instruction Review Code 1- [employer] Understanding       Education: Exercise  Physiology & General Exercise Guidelines: - Group verbal and written instruction with models to review the exercise physiology of the cardiovascular system and associated critical values. Provides general exercise guidelines with specific guidelines to those with heart or lung disease.    Education: Flexibility, Balance, Mind/Body Relaxation: - Group verbal and visual presentation with interactive activity on the components of exercise prescription. Introduces F.I.T.T principle from ACSM for exercise prescriptions. Reviews F.I.T.T. principles of flexibility and balance exercise training including progression. Also discusses the mind body connection.  Reviews various relaxation techniques to help reduce and manage stress (i.e. Deep breathing, progressive muscle relaxation, and visualization). Balance handout provided to take home. Written material given at graduation.   Activity Barriers & Risk Stratification:  Activity Barriers & Cardiac Risk Stratification - 09/10/23 1456       Activity Barriers & Cardiac Risk Stratification   Activity Barriers None             6 Minute Walk:  6 Minute Walk     Row Name 09/10/23 1453         6 Minute Walk   Phase Initial     Distance 1520 feet     Walk Time 6 minutes     MPH 2.9     METS 4.6     RPE 10     Perceived Dyspnea  1     VO2 Peak 16     Symptoms No     Resting HR 86 bpm     Resting BP 124/70     Resting Oxygen Saturation  95 %     Exercise Oxygen Saturation  during 6 min walk 89 %     Max Ex. HR 123 bpm      Max Ex. BP 134/70     2 Minute Post BP 118/70       Interval HR   1 Minute HR 112     2 Minute HR 118     3 Minute HR 118     4 Minute HR 122     5 Minute HR 123     6 Minute HR 103     2 Minute Post HR 89     Interval Heart Rate? Yes       Interval Oxygen   Interval Oxygen? Yes     Baseline Oxygen Saturation % 95 %     1 Minute Oxygen Saturation % 89 %     1 Minute Liters of Oxygen 0 L     2 Minute Oxygen Saturation % 90 %     2 Minute Liters of Oxygen 0 L     3 Minute Oxygen Saturation % 90 %     3 Minute Liters of Oxygen 0 L     4 Minute Oxygen Saturation % 89 %     4 Minute Liters of Oxygen 0 L     5 Minute Oxygen Saturation % 89 %     5 Minute Liters of Oxygen 0 L     6 Minute Oxygen Saturation % 93 %     6 Minute Liters of Oxygen 0 L     2 Minute Post Oxygen Saturation % 94 %     2 Minute Post Liters of Oxygen 0 L             Oxygen Initial Assessment:  Oxygen Initial Assessment - 09/10/23 1501       Home Oxygen   Home Oxygen Device None  Sleep Oxygen Prescription None    Home Exercise Oxygen Prescription None    Home Resting Oxygen Prescription None    Compliance with Home Oxygen Use Yes      Initial 6 min Walk   Oxygen Used None      Program Oxygen Prescription   Program Oxygen Prescription None      Intervention   Short Term Goals To learn and demonstrate proper pursed lip breathing techniques or other breathing techniques.     Long  Term Goals Exhibits proper breathing techniques, such as pursed lip breathing or other method taught during program session             Oxygen Re-Evaluation:  Oxygen Re-Evaluation     Row Name 09/12/23 1109 10/22/23 0958 10/31/23 1101         Program Oxygen Prescription   Program Oxygen Prescription -- None None       Home Oxygen   Home Oxygen Device -- None None     Sleep Oxygen Prescription -- None None     Home Exercise Oxygen Prescription -- None None     Home Resting Oxygen Prescription -- None  None     Compliance with Home Oxygen Use -- Yes Yes       Goals/Expected Outcomes   Short Term Goals -- To learn and demonstrate proper pursed lip breathing techniques or other breathing techniques.  To learn and demonstrate proper pursed lip breathing techniques or other breathing techniques.      Long  Term Goals -- Exhibits proper breathing techniques, such as pursed lip breathing or other method taught during program session Exhibits proper breathing techniques, such as pursed lip breathing or other method taught during program session     Comments Reviewed PLB technique with pt.  Talked about how it works and it's importance in maintaining their exercise saturations. We reviewed PLB with Haidy today. She voiced understanding and reports that she will practice her PLB technique more during her ADL's. She also states that she does not currently own a pulse oximeter. We encouraged her to obtain one and she stated that she will look into it. We also talked to her about keeping her oxygen saturations above 88%. Reviewed PLB with Addalie today. She voiced understanding and says she would remember to use them during ADLs and exercise if needed. She still hasnt gotten a pulse ox for home but knows it would be helpful so today set goal to get one by 2025.     Goals/Expected Outcomes Short: Become more profiecient at using PLB. Long: Become independent at using PLB. Short: Become more profiecient at using PLB. Long: Become independent at using PLB. STG: Get a pulse ox for home by 2025. Become more profiecient at using PLB. Long: become independent at using PLB              Oxygen Discharge (Final Oxygen Re-Evaluation):  Oxygen Re-Evaluation - 10/31/23 1101       Program Oxygen Prescription   Program Oxygen Prescription None      Home Oxygen   Home Oxygen Device None    Sleep Oxygen Prescription None    Home Exercise Oxygen Prescription None    Home Resting Oxygen Prescription None    Compliance with  Home Oxygen Use Yes      Goals/Expected Outcomes   Short Term Goals To learn and demonstrate proper pursed lip breathing techniques or other breathing techniques.     Long  Term Goals Exhibits  proper breathing techniques, such as pursed lip breathing or other method taught during program session    Comments Reviewed PLB with Jinnie today. She voiced understanding and says she would remember to use them during ADLs and exercise if needed. She still hasnt gotten a pulse ox for home but knows it would be helpful so today set goal to get one by 2025.    Goals/Expected Outcomes STG: Get a pulse ox for home by 2025. Become more profiecient at using PLB. Long: become independent at using PLB             Initial Exercise Prescription:  Initial Exercise Prescription - 09/10/23 1500       NuStep   Level 3    SPM 80    Minutes 15    METs 4.6      Recumbant Elliptical   Level 3    RPM 50    Minutes 15    METs 4.6             Perform Capillary Blood Glucose checks as needed.  Exercise Prescription Changes:   Exercise Prescription Changes     Row Name 09/10/23 1400 09/26/23 1600 10/07/23 1000 10/24/23 1600 10/29/23 1100     Response to Exercise   Blood Pressure (Admit) 124/70 122/62 110/70 120/62 --   Blood Pressure (Exercise) 134/70 138/70 134/70 128/68 --   Blood Pressure (Exit) 118/70 110/60 122/62 100/56 --   Heart Rate (Admit) 86 bpm 85 bpm 90 bpm 94 bpm --   Heart Rate (Exercise) 123 bpm 154 bpm 145 bpm 145 bpm --   Heart Rate (Exit) 89 bpm 109 bpm 112 bpm 117 bpm --   Oxygen Saturation (Admit) 95 % 92 % 90 % 90 % --   Oxygen Saturation (Exercise) 89 % 92 % 87 % 88 % --   Oxygen Saturation (Exit) 94 % 94 % 92 % 90 % --   Rating of Perceived Exertion (Exercise) 10 12 13 12  --   Perceived Dyspnea (Exercise) 1 2 3 2  --   Symptoms none none none none --   Comments results firts two weeks of exercise -- -- --   Duration Progress to 30 minutes of  aerobic without  signs/symptoms of physical distress Progress to 30 minutes of  aerobic without signs/symptoms of physical distress Progress to 30 minutes of  aerobic without signs/symptoms of physical distress Progress to 30 minutes of  aerobic without signs/symptoms of physical distress --   Intensity THRR New THRR New THRR New THRR New --     Progression   Progression Continue to progress workloads to maintain intensity without signs/symptoms of physical distress. Continue to progress workloads to maintain intensity without signs/symptoms of physical distress. Continue to progress workloads to maintain intensity without signs/symptoms of physical distress. Continue to progress workloads to maintain intensity without signs/symptoms of physical distress. --   Average METs 4.6 4.09 4.6 4.77 --     Resistance Training   Training Prescription -- Yes Yes Yes --   Weight -- 4 4 4  --   Reps -- 10-15 10-15 10-15 --     Interval Training   Interval Training -- No No No --     Treadmill   MPH -- 3.2 3.2 3.2 --   Grade -- 3.5 3.5 3 --   Minutes -- 15 15 15  --   METs -- 4.99 4.99 4.77 --     NuStep   Level -- -- 3 4 --  Minutes -- -- 15 15 --     Recumbant Elliptical   Level -- 3 -- -- --   Minutes -- 15 -- -- --     Elliptical   Level -- 1 3.2 3.2 --   Speed -- 3.3 3.5 3.3 --   Minutes -- 15 15 15  --     Home Exercise Plan   Plans to continue exercise at -- -- -- -- Home (comment)  online cardio and strength classes and try wellzone   Frequency -- -- -- -- Add 3 additional days to program exercise sessions.   Initial Home Exercises Provided -- -- -- -- 10/29/23     Oxygen   Maintain Oxygen Saturation -- 88% or higher 88% or higher 88% or higher --    Row Name 11/07/23 1200 11/20/23 1600 12/05/23 0700 12/16/23 1700       Response to Exercise   Blood Pressure (Admit) 102/60 102/58 104/60 92/56    Blood Pressure (Exit) 102/60 108/60 110/64 96/56    Heart Rate (Admit) 90 bpm 88 bpm 92 bpm 91 bpm     Heart Rate (Exercise) 143 bpm 145 bpm 144 bpm 141 bpm    Heart Rate (Exit) 101 bpm 95 bpm 108 bpm 113 bpm    Oxygen Saturation (Admit) 96 % 96 % 97 % 96 %    Oxygen Saturation (Exercise) 89 % 89 % 91 % 91 %    Oxygen Saturation (Exit) 91 % 94 % 94 % 94 %    Rating of Perceived Exertion (Exercise) 12 13 12 12     Perceived Dyspnea (Exercise) 1 1 1 1     Symptoms none none none none    Duration Progress to 30 minutes of  aerobic without signs/symptoms of physical distress Progress to 30 minutes of  aerobic without signs/symptoms of physical distress Continue with 30 min of aerobic exercise without signs/symptoms of physical distress. Continue with 30 min of aerobic exercise without signs/symptoms of physical distress.    Intensity THRR New THRR New THRR unchanged THRR unchanged      Progression   Progression Continue to progress workloads to maintain intensity without signs/symptoms of physical distress. Continue to progress workloads to maintain intensity without signs/symptoms of physical distress. Continue to progress workloads to maintain intensity without signs/symptoms of physical distress. Continue to progress workloads to maintain intensity without signs/symptoms of physical distress.    Average METs 4.27 4.8 4.37 4.54      Resistance Training   Training Prescription Yes Yes Yes Yes    Weight 4 4 4 4     Reps 10-15 10-15 10-15 10-15      Interval Training   Interval Training No No No No      Treadmill   MPH 3.4 3.2 3.5 3.5    Grade 3 4.5 3.5 3.5    Minutes 15 15 15 15     METs 5.01 5.44 5.37 5.37      Elliptical   Level 3 4 3 4     Speed 3 2.7 3 2.5    Minutes 15 15 15 15     METs 3.4 -- 3.4 3      Home Exercise Plan   Plans to continue exercise at Home (comment)  online cardio and strength classes and try wellzone Home (comment)  online cardio and strength classes and try wellzone Home (comment)  online cardio and strength classes and try wellzone Home (comment)  online cardio  and strength classes and try wellzone  Frequency Add 3 additional days to program exercise sessions. Add 3 additional days to program exercise sessions. Add 3 additional days to program exercise sessions. Add 3 additional days to program exercise sessions.    Initial Home Exercises Provided 10/29/23 10/29/23 10/29/23 10/29/23      Oxygen   Maintain Oxygen Saturation 88% or higher 88% or higher 88% or higher 88% or higher             Exercise Comments:   Exercise Comments     Row Name 09/12/23 1108           Exercise Comments First full day of exercise!  Patient was oriented to gym and equipment including functions, settings, policies, and procedures.  Patient's individual exercise prescription and treatment plan were reviewed.  All starting workloads were established based on the results of the 6 minute walk test done at initial orientation visit.  The plan for exercise progression was also introduced and progression will be customized based on patient's performance and goals.                Exercise Goals and Review:   Exercise Goals     Row Name 09/10/23 1500             Exercise Goals   Increase Physical Activity Yes       Intervention Develop an individualized exercise prescription for aerobic and resistive training based on initial evaluation findings, risk stratification, comorbidities and participant's personal goals.;Provide advice, education, support and counseling about physical activity/exercise needs.       Expected Outcomes Long Term: Exercising regularly at least 3-5 days a week.;Long Term: Add in home exercise to make exercise part of routine and to increase amount of physical activity.;Short Term: Attend rehab on a regular basis to increase amount of physical activity.       Increase Strength and Stamina Yes       Intervention Provide advice, education, support and counseling about physical activity/exercise needs.;Develop an individualized exercise  prescription for aerobic and resistive training based on initial evaluation findings, risk stratification, comorbidities and participant's personal goals.       Expected Outcomes Long Term: Improve cardiorespiratory fitness, muscular endurance and strength as measured by increased METs and functional capacity ( );Short Term: Perform resistance training exercises routinely during rehab and add in resistance training at home;Short Term: Increase workloads from initial exercise prescription for resistance, speed, and METs.       Able to understand and use rate of perceived exertion (RPE) scale Yes       Intervention Provide education and explanation on how to use RPE scale       Expected Outcomes Long Term:  Able to use RPE to guide intensity level when exercising independently;Short Term: Able to use RPE daily in rehab to express subjective intensity level       Able to understand and use Dyspnea scale Yes       Intervention Provide education and explanation on how to use Dyspnea scale       Expected Outcomes Long Term: Able to use Dyspnea scale to guide intensity level when exercising independently;Short Term: Able to use Dyspnea scale daily in rehab to express subjective sense of shortness of breath during exertion       Knowledge and understanding of Target Heart Rate Range (THRR) Yes       Intervention Provide education and explanation of THRR including how the numbers were predicted and where they are located for reference  Expected Outcomes Long Term: Able to use THRR to govern intensity when exercising independently;Short Term: Able to use daily as guideline for intensity in rehab;Short Term: Able to state/look up THRR       Able to check pulse independently Yes       Intervention Review the importance of being able to check your own pulse for safety during independent exercise;Provide education and demonstration on how to check pulse in carotid and radial arteries.       Expected Outcomes  Long Term: Able to check pulse independently and accurately;Short Term: Able to explain why pulse checking is important during independent exercise       Understanding of Exercise Prescription Yes       Intervention Provide education, explanation, and written materials on patient's individual exercise prescription       Expected Outcomes Long Term: Able to explain home exercise prescription to exercise independently;Short Term: Able to explain program exercise prescription                Exercise Goals Re-Evaluation :  Exercise Goals Re-Evaluation     Row Name 09/12/23 1108 09/26/23 1632 10/07/23 1054 10/24/23 1602 10/29/23 1134     Exercise Goal Re-Evaluation   Exercise Goals Review Able to understand and use rate of perceived exertion (RPE) scale;Able to understand and use Dyspnea scale;Knowledge and understanding of Target Heart Rate Range (THRR);Understanding of Exercise Prescription Increase Physical Activity;Increase Strength and Stamina;Understanding of Exercise Prescription Increase Physical Activity;Increase Strength and Stamina;Understanding of Exercise Prescription Increase Physical Activity;Increase Strength and Stamina;Understanding of Exercise Prescription Able to understand and use Dyspnea scale;Increase Physical Activity;Understanding of Exercise Prescription;Increase Strength and Stamina;Knowledge and understanding of Target Heart Rate Range (THRR);Able to check pulse independently;Able to understand and use rate of perceived exertion (RPE) scale   Comments Reviewed RPE and dyspnea scale, THR and program prescription with pt today.  Pt voiced understanding and was given a copy of goals to take home. Ardell is off to a good start in the program. She has only attended 3 sessions since her initial orientation to the program. In those 3 sessions she was able to increase her treadmill workload up to a speed of 3.2 mph and an incline of 3.5%. We will continue to monitor her progress in the  program. Jaelee continues to do well in the program. She has been able to maintain her workload on the treadmill at a speed of 3. and a grade of 3.5%. She has also been able to increase her level on the elliptical from level 1 to level 3. We will continue to monitor her progress in the program. Mariposa is doing well in rehab. She has been able to increase her level on the T4 nustep from level 3 to 4. She has also maintained her workloads on the elliptical, and treadmill. We will continue to encourage and record her progress in the program. Reviewed home exercise with pt today from 11:20am to 11:30am.  Pt plans add 3 additional days and do online cardio and strength classes, such as mat pilates and cardio dance, and try the wellzone for exercise.  Reviewed THR, pulse, RPE, sign and symptoms, pulse oximetery and when to call 911 or MD.  Also discussed weather considerations and indoor options.  Pt voiced understanding.   Expected Outcomes Short: Use RPE daily to regulate intensity. Long: Follow program prescription in THR. Short: Continue to follow current exercise prescription. Long: Continue exercise to improve strength and stamina. Short: Continue to follow current  exercise prescription. Long: Continue exercise to improve strength and stamina. Short: Increase treadmill and elliptical workloads when able. Long: Continue exercise to improve strength and stamina. Short: Add 3 additional days of cardio and strength classes for home exercise. Long: Continue to exercise independently.    Row Name 10/31/23 1105 11/07/23 1258 11/20/23 1610 12/05/23 0757 12/16/23 1707     Exercise Goal Re-Evaluation   Exercise Goals Review Able to understand and use Dyspnea scale;Increase Physical Activity;Understanding of Exercise Prescription;Increase Strength and Stamina Increase Physical Activity;Understanding of Exercise Prescription;Increase Strength and Stamina Increase Physical Activity;Understanding of Exercise  Prescription;Increase Strength and Stamina Increase Physical Activity;Understanding of Exercise Prescription;Increase Strength and Stamina Increase Physical Activity;Understanding of Exercise Prescription;Increase Strength and Stamina   Comments She is working out well here at rehab but still not much at home. Did talk about ways to be more independent at home and coming to wellness zone after rehab if needed. She feels that would be helpful as she doesnt like working out in the cold. Encouraged her to look for a few youtube videos that she likes so she could use them on cold days or days she cant make it to rehab or wellness zone. Jax continues to do well in rehab. She was able to maintain her workload on the treadmill, as well as her workload on the elliptical. We will continue to monitor and encourage her progress in the program. Dezarae is doing well in the program. She has continues to increase both her level on the treadmill and the elliptical. She recently increased her incline on the treadmill from 3% to 4.5%. She also increased her level on the elliptical from level 3 to 4. We will continue to monitor her progress in the program. Aulani continues to do well in the program. She recently increased her speed on the treadmill to 3.5 mph while maintaining an incline of 3.5%. She also continues to work at level 3 on the elliptical and uses 4 lb hand weights for resistance training. We will continue to monitor her progress in the program. Cassandra continues to do well in rehab. She has maintained her workload on the treadmill to a speed of 3.5 mph and incline of 3.5%. She increased her workload on the elliptical to level 4 at a speed of 2.5 mph. She is using 4 lb hand weights for resistance training. We will continue to monitor her progress in the program.   Expected Outcomes STG: Make sure to set herself up for success by visiitng the wellness zone and looking for youtube exercise videos to do at home. LTG: Exercise  independently and improve breathing Short: Continue to progressively increase treadmill and elliptical workload. Long: Continue exercise to improve strength and stamina. Short: Continue to progressively increase treadmill and elliptical workload. Long: Continue exercise to improve strength and stamina. Short: Increase to 5 lb hand weights for resistance training. Long: Continue exercise to improve strength and stamina. Short: Progressively increase treadmill workload and increase to 5 lb hand weights for resistance training. Long: Continue exercise to improve strength and stamina.            Discharge Exercise Prescription (Final Exercise Prescription Changes):  Exercise Prescription Changes - 12/16/23 1700       Response to Exercise   Blood Pressure (Admit) 92/56    Blood Pressure (Exit) 96/56    Heart Rate (Admit) 91 bpm    Heart Rate (Exercise) 141 bpm    Heart Rate (Exit) 113 bpm    Oxygen Saturation (  Admit) 96 %    Oxygen Saturation (Exercise) 91 %    Oxygen Saturation (Exit) 94 %    Rating of Perceived Exertion (Exercise) 12    Perceived Dyspnea (Exercise) 1    Symptoms none    Duration Continue with 30 min of aerobic exercise without signs/symptoms of physical distress.    Intensity THRR unchanged      Progression   Progression Continue to progress workloads to maintain intensity without signs/symptoms of physical distress.    Average METs 4.54      Resistance Training   Training Prescription Yes    Weight 4    Reps 10-15      Interval Training   Interval Training No      Treadmill   MPH 3.5    Grade 3.5    Minutes 15    METs 5.37      Elliptical   Level 4    Speed 2.5    Minutes 15    METs 3      Home Exercise Plan   Plans to continue exercise at Home (comment)   online cardio and strength classes and try wellzone   Frequency Add 3 additional days to program exercise sessions.    Initial Home Exercises Provided 10/29/23      Oxygen   Maintain Oxygen  Saturation 88% or higher             Nutrition:  Target Goals: Understanding of nutrition guidelines, daily intake of sodium 1500mg , cholesterol 200mg , calories 30% from fat and 7% or less from saturated fats, daily to have 5 or more servings of fruits and vegetables.  Education: All About Nutrition: -Group instruction provided by verbal, written material, interactive activities, discussions, models, and posters to present general guidelines for heart healthy nutrition including fat, fiber, MyPlate, the role of sodium in heart healthy nutrition, utilization of the nutrition label, and utilization of this knowledge for meal planning. Follow up email sent as well. Written material given at graduation.   Biometrics:  Pre Biometrics - 09/10/23 1459       Pre Biometrics   Height 5' 3.9" (1.623 m)    Weight 166 lb 11.2 oz (75.6 kg)    Waist Circumference 37.5 inches    Hip Circumference 42.5 inches    Waist to Hip Ratio 0.88 %    BMI (Calculated) 28.71    Single Leg Stand 9.53 seconds              Nutrition Therapy Plan and Nutrition Goals:   Nutrition Assessments:  MEDIFICTS Score Key: >=70 Need to make dietary changes  40-70 Heart Healthy Diet <= 40 Therapeutic Level Cholesterol Diet  Flowsheet Row Pulmonary Rehab from 09/10/2023 in [facility] Cardiac and Pulmonary Rehab  Picture Your Plate Total Score on Admission 49      Picture Your Plate Scores: <57 Unhealthy dietary pattern with much room for improvement. 41-50 Dietary pattern unlikely to meet recommendations for good health and room for improvement. 51-60 More healthful dietary pattern, with some room for improvement.  >60 Healthy dietary pattern, although there may be some specific behaviors that could be improved.   Nutrition Goals Re-Evaluation:  Nutrition Goals Re-Evaluation     Row Name 10/22/23 0951 10/31/23 1127 12/05/23 1137         Goals   Nutrition Goal Zniyah states that she feels she is doing well  with her diet at this time. She states that she is a little overwhemed with her  number of appointments, but would be open to meeting with the RD in the future. Chenille reports she is working on increasing protein after being precribed a GLP-1 mounjaro. Discussed the importance or protein and demonstrated ways to help meet protein goals with smaller portions. She is drinking a protein shake, muscle milk 42g whey protein. Suggested she could spilt the shake in two and drink each half at different times if needed. She liked that idea and will try keeping snacks with protein shake smaller to avoid feeling overly full at times. She is reading labels and reducing sodium and saturated fat. Provided target goal of less than 1500mg . Reviewed a few important concepts like making nutrient dense balnaced plates with protein and complex carbs. Provided a handout on meal and snack ideas for nutreint dense low caloire choices She is still following dietary recommendations while on GLP-1 mounjaro. Increase in protein with smaller quantities. She has increased in Mounjaro dosage and has alot of stomach pain on her first night after dosage increase. Encouraged her to communicate with her prescriber but that some side effects like stomach pain are common, especially after dosage increases. She reports she has experieces a few instances of stomach pain when increasing onto previous dosages.     Comment Short: Meet with RD in future. Long: Continue to make healthy dietary choices. STG: eat ~75-90g Pro daily and choose low caloire dense foods to support weight loss. LTG: follow heart healthy diet and maintain happy healthy weight STG: tolerate mounjaro, meet protein goals. LTG: follow heart healthy diet and maintain happy healthy weight              Nutrition Goals Discharge (Final Nutrition Goals Re-Evaluation):  Nutrition Goals Re-Evaluation - 12/05/23 1137       Goals   Nutrition Goal She is still following dietary  recommendations while on GLP-1 mounjaro. Increase in protein with smaller quantities. She has increased in Mounjaro dosage and has alot of stomach pain on her first night after dosage increase. Encouraged her to communicate with her prescriber but that some side effects like stomach pain are common, especially after dosage increases. She reports she has experieces a few instances of stomach pain when increasing onto previous dosages.    Comment STG: tolerate mounjaro, meet protein goals. LTG: follow heart healthy diet and maintain happy healthy weight             Psychosocial: Target Goals: Acknowledge presence or absence of significant depression and/or stress, maximize coping skills, provide positive support system. Participant is able to verbalize types and ability to use techniques and skills needed for reducing stress and depression.   Education: Stress, Anxiety, and Depression - Group verbal and visual presentation to define topics covered.  Reviews how body is impacted by stress, anxiety, and depression.  Also discusses healthy ways to reduce stress and to treat/manage anxiety and depression.  Written material given at graduation.   Education: Sleep Hygiene -Provides group verbal and written instruction about how sleep can affect your health.  Define sleep hygiene, discuss sleep cycles and impact of sleep habits. Review good sleep hygiene tips.    Initial Review & Psychosocial Screening:  Initial Psych Review & Screening - 09/05/23 1012       Family Dynamics   Good Support System? Yes   Mom and Dad,     Barriers   Psychosocial barriers to participate in program There are no identifiable barriers or psychosocial needs.      Screening Interventions  Interventions To provide support and resources with identified psychosocial needs;Provide feedback about the scores to participant;Encouraged to exercise    Expected Outcomes Short Term goal: Utilizing psychosocial counselor, staff  and physician to assist with identification of specific Stressors or current issues interfering with healing process. Setting desired goal for each stressor or current issue identified.;Long Term Goal: Stressors or current issues are controlled or eliminated.;Short Term goal: Identification and review with participant of any Quality of Life or Depression concerns found by scoring the questionnaire.;Long Term goal: The participant improves quality of Life and PHQ9 Scores as seen by post scores and/or verbalization of changes             Quality of Life Scores:  Scores of 19 and below usually indicate a poorer quality of life in these areas.  A difference of  2-3 points is a clinically meaningful difference.  A difference of 2-3 points in the total score of the Quality of Life Index has been associated with significant improvement in overall quality of life, self-image, physical symptoms, and general health in studies assessing change in quality of life.  PHQ-9: Review Flowsheet       09/10/2023  Depression screen PHQ 2/9  Decreased Interest 0  Down, Depressed, Hopeless 0  PHQ - 2 Score 0  Altered sleeping 1  Tired, decreased energy 1  Change in appetite 0  Feeling bad or failure about yourself  0  Trouble concentrating 0  Moving slowly or fidgety/restless 0  Suicidal thoughts 0  PHQ-9 Score 2  Difficult doing work/chores Not difficult at all   Interpretation of Total Score  Total Score Depression Severity:  1-4 = Minimal depression, 5-9 = Mild depression, 10-14 = Moderate depression, 15-19 = Moderately severe depression, 20-27 = Severe depression   Psychosocial Evaluation and Intervention:  Psychosocial Evaluation - 09/05/23 1026       Psychosocial Evaluation & Interventions   Interventions Encouraged to exercise with the program and follow exercise prescription    Comments Adrena has no barriers to attending the program.  She did have anxiety about exercise after her HR went up  to 155 when she got up to move in the hospital. TAlking with her physician ,she feels less anxiety being sent to the PR program and being in a clinical place to exercise. She has a new diagnosis of PUlmonary hypertension and is working with her doctors to do all they can to keep it controlled, and to understand how to manage with the disease.  She has support from her Mom and Dad. She lives with her 2 children.  She is ready to get started with the program.    Expected Outcomes STG attends all scheduled sessions, continues to work on reducing any anxiety about exercising with Pul HTN. LTG  Continues exercise progression after discharge, continue to work with her physician for any anxiety control    Continue Psychosocial Services  Follow up required by staff             Psychosocial Re-Evaluation:  Psychosocial Re-Evaluation     Row Name 10/22/23 0948 10/31/23 1110 12/05/23 1134         Psychosocial Re-Evaluation   Current issues with -- None Identified None Identified     Comments Kamaiyah reports no major stressors at this time. She enjoys scrolling on tik tok for stress relief. She also likes to volunteer at her church when they need her help. She has a good support system made up by her  parents, family, and church. She states that she has been sleeping better since rehab but is still waking up some throughout the night. She expressed their is no stressors or depression or anxeity. She reports the exercise she is getting from rehab has significantly improved her sleep and mood as well. She has good support system with friends, family and church. She likes to watch Dena Billet and relax. Encouraged her to remember exercise as a good stress reliever She reports no anxiety, depression or stressors. She has enjoyed exercising fore stress relief and mood boost. She has good support system with family, friends and church.     Expected Outcomes Short: Continue to attend pulmonary rehab for stress relief. Long:  Continue to maintain positive outlook. STG: Continue to attend pulmonary rehab for stress relief. LTG: Continue to use support systems to maintain positive outlook on health outcomes and daily life STG: continue pulmonary rehab and exercise indeprendently for stress relief. LTG: continue to use support systemn and maintain positive outlook on health and daily life     Interventions Encouraged to attend Pulmonary Rehabilitation for the exercise Encouraged to attend Pulmonary Rehabilitation for the exercise Encouraged to attend Pulmonary Rehabilitation for the exercise     Continue Psychosocial Services  Follow up required by staff Follow up required by staff Follow up required by staff              Psychosocial Discharge (Final Psychosocial Re-Evaluation):  Psychosocial Re-Evaluation - 12/05/23 1134       Psychosocial Re-Evaluation   Current issues with None Identified    Comments She reports no anxiety, depression or stressors. She has enjoyed exercising fore stress relief and mood boost. She has good support system with family, friends and church.    Expected Outcomes STG: continue pulmonary rehab and exercise indeprendently for stress relief. LTG: continue to use support systemn and maintain positive outlook on health and daily life    Interventions Encouraged to attend Pulmonary Rehabilitation for the exercise    Continue Psychosocial Services  Follow up required by staff             Education: Education Goals: Education classes will be provided on a weekly basis, covering required topics. Participant will state understanding/return demonstration of topics presented.  Learning Barriers/Preferences:  Learning Barriers/Preferences - 09/05/23 1013       Learning Barriers/Preferences   Learning Barriers None    Learning Preferences None             General Pulmonary Education Topics:  Infection Prevention: - Provides verbal and written material to individual with  discussion of infection control including proper hand washing and proper equipment cleaning during exercise session. Flowsheet Row Pulmonary Rehab from 09/10/2023 in [facility] Cardiac and Pulmonary Rehab  Date 09/10/23  Educator [facility]  Instruction Review Code 1- Verbalizes Understanding       Falls Prevention: - Provides verbal and written material to individual with discussion of falls prevention and safety. Flowsheet Row Pulmonary Rehab from 09/10/2023 in [facility] Cardiac and Pulmonary Rehab  Date 09/10/23  Educator [facility]  Instruction Review Code 1- Verbalizes Understanding       Chronic Lung Disease Review: - Group verbal instruction with posters, models, PowerPoint presentations and videos,  to review new updates, new respiratory medications, new advancements in procedures and treatments. Providing information on websites and "800" numbers for continued self-education. Includes information about supplement oxygen, available portable oxygen systems, continuous and intermittent flow rates, oxygen safety, concentrators, and Medicare reimbursement for  oxygen. Explanation of Pulmonary Drugs, including class, frequency, complications, importance of spacers, rinsing mouth after steroid MDI's, and proper cleaning methods for nebulizers. Review of basic lung anatomy and physiology related to function, structure, and complications of lung disease. Review of risk factors. Discussion about methods for diagnosing sleep apnea and types of masks and machines for OSA. Includes a review of the use of types of environmental controls: home humidity, furnaces, filters, dust mite/pet prevention, HEPA vacuums. Discussion about weather changes, air quality and the benefits of nasal washing. Instruction on Warning signs, infection symptoms, calling MD promptly, preventive modes, and value of vaccinations. Review of effective airway clearance, coughing and/or vibration techniques. Emphasizing that all should Create an Action Plan.  Written material given at graduation.   AED/CPR: - Group verbal and written instruction with the use of models to demonstrate the basic use of the AED with the basic ABC's of resuscitation.    Anatomy and Cardiac Procedures: - Group verbal and visual presentation and models provide information about basic cardiac anatomy and function. Reviews the testing methods done to diagnose heart disease and the outcomes of the test results. Describes the treatment choices: Medical Management, Angioplasty, or Coronary Bypass Surgery for treating various heart conditions including Myocardial Infarction, Angina, Valve Disease, and Cardiac Arrhythmias.  Written material given at graduation.   Medication Safety: - Group verbal and visual instruction to review commonly prescribed medications for heart and lung disease. Reviews the medication, class of the drug, and side effects. Includes the steps to properly store meds and maintain the prescription regimen.  Written material given at graduation.   Other: -Provides group and verbal instruction on various topics (see comments)   Knowledge Questionnaire Score:    Core Components/Risk Factors/Patient Goals at Admission:  Personal Goals and Risk Factors at Admission - 09/05/23 1016       Core Components/Risk Factors/Patient Goals on Admission    Weight Management Yes    Intervention Weight Management: Develop a combined nutrition and exercise program designed to reach desired caloric intake, while maintaining appropriate intake of nutrient and fiber, sodium and fats, and appropriate energy expenditure required for the weight goal.;Weight Management: Provide education and appropriate resources to help participant work on and attain dietary goals.    Admit Weight 164 lb (74.4 kg)   has lost 7 lbs   Goal Weight: Short Term 163 lb (73.9 kg)    Goal Weight: Long Term 135 lb (61.2 kg)    Expected Outcomes Short Term: Continue to assess and modify interventions  until short term weight is achieved;Long Term: Adherence to nutrition and physical activity/exercise program aimed toward attainment of established weight goal;Weight Loss: Understanding of general recommendations for a balanced deficit meal plan, which promotes 1-2 lb weight loss per week and includes a negative energy balance of [phone] kcal/d    Improve shortness of breath with ADL's Yes    Intervention Provide education, individualized exercise plan and daily activity instruction to help decrease symptoms of SOB with activities of daily living.    Expected Outcomes Short Term: Improve cardiorespiratory fitness to achieve a reduction of symptoms when performing ADLs;Long Term: Be able to perform more ADLs without symptoms or delay the onset of symptoms    Heart Failure Yes    Intervention Provide a combined exercise and nutrition program that is supplemented with education, support and counseling about heart failure. Directed toward relieving symptoms such as shortness of breath, decreased exercise tolerance, and extremity edema.    Expected Outcomes Improve  functional capacity of life;Short term: Attendance in program 2-3 days a week with increased exercise capacity. Reported lower sodium intake. Reported increased fruit and vegetable intake. Reports medication compliance.;Short term: Daily weights obtained and reported for increase. Utilizing diuretic protocols set by physician.;Long term: Adoption of self-care skills and reduction of barriers for early signs and symptoms recognition and intervention leading to self-care maintenance.    Lipids Yes    Intervention Provide education and support for participant on nutrition & aerobic/resistive exercise along with prescribed medications to achieve LDL 70mg , HDL >40mg .    Expected Outcomes Short Term: Participant states understanding of desired cholesterol values and is compliant with medications prescribed. Participant is following exercise prescription  and nutrition guidelines.;Long Term: Cholesterol controlled with medications as prescribed, with individualized exercise RX and with personalized nutrition plan. Value goals: LDL < 70mg , HDL > 40 mg.             Education:Diabetes - Individual verbal and written instruction to review signs/symptoms of diabetes, desired ranges of glucose level fasting, after meals and with exercise. Acknowledge that pre and post exercise glucose checks will be done for 3 sessions at entry of program.   Know Your Numbers and Heart Failure: - Group verbal and visual instruction to discuss disease risk factors for cardiac and pulmonary disease and treatment options.  Reviews associated critical values for Overweight/Obesity, Hypertension, Cholesterol, and Diabetes.  Discusses basics of heart failure: signs/symptoms and treatments.  Introduces Heart Failure Zone chart for action plan for heart failure.  Written material given at graduation.   Core Components/Risk Factors/Patient Goals Review:   Goals and Risk Factor Review     Row Name 10/22/23 4401 10/31/23 1120 12/05/23 1141         Core Components/Risk Factors/Patient Goals Review   Personal Goals Review Weight Management/Obesity;Improve shortness of breath with ADL's;Hypertension Weight Management/Obesity;Improve shortness of breath with ADL's Weight Management/Obesity;Improve shortness of breath with ADL's     Review Terisha states that she is doing well with maintaining her weight around 160 lbs. She would like to lose a little weight with a weight goal of 135 lb. She states that she does own a BP cuff at home and is checking it occasionally. She reports that her BP has stayed within normal ranges. Malaysha states that her SOB has not improved too much lately. However, we revied PLB techniques and encouraged her to practice PLB during both exercise and during ADL's. She is happy with her weight loss progress so far. She is on mounjaro and has been tolerating it  well, with a dose increase coming up in the week or so. She reports she was ~170lbs in september is down to 157.3lbs today. Commended her on her progress and reminder her than all hnages today must be sustainable if she is to reach her happy body weight and maintain it. She understands and is working on making these chnages more of a lifestyle for her. She is motivated to continue to progress since her SOB has been much imporved when doing ADLs. She is happy with mounjaro and the weight loss progress she has made so far. Commended and encouraged her to continue following healthy eating hbaits while meeting nutrtional guidelines with mounjaro. She reports her breathing is much better since starting the program during exericse and ADLs. She wants to set goal to get a pulse ox this month     Expected Outcomes Short: Continue to work towards weight goal through diet and exercise. Long: Continue to  monitor lifestyle risk factors. SHG: continue to work on weight loss and increased exercise workload. LTG: reach happy body weight and maintain lifestyle changes for better health and breathing STG: get pulse ox in the next few weeks, tolerate mounjaro. LTG: reach happy healthy body weight and maintain lifestyle changes for better health and breathing              Core Components/Risk Factors/Patient Goals at Discharge (Final Review):   Goals and Risk Factor Review - 12/05/23 1141       Core Components/Risk Factors/Patient Goals Review   Personal Goals Review Weight Management/Obesity;Improve shortness of breath with ADL's    Review She is happy with mounjaro and the weight loss progress she has made so far. Commended and encouraged her to continue following healthy eating hbaits while meeting nutrtional guidelines with mounjaro. She reports her breathing is much better since starting the program during exericse and ADLs. She wants to set goal to get a pulse ox this month    Expected Outcomes STG: get pulse ox  in the next few weeks, tolerate mounjaro. LTG: reach happy healthy body weight and maintain lifestyle changes for better health and breathing             ITP Comments:  ITP Comments     Row Name 09/05/23 1033 09/10/23 1453 09/12/23 1107 09/25/23 1007 10/23/23 1114   ITP Comments Virtual orientation call completed today. shehas an appointment on Date: 09/10/2023  for EP eval and gym Orientation.  Documentation of diagnosis [facility] be found in [facility] Date: 08/30/2023 . Completed and gym orientation. Initial ITP created and sent for review to Dr. Jinny Sanders, Medical Director. First full day of exercise!  Patient was oriented to gym and equipment including functions, settings, policies, and procedures.  Patient's individual exercise prescription and treatment plan were reviewed.  All starting workloads were established based on the results of the 6 minute walk test done at initial orientation visit.  The plan for exercise progression was also introduced and progression will be customized based on patient's performance and goals. 30 Day review completed. Medical Director ITP review done, changes made as directed, and signed approval by Medical Director.    new to program 30 Day review completed. Medical Director ITP review done, changes made as directed, and signed approval by Medical Director.    Row Name 11/20/23 1434 12/18/23 1143         ITP Comments 30 Day review completed. Medical Director ITP review done, changes made as directed, and signed approval by Medical Director. 30 Day review completed. Medical Director ITP review done, changes made as directed, and signed approval by Medical Director.               Comments:

## 2023-12-19 ENCOUNTER — Telehealth: Payer: Self-pay

## 2023-12-19 ENCOUNTER — Other Ambulatory Visit (HOSPITAL_COMMUNITY): Payer: Self-pay

## 2023-12-19 ENCOUNTER — Encounter: Payer: Self-pay | Admitting: Oncology

## 2023-12-19 ENCOUNTER — Encounter: Payer: Managed Care, Other (non HMO) | Admitting: *Deleted

## 2023-12-19 DIAGNOSIS — I272 Pulmonary hypertension, unspecified: Secondary | ICD-10-CM

## 2023-12-19 NOTE — Telephone Encounter (Signed)
Patient Advocate Encounter  Patient contacted office regarding prior authorization for Farxiga. Spoke with pharmacy to confirm secondary insurance is requiring prior auth. Submitted 12/19/23.  CMM Key [id number]  Burnell Blanks, CPhT Rx Patient Advocate Phone: [phone]

## 2023-12-19 NOTE — Progress Notes (Signed)
Daily Session Note  Patient Details  Name: [name] MRN: [id number] Date of Birth: 11-15-1975 Referring Provider:   Flowsheet Row Pulmonary Rehab from 09/10/2023 in [facility] Cardiac and Pulmonary Rehab  Referring Provider Dr. Arvilla Meres       Encounter Date: 12/19/2023  Check In:  Session Check In - 12/19/23 1119       Check-In   Supervising physician immediately available to respond to emergencies See telemetry face sheet for immediately available ER MD    Location [facility]-Cardiac & Pulmonary Rehab    Staff Present Cora Collum, RN, BSN, CCRP;Joseph Hood RCP,RRT,BSRT;Noah Tickle, [place], Exercise Physiologist;Meredith Jewel Baize RN,BSN    Virtual Visit No    Medication changes reported     No    Fall or balance concerns reported    No    Warm-up and Cool-down Performed on first and last piece of equipment    Resistance Training Performed Yes    VAD Patient? No    PAD/SET Patient? No      Pain Assessment   Currently in Pain? No/denies                Social History   Tobacco Use  Smoking Status Former   Current packs/day: 0.00   Types: Cigarettes   Quit date: 10/21/2019   Years since quitting: 4.1  Smokeless Tobacco Never    Goals Met:  Proper associated with RPD/PD & O2 Sat Independence with exercise equipment Exercise tolerated well No report of concerns or symptoms today  Goals Unmet:  Not Applicable  Comments: Pt able to follow exercise prescription today without complaint.  Will continue to monitor for progression.    Dr. Bethann Punches is Medical Director for [facility] Cardiac Rehabilitation.  Dr. Vida Rigger is Medical Director for [facility] Pulmonary Rehabilitation.

## 2023-12-20 ENCOUNTER — Other Ambulatory Visit (HOSPITAL_COMMUNITY): Payer: Self-pay

## 2023-12-20 NOTE — Telephone Encounter (Signed)
Prior auth for [name] has been approved from 12/20/2023 to 12/18/2024. Test billing returns refill too soon rejection, though copay should be $4 after both insurances are applied at this time.

## 2023-12-23 ENCOUNTER — Ambulatory Visit: Payer: Managed Care, Other (non HMO)

## 2023-12-24 ENCOUNTER — Encounter: Payer: Managed Care, Other (non HMO) | Admitting: *Deleted

## 2023-12-24 DIAGNOSIS — I272 Pulmonary hypertension, unspecified: Secondary | ICD-10-CM

## 2023-12-24 NOTE — Progress Notes (Signed)
 Daily Session Note  Patient Details  Name: [name] MRN: [id number] Date of Birth: [birth year] Referring Provider:   Flowsheet Row Pulmonary Rehab from 09/10/2023 in [facility] Cardiac and Pulmonary Rehab  Referring Provider Dr. Arvilla Meres       Encounter Date: 12/24/2023  Check In:  Session Check In - 12/24/23 0919       Check-In   Supervising physician immediately available to respond to emergencies See telemetry face sheet for immediately available ER MD    Location [facility]-Cardiac & Pulmonary Rehab    Staff Present Cora Collum, RN, BSN, CCRP;Noah Tickle, BS, Exercise Physiologist;Maxon Conetta BS, Exercise Physiologist;Margaret Best, MS, Exercise Physiologist;Joseph Reino Kent RCP,RRT,BSRT    Virtual Visit No    Medication changes reported     No    Fall or balance concerns reported    No    Warm-up and Cool-down Performed on first and last piece of equipment    Resistance Training Performed Yes    VAD Patient? No    PAD/SET Patient? No      Pain Assessment   Currently in Pain? No/denies                Social History   Tobacco Use  Smoking Status Former   Current packs/day: 0.00   Types: Cigarettes   Quit date: 10/21/2019   Years since quitting: 4.1  Smokeless Tobacco Never    Goals Met:  Proper associated with RPD/PD & O2 Sat Independence with exercise equipment Exercise tolerated well No report of concerns or symptoms today  Goals Unmet:  Not Applicable  Comments: Pt able to follow exercise prescription today without complaint.  Will continue to monitor for progression.    Dr. Bethann Punches is Medical Director for [facility] Cardiac Rehabilitation.  Dr. Vida Rigger is Medical Director for [facility] Pulmonary Rehabilitation.

## 2023-12-30 ENCOUNTER — Ambulatory Visit: Payer: Managed Care, Other (non HMO)

## 2023-12-30 ENCOUNTER — Telehealth: Payer: Self-pay

## 2023-12-30 MED ORDER — MOUNJARO 10 MG/0.5ML ~~LOC~~ SOAJ: 10.0000 mg | SUBCUTANEOUS | 0 refills | Status: DC

## 2023-12-30 NOTE — Telephone Encounter (Signed)
 Patient called in requesting Mounjaro refill- will forward to PharmD.

## 2023-12-31 ENCOUNTER — Encounter: Payer: Managed Care, Other (non HMO) | Admitting: *Deleted

## 2023-12-31 VITALS — Ht 63.9 in | Wt 141.3 lb

## 2023-12-31 DIAGNOSIS — I272 Pulmonary hypertension, unspecified: Secondary | ICD-10-CM

## 2023-12-31 NOTE — Patient Instructions (Signed)
 Discharge Patient Instructions  Patient Details  Name: [name] MRN: [id number] Date of Birth: 1976/06/08 Referring Provider:  Larena Glassman, PA   Number of Visits: 83  Reason for Discharge:  Patient reached a stable level of exercise. Patient independent in their exercise. Patient has met program and personal goals.  Diagnosis:  Pulmonary hypertension (HCC)  Initial Exercise Prescription:  Initial Exercise Prescription - 09/10/23 1500       NuStep   Level 3    SPM 80    Minutes 15    METs 4.6      Recumbant Elliptical   Level 3    RPM 50    Minutes 15    METs 4.6             Discharge Exercise Prescription (Final Exercise Prescription Changes):  Exercise Prescription Changes - 12/16/23 1700       Response to Exercise   Blood Pressure (Admit) 92/56    Blood Pressure (Exit) 96/56    Heart Rate (Admit) 91 bpm    Heart Rate (Exercise) 141 bpm    Heart Rate (Exit) 113 bpm    Oxygen Saturation (Admit) 96 %    Oxygen Saturation (Exercise) 91 %    Oxygen Saturation (Exit) 94 %    Rating of Perceived Exertion (Exercise) 12    Perceived Dyspnea (Exercise) 1    Symptoms none    Duration Continue with 30 min of aerobic exercise without signs/symptoms of physical distress.    Intensity THRR unchanged      Progression   Progression Continue to progress workloads to maintain intensity without signs/symptoms of physical distress.    Average METs 4.54      Resistance Training   Training Prescription Yes    Weight 4    Reps 10-15      Interval Training   Interval Training No      Treadmill   MPH 3.5    Grade 3.5    Minutes 15    METs 5.37      Elliptical   Level 4    Speed 2.5    Minutes 15    METs 3      Home Exercise Plan   Plans to continue exercise at Home (comment)   online cardio and strength classes and try wellzone   Frequency Add 3 additional days to program exercise sessions.    Initial Home Exercises Provided 10/29/23      Oxygen    Maintain Oxygen Saturation 88% or higher             Functional Capacity:  6 Minute Walk     Row Name 09/10/23 1453 12/31/23 1121       6 Minute Walk   Phase Initial Discharge    Distance 1520 feet 2020 feet    Distance % Change -- 32.9 %    Distance Feet Change -- 500 ft    Walk Time 6 minutes 6 minutes    # of Rest Breaks -- 0    MPH 2.9 3.83    METS 4.6 5.54    RPE 10 12    Perceived Dyspnea  1 1    VO2 Peak 16 19.38    Symptoms No No    Resting HR 86 bpm 86 bpm    Resting BP 124/70 92/62    Resting Oxygen Saturation  95 % 98 %    Exercise Oxygen Saturation  during 6 min walk 89 % 88 %  Max Ex. HR 123 bpm 116 bpm    Max Ex. BP 134/70 128/60    2 Minute Post BP 118/70 108/56      Interval HR   1 Minute HR 112 116    2 Minute HR 118 92    3 Minute HR 118 97    4 Minute HR 122 106    5 Minute HR 123 94    6 Minute HR 103 96    2 Minute Post HR 89 103    Interval Heart Rate? Yes Yes      Interval Oxygen   Interval Oxygen? Yes Yes    Baseline Oxygen Saturation % 95 % 98 %    1 Minute Oxygen Saturation % 89 % 94 %    1 Minute Liters of Oxygen 0 L 0 L  RA    2 Minute Oxygen Saturation % 90 % 92 %    2 Minute Liters of Oxygen 0 L 0 L    3 Minute Oxygen Saturation % 90 % 90 %    3 Minute Liters of Oxygen 0 L 0 L    4 Minute Oxygen Saturation % 89 % 89 %    4 Minute Liters of Oxygen 0 L 0 L    5 Minute Oxygen Saturation % 89 % 89 %    5 Minute Liters of Oxygen 0 L 0 L    6 Minute Oxygen Saturation % 93 % 88 %    6 Minute Liters of Oxygen 0 L 0 L    2 Minute Post Oxygen Saturation % 94 % 98 %    2 Minute Post Liters of Oxygen 0 L 0 L            Nutrition & Weight - Outcomes:  Pre Biometrics - 09/10/23 1459       Pre Biometrics   Height 5' 3.9" (1.623 m)    Weight 166 lb 11.2 oz (75.6 kg)    Waist Circumference 37.5 inches    Hip Circumference 42.5 inches    Waist to Hip Ratio 0.88 %    BMI (Calculated) 28.71    Single Leg Stand 9.53 seconds              Post Biometrics - 12/31/23 1124        Post  Biometrics   Height 5' 3.9" (1.623 m)    Weight 141 lb 4.8 oz (64.1 kg)    Waist Circumference 38 inches    Hip Circumference 33.5 inches    Waist to Hip Ratio 1.13 %    BMI (Calculated) 24.33    Single Leg Stand 23.8 seconds

## 2023-12-31 NOTE — Progress Notes (Signed)
 Daily Session Note  Patient Details  Name: [name] MRN: [id number] Date of Birth: 27-Apr-1976 Referring Provider:   Flowsheet Row Pulmonary Rehab from 09/10/2023 in [facility] Cardiac and Pulmonary Rehab  Referring Provider Dr. Arvilla Meres       Encounter Date: 12/31/2023  Check In:  Session Check In - 12/31/23 1117       Check-In   Supervising physician immediately available to respond to emergencies See telemetry face sheet for immediately available ER MD    Location [facility]-Cardiac & Pulmonary Rehab    Staff Present Rory Percy, MS, Exercise Physiologist;Shaquasha Gerstel, RN, BSN, CCRP;Meredith Craven RN,BSN;Maxon [employer], Exercise Physiologist;Noah Tickle, BS, Exercise Physiologist    Virtual Visit No    Medication changes reported     No    Fall or balance concerns reported    No    Warm-up and Cool-down Performed on first and last piece of equipment    Resistance Training Performed Yes    VAD Patient? No    PAD/SET Patient? No      Pain Assessment   Currently in Pain? No/denies                Social History   Tobacco Use  Smoking Status Former   Current packs/day: 0.00   Types: Cigarettes   Quit date: 10/21/2019   Years since quitting: 4.1  Smokeless Tobacco Never    Goals Met:  Proper associated with RPD/PD & O2 Sat Independence with exercise equipment Exercise tolerated well No report of concerns or symptoms today  Goals Unmet:  Not Applicable  Comments: Pt able to follow exercise prescription today without complaint.  Will continue to monitor for progression.    Dr. Bethann Punches is Medical Director for [facility] Cardiac Rehabilitation.  Dr. Vida Rigger is Medical Director for [facility] Pulmonary Rehabilitation.

## 2024-01-02 ENCOUNTER — Encounter: Payer: Managed Care, Other (non HMO) | Admitting: *Deleted

## 2024-01-02 DIAGNOSIS — I272 Pulmonary hypertension, unspecified: Secondary | ICD-10-CM | POA: Diagnosis not present

## 2024-01-02 NOTE — Progress Notes (Signed)
 Daily Session Note  Patient Details  Name: [name] MRN: [id number] Date of Birth: February 26, 1976 Referring Provider:   Flowsheet Row Pulmonary Rehab from 09/10/2023 in [facility] Cardiac and Pulmonary Rehab  Referring Provider Dr. Arvilla Meres       Encounter Date: 01/02/2024  Check In:  Session Check In - 01/02/24 1115       Check-In   Supervising physician immediately available to respond to emergencies See telemetry face sheet for immediately available ER MD    Location [facility]-Cardiac & Pulmonary Rehab    Staff Present Susann Givens RN,BSN;Joseph Reino Kent RCP,RRT,BSRT;Noah Tickle, [place], Exercise Physiologist;Jason Wallace Cullens RDN,LDN    Virtual Visit No    Medication changes reported     No    Fall or balance concerns reported    No    Warm-up and Cool-down Performed on first and last piece of equipment    Resistance Training Performed Yes    VAD Patient? No    PAD/SET Patient? No      Pain Assessment   Currently in Pain? No/denies                Social History   Tobacco Use  Smoking Status Former   Current packs/day: 0.00   Types: Cigarettes   Quit date: 10/21/2019   Years since quitting: 4.2  Smokeless Tobacco Never    Goals Met:  Independence with exercise equipment Exercise tolerated well No report of concerns or symptoms today Strength training completed today  Goals Unmet:  Not Applicable  Comments: Pt able to follow exercise prescription today without complaint.  Will continue to monitor for progression.    Dr. Bethann Punches is Medical Director for [facility] Cardiac Rehabilitation.  Dr. Vida Rigger is Medical Director for [facility] Pulmonary Rehabilitation.

## 2024-01-06 ENCOUNTER — Ambulatory Visit: Payer: Managed Care, Other (non HMO)

## 2024-01-07 ENCOUNTER — Encounter: Payer: Managed Care, Other (non HMO) | Attending: Internal Medicine | Admitting: *Deleted

## 2024-01-07 DIAGNOSIS — I272 Pulmonary hypertension, unspecified: Secondary | ICD-10-CM | POA: Diagnosis present

## 2024-01-07 NOTE — Progress Notes (Signed)
 Pulmonary Individual Treatment Plan  Patient Details  Name: [name] MRN: [id number] Date of Birth: 01/09/1976 Referring Provider:   Flowsheet Row Pulmonary Rehab from 09/10/2023 in [facility] Cardiac and Pulmonary Rehab  Referring Provider Dr. Arvilla Meres       Initial Encounter Date:  Flowsheet Row Pulmonary Rehab from 09/10/2023 in [facility] Cardiac and Pulmonary Rehab  Date 09/10/23       Visit Diagnosis: Pulmonary hypertension (HCC)  Patient's Home Medications on Admission:  Current Outpatient Medications:    Budeson-Glycopyrrol-Formoterol (BREZTRI AEROSPHERE) 160-9-4.8 MCG/ACT AERO, Inhale 2 puffs into the lungs in the morning and at bedtime., Disp: 1 each, Rfl: 3   Budeson-Glycopyrrol-Formoterol (BREZTRI AEROSPHERE) 160-9-4.8 MCG/ACT AERO, Inhale 2 puffs into the lungs in the morning and at bedtime., Disp: 11.8 g, Rfl: 0   dapagliflozin propanediol (FARXIGA) 10 MG TABS tablet, Take 1 tablet (10 mg total) by mouth daily., Disp: 30 tablet, Rfl: 11   digoxin (LANOXIN) 0.125 MG tablet, TAKE 0.5 TABLETS (0.0625 MG TOTAL) BY MOUTH DAILY., Disp: 45 tablet, Rfl: 3   fluticasone (FLONASE) 50 MCG/ACT nasal spray, Place 1 spray into both nostrils daily., Disp: 100 mL, Rfl: 2   macitentan (OPSUMIT) 10 MG tablet, Take 1 tablet (10 mg total) by mouth daily., Disp: , Rfl:    rosuvastatin (CRESTOR) 10 MG tablet, TAKE 1 TABLET BY MOUTH EVERY DAY, Disp: 90 tablet, Rfl: 1   tadalafil, PAH, (ADCIRCA) 20 MG tablet, Take 2 tablets (40 mg total) by mouth daily., Disp: 60 tablet, Rfl: 11   tirzepatide (MOUNJARO) 10 MG/0.5ML Pen, Inject 10 mg into the skin once a week., Disp: 2 mL, Rfl: 0   VENTOLIN HFA 108 (90 Base) MCG/ACT inhaler, Inhale 1-2 puffs into the lungs every 4 (four) hours as needed for wheezing., Disp: , Rfl:   Past Medical History: Past Medical History:  Diagnosis Date   Abnormal thyroid function test    Anemia    Anxiety    Bronchitis 06/2023   finished prednisone and currently  taking Amoxicillin as of 06-12-23   Cervical lymphadenopathy    Chronic back pain    Ectopic pregnancy    Headache    MIGRAINES   History of kidney stones    HSV infection    Lumbar radiculopathy    Menorrhagia    Palpitations    Pneumonia    06/2023   PONV (postoperative nausea and vomiting)    DURING KIDNEY STONE REMOVAL   Seizures (HCC) 2014   one occurence-due to Tramadol   UTI (urinary tract infection)     Tobacco Use: Social History   Tobacco Use  Smoking Status Former   Current packs/day: 0.00   Types: Cigarettes   Quit date: 10/21/2019   Years since quitting: 4.2  Smokeless Tobacco Never    Labs: Review Flowsheet  More data exists      Latest Ref Rng & Units 04/20/2009 03/01/2021 07/18/2023 07/19/2023 07/22/2023  Labs for ITP Cardiac and Pulmonary Rehab  Cholestrol 0 - 200 mg/dL - - 409  - -  LDL (calc) 0 - 99 mg/dL - - 811  - -  HDL-C >91 mg/dL - - 41  - -  Trlycerides <150 mg/dL - - 79  - -  Hemoglobin A1c 4.8 - 5.6 % - 5.8  - 5.7  -  PH, Arterial 7.35 - 7.45 - - - - 7.459   PCO2 arterial 32 - 48 mmHg - - - - 32.1   Bicarbonate 20.0 - 28.0 mmol/L - -  21.2  - 28.6  28.5  22.7   TCO2 22 - 32 mmol/L 21  - - - 30  30  24    Acid-base deficit 0.0 - 2.0 mmol/L - - 3.1  - -  O2 Saturation % - - 75.1  - 59  57  95     Details       Multiple values from one day are sorted in reverse-chronological order          Pulmonary Assessment Scores:  Pulmonary Assessment Scores     Row Name 09/10/23 1507 09/12/23 1349 01/07/24 1055     ADL UCSD   ADL Phase -- Entry Exit   SOB Score total -- 5 9   Rest -- 0 0   Walk -- 0 0   Stairs -- 2 1   Bath -- 1 0   Dress -- 1 0   Shop -- 1 0     CAT Score   CAT Score -- 12 6     mMRC Score   mMRC Score 1 -- --            UCSD: Self-administered rating of dyspnea associated with activities of daily living (ADLs) 6-point scale (0 = "not at all" to 5 = "maximal or unable to do because of breathlessness")   Scoring Scores range from 0 to 120.  Minimally important difference is 5 units  CAT: CAT can identify the health impairment of COPD patients and is better correlated with disease progression.  CAT has a scoring range of zero to 40. The CAT score is classified into four groups of low (less than 10), medium (10 - 20), high (21-30) and very high (31-40) based on the impact level of disease on health status. A CAT score over 10 suggests significant symptoms.  A worsening CAT score could be explained by an exacerbation, poor medication adherence, poor inhaler technique, or progression of COPD or comorbid conditions.  CAT MCID is 2 points  mMRC: mMRC (Modified Medical Research Council) Dyspnea Scale is used to assess the degree of baseline functional disability in patients of respiratory disease due to dyspnea. No minimal important difference is established. A decrease in score of 1 point or greater is considered a positive change.   Pulmonary Function Assessment:   Exercise Target Goals: Exercise Program Goal: Individual exercise prescription set using results from initial 6 min walk test and THRR while considering  patient's activity barriers and safety.   Exercise Prescription Goal: Initial exercise prescription builds to 30-45 minutes a day of aerobic activity, 2-3 days per week.  Home exercise guidelines will be given to patient during program as part of exercise prescription that the participant will acknowledge.  Education: Aerobic Exercise: - Group verbal and visual presentation on the components of exercise prescription. Introduces F.I.T.T principle from ACSM for exercise prescriptions.  Reviews F.I.T.T. principles of aerobic exercise including progression. Written material given at graduation.   Education: Resistance Exercise: - Group verbal and visual presentation on the components of exercise prescription. Introduces F.I.T.T principle from ACSM for exercise prescriptions  Reviews  F.I.T.T. principles of resistance exercise including progression. Written material given at graduation.    Education: Exercise & Equipment Safety: - Individual verbal instruction and demonstration of equipment use and safety with use of the equipment. Flowsheet Row Pulmonary Rehab from 12/24/2023 in [facility] Cardiac and Pulmonary Rehab  Date 09/10/23  Educator [facility]  Instruction Review Code 1- [employer] Understanding       Education: Exercise  Physiology & General Exercise Guidelines: - Group verbal and written instruction with models to review the exercise physiology of the cardiovascular system and associated critical values. Provides general exercise guidelines with specific guidelines to those with heart or lung disease.    Education: Flexibility, Balance, Mind/Body Relaxation: - Group verbal and visual presentation with interactive activity on the components of exercise prescription. Introduces F.I.T.T principle from ACSM for exercise prescriptions. Reviews F.I.T.T. principles of flexibility and balance exercise training including progression. Also discusses the mind body connection.  Reviews various relaxation techniques to help reduce and manage stress (i.e. Deep breathing, progressive muscle relaxation, and visualization). Balance handout provided to take home. Written material given at graduation.   Activity Barriers & Risk Stratification:  Activity Barriers & Cardiac Risk Stratification - 09/10/23 1456       Activity Barriers & Cardiac Risk Stratification   Activity Barriers None             6 Minute Walk:  6 Minute Walk     Row Name 09/10/23 1453 12/31/23 1121       6 Minute Walk   Phase Initial Discharge    Distance 1520 feet 2020 feet    Distance % Change -- 32.9 %    Distance Feet Change -- 500 ft    Walk Time 6 minutes 6 minutes    # of Rest Breaks -- 0    MPH 2.9 3.83    METS 4.6 5.54    RPE 10 12    Perceived Dyspnea  1 1    VO2 Peak 16 19.38    Symptoms  No No    Resting HR 86 bpm 86 bpm    Resting BP 124/70 92/62    Resting Oxygen Saturation  95 % 98 %    Exercise Oxygen Saturation  during 6 min walk 89 % 88 %    Max Ex. HR 123 bpm 116 bpm    Max Ex. BP 134/70 128/60    2 Minute Post BP 118/70 108/56      Interval HR   1 Minute HR 112 116    2 Minute HR 118 92    3 Minute HR 118 97    4 Minute HR 122 106    5 Minute HR 123 94    6 Minute HR 103 96    2 Minute Post HR 89 103    Interval Heart Rate? Yes Yes      Interval Oxygen   Interval Oxygen? Yes Yes    Baseline Oxygen Saturation % 95 % 98 %    1 Minute Oxygen Saturation % 89 % 94 %    1 Minute Liters of Oxygen 0 L 0 L  RA    2 Minute Oxygen Saturation % 90 % 92 %    2 Minute Liters of Oxygen 0 L 0 L    3 Minute Oxygen Saturation % 90 % 90 %    3 Minute Liters of Oxygen 0 L 0 L    4 Minute Oxygen Saturation % 89 % 89 %    4 Minute Liters of Oxygen 0 L 0 L    5 Minute Oxygen Saturation % 89 % 89 %    5 Minute Liters of Oxygen 0 L 0 L    6 Minute Oxygen Saturation % 93 % 88 %    6 Minute Liters of Oxygen 0 L 0 L    2 Minute Post Oxygen Saturation % 94 %  98 %    2 Minute Post Liters of Oxygen 0 L 0 L            Oxygen Initial Assessment:  Oxygen Initial Assessment - 09/10/23 1501       Home Oxygen   Home Oxygen Device None    Sleep Oxygen Prescription None    Home Exercise Oxygen Prescription None    Home Resting Oxygen Prescription None    Compliance with Home Oxygen Use Yes      Initial 6 min Walk   Oxygen Used None      Program Oxygen Prescription   Program Oxygen Prescription None      Intervention   Short Term Goals To learn and demonstrate proper pursed lip breathing techniques or other breathing techniques.     Long  Term Goals Exhibits proper breathing techniques, such as pursed lip breathing or other method taught during program session             Oxygen Re-Evaluation:  Oxygen Re-Evaluation     Row Name 09/12/23 1109 10/22/23 0958  10/31/23 1101 12/24/23 0943       Program Oxygen Prescription   Program Oxygen Prescription -- None None None      Home Oxygen   Home Oxygen Device -- None None None    Sleep Oxygen Prescription -- None None None    Home Exercise Oxygen Prescription -- None None None    Home Resting Oxygen Prescription -- None None None    Compliance with Home Oxygen Use -- Yes Yes Yes      Goals/Expected Outcomes   Short Term Goals -- To learn and demonstrate proper pursed lip breathing techniques or other breathing techniques.  To learn and demonstrate proper pursed lip breathing techniques or other breathing techniques.  To learn and demonstrate proper pursed lip breathing techniques or other breathing techniques.     Long  Term Goals -- Exhibits proper breathing techniques, such as pursed lip breathing or other method taught during program session Exhibits proper breathing techniques, such as pursed lip breathing or other method taught during program session Exhibits proper breathing techniques, such as pursed lip breathing or other method taught during program session    Comments Reviewed PLB technique with pt.  Talked about how it works and it's importance in maintaining their exercise saturations. We reviewed PLB with [name] today. She voiced understanding and reports that she will practice her PLB technique more during her ADL's. She also states that she does not currently own a pulse oximeter. We encouraged her to obtain one and she stated that she will look into it. We also talked to her about keeping her oxygen saturations above 88%. Reviewed PLB with [name] today. She voiced understanding and says she would remember to use them during ADLs and exercise if needed. She still hasnt gotten a pulse ox for home but knows it would be helpful so today set goal to get one by 2025. We reviewed PLB technique with [name] today. She states that she is using her PLB with exercise and is not getting as SOB as she did when she  first started. She plans to purchase a pulse ox for home so that she can continue to monitor her O2 saturations during exercise once she graduates from the program.    Goals/Expected Outcomes Short: Become more profiecient at using PLB. Long: Become independent at using PLB. Short: Become more profiecient at using PLB. Long: Become independent at using PLB. STG: Get  a pulse ox for home by 2025. Become more profiecient at using PLB. Long: become independent at using PLB STG: Become more profiecient at using PLB. Long: become independent at using PLB.             Oxygen Discharge (Final Oxygen Re-Evaluation):  Oxygen Re-Evaluation - 12/24/23 0943       Program Oxygen Prescription   Program Oxygen Prescription None      Home Oxygen   Home Oxygen Device None    Sleep Oxygen Prescription None    Home Exercise Oxygen Prescription None    Home Resting Oxygen Prescription None    Compliance with Home Oxygen Use Yes      Goals/Expected Outcomes   Short Term Goals To learn and demonstrate proper pursed lip breathing techniques or other breathing techniques.     Long  Term Goals Exhibits proper breathing techniques, such as pursed lip breathing or other method taught during program session    Comments We reviewed PLB technique with [name] today. She states that she is using her PLB with exercise and is not getting as SOB as she did when she first started. She plans to purchase a pulse ox for home so that she can continue to monitor her O2 saturations during exercise once she graduates from the program.    Goals/Expected Outcomes STG: Become more profiecient at using PLB. Long: become independent at using PLB.             Initial Exercise Prescription:  Initial Exercise Prescription - 09/10/23 1500       NuStep   Level 3    SPM 80    Minutes 15    METs 4.6      Recumbant Elliptical   Level 3    RPM 50    Minutes 15    METs 4.6             Perform Capillary Blood Glucose  checks as needed.  Exercise Prescription Changes:   Exercise Prescription Changes     Row Name 09/10/23 1400 09/26/23 1600 10/07/23 1000 10/24/23 1600 10/29/23 1100     Response to Exercise   Blood Pressure (Admit) 124/70 122/62 110/70 120/62 --   Blood Pressure (Exercise) 134/70 138/70 134/70 128/68 --   Blood Pressure (Exit) 118/70 110/60 122/62 100/56 --   Heart Rate (Admit) 86 bpm 85 bpm 90 bpm 94 bpm --   Heart Rate (Exercise) 123 bpm 154 bpm 145 bpm 145 bpm --   Heart Rate (Exit) 89 bpm 109 bpm 112 bpm 117 bpm --   Oxygen Saturation (Admit) 95 % 92 % 90 % 90 % --   Oxygen Saturation (Exercise) 89 % 92 % 87 % 88 % --   Oxygen Saturation (Exit) 94 % 94 % 92 % 90 % --   Rating of Perceived Exertion (Exercise) 10 12 13 12  --   Perceived Dyspnea (Exercise) 1 2 3 2  --   Symptoms none none none none --   Comments results firts two weeks of exercise -- -- --   Duration Progress to 30 minutes of  aerobic without signs/symptoms of physical distress Progress to 30 minutes of  aerobic without signs/symptoms of physical distress Progress to 30 minutes of  aerobic without signs/symptoms of physical distress Progress to 30 minutes of  aerobic without signs/symptoms of physical distress --   Intensity THRR New THRR New THRR New THRR New --     Progression   Progression Continue to  progress workloads to maintain intensity without signs/symptoms of physical distress. Continue to progress workloads to maintain intensity without signs/symptoms of physical distress. Continue to progress workloads to maintain intensity without signs/symptoms of physical distress. Continue to progress workloads to maintain intensity without signs/symptoms of physical distress. --   Average METs 4.6 4.09 4.6 4.77 --     Resistance Training   Training Prescription -- Yes Yes Yes --   Weight -- 4 4 4  --   Reps -- 10-15 10-15 10-15 --     Interval Training   Interval Training -- No No No --     Treadmill   MPH  -- 3.2 3.2 3.2 --   Grade -- 3.5 3.5 3 --   Minutes -- 15 15 15  --   METs -- 4.99 4.99 4.77 --     NuStep   Level -- -- 3 4 --   Minutes -- -- 15 15 --     Recumbant Elliptical   Level -- 3 -- -- --   Minutes -- 15 -- -- --     Elliptical   Level -- 1 3.2 3.2 --   Speed -- 3.3 3.5 3.3 --   Minutes -- 15 15 15  --     Home Exercise Plan   Plans to continue exercise at -- -- -- -- Home (comment)  online cardio and strength classes and try [facility]   Frequency -- -- -- -- Add 3 additional days to program exercise sessions.   Initial Home Exercises Provided -- -- -- -- 10/29/23     Oxygen   Maintain Oxygen Saturation -- 88% or higher 88% or higher 88% or higher --    Row Name 11/07/23 1200 11/20/23 1600 12/05/23 0700 12/16/23 1700       Response to Exercise   Blood Pressure (Admit) 102/60 102/58 104/60 92/56    Blood Pressure (Exit) 102/60 108/60 110/64 96/56    Heart Rate (Admit) 90 bpm 88 bpm 92 bpm 91 bpm    Heart Rate (Exercise) 143 bpm 145 bpm 144 bpm 141 bpm    Heart Rate (Exit) 101 bpm 95 bpm 108 bpm 113 bpm    Oxygen Saturation (Admit) 96 % 96 % 97 % 96 %    Oxygen Saturation (Exercise) 89 % 89 % 91 % 91 %    Oxygen Saturation (Exit) 91 % 94 % 94 % 94 %    Rating of Perceived Exertion (Exercise) 12 13 12 12     Perceived Dyspnea (Exercise) 1 1 1 1     Symptoms none none none none    Duration Progress to 30 minutes of  aerobic without signs/symptoms of physical distress Progress to 30 minutes of  aerobic without signs/symptoms of physical distress Continue with 30 min of aerobic exercise without signs/symptoms of physical distress. Continue with 30 min of aerobic exercise without signs/symptoms of physical distress.    Intensity THRR New THRR New THRR unchanged THRR unchanged      Progression   Progression Continue to progress workloads to maintain intensity without signs/symptoms of physical distress. Continue to progress workloads to maintain intensity without  signs/symptoms of physical distress. Continue to progress workloads to maintain intensity without signs/symptoms of physical distress. Continue to progress workloads to maintain intensity without signs/symptoms of physical distress.    Average METs 4.27 4.8 4.37 4.54      Resistance Training   Training Prescription Yes Yes Yes Yes    Weight 4 4 4  4  Reps 10-15 10-15 10-15 10-15      Interval Training   Interval Training No No No No      Treadmill   MPH 3.4 3.2 3.5 3.5    Grade 3 4.5 3.5 3.5    Minutes 15 15 15 15     METs 5.01 5.44 5.37 5.37      Elliptical   Level 3 4 3 4     Speed 3 2.7 3 2.5    Minutes 15 15 15 15     METs 3.4 -- 3.4 3      Home Exercise Plan   Plans to continue exercise at Home (comment)  online cardio and strength classes and try [employer] Home (comment)  online cardio and strength classes and try [employer] Home (comment)  online cardio and strength classes and try [employer] Home (comment)  online cardio and strength classes and try [employer]    Frequency Add 3 additional days to program exercise sessions. Add 3 additional days to program exercise sessions. Add 3 additional days to program exercise sessions. Add 3 additional days to program exercise sessions.    Initial Home Exercises Provided 10/29/23 10/29/23 10/29/23 10/29/23      Oxygen   Maintain Oxygen Saturation 88% or higher 88% or higher 88% or higher 88% or higher             Exercise Comments:   Exercise Comments     Row Name 09/12/23 1108 01/07/24 1108         Exercise Comments First full day of exercise!  Patient was oriented to gym and equipment including functions, settings, policies, and procedures.  Patient's individual exercise prescription and treatment plan were reviewed.  All starting workloads were established based on the results of the 6 minute walk test done at initial orientation visit.  The plan for exercise progression was also introduced and progression will be customized  based on patient's performance and goals. [name] graduated today from  rehab with 32 sessions completed.  Details of the patient's exercise prescription and what She needs to do in order to continue the prescription and progress were discussed with patient.  Patient was given a copy of prescription and goals.  Patient verbalized understanding. [name] plans to continue to exercise by exercising at home.               Exercise Goals and Review:   Exercise Goals     Row Name 09/10/23 1500             Exercise Goals   Increase Physical Activity Yes       Intervention Develop an individualized exercise prescription for aerobic and resistive training based on initial evaluation findings, risk stratification, comorbidities and participant's personal goals.;Provide advice, education, support and counseling about physical activity/exercise needs.       Expected Outcomes Long Term: Exercising regularly at least 3-5 days a week.;Long Term: Add in home exercise to make exercise part of routine and to increase amount of physical activity.;Short Term: Attend rehab on a regular basis to increase amount of physical activity.       Increase Strength and Stamina Yes       Intervention Provide advice, education, support and counseling about physical activity/exercise needs.;Develop an individualized exercise prescription for aerobic and resistive training based on initial evaluation findings, risk stratification, comorbidities and participant's personal goals.       Expected Outcomes Long Term: Improve cardiorespiratory fitness, muscular endurance and strength as measured by increased METs and  functional capacity ( );Short Term: Perform resistance training exercises routinely during rehab and add in resistance training at home;Short Term: Increase workloads from initial exercise prescription for resistance, speed, and METs.       Able to understand and use rate of perceived exertion (RPE) scale Yes        Intervention Provide education and explanation on how to use RPE scale       Expected Outcomes Long Term:  Able to use RPE to guide intensity level when exercising independently;Short Term: Able to use RPE daily in rehab to express subjective intensity level       Able to understand and use Dyspnea scale Yes       Intervention Provide education and explanation on how to use Dyspnea scale       Expected Outcomes Long Term: Able to use Dyspnea scale to guide intensity level when exercising independently;Short Term: Able to use Dyspnea scale daily in rehab to express subjective sense of shortness of breath during exertion       Knowledge and understanding of Target Heart Rate Range (THRR) Yes       Intervention Provide education and explanation of THRR including how the numbers were predicted and where they are located for reference       Expected Outcomes Long Term: Able to use THRR to govern intensity when exercising independently;Short Term: Able to use daily as guideline for intensity in rehab;Short Term: Able to state/look up THRR       Able to check pulse independently Yes       Intervention Review the importance of being able to check your own pulse for safety during independent exercise;Provide education and demonstration on how to check pulse in carotid and radial arteries.       Expected Outcomes Long Term: Able to check pulse independently and accurately;Short Term: Able to explain why pulse checking is important during independent exercise       Understanding of Exercise Prescription Yes       Intervention Provide education, explanation, and written materials on patient's individual exercise prescription       Expected Outcomes Long Term: Able to explain home exercise prescription to exercise independently;Short Term: Able to explain program exercise prescription                Exercise Goals Re-Evaluation :  Exercise Goals Re-Evaluation     Row Name 09/12/23 1108 09/26/23 1632  10/07/23 1054 10/24/23 1602 10/29/23 1134     Exercise Goal Re-Evaluation   Exercise Goals Review Able to understand and use rate of perceived exertion (RPE) scale;Able to understand and use Dyspnea scale;Knowledge and understanding of Target Heart Rate Range (THRR);Understanding of Exercise Prescription Increase Physical Activity;Increase Strength and Stamina;Understanding of Exercise Prescription Increase Physical Activity;Increase Strength and Stamina;Understanding of Exercise Prescription Increase Physical Activity;Increase Strength and Stamina;Understanding of Exercise Prescription Able to understand and use Dyspnea scale;Increase Physical Activity;Understanding of Exercise Prescription;Increase Strength and Stamina;Knowledge and understanding of Target Heart Rate Range (THRR);Able to check pulse independently;Able to understand and use rate of perceived exertion (RPE) scale   Comments Reviewed RPE and dyspnea scale, THR and program prescription with pt today.  Pt voiced understanding and was given a copy of goals to take home. [name] is off to a good start in the program. She has only attended 3 sessions since her initial orientation to the program. In those 3 sessions she was able to increase her treadmill workload up to a speed of 3.2 mph and  an incline of 3.5%. We will continue to monitor her progress in the program. [name] continues to do well in the program. She has been able to maintain her workload on the treadmill at a speed of 3. and a grade of 3.5%. She has also been able to increase her level on the elliptical from level 1 to level 3. We will continue to monitor her progress in the program. [name] is doing well in rehab. She has been able to increase her level on the T4 nustep from level 3 to 4. She has also maintained her workloads on the elliptical, and treadmill. We will continue to encourage and record her progress in the program. Reviewed home exercise with pt today from 11:20am to 11:30am.  Pt  plans add 3 additional days and do online cardio and strength classes, such as mat pilates and cardio dance, and try the wellzone for exercise.  Reviewed THR, pulse, RPE, sign and symptoms, pulse oximetery and when to call 911 or MD.  Also discussed weather considerations and indoor options.  Pt voiced understanding.   Expected Outcomes Short: Use RPE daily to regulate intensity. Long: Follow program prescription in THR. Short: Continue to follow current exercise prescription. Long: Continue exercise to improve strength and stamina. Short: Continue to follow current exercise prescription. Long: Continue exercise to improve strength and stamina. Short: Increase treadmill and elliptical workloads when able. Long: Continue exercise to improve strength and stamina. Short: Add 3 additional days of cardio and strength classes for home exercise. Long: Continue to exercise independently.    Row Name 10/31/23 1105 11/07/23 1258 11/20/23 1610 12/05/23 0757 12/16/23 1707     Exercise Goal Re-Evaluation   Exercise Goals Review Able to understand and use Dyspnea scale;Increase Physical Activity;Understanding of Exercise Prescription;Increase Strength and Stamina Increase Physical Activity;Understanding of Exercise Prescription;Increase Strength and Stamina Increase Physical Activity;Understanding of Exercise Prescription;Increase Strength and Stamina Increase Physical Activity;Understanding of Exercise Prescription;Increase Strength and Stamina Increase Physical Activity;Understanding of Exercise Prescription;Increase Strength and Stamina   Comments She is working out well here at rehab but still not much at home. Did talk about ways to be more independent at home and coming to wellness zone after rehab if needed. She feels that would be helpful as she doesnt like working out in the cold. Encouraged her to look for a few youtube videos that she likes so she could use them on cold days or days she cant make it to rehab or  wellness zone. [name] continues to do well in rehab. She was able to maintain her workload on the treadmill, as well as her workload on the elliptical. We will continue to monitor and encourage her progress in the program. [name] is doing well in the program. She has continues to increase both her level on the treadmill and the elliptical. She recently increased her incline on the treadmill from 3% to 4.5%. She also increased her level on the elliptical from level 3 to 4. We will continue to monitor her progress in the program. [name] continues to do well in the program. She recently increased her speed on the treadmill to 3.5 mph while maintaining an incline of 3.5%. She also continues to work at level 3 on the elliptical and uses 4 lb hand weights for resistance training. We will continue to monitor her progress in the program. [name] continues to do well in rehab. She has maintained her workload on the treadmill to a speed of 3.5 mph and incline of 3.5%. She  increased her workload on the elliptical to level 4 at a speed of 2.5 mph. She is using 4 lb hand weights for resistance training. We will continue to monitor her progress in the program.   Expected Outcomes STG: Make sure to set herself up for success by visiitng the wellness zone and looking for youtube exercise videos to do at home. LTG: Exercise independently and improve breathing Short: Continue to progressively increase treadmill and elliptical workload. Long: Continue exercise to improve strength and stamina. Short: Continue to progressively increase treadmill and elliptical workload. Long: Continue exercise to improve strength and stamina. Short: Increase to 5 lb hand weights for resistance training. Long: Continue exercise to improve strength and stamina. Short: Progressively increase treadmill workload and increase to 5 lb hand weights for resistance training. Long: Continue exercise to improve strength and stamina.    Row Name 12/24/23 0930              Exercise Goal Re-Evaluation   Exercise Goals Review Increase Physical Activity;Understanding of Exercise Prescription;Increase Strength and Stamina       Comments [name] continues to do well with her exercise in the program. She also has been walking some on the weekends. She plans to attend the gym at her workplace once she graduates from the program and has already gotten acces to that facility. We will continue to monitor her progress in the program.       Expected Outcomes Short: Graduate. Long: Continue to exercise independently.                Discharge Exercise Prescription (Final Exercise Prescription Changes):  Exercise Prescription Changes - 12/16/23 1700       Response to Exercise   Blood Pressure (Admit) 92/56    Blood Pressure (Exit) 96/56    Heart Rate (Admit) 91 bpm    Heart Rate (Exercise) 141 bpm    Heart Rate (Exit) 113 bpm    Oxygen Saturation (Admit) 96 %    Oxygen Saturation (Exercise) 91 %    Oxygen Saturation (Exit) 94 %    Rating of Perceived Exertion (Exercise) 12    Perceived Dyspnea (Exercise) 1    Symptoms none    Duration Continue with 30 min of aerobic exercise without signs/symptoms of physical distress.    Intensity THRR unchanged      Progression   Progression Continue to progress workloads to maintain intensity without signs/symptoms of physical distress.    Average METs 4.54      Resistance Training   Training Prescription Yes    Weight 4    Reps 10-15      Interval Training   Interval Training No      Treadmill   MPH 3.5    Grade 3.5    Minutes 15    METs 5.37      Elliptical   Level 4    Speed 2.5    Minutes 15    METs 3      Home Exercise Plan   Plans to continue exercise at Home (comment)   online cardio and strength classes and try wellzone   Frequency Add 3 additional days to program exercise sessions.    Initial Home Exercises Provided 10/29/23      Oxygen   Maintain Oxygen Saturation 88% or higher              Nutrition:  Target Goals: Understanding of nutrition guidelines, daily intake of sodium 1500mg , cholesterol 200mg , calories 30% from  fat and 7% or less from saturated fats, daily to have 5 or more servings of fruits and vegetables.  Education: All About Nutrition: -Group instruction provided by verbal, written material, interactive activities, discussions, models, and posters to present general guidelines for heart healthy nutrition including fat, fiber, MyPlate, the role of sodium in heart healthy nutrition, utilization of the nutrition label, and utilization of this knowledge for meal planning. Follow up email sent as well. Written material given at graduation.   Biometrics:  Pre Biometrics - 09/10/23 1459       Pre Biometrics   Height 5' 3.9" (1.623 m)    Weight 166 lb 11.2 oz (75.6 kg)    Waist Circumference 37.5 inches    Hip Circumference 42.5 inches    Waist to Hip Ratio 0.88 %    BMI (Calculated) 28.71    Single Leg Stand 9.53 seconds             Post Biometrics - 12/31/23 1124        Post  Biometrics   Height 5' 3.9" (1.623 m)    Weight 141 lb 4.8 oz (64.1 kg)    Waist Circumference 38 inches    Hip Circumference 33.5 inches    Waist to Hip Ratio 1.13 %    BMI (Calculated) 24.33    Single Leg Stand 23.8 seconds             Nutrition Therapy Plan and Nutrition Goals:   Nutrition Assessments:  MEDIFICTS Score Key: >=70 Need to make dietary changes  40-70 Heart Healthy Diet <= 40 Therapeutic Level Cholesterol Diet  Flowsheet Row Pulmonary Rehab from 01/07/2024 in [facility] Cardiac and Pulmonary Rehab  Picture Your Plate Total Score on Admission 49  Picture Your Plate Total Score on Discharge 65      Picture Your Plate Scores: <44 Unhealthy dietary pattern with much room for improvement. 41-50 Dietary pattern unlikely to meet recommendations for good health and room for improvement. 51-60 More healthful dietary pattern, with some room for  improvement.  >60 Healthy dietary pattern, although there may be some specific behaviors that could be improved.   Nutrition Goals Re-Evaluation:  Nutrition Goals Re-Evaluation     Row Name 10/22/23 0951 10/31/23 1127 12/05/23 1137 12/24/23 0936       Goals   Nutrition Goal Latasha states that she feels she is doing well with her diet at this time. She states that she is a little overwhemed with her number of appointments, but would be open to meeting with the RD in the future. [name] reports she is working on increasing protein after being precribed a GLP-1 mounjaro. Discussed the importance or protein and demonstrated ways to help meet protein goals with smaller portions. She is drinking a protein shake, muscle milk 42g whey protein. Suggested she could spilt the shake in two and drink each half at different times if needed. She liked that idea and will try keeping snacks with protein shake smaller to avoid feeling overly full at times. She is reading labels and reducing sodium and saturated fat. Provided target goal of less than 1500mg . Reviewed a few important concepts like making nutrient dense balnaced plates with protein and complex carbs. Provided a handout on meal and snack ideas for nutreint dense low caloire choices She is still following dietary recommendations while on GLP-1 mounjaro. Increase in protein with smaller quantities. She has increased in Mounjaro dosage and has alot of stomach pain on her first night after dosage increase.  Encouraged her to communicate with her prescriber but that some side effects like stomach pain are common, especially after dosage increases. She reports she has experieces a few instances of stomach pain when increasing onto previous dosages. [name] continues to work on dietary patterns discussed with the RD. She is still trying to get more protein in at meals. She is also continuing to work on portion control. She reports that she is trying to drink more water, as well  as limiting her sodium intake.    Comment Short: Meet with RD in future. Long: Continue to make healthy dietary choices. STG: eat ~75-90g Pro daily and choose low caloire dense foods to support weight loss. LTG: follow heart healthy diet and maintain happy healthy weight STG: tolerate mounjaro, meet protein goals. LTG: follow heart healthy diet and maintain happy healthy weight Short: Drink more water. Long: Continue to work on dietary patterns discussed with RD.             Nutrition Goals Discharge (Final Nutrition Goals Re-Evaluation):  Nutrition Goals Re-Evaluation - 12/24/23 0936       Goals   Nutrition Goal Ekaterini continues to work on dietary patterns discussed with the RD. She is still trying to get more protein in at meals. She is also continuing to work on portion control. She reports that she is trying to drink more water, as well as limiting her sodium intake.    Comment Short: Drink more water. Long: Continue to work on dietary patterns discussed with RD.             Psychosocial: Target Goals: Acknowledge presence or absence of significant depression and/or stress, maximize coping skills, provide positive support system. Participant is able to verbalize types and ability to use techniques and skills needed for reducing stress and depression.   Education: Stress, Anxiety, and Depression - Group verbal and visual presentation to define topics covered.  Reviews how body is impacted by stress, anxiety, and depression.  Also discusses healthy ways to reduce stress and to treat/manage anxiety and depression.  Written material given at graduation.   Education: Sleep Hygiene -Provides group verbal and written instruction about how sleep can affect your health.  Define sleep hygiene, discuss sleep cycles and impact of sleep habits. Review good sleep hygiene tips.    Initial Review & Psychosocial Screening:  Initial Psych Review & Screening - 09/05/23 1012       Family Dynamics    Good Support System? Yes   Mom and Dad,     Barriers   Psychosocial barriers to participate in program There are no identifiable barriers or psychosocial needs.      Screening Interventions   Interventions To provide support and resources with identified psychosocial needs;Provide feedback about the scores to participant;Encouraged to exercise    Expected Outcomes Short Term goal: Utilizing psychosocial counselor, staff and physician to assist with identification of specific Stressors or current issues interfering with healing process. Setting desired goal for each stressor or current issue identified.;Long Term Goal: Stressors or current issues are controlled or eliminated.;Short Term goal: Identification and review with participant of any Quality of Life or Depression concerns found by scoring the questionnaire.;Long Term goal: The participant improves quality of Life and PHQ9 Scores as seen by post scores and/or verbalization of changes             Quality of Life Scores:  Scores of 19 and below usually indicate a poorer quality of life in these areas.  A difference of  2-3 points is a clinically meaningful difference.  A difference of 2-3 points in the total score of the Quality of Life Index has been associated with significant improvement in overall quality of life, self-image, physical symptoms, and general health in studies assessing change in quality of life.  PHQ-9: Review Flowsheet       09/10/2023  Depression screen PHQ 2/9  Decreased Interest 0  Down, Depressed, Hopeless 0  PHQ - 2 Score 0  Altered sleeping 1  Tired, decreased energy 1  Change in appetite 0  Feeling bad or failure about yourself  0  Trouble concentrating 0  Moving slowly or fidgety/restless 0  Suicidal thoughts 0  PHQ-9 Score 2  Difficult doing work/chores Not difficult at all   Interpretation of Total Score  Total Score Depression Severity:  1-4 = Minimal depression, 5-9 = Mild depression,  10-14 = Moderate depression, 15-19 = Moderately severe depression, 20-27 = Severe depression   Psychosocial Evaluation and Intervention:  Psychosocial Evaluation - 09/05/23 1026       Psychosocial Evaluation & Interventions   Interventions Encouraged to exercise with the program and follow exercise prescription    Comments Joliyah has no barriers to attending the program.  She did have anxiety about exercise after her HR went up to 155 when she got up to move in the hospital. TAlking with her physician ,she feels less anxiety being sent to the PR program and being in a clinical place to exercise. She has a new diagnosis of PUlmonary hypertension and is working with her doctors to do all they can to keep it controlled, and to understand how to manage with the disease.  She has support from her Mom and Dad. She lives with her 2 children.  She is ready to get started with the program.    Expected Outcomes STG attends all scheduled sessions, continues to work on reducing any anxiety about exercising with Pul HTN. LTG  Continues exercise progression after discharge, continue to work with her physician for any anxiety control    Continue Psychosocial Services  Follow up required by staff             Psychosocial Re-Evaluation:  Psychosocial Re-Evaluation     Row Name 10/22/23 0948 10/31/23 1110 12/05/23 1134 12/24/23 0933       Psychosocial Re-Evaluation   Current issues with -- None Identified None Identified None Identified    Comments [name] reports no major stressors at this time. She enjoys scrolling on tik tok for stress relief. She also likes to volunteer at her church when they need her help. She has a good support system made up by her parents, family, and church. She states that she has been sleeping better since rehab but is still waking up some throughout the night. She expressed their is no stressors or depression or anxeity. She reports the exercise she is getting from rehab has  significantly improved her sleep and mood as well. She has good support system with friends, family and church. She likes to watch Dena Billet and relax. Encouraged her to remember exercise as a good stress reliever She reports no anxiety, depression or stressors. She has enjoyed exercising fore stress relief and mood boost. She has good support system with family, friends and church. Yanni reports no major stressors at this time. She reports feeling a little down due to the passing of her grandmother but is doing well. She does enjoy scrolling tik tok to  get her mind off of the daily stressors of life. She feels that exercise is a good stress reliever as well. She is sleeping well overall but is still waking up some throughout the night.    Expected Outcomes Short: Continue to attend pulmonary rehab for stress relief. Long: Continue to maintain positive outlook. STG: Continue to attend pulmonary rehab for stress relief. LTG: Continue to use support systems to maintain positive outlook on health outcomes and daily life STG: continue pulmonary rehab and exercise indeprendently for stress relief. LTG: continue to use support systemn and maintain positive outlook on health and daily life Short: Continue to exercise for stress relief. Long: Continue to maintain positive outlook.    Interventions Encouraged to attend Pulmonary Rehabilitation for the exercise Encouraged to attend Pulmonary Rehabilitation for the exercise Encouraged to attend Pulmonary Rehabilitation for the exercise Encouraged to attend Pulmonary Rehabilitation for the exercise    Continue Psychosocial Services  Follow up required by staff Follow up required by staff Follow up required by staff Follow up required by staff             Psychosocial Discharge (Final Psychosocial Re-Evaluation):  Psychosocial Re-Evaluation - 12/24/23 0933       Psychosocial Re-Evaluation   Current issues with None Identified    Comments [name] reports no major  stressors at this time. She reports feeling a little down due to the passing of her grandmother but is doing well. She does enjoy scrolling tik tok to get her mind off of the daily stressors of life. She feels that exercise is a good stress reliever as well. She is sleeping well overall but is still waking up some throughout the night.    Expected Outcomes Short: Continue to exercise for stress relief. Long: Continue to maintain positive outlook.    Interventions Encouraged to attend Pulmonary Rehabilitation for the exercise    Continue Psychosocial Services  Follow up required by staff             Education: Education Goals: Education classes will be provided on a weekly basis, covering required topics. Participant will state understanding/return demonstration of topics presented.  Learning Barriers/Preferences:  Learning Barriers/Preferences - 09/05/23 1013       Learning Barriers/Preferences   Learning Barriers None    Learning Preferences None             General Pulmonary Education Topics:  Infection Prevention: - Provides verbal and written material to individual with discussion of infection control including proper hand washing and proper equipment cleaning during exercise session. Flowsheet Row Pulmonary Rehab from 12/24/2023 in [facility] Cardiac and Pulmonary Rehab  Date 09/10/23  Educator [facility]  Instruction Review Code 1- Verbalizes Understanding       Falls Prevention: - Provides verbal and written material to individual with discussion of falls prevention and safety. Flowsheet Row Pulmonary Rehab from 12/24/2023 in [facility] Cardiac and Pulmonary Rehab  Date 09/10/23  Educator [facility]  Instruction Review Code 1- Verbalizes Understanding       Chronic Lung Disease Review: - Group verbal instruction with posters, models, PowerPoint presentations and videos,  to review new updates, new respiratory medications, new advancements in procedures and treatments. Providing  information on websites and "800" numbers for continued self-education. Includes information about supplement oxygen, available portable oxygen systems, continuous and intermittent flow rates, oxygen safety, concentrators, and Medicare reimbursement for oxygen. Explanation of Pulmonary Drugs, including class, frequency, complications, importance of spacers, rinsing mouth after steroid MDI's, and proper cleaning methods  for nebulizers. Review of basic lung anatomy and physiology related to function, structure, and complications of lung disease. Review of risk factors. Discussion about methods for diagnosing sleep apnea and types of masks and machines for OSA. Includes a review of the use of types of environmental controls: home humidity, furnaces, filters, dust mite/pet prevention, HEPA vacuums. Discussion about weather changes, air quality and the benefits of nasal washing. Instruction on Warning signs, infection symptoms, calling MD promptly, preventive modes, and value of vaccinations. Review of effective airway clearance, coughing and/or vibration techniques. Emphasizing that all should Create an Action Plan. Written material given at graduation. Flowsheet Row Pulmonary Rehab from 12/24/2023 in [facility] Cardiac and Pulmonary Rehab  Date 12/24/23  Educator [facility]  Instruction Review Code 1- Verbalizes Understanding       AED/CPR: - Group verbal and written instruction with the use of models to demonstrate the basic use of the AED with the basic ABC's of resuscitation.    Anatomy and Cardiac Procedures: - Group verbal and visual presentation and models provide information about basic cardiac anatomy and function. Reviews the testing methods done to diagnose heart disease and the outcomes of the test results. Describes the treatment choices: Medical Management, Angioplasty, or Coronary Bypass Surgery for treating various heart conditions including Myocardial Infarction, Angina, Valve Disease, and Cardiac  Arrhythmias.  Written material given at graduation.   Medication Safety: - Group verbal and visual instruction to review commonly prescribed medications for heart and lung disease. Reviews the medication, class of the drug, and side effects. Includes the steps to properly store meds and maintain the prescription regimen.  Written material given at graduation.   Other: -Provides group and verbal instruction on various topics (see comments)   Knowledge Questionnaire Score:    Core Components/Risk Factors/Patient Goals at Admission:  Personal Goals and Risk Factors at Admission - 09/05/23 1016       Core Components/Risk Factors/Patient Goals on Admission    Weight Management Yes    Intervention Weight Management: Develop a combined nutrition and exercise program designed to reach desired caloric intake, while maintaining appropriate intake of nutrient and fiber, sodium and fats, and appropriate energy expenditure required for the weight goal.;Weight Management: Provide education and appropriate resources to help participant work on and attain dietary goals.    Admit Weight 164 lb (74.4 kg)   has lost 7 lbs   Goal Weight: Short Term 163 lb (73.9 kg)    Goal Weight: Long Term 135 lb (61.2 kg)    Expected Outcomes Short Term: Continue to assess and modify interventions until short term weight is achieved;Long Term: Adherence to nutrition and physical activity/exercise program aimed toward attainment of established weight goal;Weight Loss: Understanding of general recommendations for a balanced deficit meal plan, which promotes 1-2 lb weight loss per week and includes a negative energy balance of [phone] kcal/d    Improve shortness of breath with ADL's Yes    Intervention Provide education, individualized exercise plan and daily activity instruction to help decrease symptoms of SOB with activities of daily living.    Expected Outcomes Short Term: Improve cardiorespiratory fitness to achieve a  reduction of symptoms when performing ADLs;Long Term: Be able to perform more ADLs without symptoms or delay the onset of symptoms    Heart Failure Yes    Intervention Provide a combined exercise and nutrition program that is supplemented with education, support and counseling about heart failure. Directed toward relieving symptoms such as shortness of breath, decreased exercise tolerance, and  extremity edema.    Expected Outcomes Improve functional capacity of life;Short term: Attendance in program 2-3 days a week with increased exercise capacity. Reported lower sodium intake. Reported increased fruit and vegetable intake. Reports medication compliance.;Short term: Daily weights obtained and reported for increase. Utilizing diuretic protocols set by physician.;Long term: Adoption of self-care skills and reduction of barriers for early signs and symptoms recognition and intervention leading to self-care maintenance.    Lipids Yes    Intervention Provide education and support for participant on nutrition & aerobic/resistive exercise along with prescribed medications to achieve LDL 70mg , HDL >40mg .    Expected Outcomes Short Term: Participant states understanding of desired cholesterol values and is compliant with medications prescribed. Participant is following exercise prescription and nutrition guidelines.;Long Term: Cholesterol controlled with medications as prescribed, with individualized exercise RX and with personalized nutrition plan. Value goals: LDL < 70mg , HDL > 40 mg.             Education:Diabetes - Individual verbal and written instruction to review signs/symptoms of diabetes, desired ranges of glucose level fasting, after meals and with exercise. Acknowledge that pre and post exercise glucose checks will be done for 3 sessions at entry of program.   Know Your Numbers and Heart Failure: - Group verbal and visual instruction to discuss disease risk factors for cardiac and pulmonary  disease and treatment options.  Reviews associated critical values for Overweight/Obesity, Hypertension, Cholesterol, and Diabetes.  Discusses basics of heart failure: signs/symptoms and treatments.  Introduces Heart Failure Zone chart for action plan for heart failure.  Written material given at graduation. Flowsheet Row Pulmonary Rehab from 12/24/2023 in [facility] Cardiac [facility] Pulmonary Rehab  Date 12/19/23  Educator [facility]  Instruction Review Code 1- Verbalizes Understanding       Core Components/Risk Factors/Patient Goals Review:   Goals and Risk Factor Review     Row Name 10/22/23 1610 10/31/23 1120 12/05/23 1141 12/24/23 0938       Core Components/Risk Factors/Patient Goals Review   Personal Goals Review Weight Management/Obesity;Improve shortness of breath with ADL's;Hypertension Weight Management/Obesity;Improve shortness of breath with ADL's Weight Management/Obesity;Improve shortness of breath with ADL's Weight Management/Obesity;Improve shortness of breath with ADL's    Review [name] states that she is doing well with maintaining her weight around 160 lbs. She would like to lose a little weight with a weight goal of 135 lb. She states that she does own a BP cuff at home and is checking it occasionally. She reports that her BP has stayed within normal ranges. [name] states that her SOB has not improved too much lately. However, we revied PLB techniques and encouraged her to practice PLB during both exercise and during ADL's. She is happy with her weight loss progress so far. She is on mounjaro and has been tolerating it well, with a dose increase coming up in the week or so. She reports she was ~170lbs in september is down to 157.3lbs today. Commended her on her progress and reminder her than all hnages today must be sustainable if she is to reach her happy body weight and maintain it. She understands and is working on making these chnages more of a lifestyle for her. She is motivated to continue to  progress since her SOB has been much imporved when doing ADLs. She is happy with mounjaro and the weight loss progress she has made so far. Commended and encouraged her to continue following healthy eating hbaits while meeting nutrtional guidelines with mounjaro. She reports her breathing  is much better since starting the program during exericse and ADLs. She wants to set goal to get a pulse ox this month [name] is doing well with her weight as she weighed in today at 142 lbs. She is now working on maintaining her current weight through diet and exercise. She reports that she is no longer getting SOB with ADL's which she accredits to losing fliud and improved stamina through exercise. She still is planning on purchasing a pulse oximeter to monitor her oxygen saturations.    Expected Outcomes Short: Continue to work towards weight goal through diet and exercise. Long: Continue to monitor lifestyle risk factors. SHG: continue to work on weight loss and increased exercise workload. LTG: reach happy body weight and maintain lifestyle changes for better health and breathing STG: get pulse ox in the next few weeks, tolerate mounjaro. LTG: reach happy healthy body weight and maintain lifestyle changes for better health and breathing Short: Obtain a pulse oximeter. Long: Continue to manage lifestyle risk factors.             Core Components/Risk Factors/Patient Goals at Discharge (Final Review):   Goals and Risk Factor Review - 12/24/23 0938       Core Components/Risk Factors/Patient Goals Review   Personal Goals Review Weight Management/Obesity;Improve shortness of breath with ADL's    Review [name] is doing well with her weight as she weighed in today at 142 lbs. She is now working on maintaining her current weight through diet and exercise. She reports that she is no longer getting SOB with ADL's which she accredits to losing fliud and improved stamina through exercise. She still is planning on purchasing a  pulse oximeter to monitor her oxygen saturations.    Expected Outcomes Short: Obtain a pulse oximeter. Long: Continue to manage lifestyle risk factors.             ITP Comments:  ITP Comments     Row Name 09/05/23 1033 09/10/23 1453 09/12/23 1107 09/25/23 1007 10/23/23 1114   ITP Comments Virtual orientation call completed today. shehas an appointment on Date: 09/10/2023  for EP eval and gym Orientation.  Documentation of diagnosis [facility] be found in [facility] Date: 08/30/2023 . Completed and gym orientation. Initial ITP created and sent for review to Dr. Jinny Sanders, Medical Director. First full day of exercise!  Patient was oriented to gym and equipment including functions, settings, policies, and procedures.  Patient's individual exercise prescription and treatment plan were reviewed.  All starting workloads were established based on the results of the 6 minute walk test done at initial orientation visit.  The plan for exercise progression was also introduced and progression will be customized based on patient's performance and goals. 30 Day review completed. Medical Director ITP review done, changes made as directed, and signed approval by Medical Director.    new to program 30 Day review completed. Medical Director ITP review done, changes made as directed, and signed approval by Medical Director.    Row Name 11/20/23 1434 12/18/23 1143 01/07/24 1108       ITP Comments 30 Day review completed. Medical Director ITP review done, changes made as directed, and signed approval by Medical Director. 30 Day review completed. Medical Director ITP review done, changes made as directed, and signed approval by Medical Director. Farrie graduated today from  rehab with 32 sessions completed.  Details of the patient's exercise prescription and what She needs to do in order to continue the prescription and progress were  discussed with patient.  Patient was given a copy of prescription and goals.  Patient  verbalized understanding. Salah plans to continue to exercise by exercising at home.              Comments: DISCHARGE ITP

## 2024-01-07 NOTE — Progress Notes (Signed)
 Daily Session Note  Patient Details  Name: [name] MRN: [id number] Date of Birth: 11-Apr-1976 Referring Provider:   Flowsheet Row Pulmonary Rehab from 09/10/2023 in [facility] Cardiac and Pulmonary Rehab  Referring Provider Dr. Arvilla Meres       Encounter Date: 01/07/2024  Check In:  Session Check In - 01/07/24 1105       Check-In   Supervising physician immediately available to respond to emergencies See telemetry face sheet for immediately available ER MD    Location [facility]-Cardiac & Pulmonary Rehab    Staff Present Cora Collum, RN, BSN, CCRP;Noah Tickle, BS, Exercise Physiologist;Meredith Jewel Baize RN,BSN;Jason Wallace Cullens RDN,LDN    Virtual Visit No    Medication changes reported     No    Fall or balance concerns reported    No    Warm-up and Cool-down Performed on first and last piece of equipment    Resistance Training Performed Yes    VAD Patient? No    PAD/SET Patient? No      Pain Assessment   Currently in Pain? No/denies                Social History   Tobacco Use  Smoking Status Former   Current packs/day: 0.00   Types: Cigarettes   Quit date: 10/21/2019   Years since quitting: 4.2  Smokeless Tobacco Never    Goals Met:  Proper associated with RPD/PD & O2 Sat Independence with exercise equipment Exercise tolerated well Personal goals reviewed No report of concerns or symptoms today  Goals Unmet:  Not Applicable  Comments:  [name] graduated today from  rehab with 32 sessions completed.  Details of the patient's exercise prescription and what She needs to do in order to continue the prescription and progress were discussed with patient.  Patient was given a copy of prescription and goals.  Patient verbalized understanding. [name] plans to continue to exercise by exercising at home.   Dr. Bethann Punches is Medical Director for [facility] Cardiac Rehabilitation.  Dr. Vida Rigger is Medical Director for [facility] Pulmonary Rehabilitation.

## 2024-01-07 NOTE — Progress Notes (Signed)
 Discharge Note for  [name]     1976-08-18        Isabellarose graduated today from  rehab with 32 sessions completed.  Details of the patient's exercise prescription and what She needs to do in order to continue the prescription and progress were discussed with patient.  Patient was given a copy of prescription and goals.  Patient verbalized understanding. Pamla plans to continue to exercise by exercising at home.     6 Minute Walk     Row Name 09/10/23 1453 12/31/23 1121       6 Minute Walk   Phase Initial Discharge    Distance 1520 feet 2020 feet    Distance % Change -- 32.9 %    Distance Feet Change -- 500 ft    Walk Time 6 minutes 6 minutes    # of Rest Breaks -- 0    MPH 2.9 3.83    METS 4.6 5.54    RPE 10 12    Perceived Dyspnea  1 1    VO2 Peak 16 19.38    Symptoms No No    Resting HR 86 bpm 86 bpm    Resting BP 124/70 92/62    Resting Oxygen Saturation  95 % 98 %    Exercise Oxygen Saturation  during 6 min walk 89 % 88 %    Max Ex. HR 123 bpm 116 bpm    Max Ex. BP 134/70 128/60    2 Minute Post BP 118/70 108/56      Interval HR   1 Minute HR 112 116    2 Minute HR 118 92    3 Minute HR 118 97    4 Minute HR 122 106    5 Minute HR 123 94    6 Minute HR 103 96    2 Minute Post HR 89 103    Interval Heart Rate? Yes Yes      Interval Oxygen   Interval Oxygen? Yes Yes    Baseline Oxygen Saturation % 95 % 98 %    1 Minute Oxygen Saturation % 89 % 94 %    1 Minute Liters of Oxygen 0 L 0 L  RA    2 Minute Oxygen Saturation % 90 % 92 %    2 Minute Liters of Oxygen 0 L 0 L    3 Minute Oxygen Saturation % 90 % 90 %    3 Minute Liters of Oxygen 0 L 0 L    4 Minute Oxygen Saturation % 89 % 89 %    4 Minute Liters of Oxygen 0 L 0 L    5 Minute Oxygen Saturation % 89 % 89 %    5 Minute Liters of Oxygen 0 L 0 L    6 Minute Oxygen Saturation % 93 % 88 %    6 Minute Liters of Oxygen 0 L 0 L    2 Minute Post Oxygen Saturation % 94 % 98 %    2 Minute Post Liters of Oxygen 0 L  0 L

## 2024-01-27 ENCOUNTER — Telehealth: Payer: Self-pay | Admitting: Internal Medicine

## 2024-01-28 ENCOUNTER — Encounter: Payer: Self-pay | Admitting: Oncology

## 2024-01-28 ENCOUNTER — Other Ambulatory Visit (HOSPITAL_COMMUNITY): Payer: Self-pay

## 2024-01-28 MED ORDER — TIRZEPATIDE 12.5 MG/0.5ML ~~LOC~~ SOAJ
12.5000 mg | SUBCUTANEOUS | 0 refills | Status: DC
Start: 2024-01-28 — End: 2024-02-13

## 2024-01-28 NOTE — Telephone Encounter (Signed)
 Medication sent to pharmacy

## 2024-01-29 ENCOUNTER — Telehealth: Payer: Self-pay

## 2024-01-29 ENCOUNTER — Other Ambulatory Visit (HOSPITAL_COMMUNITY): Payer: Self-pay

## 2024-01-29 NOTE — Telephone Encounter (Addendum)
 Patient Advocate Encounter  Prior authorization for Reginal Lutes has been submitted and approved. Test billing returns $4 for 28 day supply. Secondary insurance limits 30 day fill.  Key: [id number] Effective: 01/29/2024 to 08/01/2024  Burnell Blanks, CPhT Rx Patient Advocate Phone: [phone]

## 2024-01-30 ENCOUNTER — Ambulatory Visit (INDEPENDENT_AMBULATORY_CARE_PROVIDER_SITE_OTHER): Payer: Managed Care, Other (non HMO) | Admitting: Pulmonary Disease

## 2024-01-30 ENCOUNTER — Encounter: Payer: Self-pay | Admitting: Pulmonary Disease

## 2024-01-30 VITALS — BP 94/58 | HR 92 | Resp 16 | Ht 63.9 in | Wt 132.2 lb

## 2024-01-30 DIAGNOSIS — J4550 Severe persistent asthma, uncomplicated: Secondary | ICD-10-CM | POA: Diagnosis not present

## 2024-01-30 NOTE — Progress Notes (Signed)
 Synopsis: Referred in by Carla Glassman, PA   Subjective:   PATIENT ID: [name] GENDER: female DOB: 12-29-1975, MRN: [id number]  Chief Complaint  Patient presents with   Follow-up    Breathing is good, no cough   HPI [name] is a pleasant 48 year old female patient with no significant past medical history presenting today to the pulmonary clinic to establish care regarding her pulmonary hypertension and obstructive lung disease seen on PFTs.  She underwent hysterectomy on 07/18/2023 and complicated by postop hypoxic respiratory failure and near syncope.  She subsequently underwent an echocardiogram that showed an EF of 55 to 60% with grade 2 diastolic dysfunction and moderately enlarged right ventricle with PASP of 83 mmHg and moderate to severe TR.  Her CTA chest did not show any pulmonary emboli.  Her BNP at the time was 470.  She was diuresed gently and started and discharged to follow-up with cardiology.  In the interim she underwent right heart cath (09/16)  RA 4 PA 71/27 (41) PCW 4 Fick 3.6/2.1 TD 3.2/1.8 PVR10.2 TPG 37 DPG 23 .   VQ scan 09/17 with slight heterogenous pulmonary perfusion but no wedge-shaped perfusion defects to suggest PE.   Sleep study with mild OSA AHI 9.6 unable to tolerated CPAP.   ANA negative, ANCA negative, anti-SCL 70 negative, C3 elevated at 193, C4 within normal range, rheumatoid factor negative, ESR 8 and CRP 1.0 borderline.  Anti-CCP 6.  HIV negative on 09/12.  CTA chest 09/12 did not show any signs of interstitial lung disease.  PFTs 10/17 FEV1 decreased at 57% of predicted 1.56 L, FVC 2.44 L 70% of predicted with FEV1 FVC ratio 64. No significant response to bronchodilators. DLCO mildly reduced.   She is currently on tadalafil 40 mg by mouth daily (transitioned from Sildenafil 08/14/2023 - Sildenafil 40mg  TID 09/16 - 10/09).  Macitentan 10 mg 1 tablet daily (09/30).  Digoxin 0.0625 mg 1 tab daily and dapagliflozin 1 tablet by mouth  daily.  Repeat BNP 171.7 09/23   6 min walk test 11/05 - 1520 feet with O2 sat nadir 89% on room air.  Doing much better today, prefers Clinical cytogeneticist over [employer]. Has not used her rescue inhaler. Using flonase once a day. Happy with cardiopulmonary rehab and did well on her 6 min walk test.   Repeat PFTs with improved obstructive defect, possible response to bronchodilators, air trapping and improved DLCO.   CT chest wo contrast without any acute intrathoracic findings.   Family history - Denies any family history of pulmonary disease   Social history - Ex smoker quit in 2020 smoked 1/2 PPD for 25 years, was vaping until July 2024.   ROS All systems were reviewed and are negative except for the above.  Objective:   Vitals:   01/30/24 1350  BP: (!) 94/58  Pulse: 92  Resp: 16  SpO2: 97%  Weight: 132 lb 3.2 oz (60 kg)  Height: 5' 3.9" (1.623 m)   97% on RA BMI Readings from Last 3 Encounters:  01/30/24 22.76 kg/m  12/31/23 24.33 kg/m  10/23/23 28.87 kg/m   Wt Readings from Last 3 Encounters:  01/30/24 132 lb 3.2 oz (60 kg)  12/31/23 141 lb 4.8 oz (64.1 kg)  10/23/23 163 lb (73.9 kg)    Physical Exam GEN: NAD, Healthy Appearing HEENT: Supple Neck, Reactive Pupils, EOMI  CVS: Normal S1, Normal S2, RRR, No murmurs or ES appreciated  Lungs: Clear bilateral air entry.  Abdomen: Soft, non tender,  non distended, + BS  Extremities: Warm and well perfused, No edema  Skin: No suspicious lesions appreciated  Psych: Normal Affect  Labs and imaging were reviewed.  Ancillary Information   CBC    Component Value Date/Time   WBC 9.6 07/23/2023 0539   RBC 4.46 07/23/2023 0539   HGB 13.6 07/23/2023 0539   HGB 12.9 07/30/2013 2034   HCT 40.7 07/23/2023 0539   HCT 38.3 07/30/2013 2034   PLT 326 07/23/2023 0539   PLT 443 (H) 07/30/2013 2034   MCV 91.3 07/23/2023 0539   MCV 91 07/30/2013 2034   MCH 30.5 07/23/2023 0539   MCHC 33.4 07/23/2023 0539   RDW 13.8 07/23/2023 0539    RDW 12.9 07/30/2013 2034   LYMPHSABS 3.4 07/23/2023 0539   MONOABS 0.9 07/23/2023 0539   EOSABS 0.5 07/23/2023 0539   BASOSABS 0.1 07/23/2023 0539       Latest Ref Rng & Units 10/22/2023    9:28 AM 08/22/2023   10:23 AM  PFT Results  FVC-Pre L 2.45  2.44   FVC-Predicted Pre % 71  70   FVC-Post L 2.63  2.57   FVC-Predicted Post % 76  74   Pre FEV1/FVC % % 73  64   Post FEV1/FCV % % 72  64   FEV1-Pre L 1.78  1.56   FEV1-Predicted Pre % 65  57   FEV1-Post L 1.89  1.66   DLCO uncorrected ml/min/mmHg 15.99  12.81   DLCO UNC% % 78  62   DLVA Predicted % 84  75   TLC L 5.03    TLC % Predicted % 102    RV % Predicted % 123       Assessment & Plan:  [name] is a pleasant 48 year old female patient with no significant past medical history presenting today to the pulmonary clinic to establish care regarding her pulmonary hypertension and obstructive lung disease seen on PFTs.  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 WHO gp II to III - Intermediate risk per ERS. #C/b RV Systolic dysfunction (Followed by Cardiology [name])   Autoimmune panel has been negative so far per above. Will add myomarker panel. HIV negative. Mild OSA with AHI 9.6. VQ scan negative. PFTs with obstructive lung disease and moderately reduced DLCO. CT chest without parenchymal lung disease.     Appears majorly to be Group 1 pulmonary arterial hypertension with some component of group 3 PA 71/27 (41) PCW 4 PVR 10.2 complicated by RV failure seen on Echocardiogram 09/13 with moderately reduced RV systolic function and moderately enlarged RV with CI 1.8 TD. bnp 09/13 470 coming down nicely 170 09/23. Above all patient is feeling better.   []  Currently on Tadalafil 40 mg PO daily and Macitentan 10mg  PO daily  []  Followed by cardiology and plan to repeat echocardiogram and possible RHC to decide and triple therapy. []  On Digoxin 0.0625mg   daily and [place] 10mg  daily.   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 Well controlled on Breztri.   EOS 500  FENO 26 PFTs with improved obstructive defect, air trapping and improved DLCO.   []   C/w Budesonide-Formoterol-Glycopyrrolate [Breztri] 160-4.5 2 puffs BID.  []   C/w Albuterol 2puffs Q6H as needed []  Advised on airway clearance regimen with Flutter valve  []  Flonase 1spray each nostril BID.  []   Allergen panel and total IgE with multiple allergies noted. Most notable cat dander and dust mites.   #OSA  Mild with AHI 9.6. Unable to tolerate CPAP. Advised to discuss oral appliance with Dr. Mayford Knife.  Return in about 6 months (around 08/01/2024).  I spent 45 minutes caring for this patient today, including preparing to see the patient, obtaining a medical history , reviewing a separately obtained history, performing a medically appropriate examination and/or evaluation, counseling and educating the patient/family/caregiver, ordering medications, tests, or procedures, referring and communicating with other health care professionals (not separately reported), documenting clinical information in the electronic health record, and independently interpreting results (not separately reported/billed) and communicating results to the patient/family/caregiver  Janann Colonel, MD [facility] 01/30/2024 2:18 PM

## 2024-02-03 ENCOUNTER — Other Ambulatory Visit (HOSPITAL_COMMUNITY): Payer: Self-pay

## 2024-02-10 ENCOUNTER — Telehealth: Payer: Self-pay | Admitting: Internal Medicine

## 2024-02-10 NOTE — Telephone Encounter (Incomplete)
 Called to confirm/remind patient of their appointment at the [facility] on 02/11/24***.   Appointment:   [] Confirmed  [x] Left mess   [] No answer/No voice mail  [] Phone not in service  Patient reminded to bring all medications and/or complete list.  Confirmed patient has transportation. Gave directions, instructed to utilize valet parking.

## 2024-02-11 ENCOUNTER — Encounter: Payer: Self-pay | Admitting: Oncology

## 2024-02-11 ENCOUNTER — Other Ambulatory Visit (HOSPITAL_COMMUNITY): Payer: Self-pay

## 2024-02-11 ENCOUNTER — Ambulatory Visit: Attending: Internal Medicine | Admitting: Internal Medicine

## 2024-02-11 VITALS — BP 101/73 | HR 87 | Wt 130.0 lb

## 2024-02-11 DIAGNOSIS — E785 Hyperlipidemia, unspecified: Secondary | ICD-10-CM | POA: Insufficient documentation

## 2024-02-11 DIAGNOSIS — G4733 Obstructive sleep apnea (adult) (pediatric): Secondary | ICD-10-CM | POA: Insufficient documentation

## 2024-02-11 DIAGNOSIS — I2781 Cor pulmonale (chronic): Secondary | ICD-10-CM | POA: Diagnosis not present

## 2024-02-11 DIAGNOSIS — I50810 Right heart failure, unspecified: Secondary | ICD-10-CM

## 2024-02-11 DIAGNOSIS — E663 Overweight: Secondary | ICD-10-CM

## 2024-02-11 DIAGNOSIS — Z79899 Other long term (current) drug therapy: Secondary | ICD-10-CM | POA: Insufficient documentation

## 2024-02-11 DIAGNOSIS — I50812 Chronic right heart failure: Secondary | ICD-10-CM | POA: Diagnosis present

## 2024-02-11 DIAGNOSIS — I272 Pulmonary hypertension, unspecified: Secondary | ICD-10-CM | POA: Insufficient documentation

## 2024-02-11 DIAGNOSIS — R942 Abnormal results of pulmonary function studies: Secondary | ICD-10-CM | POA: Insufficient documentation

## 2024-02-11 NOTE — Patient Instructions (Addendum)
 Medication Changes:  STOP DIGOXIN   Lab Work:  Go DOWN to LOWER LEVEL (LL) to have your blood work completed inside of [employer] office.  We will only call you if the results are abnormal or if the provider would like to make medication changes.   Testing/Procedures:  Please have your echo completed IN 3 MONTHS. You will check in for this at the [facility]. You have to arrive 15 MINS EARLY for preparation, otherwise you will have to reschedule.   Follow-Up in: 3 MONTHS WITH DR. Gala Romney.  Our Doctors' schedules are NOT open yet for 3 months. We will place you on our recall list. Once they are available, we will call you to schedule your follow up appointment.   At the [facility], you and your health needs are our priority. We have a designated team specialized in the treatment of Heart Failure. This Care Team includes your primary Heart Failure Specialized Cardiologist (physician), Advanced Practice Providers (APPs- Physician Assistants and Nurse Practitioners), and Pharmacist who all work together to provide you with the care you need, when you need it.   You may see any of the following providers on your designated Care Team at your next follow up:  Dr. Arvilla Meres Dr. Marca Ancona Dr. Dorthula Nettles Dr. Theresia Bough Clarisa Kindred, FNP Enos Fling, RPH-CPP  Please be sure to bring in all your medications bottles to every appointment.   Need to Contact us:  If you have any questions or concerns before your next appointment please send [place] a message through [place] or call our office at [phone].    TO LEAVE A MESSAGE FOR THE NURSE SELECT OPTION 2, PLEASE LEAVE A MESSAGE INCLUDING: YOUR NAME DATE OF BIRTH CALL BACK NUMBER REASON FOR CALL**this is important as we prioritize the call backs  YOU WILL RECEIVE A CALL BACK THE SAME DAY AS LONG AS YOU CALL BEFORE 4:00 PM

## 2024-02-11 NOTE — Progress Notes (Signed)
 ADVANCED HEART FAILURE CLINIC NOTE  Referring Physician: Larena Glassman, PA  Primary Care: Larena Glassman, PA HF: Dr. Gala Romney    HPI:  [name] is a 48 y.o. 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  She has had sinus tachycardia for many years.  She had an echocardiogram in April 2022 with normal LV/RV function and an RVSP of 36 mmHg.  Over the past year (2023-2024) she has had progressive dyspnea on exertion, lower extremity edema and bloating.  On 07/18/2023 she underwent elective hysterectomy which was complicated post procedurally by hypoxia and near syncope.  Postoperative CTA chest negative for PE.  Echocardiogram with EF of 55 to 60%, grade 2 diastolic dysfunction and moderately enlarged right ventricle with PASP of 83 mmHg with moderate to severe TR.  She was started on sildenafil and patient and presents today for follow-up.  Cath 9/24: Mild non-obstructive CAD (40%mLAD). RA 4 PA 71/27 (41) PCW 4 Fick 3.6/2.1 TD 3.2/2.8 PVR10.2 PaPi 11  Sildenafil started in 9/24. Started Opsumit 10/5.  Sleep study 10/24 AHI 9.6/hr. Now following with Dr. Mayford Knife. Pending CPAP  PFTs 10/24  FEV1  1.56 (57%) FVC 2.44 (70%) DLCO 62%  Now on Opsumit 10 daily and Tadalafi 40 daily   CT 12/13/23. Mild coronary calcifications. Otherwise normal  Echo 1/25 LVEF 55-60% RV mild HK RVSP 44 Personally reviewed  Here for f/u. Doing great. Saw Pulmonary - now on Breztri). Just finished Pulmonary Rehab 2,020 feet (687m) - (initial was 1,520 on 09/10/23). Breathing is good. No edema, orthopnea or PND. No syncope or presyncope.    Past Medical History:  Diagnosis Date   Abnormal thyroid function test    Anemia    Anxiety    Bronchitis 06/2023   finished prednisone and currently taking Amoxicillin as of 06-12-23   Cervical lymphadenopathy    Chronic back pain    Ectopic pregnancy    Headache    MIGRAINES   History of kidney stones    HSV  infection    Lumbar radiculopathy    Menorrhagia    Palpitations    Pneumonia    06/2023   PONV (postoperative nausea and vomiting)    DURING KIDNEY STONE REMOVAL   Seizures (HCC) 2014   one occurence-due to Tramadol   UTI (urinary tract infection)     Current Outpatient Medications  Medication Sig Dispense Refill   Budeson-Glycopyrrol-Formoterol (BREZTRI AEROSPHERE) 160-9-4.8 MCG/ACT AERO Inhale 2 puffs into the lungs in the morning and at bedtime. 1 each 3   Budeson-Glycopyrrol-Formoterol (BREZTRI AEROSPHERE) 160-9-4.8 MCG/ACT AERO Inhale 2 puffs into the lungs in the morning and at bedtime. 11.8 g 0   dapagliflozin propanediol (FARXIGA) 10 MG TABS tablet Take 1 tablet (10 mg total) by mouth daily. 30 tablet 11   digoxin (LANOXIN) 0.125 MG tablet TAKE 0.5 TABLETS (0.0625 MG TOTAL) BY MOUTH DAILY. 45 tablet 3   fluticasone (FLONASE) 50 MCG/ACT nasal spray Place 1 spray into both nostrils daily. 100 mL 2   macitentan (OPSUMIT) 10 MG tablet Take 1 tablet (10 mg total) by mouth daily.     rosuvastatin (CRESTOR) 10 MG tablet TAKE 1 TABLET BY MOUTH EVERY DAY 90 tablet 1   tadalafil, PAH, (ADCIRCA) 20 MG tablet Take 2 tablets (40 mg total) by mouth daily. 60 tablet 11   tirzepatide (MOUNJARO) 12.5 MG/0.5ML Pen Inject 12.5 mg into the skin once a week. 2 mL 0   VENTOLIN HFA 108 (90  Base) MCG/ACT inhaler Inhale 1-2 puffs into the lungs every 4 (four) hours as needed for wheezing.     No current facility-administered medications for this visit.    Allergies  Allergen Reactions   Sulfa Antibiotics Hives and Itching   Tramadol Other (See Comments)    seizures   Compazine [Prochlorperazine] Anxiety    Tachycardia       Social History   Socioeconomic History   Marital status: Divorced    Spouse name: Not on file   Number of children: Not on file   Years of education: Not on file   Highest education level: Not on file  Occupational History   Not on file  Tobacco Use   Smoking  status: Former    Current packs/day: 0.00    Types: Cigarettes    Quit date: 10/21/2019    Years since quitting: 4.3   Smokeless tobacco: Never  Vaping Use   Vaping status: Former   Quit date: 06/06/2023   Substances: Nicotine, Flavoring  Substance and Sexual Activity   Alcohol use: No   Drug use: No   Sexual activity: Yes  Other Topics Concern   Not on file  Social History Narrative   Not on file   Social Drivers of Health   Financial Resource Strain: Not on file  Food Insecurity: No Food Insecurity (07/21/2023)   Hunger Vital Sign    Worried About Running Out of Food in the Last Year: Never true    Ran Out of Food in the Last Year: Never true  Transportation Needs: No Transportation Needs (07/21/2023)   PRAPARE - Administrator, Civil Service (Medical): No    Lack of Transportation (Non-Medical): No  Physical Activity: Not on file  Stress: Not on file  Social Connections: Not on file  Intimate Partner Violence: Not At Risk (07/21/2023)   Humiliation, Afraid, Rape, and Kick questionnaire    Fear of Current or Ex-Partner: No    Emotionally Abused: No    Physically Abused: No    Sexually Abused: No      Family History  Problem Relation Age of Onset   Cancer Maternal Grandmother     PHYSICAL EXAM: Vitals:   02/11/24 0943  BP: 101/73  Pulse: 87  SpO2: 99%   General:  Well appearing. No resp difficulty HEENT: normal Neck: supple. no JVD. Carotids 2+ bilat; no bruits. No lymphadenopathy or thryomegaly appreciated. Cor: PMI nondisplaced. Regular rate & rhythm. No rubs, gallops or murmurs. Lungs: clear Abdomen: soft, nontender, nondistended. No hepatosplenomegaly. No bruits or masses. Good bowel sounds. Extremities: no cyanosis, clubbing, rash, edema Neuro: alert & orientedx3, cranial nerves grossly intact. moves all 4 extremities w/o difficulty. Affect pleasant  Wt Readings from Last 3 Encounters:  02/11/24 130 lb (59 kg)  01/30/24 132 lb 3.2 oz (60  kg)  12/31/23 141 lb 4.8 oz (64.1 kg)    DATA REVIEW  ECG: 07/29/23: sinus tachycardia  as per my personal interpretation  ECHO: 07/19/23: LVEF 55-60%, Grade II DD, RV moderately enlarged with mild to moderately reduced function as per my personal interpretation  CATH: 9/16 Ao = 102/71 (85) LV = 101/6 RA = 4  RV = 70/7 PA = 71/27 (41) PCW = 4 Fick cardiac output/index = 3.6/2.1 Thermo CO/CI = 3.2/1.8 PVR = 10.2 WU FA sat = 95% PA sat = 57%, 59% PAPi = 11    Assessment: 1. Mild non-obstructive CAD  2. LVEF 55-60% 3. Moderate PAH with moderate  to severely reduced CO  V/Q SCAN (07/23/23) Slight heterogeneous pulmonary perfusion but no wedge-shaped perfusion defects to suggest pulmonary embolus.    ASSESSMENT & PLAN:  Pulmonary Hypertension - Likely predominantly group 1 with some degree of group III  - ECHO  9/24 LVEF 55-60% G2DD. RV mild to moderately reduced function with septal flattening. Mod-sev TR RVSP 83 - CT negative for PE, V/Q scan with some mild heterogenity but no definitive sign of chronic PE - Cath 9/24: RA 4 PA 71/27 (41) PCW  4 Fick 3.6/2.1 Thermo CO/CI 3.2/1.8 PVR 10.2 WU PAPi 11  - CTD serologies: ESR 8, CRP 1, mildly elevated C3 complement at 193, negative anti-SCL, negative ANCA panel & negative ANA. Consider Rheum referral - V/Q scan negative.  - Mild OSA. Failed CPAP but has lost 40 pounds and probably doesn't need it.  - PFTs with DLCO with mild to moderate obstructive/restrictive lung disease -> followed by Pulmonary - CTA chest personally; does not appear to have much parenchymal lung disease. - Currently on tadalafil 40 daily and Opsumit 10. Tolerating well  - Echo 1/25 LVEF 55-60% RV mild HK RVSP 44 Personally reviewed - Much improved NYHA I. Volume ok  - BNP 171 in 9/23 (need to repeat) - 6 min walk test 09/10/23 - 1520 feet (428m) with O2 sat nadir 89% on room air.  - 6 MW 2,020 feet (636m) - 3/25 - Reveal Lite risk score today = 4 (low  risk) - may be 2 if BNP down - Doing very well. Low risk by Reveal but echo still with mildly elevated PA pressures.  - Will plan on repeating echo in 3 months. If pressures still elevated consider repeat RHC - labs today  2. Chronic RV Failure/cor pulmonale - Plan as above - can stop digoxin  3. Hyperlipidemia - crestor 10mg   4. Overweight - resolved - Body mass index is 22.38 kg/m. - Has lost 40 pounds  5. Abnormal PFTs - has obstructive/restrictive pattern on PFTs - has been followed by Pulmonary - Ct neagtive   6. OSA - AHI 9.6  - unable to tolerate CPAP - improved with weight loss   Arvilla Meres, MD  10:05 AM

## 2024-02-12 ENCOUNTER — Telehealth (HOSPITAL_COMMUNITY): Payer: Self-pay

## 2024-02-12 ENCOUNTER — Other Ambulatory Visit (HOSPITAL_COMMUNITY): Payer: Self-pay

## 2024-02-12 LAB — BASIC METABOLIC PANEL WITH GFR
BUN/Creatinine Ratio: 8 — ABNORMAL LOW (ref 9–23)
BUN: 6 mg/dL (ref 6–24)
CO2: 21 mmol/L (ref 20–29)
Calcium: 9.2 mg/dL (ref 8.7–10.2)
Chloride: 103 mmol/L (ref 96–106)
Creatinine, Ser: 0.73 mg/dL (ref 0.57–1.00)
Glucose: 66 mg/dL — ABNORMAL LOW (ref 70–99)
Potassium: 3.8 mmol/L (ref 3.5–5.2)
Sodium: 142 mmol/L (ref 134–144)
eGFR: 101 mL/min/{1.73_m2} (ref >59)

## 2024-02-12 LAB — BRAIN NATRIURETIC PEPTIDE: BNP: 12.7 pg/mL (ref 0.0–100.0)

## 2024-02-12 NOTE — Telephone Encounter (Signed)
 Advanced Heart Failure Patient Advocate Encounter  Prior authorization for Opsumit has been submitted and approved. Test billing returns $4 for 90 day supply.  Key: [id number] Approval dates not provided with ePA.  Burnell Blanks, CPhT Rx Patient Advocate Phone: [phone]

## 2024-02-13 ENCOUNTER — Telehealth: Payer: Self-pay | Admitting: Pharmacist

## 2024-02-13 MED ORDER — TIRZEPATIDE 15 MG/0.5ML ~~LOC~~ SOAJ: 15.0000 mg | SUBCUTANEOUS | 11 refills | Status: DC

## 2024-02-13 NOTE — Telephone Encounter (Signed)
 Discussed Macitentan cost with patient via telephone. Cost increased because medicaid required prior authorization. This has been completed and the medication is now $4. The patient was given the phone number for [facility] to retroactively change billing information. Additionally, patient is having no issues with Mounjaro and will increase dose to the final 15 mg dose.

## 2024-02-25 ENCOUNTER — Other Ambulatory Visit: Payer: Self-pay | Admitting: Cardiology

## 2024-02-25 ENCOUNTER — Other Ambulatory Visit (HOSPITAL_COMMUNITY): Payer: Self-pay

## 2024-02-25 ENCOUNTER — Encounter: Payer: Self-pay | Admitting: Oncology

## 2024-02-25 ENCOUNTER — Telehealth: Payer: Self-pay | Admitting: Pharmacist

## 2024-02-25 MED ORDER — MOUNJARO 12.5 MG/0.5ML ~~LOC~~ SOAJ: 12.5000 mg | SUBCUTANEOUS | 0 refills | Status: DC

## 2024-02-25 MED ORDER — SEMAGLUTIDE-WEIGHT MANAGEMENT 2.4 MG/0.75ML ~~LOC~~ SOAJ: 2.4000 mg | SUBCUTANEOUS | 11 refills | Status: AC

## 2024-02-25 NOTE — Telephone Encounter (Signed)
 Insurance is not covering Mounjaro anymore in any dosage. Wegovy  copay is $4. Will send in Wegovy  2.4 mg weekly in place of Mounjaro 15 mg weekly.

## 2024-02-25 NOTE — Addendum Note (Signed)
 Addended by: Margean Sheehan on: 02/25/2024 04:00 PM   Modules accepted: Orders

## 2024-02-25 NOTE — Telephone Encounter (Signed)
 Patient called to inform the clinic that Mounjaro 15 mg dose is not being covered on insurance. Will attempt another prior auth and in the meantime, patient will continue 12.5 mg weekly.

## 2024-05-11 ENCOUNTER — Ambulatory Visit
Admission: RE | Admit: 2024-05-11 | Discharge: 2024-05-11 | Disposition: A | Discharge status: Home / Self Care | Source: Ambulatory Visit | Attending: Internal Medicine | Admitting: Internal Medicine

## 2024-05-11 DIAGNOSIS — F419 Anxiety disorder, unspecified: Secondary | ICD-10-CM | POA: Diagnosis not present

## 2024-05-11 DIAGNOSIS — R002 Palpitations: Secondary | ICD-10-CM | POA: Diagnosis not present

## 2024-05-11 DIAGNOSIS — I272 Pulmonary hypertension, unspecified: Secondary | ICD-10-CM | POA: Diagnosis present

## 2024-05-11 DIAGNOSIS — R519 Headache, unspecified: Secondary | ICD-10-CM | POA: Diagnosis not present

## 2024-05-11 LAB — ECHOCARDIOGRAM COMPLETE
AR max vel: 1.92 cm2
AV Area VTI: 2.63 cm2
AV Area mean vel: 2.36 cm2
AV Mean grad: 3 mmHg
AV Peak grad: 6 mmHg
Ao pk vel: 1.22 m/s
Area-P 1/2: 3.93 cm2
MV VTI: 2.58 cm2
S' Lateral: 2.41 cm

## 2024-05-11 NOTE — Progress Notes (Signed)
*  PRELIMINARY RESULTS* Echocardiogram 2D Echocardiogram has been performed.  [name] 05/11/2024, 10:08 AM

## 2024-05-15 ENCOUNTER — Ambulatory Visit (HOSPITAL_COMMUNITY): Payer: Self-pay | Admitting: Internal Medicine

## 2024-06-02 ENCOUNTER — Telehealth (HOSPITAL_BASED_OUTPATIENT_CLINIC_OR_DEPARTMENT_OTHER): Payer: Self-pay

## 2024-06-02 ENCOUNTER — Telehealth: Payer: Self-pay | Admitting: Cardiology

## 2024-06-02 ENCOUNTER — Other Ambulatory Visit: Payer: Self-pay

## 2024-06-02 DIAGNOSIS — J42 Unspecified chronic bronchitis: Secondary | ICD-10-CM

## 2024-06-02 MED ORDER — BREZTRI AEROSPHERE 160-9-4.8 MCG/ACT IN AERO: 2.0000 | INHALATION_SPRAY | Freq: Two times a day (BID) | RESPIRATORY_TRACT | 11 refills | Status: DC

## 2024-06-02 NOTE — Telephone Encounter (Signed)
 Copied from CRM [id number]. Topic: Clinical - Prescription Issue >> May 28, 2024  1:46 PM Russell PARAS wrote: Reason for CRM:   Pt is contacting clinic to see if any Breztri  samples are avail at the clinic. She has only received samples and is now out of medication. If no samples avail, requested medication be called into [facility] on file. Requests call back  CB#  [phone]

## 2024-06-02 NOTE — Telephone Encounter (Signed)
 Called to confirm/remind patient of their appointment at the Advanced [facility] Failure Clinic on 06/03/24.   Appointment:   [x] Confirmed  [] Left mess   [] No answer/No voice mail  [] VM Full/unable to leave message  [] Phone not in service  Patient reminded to bring all medications and/or complete list.  Confirmed patient has transportation. Gave directions, instructed to utilize valet parking.

## 2024-06-02 NOTE — Telephone Encounter (Signed)
 Rx refilled to [facility] on file.

## 2024-06-03 ENCOUNTER — Encounter: Payer: Self-pay | Admitting: Cardiology

## 2024-06-03 ENCOUNTER — Ambulatory Visit: Attending: Cardiology | Admitting: Cardiology

## 2024-06-03 VITALS — BP 112/78 | HR 104 | Wt 125.8 lb

## 2024-06-03 DIAGNOSIS — R0789 Other chest pain: Secondary | ICD-10-CM | POA: Diagnosis present

## 2024-06-03 DIAGNOSIS — G4733 Obstructive sleep apnea (adult) (pediatric): Secondary | ICD-10-CM | POA: Diagnosis not present

## 2024-06-03 DIAGNOSIS — F17210 Nicotine dependence, cigarettes, uncomplicated: Secondary | ICD-10-CM | POA: Diagnosis not present

## 2024-06-03 DIAGNOSIS — Z79899 Other long term (current) drug therapy: Secondary | ICD-10-CM | POA: Insufficient documentation

## 2024-06-03 DIAGNOSIS — I272 Pulmonary hypertension, unspecified: Secondary | ICD-10-CM | POA: Insufficient documentation

## 2024-06-03 DIAGNOSIS — R079 Chest pain, unspecified: Secondary | ICD-10-CM | POA: Diagnosis not present

## 2024-06-03 DIAGNOSIS — J984 Other disorders of lung: Secondary | ICD-10-CM | POA: Diagnosis not present

## 2024-06-03 DIAGNOSIS — E785 Hyperlipidemia, unspecified: Secondary | ICD-10-CM | POA: Diagnosis not present

## 2024-06-03 DIAGNOSIS — E669 Obesity, unspecified: Secondary | ICD-10-CM | POA: Insufficient documentation

## 2024-06-03 NOTE — Patient Instructions (Signed)
 Medication Changes:  No medication changes today!    Follow-Up in: Please keep follow up appointment with Dr. Bensimhon.   Thank you for choosing [facility].    At the Advanced Heart Failure Clinic, you and your health needs are our priority. We have a designated team specialized in the treatment of Heart Failure. This Care Team includes your primary Heart Failure Specialized Cardiologist (physician), Advanced Practice Providers (APPs- Physician Assistants and Nurse Practitioners), and Pharmacist who all work together to provide you with the care you need, when you need it.   You may see any of the following providers on your designated Care Team at your next follow up:  Dr. Toribio Fuel Dr. Ezra Shuck Dr. Ria Commander Dr. Morene Brownie Ellouise Class, FNP Jaun Bash, RPH-CPP  Please be sure to bring in all your medications bottles to every appointment.   Need to Contact Us :  If you have any questions or concerns before your next appointment please send us  a message through [place] or call our office at [phone].    TO LEAVE A MESSAGE FOR THE NURSE SELECT OPTION 2, PLEASE LEAVE A MESSAGE INCLUDING: YOUR NAME DATE OF BIRTH CALL BACK NUMBER REASON FOR CALL**this is important as we prioritize the call backs  YOU WILL RECEIVE A CALL BACK THE SAME DAY AS LONG AS YOU CALL BEFORE 4:00 PM

## 2024-06-05 NOTE — Progress Notes (Signed)
 ADVANCED HEART FAILURE CLINIC NOTE  Referring Physician: Hampton, Lizet, PA  Primary Care: Hampton, Lizet, PA HF: Dr. Cherrie    HPI:  [name] is a 48 y.o. 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  She has had sinus tachycardia for many years.  She had an echocardiogram in April 2022 with normal LV/RV function and an RVSP of 36 mmHg.  Over the past year (2023-2024) she has had progressive dyspnea on exertion, lower extremity edema and bloating.  On 07/18/2023 she underwent elective hysterectomy which was complicated post procedurally by hypoxia and near syncope.  Postoperative CTA chest negative for PE.  Echocardiogram with EF of 55 to 60%, grade 2 diastolic dysfunction and moderately enlarged right ventricle with PASP of 83 mmHg with moderate to severe TR.  She was started on sildenafil  and patient and presents today for follow-up.  Cath 9/24: Mild non-obstructive CAD (40%mLAD). RA 4 PA 71/27 (41) PCW 4 Fick 3.6/2.1 TD 3.2/2.8 PVR10.2 PaPi 11  Sildenafil  started in 9/24. Started Opsumit  10/5.  Sleep study 10/24 AHI 9.6/hr. Now following with Dr. Shlomo. Pending CPAP  PFTs 10/24  FEV1  1.56 (57%) FVC 2.44 (70%) DLCO 62%  Now on Opsumit  10 daily and Tadalafi 40 daily   CT 12/13/23. Mild coronary calcifications. Otherwise normal  Echo 1/25 LVEF 55-60% RV mild HK RVSP 44 Personally reviewed  Here for acute visit due to chest discomfort. Pain is described as cramping and burning, epigastric and radiating to the back. Is relatively constant since it started, no change or worsening with exertion or stress. Not tender to palpation, no real change with position. EKG and heart cath reviewed in the room. She has been taking some old PPI and antacids for it. No change in her functional status, no increase shortness of breath, dizziness, palpitations. We discussed low likelihood of cardiac disease given that her symptoms are not  exertional, near normal prior heart cath. We talked about potential workup including stress test or repeat cath, but given non cardiac description will hold off and reassurance provided.    PHYSICAL EXAM: Vitals:   06/03/24 1442  BP: 112/78  Pulse: (!) 104  SpO2: 96%   GENERAL: NAD, well appearing PULM:  Normal work of breathing, CTAB CARDIAC:  JVP: flat         Normal rate with regular rhythm. No murmurs, rubs or gallops.  No edema. Warm and well perfused extremities. ABDOMEN: Soft, non-tender, non-distended. NEUROLOGIC: Patient is oriented x3 with no focal or lateralizing neurologic deficits.    Wt Readings from Last 3 Encounters:  06/03/24 125 lb 12.8 oz (57.1 kg)  02/11/24 130 lb (59 kg)  01/30/24 132 lb 3.2 oz (60 kg)    DATA REVIEW  ECG: 07/29/23: sinus tachycardia  as per my personal interpretation  ECHO: 07/19/23: LVEF 55-60%, Grade II DD, RV moderately enlarged with mild to moderately reduced function as per my personal interpretation 05/11/2024: LVEF 60-65%, normal diastolic, normal RV function and size, unable to estimate RVSP  CATH: 9/16 Ao = 102/71 (85) LV = 101/6 RA = 4  RV = 70/7 PA = 71/27 (41) PCW = 4 Fick cardiac output/index = 3.6/2.1 Thermo CO/CI = 3.2/1.8 PVR = 10.2 WU FA sat = 95% PA sat = 57%, 59% PAPi = 11    Assessment: 1. Mild non-obstructive CAD  2. LVEF 55-60% 3. Moderate PAH with moderate to severely reduced CO  V/Q SCAN (07/23/23) Slight heterogeneous pulmonary perfusion but  no wedge-shaped perfusion defects to suggest pulmonary embolus.    ASSESSMENT & PLAN:  Pulmonary Hypertension - Likely predominantly group 1 with some degree of group III  - ECHO  9/24 LVEF 55-60% G2DD. RV mild to moderately reduced function with septal flattening. Mod-sev TR RVSP 83 - CT negative for PE, V/Q scan with some mild heterogenity but no definitive sign of chronic PE - Cath 9/24: RA 4 PA 71/27 (41) PCW  4 Fick 3.6/2.1 Thermo CO/CI 3.2/1.8 PVR 10.2  WU PAPi 11  - CTD serologies: ESR 8, CRP 1, mildly elevated C3 complement at 193, negative anti-SCL, negative ANCA panel & negative ANA. Consider Rheum referral - V/Q scan negative.  - Mild OSA. Failed CPAP but has lost 40 pounds and probably doesn't need it.  - PFTs with DLCO with mild to moderate obstructive/restrictive lung disease -> followed by Pulmonary - CTA chest personally; does not appear to have much parenchymal lung disease. - WHO functional class I - Currently on tadalafil  40 daily and Opsumit  10. Tolerating well  - Echo 1/25 LVEF 55-60% RV mild HK RVSP 44 Personally reviewed - Echo 05/12/2023 with normal RV function, no TR, continued improvement, discussed today - BNP 12.7 on 02/2024 - 6 min walk test 09/10/23 - 1520 feet (495m) with O2 sat nadir 89% on room air.  - 6 MW 2,020 feet (672m) - 3/25 - Doing very well. Echo normalized, minimal symptoms, would continue current therapy  Chest discomfort: Epigastric, not related to exertion or stress, constant. EKG reviewed, no ischemic changes, symptoms are mild. Reassurance provided, will start a trial of PPI. If pain still present or worsening discussed workup including stress test or cardiac cath. Has follow up in 2 weeks with Dr. Bensimhon already, if still having pain can keep that appointment and discuss further workup at that time. - Warning symptoms and ER precautions discussed - Start PPI - Further workup if needed   Chronic RV Failure/cor pulmonale - Plan as above  Hyperlipidemia - crestor  10mg   Abnormal PFTs - has obstructive/restrictive pattern on PFTs - has been followed by Pulmonary - Ct neagtive   OSA - AHI 9.6  - unable to tolerate CPAP - improved with weight loss   Morene JINNY Brownie, MD  5:48 AM

## 2024-06-18 ENCOUNTER — Encounter: Admitting: Internal Medicine

## 2024-07-13 ENCOUNTER — Other Ambulatory Visit: Payer: Self-pay | Admitting: Internal Medicine

## 2024-07-17 ENCOUNTER — Other Ambulatory Visit (HOSPITAL_COMMUNITY): Payer: Self-pay

## 2024-07-17 ENCOUNTER — Encounter: Payer: Self-pay | Admitting: Oncology

## 2024-07-24 ENCOUNTER — Telehealth (HOSPITAL_COMMUNITY): Payer: Self-pay

## 2024-07-24 ENCOUNTER — Other Ambulatory Visit (HOSPITAL_COMMUNITY): Payer: Self-pay

## 2024-07-24 NOTE — Telephone Encounter (Signed)
 Advanced Heart Failure Patient Advocate Encounter  Prior authorization for Opsumit  has been submitted and approved. Test billing returns $0 for 30 day supply.  KeyBETHA LANDSBERG Effective: 07/24/2024 to 07/24/2025  Rachel DEL, CPhT Rx Patient Advocate Phone: [phone]

## 2024-07-27 ENCOUNTER — Telehealth: Payer: Self-pay | Admitting: Internal Medicine

## 2024-07-27 NOTE — Telephone Encounter (Signed)
 Called to confirm/remind patient of their appointment at the Advanced Heart Failure Clinic on 07/28/24.   Appointment:   [x] Confirmed  [] Left mess   [] No answer/No voice mail  [] VM Full/unable to leave message  [] Phone not in service  Patient reminded to bring all medications and/or complete list.  Confirmed patient has transportation. Gave directions, instructed to utilize valet parking.

## 2024-07-28 ENCOUNTER — Ambulatory Visit: Attending: Internal Medicine | Admitting: Internal Medicine

## 2024-07-28 VITALS — BP 102/73 | HR 87 | Wt 129.0 lb

## 2024-07-28 DIAGNOSIS — J984 Other disorders of lung: Secondary | ICD-10-CM | POA: Insufficient documentation

## 2024-07-28 DIAGNOSIS — R942 Abnormal results of pulmonary function studies: Secondary | ICD-10-CM | POA: Diagnosis not present

## 2024-07-28 DIAGNOSIS — R000 Tachycardia, unspecified: Secondary | ICD-10-CM | POA: Diagnosis not present

## 2024-07-28 DIAGNOSIS — I272 Pulmonary hypertension, unspecified: Secondary | ICD-10-CM

## 2024-07-28 DIAGNOSIS — E669 Obesity, unspecified: Secondary | ICD-10-CM | POA: Insufficient documentation

## 2024-07-28 DIAGNOSIS — I50810 Right heart failure, unspecified: Secondary | ICD-10-CM

## 2024-07-28 DIAGNOSIS — E785 Hyperlipidemia, unspecified: Secondary | ICD-10-CM | POA: Insufficient documentation

## 2024-07-28 DIAGNOSIS — Z79899 Other long term (current) drug therapy: Secondary | ICD-10-CM | POA: Diagnosis not present

## 2024-07-28 DIAGNOSIS — G4733 Obstructive sleep apnea (adult) (pediatric): Secondary | ICD-10-CM | POA: Insufficient documentation

## 2024-07-28 DIAGNOSIS — I2510 Atherosclerotic heart disease of native coronary artery without angina pectoris: Secondary | ICD-10-CM | POA: Diagnosis not present

## 2024-07-28 DIAGNOSIS — Z90710 Acquired absence of both cervix and uterus: Secondary | ICD-10-CM | POA: Insufficient documentation

## 2024-07-28 DIAGNOSIS — I270 Primary pulmonary hypertension: Secondary | ICD-10-CM | POA: Diagnosis present

## 2024-07-28 NOTE — Progress Notes (Signed)
 ADVANCED HEART FAILURE CLINIC NOTE  Referring Physician: Hampton, Lizet, PA  Primary Care: Hampton, Lizet, PA HF: Dr. Cherrie    HPI:  [name] is a 48 y.o. 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  She has had sinus tachycardia for many years.  She had an echocardiogram in April 2022 with normal LV/RV function and an RVSP of 36 mmHg.  Over the past year (2023-2024) she has had progressive dyspnea on exertion, lower extremity edema and bloating.  On 07/18/2023 she underwent elective hysterectomy which was complicated post procedurally by hypoxia and near syncope.  Postoperative CTA chest negative for PE.  Echocardiogram with EF of 55 to 60%, grade 2 diastolic dysfunction and moderately enlarged right ventricle with PASP of 83 mmHg with moderate to severe TR.  She was started on sildenafil  and patient and presents today for follow-up.  Cath 9/24: Mild non-obstructive CAD (40%mLAD). RA 4 PA 71/27 (41) PCW 4 Fick 3.6/2.1 TD 3.2/2.8 PVR10.2 PaPi 11  Sildenafil  started in 9/24. Started Opsumit  10/5.  Sleep study 10/24 AHI 9.6/hr. Now following with Dr. Shlomo. Pending CPAP  PFTs 10/24  FEV1  1.56 (57%) FVC 2.44 (70%) DLCO 62%  Now on Opsumit  10 daily and Tadalafi 40 daily   CT 12/13/23. Mild coronary calcifications. Otherwise normal  Echo 1/25 LVEF 55-60% RV mild HK RVSP 44 Personally reviewed  Seen by Dr. Zenaida on 06/03/24 for acute visit due to chest discomfort felt to be non-cardiac  - Echo 05/11/2024 EF 60-65% with normal RV function, no TR, continued improvement  Here for routine f/u. Feels good. Remains on combination therapy for PH. Complains of nocturnal night cramps. Walking    PHYSICAL EXAM: Vitals:   07/28/24 1544  BP: 102/73  Pulse: 87  SpO2: 99%   General:  Well appearing. No resp difficulty HEENT: normal Neck: supple. no JVD. Carotids 2+ bilat; no bruits. No lymphadenopathy or thryomegaly  appreciated. Cor: PMI nondisplaced. Regular rate & rhythm. No rubs, gallops or murmurs. Lungs: clear Abdomen: soft, nontender, nondistended. No hepatosplenomegaly. No bruits or masses. Good bowel sounds. Extremities: no cyanosis, clubbing, rash, edema Neuro: alert & orientedx3, cranial nerves grossly intact. moves all 4 extremities w/o difficulty. Affect pleasant    Wt Readings from Last 3 Encounters:  07/28/24 129 lb (58.5 kg)  06/03/24 125 lb 12.8 oz (57.1 kg)  02/11/24 130 lb (59 kg)    DATA REVIEW  ECG: 07/29/23: sinus tachycardia  as per my personal interpretation  ECHO: 07/19/23: LVEF 55-60%, Grade II DD, RV moderately enlarged with mild to moderately reduced function as per my personal interpretation 05/11/2024: LVEF 60-65%, normal diastolic, normal RV function and size, unable to estimate RVSP  CATH: 9/16 Ao = 102/71 (85) LV = 101/6 RA = 4  RV = 70/7 PA = 71/27 (41) PCW = 4 Fick cardiac output/index = 3.6/2.1 Thermo CO/CI = 3.2/1.8 PVR = 10.2 WU FA sat = 95% PA sat = 57%, 59% PAPi = 11    Assessment: 1. Mild non-obstructive CAD  2. LVEF 55-60% 3. Moderate PAH with moderate to severely reduced CO  V/Q SCAN (07/23/23) Slight heterogeneous pulmonary perfusion but no wedge-shaped perfusion defects to suggest pulmonary embolus.    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 RV mild to moderately reduced function with septal flattening. Mod-sev TR RVSP 83 - CT negative for PE,  V/Q scan with some mild heterogenity but no definitive sign of chronic PE - Cath 9/24: RA 4 PA 71/27 (41) PCW  4 Fick 3.6/2.1 Thermo CO/CI 3.2/1.8 PVR 10.2 WU PAPi 11  - CTD serologies: ESR 8, CRP 1, mildly elevated C3 complement at 193, negative anti-SCL, negative ANCA panel & negative ANA. - V/Q scan negative.  - Mild OSA. Failed CPAP but has lost 40 pounds and probably doesn't need  it.  - PFTs with DLCO with mild to moderate obstructive/restrictive lung disease -> followed by Pulmonary - CTA chest personally; does not appear to have much parenchymal lung disease. - WHO functional class I - Currently on tadalafil  40 daily and Opsumit  10. Tolerating well  - Echo 1/25 LVEF 55-60% RV mild HK RVSP 44 Personally reviewed - Echo 05/11/2024 EF 60-65% with normal RV function, no TR, continued improvement - BNP 12.7 on 02/2024 - 6 min walk test 09/10/23 - 1520 feet (448m) with O2 sat nadir 89% on room air.  - 6 MW (3/25) 2,020 feet (636m) - 3/25 - Doing very well. Reveal Lite Risk Score is 3 (low risk)  Echo normalized, minimal symptoms, would continue current therapy - Repeat echo in 1 year to follow    Chronic RV Failure/cor pulmonale -Resolved   Hyperlipidemia - crestor  10mg  - managed by PCP  Abnormal PFTs - has obstructive/restrictive pattern on PFTs - has been followed by Pulmonary - CT ok   OSA - AHI 9.6  - unable to tolerate CPAP - improved with weight loss   Toribio Fuel, MD  3:53 PM

## 2024-07-29 ENCOUNTER — Encounter: Admitting: Internal Medicine

## 2024-08-11 ENCOUNTER — Encounter: Payer: Self-pay | Admitting: Pulmonary Disease

## 2024-08-11 ENCOUNTER — Ambulatory Visit: Admitting: Pulmonary Disease

## 2024-08-11 VITALS — BP 110/70 | HR 82 | Temp 97.3°F | Ht 63.9 in | Wt 136.2 lb

## 2024-08-11 DIAGNOSIS — J4550 Severe persistent asthma, uncomplicated: Secondary | ICD-10-CM

## 2024-08-11 NOTE — Progress Notes (Signed)
 Synopsis: Referred in by Hampton, Lizet, PA   Subjective:   PATIENT ID: [name] GENDER: female DOB: 24-Jul-1976, MRN: [id number]  Chief Complaint  Patient presents with   Asthma    No SOB, wheezing or cough.  Using Breztri  2 puff BID and it is helping. Has Albuterol  as needed.   HPI [name] is a pleasant 48 year old female patient with no significant past medical history presenting today to the pulmonary clinic to establish care regarding her pulmonary hypertension and obstructive lung disease seen on PFTs.  She underwent hysterectomy on 07/18/2023 and complicated by postop hypoxic respiratory failure and near syncope.  She subsequently underwent an echocardiogram that showed an EF of 55 to 60% with grade 2 diastolic dysfunction and moderately enlarged right ventricle with PASP of 83 mmHg and moderate to severe TR.  Her CTA chest did not show any pulmonary emboli.  Her BNP at the time was 470.  She was diuresed gently and started and discharged to follow-up with cardiology.  In the interim she underwent right heart cath (09/16)  RA 4 PA 71/27 (41) PCW 4 Fick 3.6/2.1 TD 3.2/1.8 PVR10.2 TPG 37 DPG 23 .   VQ scan 09/17 with slight heterogenous pulmonary perfusion but no wedge-shaped perfusion defects to suggest PE.   Sleep study with mild OSA AHI 9.6 unable to tolerated CPAP.   ANA negative, ANCA negative, anti-SCL 70 negative, C3 elevated at 193, C4 within normal range, rheumatoid factor negative, ESR 8 and CRP 1.0 borderline.  Anti-CCP 6.  HIV negative on 09/12.  CTA chest 09/12 did not show any signs of interstitial lung disease.  PFTs 10/17 FEV1 decreased at 57% of predicted 1.56 L, FVC 2.44 L 70% of predicted with FEV1 FVC ratio 64. No significant response to bronchodilators. DLCO mildly reduced.   She is currently on tadalafil  40 mg by mouth daily (transitioned from Sildenafil  08/14/2023 - Sildenafil  40mg  TID 09/16 - 10/09).  Macitentan  10 mg 1 tablet daily (09/30).  Digoxin   0.0625 mg 1 tab daily and dapagliflozin  1 tablet by mouth daily.  Repeat BNP 171.7 09/23   6 min walk test 11/05 - 1520 feet with O2 sat nadir 89% on room air.  Doing much better today, prefers Breztri  over Trelegy. Has not used her rescue inhaler. Using flonase  once a day. Happy with cardiopulmonary rehab and did well on her 6 min walk test.   Repeat PFTs with improved obstructive defect, possible response to bronchodilators, air trapping and improved DLCO.   CT chest wo contrast without any acute intrathoracic findings.   Family history - Denies any family history of pulmonary disease   Social history - Ex smoker quit in 2020 smoked 1/2 PPD for 25 years, was vaping until July 2024.   OV 10.07.2025 - Doing well overall. On Opsumit  and Tadalafil  for PAH. Last BNP 12.7. Compliant with Breztri  BID and flonase  daily. No complaints.   ROS All systems were reviewed and are negative except for the above.  Objective:   Vitals:   08/11/24 1136  BP: 110/70  Pulse: 82  Temp: (!) 97.3 F (36.3 C)  SpO2: 96%  Weight: 136 lb 3.2 oz (61.8 kg)  Height: 5' 3.9 (1.623 m)   96% on RA BMI Readings from Last 3 Encounters:  08/11/24 23.45 kg/m  07/28/24 22.21 kg/m  06/03/24 21.66 kg/m   Wt Readings from Last 3 Encounters:  08/11/24 136 lb 3.2 oz (61.8 kg)  07/28/24 129 lb (58.5 kg)  06/03/24 125  lb 12.8 oz (57.1 kg)    Physical Exam GEN: NAD, Healthy Appearing HEENT: Supple Neck, Reactive Pupils, EOMI  CVS: Normal S1, Normal S2, RRR, No murmurs or ES appreciated  Lungs: Clear bilateral air entry.  Abdomen: Soft, non tender, non distended, + BS  Extremities: Warm and well perfused, No edema  Skin: No suspicious lesions appreciated  Psych: Normal Affect  Labs and imaging were reviewed.  Ancillary Information   CBC    Component Value Date/Time   WBC 9.6 07/23/2023 0539   RBC 4.46 07/23/2023 0539   HGB 13.6 07/23/2023 0539   HGB 12.9 07/30/2013 2034   HCT 40.7 07/23/2023  0539   HCT 38.3 07/30/2013 2034   PLT 326 07/23/2023 0539   PLT 443 (H) 07/30/2013 2034   MCV 91.3 07/23/2023 0539   MCV 91 07/30/2013 2034   MCH 30.5 07/23/2023 0539   MCHC 33.4 07/23/2023 0539   RDW 13.8 07/23/2023 0539   RDW 12.9 07/30/2013 2034   LYMPHSABS 3.4 07/23/2023 0539   MONOABS 0.9 07/23/2023 0539   EOSABS 0.5 07/23/2023 0539   BASOSABS 0.1 07/23/2023 0539       Latest Ref Rng & Units 10/22/2023    9:28 AM 08/22/2023   10:23 AM  PFT Results  FVC-Pre L 2.45  2.44   FVC-Predicted Pre % 71  70   FVC-Post L 2.63  2.57   FVC-Predicted Post % 76  74   Pre FEV1/FVC % % 73  64   Post FEV1/FCV % % 72  64   FEV1-Pre L 1.78  1.56   FEV1-Predicted Pre % 65  57   FEV1-Post L 1.89  1.66   DLCO uncorrected ml/min/mmHg 15.99  12.81   DLCO UNC% % 78  62   DLVA Predicted % 84  75   TLC L 5.03    TLC % Predicted % 102    RV % Predicted % 123       Assessment & Plan:  [name] is a pleasant 48 year old female patient with no significant past medical history presenting today to the pulmonary clinic to establish care regarding her pulmonary hypertension and obstructive lung disease seen on PFTs.  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 WHO gp II to III - Intermediate risk per ERS. #C/b RV Systolic dysfunction (Followed by Cardiology Dr. Luz)   Autoimmune panel has been negative. HIV negative. Mild OSA with AHI 9.6. VQ scan negative. PFTs with obstructive lung disease and moderately reduced DLCO. CT chest without parenchymal lung disease.     Appears majorly to be Group 1 pulmonary arterial hypertension with some component of group 3 PA 71/27 (41) PCW 4 PVR 10.2 complicated by RV failure seen on Echocardiogram 09/13 with moderately reduced RV systolic function and moderately enlarged RV with CI 1.8 TD. bnp 09/13 470 coming down nicely 170 09/23 and 12.7 02/2024. []  Currently on Tadalafil  40 mg PO daily  and Macitentan  10mg  PO daily  []  Followed by cardiology. []  On Digoxin  0.0625mg  daily and Fraxiga 10mg  daily.   #Obstructive lung disease picture consistent with moderate persistent asthma #Chronic rhinosinusitis  #Chest congestion  Overall picture consistent with moderate to severe persistent asthma. Well controlled on Breztri .   EOS 500  FENO 26 PFTs with improved obstructive defect, air trapping and improved DLCO.   []   C/w Budesonide-Formoterol-Glycopyrrolate  [Breztri ] 160-4.5 2 puffs BID.  []   C/w Albuterol  2puffs Q6H as  needed []  Advised on airway clearance regimen with Flutter valve  []  Flonase  1spray each nostril BID.  []  Allergen panel and total IgE with multiple allergies noted. Most notable cat dander and dust mites.   #OSA  Mild with AHI 9.6. Unable to tolerate CPAP.  Return in about 6 months (around 02/09/2025).  I spent 30 minutes caring for this patient today, including preparing to see the patient, obtaining a medical history , reviewing a separately obtained history, performing a medically appropriate examination and/or evaluation, counseling and educating the patient/family/caregiver, documenting clinical information in the electronic health record, and independently interpreting results (not separately reported/billed) and communicating results to the patient/family/caregiver  Darrin Barn, MD [facility] 08/11/2024 12:45 PM

## 2024-08-13 ENCOUNTER — Other Ambulatory Visit (HOSPITAL_COMMUNITY): Payer: Self-pay

## 2024-08-13 ENCOUNTER — Encounter: Payer: Self-pay | Admitting: Oncology

## 2024-08-13 ENCOUNTER — Telehealth: Payer: Self-pay

## 2024-08-13 DIAGNOSIS — J42 Unspecified chronic bronchitis: Secondary | ICD-10-CM

## 2024-08-13 MED ORDER — BREZTRI AEROSPHERE 160-9-4.8 MCG/ACT IN AERO: 2.0000 | INHALATION_SPRAY | Freq: Two times a day (BID) | RESPIRATORY_TRACT | 3 refills | Status: AC

## 2024-08-13 NOTE — Addendum Note (Signed)
 Addended by: VICCI EVALENE DEL on: 08/13/2024 04:09 PM   Modules accepted: Orders

## 2024-08-13 NOTE — Telephone Encounter (Signed)
 Copied from CRM [id number]. Topic: Clinical - Medication Prior Auth >> Aug 13, 2024 10:18 AM [name] wrote: Reason for CRM: Pt was attempting to get a refill for her budesonide-glycopyrrolate -formoterol (BREZTRI  AEROSPHERE) 160-9-4.8 MCG/ACT AERO inhaler, but the pharmacy [id number] - [place], [place] - [address] stated with her insurance she needed a prior authorization to get the medication filled. Pt stated she is out of the medication, and would like assistance with this as soon as possible.  Pt uses Medicaid as well as [employer] for her insurances.   Pt's phone number is [phone] ok to leave a vm.

## 2024-08-13 NOTE — Telephone Encounter (Signed)
 90 day supply has been sent in and I have notified the patient.  Nothing further needed.

## 2024-08-14 ENCOUNTER — Telehealth (HOSPITAL_COMMUNITY): Payer: Self-pay | Admitting: Pharmacist

## 2024-08-14 NOTE — Telephone Encounter (Signed)
 Insurance formulary has changed and patient is unable to get any GLP-1RA covered on her [employer] plan without a diagnosis of DM. Submitted prior authorization for Wegovy (Key: [id number]) to Medicaid, however it was denied. Discussed with patient who will attempt to change plans during open enrollment.

## 2024-09-15 ENCOUNTER — Other Ambulatory Visit: Payer: Self-pay

## 2024-09-15 DIAGNOSIS — I272 Pulmonary hypertension, unspecified: Secondary | ICD-10-CM

## 2024-09-15 MED ORDER — DAPAGLIFLOZIN PROPANEDIOL 10 MG PO TABS: 10.0000 mg | ORAL_TABLET | Freq: Every day | ORAL | 5 refills | Status: AC

## 2024-10-21 ENCOUNTER — Other Ambulatory Visit: Payer: Self-pay | Admitting: Pharmacist

## 2024-10-21 MED ORDER — TADALAFIL (PAH) 20 MG PO TABS: 40.0000 mg | ORAL_TABLET | Freq: Every day | ORAL | 11 refills | Status: AC

## 2024-11-02 ENCOUNTER — Other Ambulatory Visit: Payer: Self-pay

## 2024-11-02 ENCOUNTER — Observation Stay: Admitting: Anesthesiology

## 2024-11-02 ENCOUNTER — Encounter: Admission: EM | Disposition: A | Payer: Self-pay | Source: Home / Self Care | Attending: Emergency Medicine

## 2024-11-02 ENCOUNTER — Observation Stay
Admission: EM | Admit: 2024-11-02 | Discharge: 2024-11-03 | Disposition: A | Discharge status: Home / Self Care | Attending: General Surgery | Admitting: General Surgery

## 2024-11-02 ENCOUNTER — Emergency Department

## 2024-11-02 DIAGNOSIS — I272 Pulmonary hypertension, unspecified: Secondary | ICD-10-CM | POA: Diagnosis not present

## 2024-11-02 DIAGNOSIS — J45909 Unspecified asthma, uncomplicated: Secondary | ICD-10-CM | POA: Insufficient documentation

## 2024-11-02 DIAGNOSIS — K819 Cholecystitis, unspecified: Principal | ICD-10-CM

## 2024-11-02 DIAGNOSIS — Z87891 Personal history of nicotine dependence: Secondary | ICD-10-CM | POA: Diagnosis not present

## 2024-11-02 DIAGNOSIS — K8012 Calculus of gallbladder with acute and chronic cholecystitis without obstruction: Secondary | ICD-10-CM | POA: Diagnosis not present

## 2024-11-02 DIAGNOSIS — Z79899 Other long term (current) drug therapy: Secondary | ICD-10-CM | POA: Insufficient documentation

## 2024-11-02 DIAGNOSIS — K810 Acute cholecystitis: Secondary | ICD-10-CM | POA: Diagnosis not present

## 2024-11-02 DIAGNOSIS — R1013 Epigastric pain: Secondary | ICD-10-CM | POA: Diagnosis present

## 2024-11-02 LAB — CBC
HCT: 44.2 % (ref 36.0–46.0)
Hemoglobin: 14.4 g/dL (ref 12.0–15.0)
MCH: 30.1 pg (ref 26.0–34.0)
MCHC: 32.6 g/dL (ref 30.0–36.0)
MCV: 92.5 fL (ref 80.0–100.0)
Platelets: 185 K/uL (ref 150–400)
RBC: 4.78 MIL/uL (ref 3.87–5.11)
RDW: 12.9 % (ref 11.5–15.5)
WBC: 10 K/uL (ref 4.0–10.5)
nRBC: 0 % (ref 0.0–0.2)

## 2024-11-02 LAB — URINALYSIS, ROUTINE W REFLEX MICROSCOPIC
Bilirubin Urine: NEGATIVE
Glucose, UA: >=500 mg/dL — AB
Hgb urine dipstick: NEGATIVE
Ketones, ur: NEGATIVE mg/dL
Nitrite: NEGATIVE
Protein, ur: NEGATIVE mg/dL
Specific Gravity, Urine: 1.024 (ref 1.005–1.030)
pH: 7 (ref 5.0–8.0)

## 2024-11-02 LAB — COMPREHENSIVE METABOLIC PANEL WITH GFR
ALT: 14 U/L (ref 0–44)
AST: 16 U/L (ref 15–41)
Albumin: 4.3 g/dL (ref 3.5–5.0)
Alkaline Phosphatase: 68 U/L (ref 38–126)
Anion gap: 13 (ref 5–15)
BUN: 14 mg/dL (ref 6–20)
CO2: 22 mmol/L (ref 22–32)
Calcium: 9.3 mg/dL (ref 8.9–10.3)
Chloride: 104 mmol/L (ref 98–111)
Creatinine, Ser: 0.88 mg/dL (ref 0.44–1.00)
GFR, Estimated: >60 mL/min
Glucose, Bld: 121 mg/dL — ABNORMAL HIGH (ref 70–99)
Potassium: 4.1 mmol/L (ref 3.5–5.1)
Sodium: 139 mmol/L (ref 135–145)
Total Bilirubin: 0.5 mg/dL (ref 0.0–1.2)
Total Protein: 7.1 g/dL (ref 6.5–8.1)

## 2024-11-02 LAB — TROPONIN T, HIGH SENSITIVITY
Troponin T High Sensitivity: <15 ng/L (ref 0–19)
Troponin T High Sensitivity: <15 ng/L (ref 0–19)

## 2024-11-02 LAB — LIPASE, BLOOD: Lipase: 60 U/L — ABNORMAL HIGH (ref 11–51)

## 2024-11-02 SURGERY — CHOLECYSTECTOMY, ROBOT-ASSISTED, LAPAROSCOPIC
Anesthesia: General

## 2024-11-02 MED ORDER — CEFAZOLIN SODIUM-DEXTROSE 2-4 GM/100ML-% IV SOLN
INTRAVENOUS | Status: AC
  Filled 2024-11-02: qty 100

## 2024-11-02 MED ORDER — MORPHINE SULFATE (PF) 4 MG/ML IV SOLN
4.0000 mg | Freq: Once | INTRAVENOUS | Status: AC
  Administered 2024-11-02: 4 mg via INTRAVENOUS
  Filled 2024-11-02: qty 1

## 2024-11-02 MED ORDER — FENTANYL CITRATE (PF) 100 MCG/2ML IJ SOLN
25.0000 ug | INTRAMUSCULAR | Status: DC | PRN
  Administered 2024-11-02 (×3): 50 ug via INTRAVENOUS
  Administered 2024-11-02 (×2): 25 ug via INTRAVENOUS

## 2024-11-02 MED ORDER — BUDESON-GLYCOPYRROL-FORMOTEROL 160-9-4.8 MCG/ACT IN AERO
2.0000 | INHALATION_SPRAY | Freq: Two times a day (BID) | RESPIRATORY_TRACT | Status: DC
  Administered 2024-11-02 – 2024-11-03 (×2): 2 via RESPIRATORY_TRACT
  Filled 2024-11-02: qty 5.9

## 2024-11-02 MED ORDER — ALBUTEROL SULFATE (2.5 MG/3ML) 0.083% IN NEBU: 2.5000 mg | INHALATION_SOLUTION | RESPIRATORY_TRACT | Status: DC | PRN

## 2024-11-02 MED ORDER — BUPIVACAINE-EPINEPHRINE 0.25% -1:200000 IJ SOLN
INTRAMUSCULAR | Status: DC | PRN
  Administered 2024-11-02: 30 mL

## 2024-11-02 MED ORDER — BUPIVACAINE-EPINEPHRINE (PF) 0.25% -1:200000 IJ SOLN
INTRAMUSCULAR | Status: AC
  Filled 2024-11-02: qty 30

## 2024-11-02 MED ORDER — ACETAMINOPHEN 10 MG/ML IV SOLN: 1000.0000 mg | Freq: Once | INTRAVENOUS | Status: DC | PRN

## 2024-11-02 MED ORDER — SODIUM CHLORIDE 0.9 % IV BOLUS
500.0000 mL | Freq: Once | INTRAVENOUS | Status: AC
  Administered 2024-11-02: 500 mL via INTRAVENOUS

## 2024-11-02 MED ORDER — FENTANYL CITRATE (PF) 100 MCG/2ML IJ SOLN
INTRAMUSCULAR | Status: AC
  Filled 2024-11-02: qty 2

## 2024-11-02 MED ORDER — ONDANSETRON HCL 4 MG/2ML IJ SOLN
4.0000 mg | Freq: Once | INTRAMUSCULAR | Status: AC
  Administered 2024-11-02: 4 mg via INTRAVENOUS
  Filled 2024-11-02: qty 2

## 2024-11-02 MED ORDER — PROPOFOL 10 MG/ML IV BOLUS
INTRAVENOUS | Status: DC | PRN
  Administered 2024-11-02: 150 mg via INTRAVENOUS

## 2024-11-02 MED ORDER — CEFAZOLIN SODIUM-DEXTROSE 2-4 GM/100ML-% IV SOLN
2.0000 g | Freq: Once | INTRAVENOUS | Status: AC
  Administered 2024-11-02: 2 g via INTRAVENOUS

## 2024-11-02 MED ORDER — 0.9 % SODIUM CHLORIDE (POUR BTL) OPTIME
TOPICAL | Status: DC | PRN
  Administered 2024-11-02: 500 mL

## 2024-11-02 MED ORDER — ENOXAPARIN SODIUM 40 MG/0.4ML IJ SOSY: 40.0000 mg | PREFILLED_SYRINGE | INTRAMUSCULAR | Status: DC

## 2024-11-02 MED ORDER — MIDAZOLAM HCL (PF) 2 MG/2ML IJ SOLN
INTRAMUSCULAR | Status: DC | PRN
  Administered 2024-11-02: 2 mg via INTRAVENOUS

## 2024-11-02 MED ORDER — ACETAMINOPHEN 325 MG PO TABS: 650.0000 mg | ORAL_TABLET | Freq: Four times a day (QID) | ORAL | Status: DC | PRN

## 2024-11-02 MED ORDER — SUGAMMADEX SODIUM 200 MG/2ML IV SOLN
INTRAVENOUS | Status: DC | PRN
  Administered 2024-11-02 (×2): 100 mg via INTRAVENOUS

## 2024-11-02 MED ORDER — DEXAMETHASONE SOD PHOSPHATE PF 10 MG/ML IJ SOLN
INTRAMUSCULAR | Status: DC | PRN
  Administered 2024-11-02: 4 mg via INTRAVENOUS

## 2024-11-02 MED ORDER — HYDROCODONE-ACETAMINOPHEN 5-325 MG PO TABS
1.0000 | ORAL_TABLET | ORAL | Status: DC | PRN
  Administered 2024-11-03: 2 via ORAL
  Filled 2024-11-02: qty 2

## 2024-11-02 MED ORDER — LIDOCAINE HCL (CARDIAC) PF 100 MG/5ML IV SOSY
PREFILLED_SYRINGE | INTRAVENOUS | Status: DC | PRN
  Administered 2024-11-02: 100 mg via INTRAVENOUS

## 2024-11-02 MED ORDER — FENTANYL CITRATE (PF) 100 MCG/2ML IJ SOLN
INTRAMUSCULAR | Status: DC | PRN
  Administered 2024-11-02 (×2): 50 ug via INTRAVENOUS

## 2024-11-02 MED ORDER — INDOCYANINE GREEN 25 MG IJ SOLR
1.2500 mg | Freq: Once | INTRAMUSCULAR | Status: AC
  Administered 2024-11-02: 1.25 mg via INTRAVENOUS

## 2024-11-02 MED ORDER — DROPERIDOL 2.5 MG/ML IJ SOLN: 0.6250 mg | Freq: Once | INTRAMUSCULAR | Status: DC | PRN

## 2024-11-02 MED ORDER — ONDANSETRON 4 MG PO TBDP
4.0000 mg | ORAL_TABLET | Freq: Four times a day (QID) | ORAL | Status: DC | PRN
  Administered 2024-11-02: 4 mg via ORAL
  Filled 2024-11-02: qty 1

## 2024-11-02 MED ORDER — OXYCODONE HCL 5 MG PO TABS
5.0000 mg | ORAL_TABLET | Freq: Once | ORAL | Status: AC | PRN
  Administered 2024-11-02: 5 mg via ORAL

## 2024-11-02 MED ORDER — SEVOFLURANE IN SOLN
RESPIRATORY_TRACT | Status: AC
  Filled 2024-11-02: qty 250

## 2024-11-02 MED ORDER — ONDANSETRON HCL 4 MG/2ML IJ SOLN
4.0000 mg | Freq: Four times a day (QID) | INTRAMUSCULAR | Status: DC | PRN
  Administered 2024-11-02 – 2024-11-03 (×2): 4 mg via INTRAVENOUS
  Filled 2024-11-02: qty 2

## 2024-11-02 MED ORDER — DEXMEDETOMIDINE HCL IN NACL 80 MCG/20ML IV SOLN
INTRAVENOUS | Status: DC | PRN
  Administered 2024-11-02: 4 ug via INTRAVENOUS

## 2024-11-02 MED ORDER — FLUTICASONE PROPIONATE 50 MCG/ACT NA SUSP
1.0000 | Freq: Every day | NASAL | Status: DC
  Administered 2024-11-03: 1 via NASAL
  Filled 2024-11-02: qty 16

## 2024-11-02 MED ORDER — KETOROLAC TROMETHAMINE 30 MG/ML IJ SOLN
INTRAMUSCULAR | Status: DC | PRN
  Administered 2024-11-02: 30 mg via INTRAVENOUS

## 2024-11-02 MED ORDER — LACTATED RINGERS IV SOLN: INTRAVENOUS | Status: DC | PRN

## 2024-11-02 MED ORDER — ACETAMINOPHEN 650 MG RE SUPP: 650.0000 mg | Freq: Four times a day (QID) | RECTAL | Status: DC | PRN

## 2024-11-02 MED ORDER — OXYCODONE HCL 5 MG/5ML PO SOLN: 5.0000 mg | Freq: Once | ORAL | Status: AC | PRN

## 2024-11-02 MED ORDER — DAPAGLIFLOZIN PROPANEDIOL 10 MG PO TABS
10.0000 mg | ORAL_TABLET | Freq: Every day | ORAL | Status: DC
  Administered 2024-11-03: 10 mg via ORAL
  Filled 2024-11-02 (×2): qty 1

## 2024-11-02 MED ORDER — ROCURONIUM BROMIDE 100 MG/10ML IV SOLN
INTRAVENOUS | Status: DC | PRN
  Administered 2024-11-02: 78 mg via INTRAVENOUS

## 2024-11-02 MED ORDER — OXYCODONE HCL 5 MG PO TABS
ORAL_TABLET | ORAL | Status: AC
  Filled 2024-11-02: qty 1

## 2024-11-02 MED ORDER — MORPHINE SULFATE (PF) 4 MG/ML IV SOLN
4.0000 mg | INTRAVENOUS | Status: DC | PRN
  Administered 2024-11-02 – 2024-11-03 (×3): 4 mg via INTRAVENOUS
  Filled 2024-11-02 (×3): qty 1

## 2024-11-02 MED ORDER — SODIUM CHLORIDE 0.9 % IV SOLN: INTRAVENOUS | Status: DC

## 2024-11-02 MED ORDER — PHENYLEPHRINE 80 MCG/ML (10ML) SYRINGE FOR IV PUSH (FOR BLOOD PRESSURE SUPPORT)
PREFILLED_SYRINGE | INTRAVENOUS | Status: DC | PRN
  Administered 2024-11-02: 160 ug via INTRAVENOUS

## 2024-11-02 MED ORDER — TADALAFIL (PAH) 20 MG PO TABS
40.0000 mg | ORAL_TABLET | Freq: Every day | ORAL | Status: DC
  Administered 2024-11-03: 40 mg via ORAL
  Filled 2024-11-02 (×2): qty 2

## 2024-11-02 SURGICAL SUPPLY — 38 items
BAG PRESSURE INF REUSE 1000 (BAG) IMPLANT
CANNULA REDUCER 12-8 DVNC XI (CANNULA) ×1 IMPLANT
CAUTERY HOOK MNPLR 1.6 DVNC XI (INSTRUMENTS) ×1 IMPLANT
CLIP LIGATING HEM O LOK PURPLE (MISCELLANEOUS) IMPLANT
CLIP LIGATING HEMO O LOK GREEN (MISCELLANEOUS) ×1 IMPLANT
DEFOGGER SCOPE WARM SEASHARP (MISCELLANEOUS) ×1 IMPLANT
DERMABOND ADVANCED .7 DNX12 (GAUZE/BANDAGES/DRESSINGS) ×1 IMPLANT
DRAPE ARM DVNC X/XI (DISPOSABLE) ×4 IMPLANT
DRAPE C-ARM XRAY 36X54 (DRAPES) IMPLANT
DRAPE COLUMN DVNC XI (DISPOSABLE) ×1 IMPLANT
ELECTRODE REM PT RTRN 9FT ADLT (ELECTROSURGICAL) ×1 IMPLANT
FORCEPS BPLR FENES DVNC XI (FORCEP) ×1 IMPLANT
FORCEPS PROGRASP DVNC XI (FORCEP) ×1 IMPLANT
GLOVE BIO SURGEON STRL SZ 6.5 (GLOVE) ×2 IMPLANT
GLOVE BIOGEL PI IND STRL 6.5 (GLOVE) ×2 IMPLANT
GLOVE SURG SYN 6.5 PF PI (GLOVE) ×2 IMPLANT
GOWN STRL REUS W/ TWL LRG LVL3 (GOWN DISPOSABLE) ×4 IMPLANT
GRASPER SUT TROCAR 14GX15 (MISCELLANEOUS) ×1 IMPLANT
IRRIGATOR SUCT 8 DISP DVNC XI (IRRIGATION / IRRIGATOR) IMPLANT
IV 0.9% NACL 1000 ML (IV SOLUTION) IMPLANT
IV CATH ANGIO 12GX3 LT BLUE (NEEDLE) IMPLANT
KIT PINK PAD W/HEAD ARM REST (MISCELLANEOUS) ×1 IMPLANT
LABEL OR SOLS (LABEL) ×1 IMPLANT
MANIFOLD NEPTUNE II (INSTRUMENTS) IMPLANT
NEEDLE HYPO 22X1.5 SAFETY MO (MISCELLANEOUS) ×1 IMPLANT
NEEDLE INSUFFLATION 14GA 120MM (NEEDLE) ×1 IMPLANT
NS IRRIG 500ML POUR BTL (IV SOLUTION) ×1 IMPLANT
OBTURATOR OPTICALSTD 8 DVNC (TROCAR) ×1 IMPLANT
PACK LAP CHOLECYSTECTOMY (MISCELLANEOUS) ×1 IMPLANT
SEAL UNIV 5-12 XI (MISCELLANEOUS) ×4 IMPLANT
SET TUBE SMOKE EVAC HIGH FLOW (TUBING) ×1 IMPLANT
SOLN STERILE WATER 500 ML (IV SOLUTION) ×1 IMPLANT
SOLUTION ELECTROSURG ANTI STCK (MISCELLANEOUS) ×1 IMPLANT
SPIKE FLUID TRANSFER (MISCELLANEOUS) ×2 IMPLANT
SPONGE T-LAP 4X18 ~~LOC~~+RFID (SPONGE) IMPLANT
SUT VICRYL 0 UR6 27IN ABS (SUTURE) ×1 IMPLANT
SUTURE MNCRL 4-0 27XMF (SUTURE) ×1 IMPLANT
SYSTEM BAG RETRIEVAL 10MM (BASKET) ×1 IMPLANT

## 2024-11-02 NOTE — Transfer of Care (Signed)
 Immediate Anesthesia Transfer of Care Note  Patient: [name]  Procedure(s) Performed: CHOLECYSTECTOMY, ROBOT-ASSISTED, LAPAROSCOPIC  Patient Location: PACU  Anesthesia Type:General  Level of Consciousness: awake, alert , and oriented  Airway & Oxygen Therapy: Patient Spontanous Breathing and Patient connected to face mask oxygen  Post-op Assessment: Report given to RN and Post -op Vital signs reviewed and stable  Post vital signs: stable  Last Vitals:  Vitals Value Taken Time  BP 130/79 11/02/24 15:37  Temp    Pulse 106 11/02/24 15:39  Resp 29 11/02/24 15:39  SpO2 93 % 11/02/24 15:39  Vitals shown include unfiled device data.  Last Pain:  Vitals:   11/02/24 1314  TempSrc: Temporal  PainSc: 0-No pain         Complications: No notable events documented.

## 2024-11-02 NOTE — Op Note (Signed)
 Preoperative diagnosis: Acute cholecystitis  Postoperative diagnosis: Same  Procedure: Robotic Assisted Laparoscopic Cholecystectomy.   Anesthesia: GETA   Surgeon: Dr. Cesar Coe  Wound Classification: Clean Contaminated  Indications: Patient is a 48 y.o. female developed right upper quadrant pain and on workup was found to have cholelithiasis with cholecystitis. Robotic Assisted Laparoscopic cholecystectomy was elected.  Findings: Severe pericholecystic fluid Critical view of safety achieved Cystic duct and artery identified, ligated and divided Adequate hemostasis               Description of procedure: The patient was placed on the operating table in the supine position. General anesthesia was induced. A time-out was completed verifying correct patient, procedure, site, positioning, and implant(s) and/or special equipment prior to beginning this procedure. An orogastric tube was placed. The abdomen was prepped and draped in the usual sterile fashion.  An incision was made in a natural skin line below the umbilicus.  The fascia was elevated and the Veress needle inserted. Proper position was confirmed by aspiration and saline meniscus test.  The abdomen was insufflated with carbon dioxide to a pressure of 15 mmHg. The patient tolerated insufflation well. A 8-mm trocar was then inserted in optiview fashion.  The laparoscope was inserted and the abdomen inspected. No injuries from initial trocar placement were noted. Additional trocars were then inserted in the following locations: an 8-mm trocar in the left lateral abdomen, and another two 8-mm trocars to the right side of the abdomen 5 cm appart. The umbilical trocar was changed to a 12 mm trocar all under direct visualization. The abdomen was inspected and no abnormalities were found. The table was placed in the reverse Trendelenburg position with the right side up. The robotic arms were docked and target anatomy identified.  Instrument inserted under direct visualization.  Filmy adhesions between the gallbladder and omentum, duodenum and transverse colon were lysed with electrocautery. The dome of the gallbladder was grasped with a prograsp and retracted over the dome of the liver. The infundibulum was also grasped with an atraumatic grasper and retracted toward the right lower quadrant. This maneuver exposed Calots triangle. The peritoneum overlying the gallbladder infundibulum was then incised and the cystic duct and cystic artery identified and circumferentially dissected. Critical view of safety reviewed before ligating any structure. Firefly images taken to visualize biliary ducts. The cystic duct and cystic artery were then doubly clipped and divided close to the gallbladder.  The gallbladder was then dissected from its peritoneal attachments by electrocautery. Hemostasis was checked and the gallbladder and contained stones were removed using an endoscopic retrieval bag. The gallbladder was passed off the table as a specimen.  There was no evidence of bleeding from the gallbladder fossa or cystic artery or leakage of the bile from the cystic duct stump. Secondary trocars were removed under direct vision. No bleeding was noted. The robotic arms were undoked. The scope was withdrawn and the umbilical trocar removed. The abdomen was allowed to collapse. The fascia of the 12mm trocar sites was closed with figure-of-eight 0 vicryl sutures. The skin was closed with subcuticular sutures of 4-0 monocryl and topical skin adhesive. The orogastric tube was removed.  The patient tolerated the procedure well and was taken to the postanesthesia care unit in stable condition.   Specimen: Gallbladder  Complications: None  EBL: 10 mL

## 2024-11-02 NOTE — Anesthesia Postprocedure Evaluation (Signed)
"   Anesthesia Post Note  Patient: [name]  Procedure(s) Performed: CHOLECYSTECTOMY, ROBOT-ASSISTED, LAPAROSCOPIC  Patient location during evaluation: PACU Anesthesia Type: General Level of consciousness: awake and alert Pain management: pain level controlled Vital Signs Assessment: post-procedure vital signs reviewed and stable Respiratory status: spontaneous breathing, nonlabored ventilation and respiratory function stable Cardiovascular status: blood pressure returned to baseline and stable Postop Assessment: no apparent nausea or vomiting Anesthetic complications: no   There were no known notable events for this encounter.   Last Vitals:  Vitals:   11/02/24 1645 11/02/24 1655  BP: 115/73 116/72  Pulse: 100   Resp: 16 16  Temp: 36.9 C 36.9 C  SpO2: 96% 94%    Last Pain:  Vitals:   11/02/24 1717  TempSrc:   PainSc: 0-No pain                 [name]      "

## 2024-11-02 NOTE — Anesthesia Procedure Notes (Addendum)
 Procedure Name: Intubation Date/Time: 11/02/2024 2:24 PM  Performed by: Birtha Cheron BRAVO, CRNAPre-anesthesia Checklist: Patient identified, Emergency Drugs available, Suction available, Patient being monitored and Timeout performed Patient Re-evaluated:Patient Re-evaluated prior to induction Oxygen Delivery Method: Circle system utilized Preoxygenation: Pre-oxygenation with 100% oxygen Induction Type: IV induction Ventilation: Mask ventilation without difficulty Laryngoscope Size: McGrath and 3 Grade View: Grade I Tube type: Oral Tube size: 7.0 mm Number of attempts: 1 Airway Equipment and Method: Stylet Placement Confirmation: ETT inserted through vocal cords under direct vision, positive ETCO2 and breath sounds checked- equal and bilateral Secured at: 20 cm Tube secured with: Tape Dental Injury: Teeth and Oropharynx as per pre-operative assessment

## 2024-11-02 NOTE — ED Triage Notes (Addendum)
 Pt reports upper abd pain that began earlier tonight pain is worse with inspiration, pt reports hx right side HF. Pt took pepto at home with no relief. Pt reports some nausea.

## 2024-11-02 NOTE — Anesthesia Preprocedure Evaluation (Signed)
 "                                  Anesthesia Evaluation  Patient identified by MRN, date of birth, ID band Patient awake    Reviewed: Allergy & Precautions, H&P , NPO status , Patient's Chart, lab work & pertinent test results  Airway Mallampati: II  TM Distance: >3 FB Neck ROM: full    Dental no notable dental hx.    Pulmonary asthma , sleep apnea (mild) , former smoker Obstructive lung disease picture consistent with moderate persistent asthma -followed by pulmonology lastly 10/25   Pulmonary exam normal        Cardiovascular pulmonary hypertension (WHO functional class I - Currently on tadalafil  40 daily and Opsumit  10. Tolerating well)Normal cardiovascular exam  H/o PHTN and low CI found on heart cath 07/2023 after hypoxia and volume overload were complications of a hysterectomy 9/24. She since has been treated by cardiology and pulmonology with no current work-up pending.   Per cardiology note 07/2024- Chronic RV Failure/cor pulmonale -Resolved  Doing very well. Reveal Lite Risk Score is 3 (low risk)  Echo normalized, minimal symptoms, would continue current therapy - Repeat echo in 1 year to follow       ECG: 07/29/23: sinus tachycardia  as per my personal interpretation   ECHO: 07/19/23: LVEF 55-60%, Grade II DD, RV moderately enlarged with mild to moderately reduced function as per my personal interpretation 05/11/2024: LVEF 60-65%, normal diastolic, normal RV function and size, unable to estimate RVSP   CATH: 9/16 Ao = 102/71 (85) LV = 101/6 RA = 4  RV = 70/7 PA = 71/27 (41) PCW = 4 Fick cardiac output/index = 3.6/2.1 Thermo CO/CI = 3.2/1.8 PVR = 10.2 WU FA sat = 95% PA sat = 57%, 59% PAPi = 11    Assessment: 1. Mild non-obstructive CAD  2. LVEF 55-60% 3. Moderate PAH with moderate to severely reduced CO   V/Q SCAN (07/23/23) Slight heterogeneous pulmonary perfusion but no wedge-shaped perfusion defects to suggest pulmonary embolus.     Neuro/Psych negative neurological ROS  negative psych ROS   GI/Hepatic Neg liver ROS,,,acute cholecystitis   Endo/Other  negative endocrine ROS    Renal/GU      Musculoskeletal   Abdominal   Peds  Hematology negative hematology ROS (+)   Anesthesia Other Findings Past Medical History: No date: Abnormal thyroid  function test No date: Anemia No date: Anxiety 06/2023: Bronchitis     Comment:  finished prednisone  and currently taking Amoxicillin as               of 06-12-23 No date: Cervical lymphadenopathy No date: Chronic back pain No date: Ectopic pregnancy No date: Headache     Comment:  MIGRAINES No date: History of kidney stones No date: HSV infection No date: Lumbar radiculopathy No date: Menorrhagia No date: Palpitations No date: Pneumonia     Comment:  06/2023 No date: PONV (postoperative nausea and vomiting)     Comment:  DURING KIDNEY STONE REMOVAL 2014: Seizures (HCC)     Comment:  one occurence-due to Tramadol No date: UTI (urinary tract infection)  Past Surgical History: 10/22/2019: CARPAL TUNNEL RELEASE; Left     Comment:  Procedure: CARPAL TUNNEL RELEASE;  Surgeon: Kathlynn Sharper, MD;  Location: [facility];  Service: Orthopedics;  Laterality: Left; No date: CYSTOSCOPY/RETROGRADE/URETEROSCOPY 11/01/2011: DILATION AND CURETTAGE OF UTERUS     Comment:  Procedure: DILATATION AND CURETTAGE;  Surgeon: Krystal JONETTA Deaner, MD;  Location: [facility];  Service: Gynecology;                Laterality: N/A; 12/31/2019: HARDWARE REMOVAL; Left     Comment:  Procedure: LEFT WRIST HARDWARE REMOVAL;  Surgeon: Kathlynn Sharper, MD;  Location: [facility];  Service: Orthopedics;               Laterality: Left; 07/15/2015: LAPAROSCOPIC TUBAL LIGATION; Bilateral     Comment:  Procedure: LAPAROSCOPIC TUBAL LIGATION;  Surgeon: Lamar SHAUNNA Lesches, MD;  Location: [facility];  Service: Gynecology;               Laterality:  Bilateral; 07/18/2023: LAPAROSCOPIC VAGINAL HYSTERECTOMY WITH SALPINGECTOMY; Right     Comment:  Procedure: LAPAROSCOPIC ASSISTED VAGINAL HYSTERECTOMY               WITH RIGHT SALPINGECTOMY;  Surgeon: Schermerhorn, Debby PARAS, MD;  Location: [facility];  Service: Gynecology;                Laterality: Right; 11/01/2011: LAPAROSCOPY     Comment:  Procedure: LAPAROSCOPY OPERATIVE;  Surgeon: Krystal JONETTA Deaner, MD;  Location: [facility];  Service: Gynecology;                Laterality: N/A; 2008: LAPAROSCOPY FOR ECTOPIC PREGNANCY     Comment:  mtx also No date: LITHOTRIPSY 10/22/2019: OPEN REDUCTION INTERNAL FIXATION (ORIF) DISTAL RADIAL  FRACTURE; Left     Comment:  Procedure: OPEN REDUCTION INTERNAL FIXATION (ORIF)               DISTAL RADIAL FRACTURE;  Surgeon: Kathlynn Sharper, MD;                Location: [facility];  Service: Orthopedics;  Laterality:               Left; 07/22/2023: RIGHT/LEFT HEART CATH AND CORONARY ANGIOGRAPHY; N/A     Comment:  Procedure: RIGHT/LEFT HEART CATH AND CORONARY               ANGIOGRAPHY;  Surgeon: Cherrie Toribio SAUNDERS, MD;                Location: [facility];  Service: Cardiovascular;              Laterality: N/A; No date: TUBAL LIGATION  BMI    Body Mass Index: 25.69 kg/m      Reproductive/Obstetrics negative OB ROS                              Anesthesia Physical Anesthesia Plan  ASA: 3  Anesthesia Plan: General ETT   Post-op Pain Management: Minimal or no pain anticipated   Induction: Intravenous and Rapid sequence  PONV Risk Score and Plan: 2 and Ondansetron , Dexamethasone  and Midazolam   Airway Management Planned: Oral ETT  Additional Equipment:   Intra-op Plan:  Post-operative Plan: Extubation in OR  Informed Consent:      Dental Advisory Given  Plan Discussed with: CRNA and Surgeon  Anesthesia Plan Comments:          Anesthesia Quick Evaluation  "

## 2024-11-02 NOTE — ED Notes (Signed)
 Report given to Lela, RN at Pre-Op

## 2024-11-02 NOTE — Consult Note (Signed)
 Initial Consultation Note   Patient: [name] FMW:[id number] DOB: May 05, 1976 PCP: Hampton, Lizet, PA DOA: 11/02/2024 DOS: the patient was seen and examined on 11/02/2024 Primary service: Rodolph Romano, MD  Referring physician: Dr. Cesar Reason for consult: History of pulmonary hypertension  Assessment/Plan: Assessment and Plan: History of pulmonary hypertension. Per patient, she was having a scheduled cholecystectomy in early this year, at that time, she had a right sided heart failure.  Echocardiogram that time showed a mild pulmonary hypertension with decreased right side heart function.  Since then, she has not had any symptoms, echocardiogram performed in July showed a normal right heart function.  Currently patient does not seem to have any heart issue, no risk for surgery.  Cholecystitis. Followed by general surgery, scheduled for surgery, currently on antibiotics.      Will follow-up 1 more time postop.  HPI: [name] is a 48 y.o. female with past medical history of anxiety, history of right-sided heart failure, who presents to the hospital with complaints of right upper abdominal pain, nausea vomiting, subjective fever.  She is seen by general surgery, scheduled on cholecystectomy and placed on antibiotics.  Currently, patient doing well, no fever or chills, no nausea vomiting.  She does not have any short of breath, no paroxysmal active dyspnea or orthopnea.  No leg edema.  Review of Systems: As mentioned in the history of present illness. All other systems reviewed and are negative. Past Medical History:  Diagnosis Date   Abnormal thyroid  function test    Anemia    Anxiety    Bronchitis 06/2023   finished prednisone  and currently taking Amoxicillin as of 06-12-23   Cervical lymphadenopathy    Chronic back pain    Ectopic pregnancy    Headache    MIGRAINES   History of kidney stones    HSV infection    Lumbar radiculopathy    Menorrhagia     Palpitations    Pneumonia    06/2023   PONV (postoperative nausea and vomiting)    DURING KIDNEY STONE REMOVAL   Seizures (HCC) 2014   one occurence-due to Tramadol   UTI (urinary tract infection)    Past Surgical History:  Procedure Laterality Date   CARPAL TUNNEL RELEASE Left 10/22/2019   Procedure: CARPAL TUNNEL RELEASE;  Surgeon: Kathlynn Sharper, MD;  Location: [facility];  Service: Orthopedics;  Laterality: Left;   CYSTOSCOPY/RETROGRADE/URETEROSCOPY     DILATION AND CURETTAGE OF UTERUS  11/01/2011   Procedure: DILATATION AND CURETTAGE;  Surgeon: Krystal JONETTA Deaner, MD;  Location: [facility];  Service: Gynecology;  Laterality: N/A;   HARDWARE REMOVAL Left 12/31/2019   Procedure: LEFT WRIST HARDWARE REMOVAL;  Surgeon: Kathlynn Sharper, MD;  Location: [facility];  Service: Orthopedics;  Laterality: Left;   LAPAROSCOPIC TUBAL LIGATION Bilateral 07/15/2015   Procedure: LAPAROSCOPIC TUBAL LIGATION;  Surgeon: Lamar SHAUNNA Lesches, MD;  Location: [facility];  Service: Gynecology;  Laterality: Bilateral;   LAPAROSCOPIC VAGINAL HYSTERECTOMY WITH SALPINGECTOMY Right 07/18/2023   Procedure: LAPAROSCOPIC ASSISTED VAGINAL HYSTERECTOMY WITH RIGHT SALPINGECTOMY;  Surgeon: Schermerhorn, Debby PARAS, MD;  Location: [facility];  Service: Gynecology;  Laterality: Right;   LAPAROSCOPY  11/01/2011   Procedure: LAPAROSCOPY OPERATIVE;  Surgeon: Krystal JONETTA Deaner, MD;  Location: [facility];  Service: Gynecology;  Laterality: N/A;   LAPAROSCOPY FOR ECTOPIC PREGNANCY  2008   mtx also   LITHOTRIPSY     OPEN REDUCTION INTERNAL FIXATION (ORIF) DISTAL RADIAL FRACTURE Left 10/22/2019   Procedure: OPEN REDUCTION INTERNAL FIXATION (ORIF)  DISTAL RADIAL FRACTURE;  Surgeon: Kathlynn Sharper, MD;  Location: [facility];  Service: Orthopedics;  Laterality: Left;   RIGHT/LEFT HEART CATH AND CORONARY ANGIOGRAPHY N/A 07/22/2023   Procedure: RIGHT/LEFT HEART CATH AND CORONARY ANGIOGRAPHY;  Surgeon: Cherrie Toribio SAUNDERS, MD;  Location: [facility];  Service:  Cardiovascular;  Laterality: N/A;   TUBAL LIGATION     Social History:  reports that she quit smoking about 5 years ago. Her smoking use included cigarettes. She has never used smokeless tobacco. She reports that she does not drink alcohol and does not use drugs.  Allergies[1]  Family History  Problem Relation Age of Onset   Cancer Maternal Grandmother     Prior to Admission medications  Medication Sig Start Date End Date Taking? Authorizing Provider  budesonide -glycopyrrolate -formoterol  (BREZTRI  AEROSPHERE) 160-9-4.8 MCG/ACT AERO inhaler Inhale 2 puffs into the lungs in the morning and at bedtime. 08/13/24  Yes Assaker, Darrin, MD  dapagliflozin  propanediol (FARXIGA ) 10 MG TABS tablet Take 1 tablet (10 mg total) by mouth daily. 09/15/24  Yes Bensimhon, Toribio SAUNDERS, MD  fluticasone  (FLONASE ) 50 MCG/ACT nasal spray Place 1 spray into both nostrils daily. 10/23/23  Yes Assaker, Darrin, MD  macitentan  (OPSUMIT ) 10 MG tablet TAKE 1 TABLET DAILY 07/13/24  Yes Bensimhon, Toribio SAUNDERS, MD  rosuvastatin  (CRESTOR ) 10 MG tablet TAKE 1 TABLET BY MOUTH EVERY DAY 02/25/24  Yes Sabharwal, Aditya, DO  Semaglutide -Weight Management 2.4 MG/0.75ML SOAJ Inject 2.4 mg into the skin once a week. 02/25/24  Yes Bensimhon, Toribio SAUNDERS, MD  tadalafil , PAH, (ADCIRCA ) 20 MG tablet Take 2 tablets (40 mg total) by mouth daily. 10/21/24  Yes Bensimhon, Toribio SAUNDERS, MD  VENTOLIN  HFA 108 (90 Base) MCG/ACT inhaler Inhale 1-2 puffs into the lungs every 4 (four) hours as needed for wheezing. 08/27/23  Yes [provider]  amoxicillin (AMOXIL) 500 MG capsule Take 500 mg by mouth 4 (four) times daily. Patient not taking: Reported on 11/02/2024 09/29/24   [provider]  chlorhexidine  (PERIDEX ) 0.12 % solution 0.5 Ounce(s) Twice Daily Patient not taking: Reported on 11/02/2024 10/14/24   [provider]  naproxen (NAPROSYN) 250 MG tablet Take by mouth. Patient not taking: Reported on 11/02/2024 09/29/24    [provider]    Physical Exam: Vitals:   11/02/24 0130 11/02/24 0356 11/02/24 0803 11/02/24 1246  BP: (!) 166/108 (!) 134/92 134/82   Pulse: (!) 108 91 96   Resp: 20 17 16    Temp: 98.8 F (37.1 C) 98.5 F (36.9 C) 99 F (37.2 C) 98.8 F (37.1 C)  TempSrc:   Oral Oral  SpO2: 98% 96% 94%   Weight: 65.8 kg     Height: 5' 3 (1.6 m)      Physical Exam Constitutional:      General: She is not in acute distress.    Appearance: She is well-developed and normal weight. She is not toxic-appearing.  HENT:     Head: Normocephalic and atraumatic.     Mouth/Throat:     Mouth: Mucous membranes are moist.  Eyes:     Extraocular Movements: Extraocular movements intact.     Pupils: Pupils are equal, round, and reactive to light.  Cardiovascular:     Rate and Rhythm: Normal rate and regular rhythm.     Heart sounds: No murmur heard. Pulmonary:     Effort: Pulmonary effort is normal. No respiratory distress.     Breath sounds: Normal breath sounds. No wheezing or rales.  Abdominal:  General: Abdomen is flat. Bowel sounds are normal. There is no distension.     Palpations: Abdomen is soft.     Tenderness: There is abdominal tenderness in the right upper quadrant.  Skin:    General: Skin is warm.     Coloration: Skin is not cyanotic.  Neurological:     General: No focal deficit present.     Mental Status: She is alert.  Psychiatric:        Mood and Affect: Mood normal.        Behavior: Behavior normal.     Data Reviewed:   Reviewed echocardiogram results, ultrasound results and lab results.   Family Communication: None Primary team communication: Communicated with Dr. Cesar via secure chat Thank you very much for involving us  in the care of your patient.  Author: Murvin Mana, MD 11/02/2024 12:54 PM  For on call review [email or web address].     [1]  Allergies Allergen Reactions   Sulfa Antibiotics Hives and Itching   Tramadol Other (See Comments)     seizures   Compazine [Prochlorperazine] Anxiety    Tachycardia

## 2024-11-02 NOTE — H&P (Signed)
 "  SURGICAL HISTORY AND PHYSICAL NOTE   HISTORY OF PRESENT ILLNESS (HPI):  48 y.o. female presented to [facility] ED for evaluation of epigastric and central chest pain. Patient reports she started having upper abdominal pain since last night.  Pain on the central upper abdomen that radiates to behind her right breast.  Patient cannot identify any aggravating factor.  There is no alleviating factors.  At the ER she was found with stable vital signs with mild tachycardia no fever.  Labs shows white blood cell count of 10.0 hemoglobin of 14.4.  CMP with no significant electrolyte disturbances.  Total bilirubin 0.5.  She has abdominal ultrasound that shows gallbladder wall thickening and positive Murphy sign's with cholelithiasis.  I personally evaluated the images  Surgery is consulted by Dr. Arlander in this context for evaluation and management of acute cholecystitis.  PAST MEDICAL HISTORY (PMH):  Past Medical History:  Diagnosis Date   Abnormal thyroid  function test    Anemia    Anxiety    Bronchitis 06/2023   finished prednisone  and currently taking Amoxicillin as of 06-12-23   Cervical lymphadenopathy    Chronic back pain    Ectopic pregnancy    Headache    MIGRAINES   History of kidney stones    HSV infection    Lumbar radiculopathy    Menorrhagia    Palpitations    Pneumonia    06/2023   PONV (postoperative nausea and vomiting)    DURING KIDNEY STONE REMOVAL   Seizures (HCC) 2014   one occurence-due to Tramadol   UTI (urinary tract infection)      PAST SURGICAL HISTORY (PSH):  Past Surgical History:  Procedure Laterality Date   CARPAL TUNNEL RELEASE Left 10/22/2019   Procedure: CARPAL TUNNEL RELEASE;  Surgeon: Kathlynn Sharper, MD;  Location: [facility];  Service: Orthopedics;  Laterality: Left;   CYSTOSCOPY/RETROGRADE/URETEROSCOPY     DILATION AND CURETTAGE OF UTERUS  11/01/2011   Procedure: DILATATION AND CURETTAGE;  Surgeon: Krystal JONETTA Deaner, MD;  Location: [facility];  Service:  Gynecology;  Laterality: N/A;   HARDWARE REMOVAL Left 12/31/2019   Procedure: LEFT WRIST HARDWARE REMOVAL;  Surgeon: Kathlynn Sharper, MD;  Location: [facility];  Service: Orthopedics;  Laterality: Left;   LAPAROSCOPIC TUBAL LIGATION Bilateral 07/15/2015   Procedure: LAPAROSCOPIC TUBAL LIGATION;  Surgeon: Lamar SHAUNNA Lesches, MD;  Location: [facility];  Service: Gynecology;  Laterality: Bilateral;   LAPAROSCOPIC VAGINAL HYSTERECTOMY WITH SALPINGECTOMY Right 07/18/2023   Procedure: LAPAROSCOPIC ASSISTED VAGINAL HYSTERECTOMY WITH RIGHT SALPINGECTOMY;  Surgeon: Schermerhorn, Debby PARAS, MD;  Location: [facility];  Service: Gynecology;  Laterality: Right;   LAPAROSCOPY  11/01/2011   Procedure: LAPAROSCOPY OPERATIVE;  Surgeon: Krystal JONETTA Deaner, MD;  Location: [facility];  Service: Gynecology;  Laterality: N/A;   LAPAROSCOPY FOR ECTOPIC PREGNANCY  2008   mtx also   LITHOTRIPSY     OPEN REDUCTION INTERNAL FIXATION (ORIF) DISTAL RADIAL FRACTURE Left 10/22/2019   Procedure: OPEN REDUCTION INTERNAL FIXATION (ORIF) DISTAL RADIAL FRACTURE;  Surgeon: Kathlynn Sharper, MD;  Location: [facility];  Service: Orthopedics;  Laterality: Left;   RIGHT/LEFT HEART CATH AND CORONARY ANGIOGRAPHY N/A 07/22/2023   Procedure: RIGHT/LEFT HEART CATH AND CORONARY ANGIOGRAPHY;  Surgeon: Cherrie Toribio SAUNDERS, MD;  Location: [facility];  Service: Cardiovascular;  Laterality: N/A;   TUBAL LIGATION       MEDICATIONS:  Prior to Admission medications  Medication Sig Start Date End Date Taking? Authorizing Provider  amoxicillin (AMOXIL) 500 MG capsule Take 500  mg by mouth 4 (four) times daily. 09/29/24  Yes [provider]  chlorhexidine  (PERIDEX ) 0.12 % solution 0.5 Ounce(s) Twice Daily 10/14/24  Yes [provider]  naproxen (NAPROSYN) 250 MG tablet Take by mouth. 09/29/24  Yes [provider]  budesonide -glycopyrrolate -formoterol  (BREZTRI  AEROSPHERE) 160-9-4.8 MCG/ACT AERO inhaler Inhale 2 puffs into the lungs in the  morning and at bedtime. 08/13/24   Assaker, Darrin, MD  dapagliflozin  propanediol (FARXIGA ) 10 MG TABS tablet Take 1 tablet (10 mg total) by mouth daily. 09/15/24   Bensimhon, Toribio SAUNDERS, MD  fluticasone  (FLONASE ) 50 MCG/ACT nasal spray Place 1 spray into both nostrils daily. 10/23/23   Assaker, Darrin, MD  macitentan  (OPSUMIT ) 10 MG tablet TAKE 1 TABLET DAILY 07/13/24   Bensimhon, Toribio SAUNDERS, MD  rosuvastatin  (CRESTOR ) 10 MG tablet TAKE 1 TABLET BY MOUTH EVERY DAY 02/25/24   Sabharwal, Aditya, DO  Semaglutide -Weight Management 2.4 MG/0.75ML SOAJ Inject 2.4 mg into the skin once a week. 02/25/24   Bensimhon, Toribio SAUNDERS, MD  tadalafil , PAH, (ADCIRCA ) 20 MG tablet Take 2 tablets (40 mg total) by mouth daily. 10/21/24   Bensimhon, Toribio SAUNDERS, MD  VENTOLIN  HFA 108 (90 Base) MCG/ACT inhaler Inhale 1-2 puffs into the lungs every 4 (four) hours as needed for wheezing. 08/27/23   [provider]     ALLERGIES:  Allergies[1]   SOCIAL HISTORY:  Social History   Socioeconomic History   Marital status: Divorced    Spouse name: Not on file   Number of children: Not on file   Years of education: Not on file   Highest education level: Not on file  Occupational History   Not on file  Tobacco Use   Smoking status: Former    Current packs/day: 0.00    Types: Cigarettes    Quit date: 10/21/2019    Years since quitting: 5.0   Smokeless tobacco: Never  Vaping Use   Vaping status: Former   Quit date: 06/06/2023   Substances: Nicotine, Flavoring  Substance and Sexual Activity   Alcohol use: No   Drug use: No   Sexual activity: Yes  Other Topics Concern   Not on file  Social History Narrative   Not on file   Social Drivers of Health   Tobacco Use: Medium Risk (11/02/2024)   Patient History    Smoking Tobacco Use: Former    Smokeless Tobacco Use: Never    Passive Exposure: Not on Actuary Strain: Not on file  Food Insecurity: No Food Insecurity (07/21/2023)   Hunger  Vital Sign    Worried About Running Out of Food in the Last Year: Never true    Ran Out of Food in the Last Year: Never true  Transportation Needs: No Transportation Needs (07/21/2023)   PRAPARE - Administrator, Civil Service (Medical): No    Lack of Transportation (Non-Medical): No  Physical Activity: Not on file  Stress: Not on file  Social Connections: Not on file  Intimate Partner Violence: Not At Risk (07/21/2023)   Humiliation, Afraid, Rape, and Kick questionnaire    Fear of Current or Ex-Partner: No    Emotionally Abused: No    Physically Abused: No    Sexually Abused: No  Depression (PHQ2-9): Low Risk (09/10/2023)   Depression (PHQ2-9)    PHQ-2 Score: 2  Alcohol Screen: Not on file  Housing: Low Risk (07/21/2023)   Housing    Last Housing Risk Score: 0  Utilities: Not At Risk (07/21/2023)  [employer] Utilities    Threatened with loss of utilities: No  Health Literacy: Not on file     FAMILY HISTORY:  Family History  Problem Relation Age of Onset   Cancer Maternal Grandmother      REVIEW OF SYSTEMS:  Constitutional: denies weight loss, fever, chills, or sweats  Eyes: denies any other vision changes, history of eye injury  ENT: denies sore throat, hearing problems  Respiratory: denies shortness of breath, wheezing  Cardiovascular: denies chest pain, palpitations  Gastrointestinal: positive abdominal pain, nausea and vomitnig Genitourinary: denies burning with urination or urinary frequency Musculoskeletal: denies any other joint pains or cramps  Skin: denies any other rashes or skin discolorations  Neurological: denies any other headache, dizziness, weakness  Psychiatric: denies any other depression, anxiety   All other review of systems were negative   VITAL SIGNS:  Temp:  [98.5 F (36.9 C)-99 F (37.2 C)] 99 F (37.2 C) (12/29 0803) Pulse Rate:  [91-108] 96 (12/29 0803) Resp:  [16-20] 16 (12/29 0803) BP: (134-166)/(82-108) 134/82 (12/29 0803) SpO2:   [94 %-98 %] 94 % (12/29 0803) Weight:  [65.8 kg] 65.8 kg (12/29 0130)     Height: 5' 3 (160 cm) Weight: 65.8 kg BMI (Calculated): 25.69   INTAKE/OUTPUT:  This shift: No intake/output data recorded.  Last 2 shifts: @IOLAST2SHIFTS @   PHYSICAL EXAM:  Constitutional:  -- Normal body habitus  -- Awake, alert, and oriented x3  Eyes:  -- Pupils equally round and reactive to light  -- No scleral icterus  Ear, nose, and throat:  -- No jugular venous distension  Pulmonary:  -- No crackles  -- Equal breath sounds bilaterally -- Breathing non-labored at rest Cardiovascular:  -- S1, S2 present  -- No pericardial rubs Gastrointestinal:  -- Abdomen soft, tender in right upper quadrant, non-distended, no guarding or rebound tenderness  Musculoskeletal and Integumentary:  -- Wounds: None appreciated -- Extremities: B/L UE and LE FROM, hands and feet warm, no edema  Neurologic:  -- Motor function: intact and symmetric -- Sensation: intact and symmetric   Labs:     Latest Ref Rng & Units 11/02/2024    1:36 AM 07/23/2023    5:39 AM 07/22/2023    4:33 PM  CBC  WBC 4.0 - 10.5 K/uL 10.0  9.6    Hemoglobin 12.0 - 15.0 g/dL 85.5  86.3  86.3   Hematocrit 36.0 - 46.0 % 44.2  40.7  40.0   Platelets 150 - 400 K/uL 185  326        Latest Ref Rng & Units 11/02/2024    1:36 AM 02/11/2024   10:47 AM 07/29/2023   10:54 AM  CMP  Glucose 70 - 99 mg/dL 878  66  883   BUN 6 - 20 mg/dL 14  6  9    Creatinine 0.44 - 1.00 mg/dL 9.11  9.26  9.27   Sodium 135 - 145 mmol/L 139  142  142   Potassium 3.5 - 5.1 mmol/L 4.1  3.8  4.5   Chloride 98 - 111 mmol/L 104  103  106   CO2 22 - 32 mmol/L 22  21  20    Calcium  8.9 - 10.3 mg/dL 9.3  9.2  9.4   Total Protein 6.5 - 8.1 g/dL 7.1   6.5   Total Bilirubin 0.0 - 1.2 mg/dL 0.5   0.4   Alkaline Phos 38 - 126 U/L 68   64   AST 15 - 41 U/L 16  20   ALT 0 - 44 U/L 14   17     Imaging studies:  EXAM: Right Upper Quadrant Abdominal Ultrasound 11/02/2024  09:16:26 AM   TECHNIQUE: Real-time ultrasonography of the right upper quadrant of the abdomen was performed.   COMPARISON: US  Abdomen limited 03/01/2021.   CLINICAL HISTORY: Upper abdominal pain.   FINDINGS:   LIVER: Mildly echogenic liver parenchyma, suggestive of hepatic steatosis. Small hepatic cysts measuring up to 1.2 x 1.2 x 0.7 cm in the left hepatic lobe and 1.1 x 0.8 x 0.8 cm in the right hepatic lobe. No intrahepatic biliary ductal dilatation. No evidence of mass. Hepatopetal flow in the portal vein.   BILIARY SYSTEM: Gallbladder wall thickness measures 4 mm. Likely tiny hyperechoic stones in the gallbladder. No pericholecystic fluid. The common bile duct is within normal limits measuring 5 mm. Reported positive sonographic murphy sign per technologist report.   OTHER: No right upper quadrant ascites.   IMPRESSION: 1. Cholelithiasis and mild gallbladder wall thickening with reported positive sonographic murphy's sign . Findings suggestive of acute cholecystitis in the appropriate clinical setting. 2. Likely mild hepatic steatosis.   Electronically signed by: Michaeline Blanch MD 11/02/2024 11:31 AM EST RP Workstation: [id number]  Assessment/Plan:  48 y.o. female with acute cholecystitis, complicated by pertinent comorbidities including coronary artery disease, pulmonary hypertension, heart failure.  Patient with history, physical exam and images consistent with acute cholecystitis. Patient oriented about diagnosis and surgical management as treatment.   Discussed the risk of surgery including post-op infxn, seroma, biloma, chronic pain, poor-delayed wound healing, retained gallstone, conversion to open procedure, post-op SBO or ileus, and need for additional procedures to address said risks.  The risks of general anesthetic including MI, CVA, sudden death or even reaction to anesthetic medications also discussed. Alternatives include continued observation.  Benefits  include possible symptom relief, prevention of complications including acute cholecystitis, pancreatitis.  Pulmonary hypertension - Open evaluation of last cardiology visit, patient seems to be optimized and stable - Discussed care with anesthesia, which I agree that patient needs reasonable to proceed with cholecystectomy in this institution - Consult hospitalist for further evaluation and management - Patient will be able to continue with her home meds  I personally spent a total of 80 minutes in the care of the patient today including preparing to see the patient, getting/reviewing separately obtained history, performing a medically appropriate exam/evaluation, counseling and educating, placing orders, referring and communicating with other health care professionals, documenting clinical information in the EHR, independently interpreting results, communicating results, and coordinating care.   Lucas Petrin, MD     [1]  Allergies Allergen Reactions   Sulfa Antibiotics Hives and Itching   Tramadol Other (See Comments)    seizures   Compazine [Prochlorperazine] Anxiety    Tachycardia    "

## 2024-11-02 NOTE — ED Provider Notes (Signed)
 "  [facility] Provider Note    Event Date/Time   First MD Initiated Contact with Patient 11/02/24 [phone]     (approximate)   History   Abdominal Pain   HPI  [name] is a 48 y.o. female with a history of kidney stones, CAD who presents with right upper quadrant abdominal pain.  She denies chest pain.  She reports that started around 830 last night, ate dinner around 5:30 PM.  It has been ongoing throughout the night.  She reports pain that radiates to her shoulder blade on the right.     Physical Exam   Triage Vital Signs: ED Triage Vitals  Encounter Vitals Group     BP 11/02/24 0130 (!) 166/108     Girls Systolic BP Percentile --      Girls Diastolic BP Percentile --      Boys Systolic BP Percentile --      Boys Diastolic BP Percentile --      Pulse Rate 11/02/24 0130 (!) 108     Resp 11/02/24 0130 20     Temp 11/02/24 0130 98.8 F (37.1 C)     Temp src --      SpO2 11/02/24 0130 98 %     Weight 11/02/24 0130 65.8 kg (145 lb)     Height 11/02/24 0130 1.6 m (5' 3)     Head Circumference --      Peak Flow --      Pain Score 11/02/24 0129 8     Pain Loc --      Pain Education --      Exclude from Growth Chart --     Most recent vital signs: Vitals:   11/02/24 0356 11/02/24 0803  BP: (!) 134/92 134/82  Pulse: 91 96  Resp: 17 16  Temp: 98.5 F (36.9 C) 99 F (37.2 C)  SpO2: 96% 94%     General: Awake, uncomfortable CV:  Good peripheral perfusion.  Resp:  Normal effort.  Abd:  No distention.  Tenderness in the right upper quadrant, no CVA tenderness Other:     ED Results / Procedures / Treatments   Labs (all labs ordered are listed, but only abnormal results are displayed) Labs Reviewed  LIPASE, BLOOD - Abnormal; Notable for the following components:      Result Value   Lipase 60 (*)    All other components within normal limits  COMPREHENSIVE METABOLIC PANEL WITH GFR - Abnormal; Notable for the following components:    Glucose, Bld 121 (*)    All other components within normal limits  URINALYSIS, ROUTINE W REFLEX MICROSCOPIC - Abnormal; Notable for the following components:   Color, Urine YELLOW (*)    APPearance HAZY (*)    Glucose, UA >=500 (*)    Leukocytes,Ua TRACE (*)    Bacteria, UA RARE (*)    All other components within normal limits  CBC  TROPONIN T, HIGH SENSITIVITY  TROPONIN T, HIGH SENSITIVITY     EKG  ED ECG REPORT I, Lamar Price, the attending physician, personally viewed and interpreted this ECG.  Date: 11/02/2024  Rhythm: normal sinus rhythm QRS Axis: normal Intervals: normal ST/T Wave abnormalities: normal Narrative Interpretation: no evidence of acute ischemia    RADIOLOGY Ultrasound pending    PROCEDURES:  Critical Care performed:     Procedures   MEDICATIONS ORDERED IN ED: Medications  morphine  (PF) 4 MG/ML injection 4 mg (has no administration in time range)  morphine  (PF) 4 MG/ML injection 4 mg (4 mg Intravenous Given 11/02/24 0728)  ondansetron  (ZOFRAN ) injection 4 mg (4 mg Intravenous Given 11/02/24 0727)  sodium chloride  0.9 % bolus 500 mL (0 mLs Intravenous Stopped 11/02/24 0800)     IMPRESSION / MDM / ASSESSMENT AND PLAN / ED COURSE  I reviewed the triage vital signs and the nursing notes. Patient's presentation is most consistent with acute presentation with potential threat to life or bodily function.  Patient presents with abdominal pain as detailed above.  Differential includes cholecystitis, pancreatitis, gastritis  No chest pain, EKG high sensitive troponin are normal  Will treat with IV morphine , IV Zofran , IV fluids obtain ultrasound of the right upper quadrant and reevaluate.  Ultrasound is positive for acute cholecystitis, have discussed with Dr. Cesar of general surgery, he will admit the patient      FINAL CLINICAL IMPRESSION(S) / ED DIAGNOSES   Final diagnoses:  Cholecystitis     Rx / DC Orders   ED Discharge  Orders     None        Note:  This document was prepared using Dragon voice recognition software and may include unintentional dictation errors.   Arlander Charleston, MD 11/02/24 1154  "

## 2024-11-03 ENCOUNTER — Telehealth (HOSPITAL_COMMUNITY): Payer: Self-pay | Admitting: Pharmacist

## 2024-11-03 ENCOUNTER — Other Ambulatory Visit: Payer: Self-pay

## 2024-11-03 ENCOUNTER — Encounter: Payer: Self-pay | Admitting: Oncology

## 2024-11-03 ENCOUNTER — Other Ambulatory Visit (HOSPITAL_COMMUNITY): Payer: Self-pay

## 2024-11-03 DIAGNOSIS — K810 Acute cholecystitis: Secondary | ICD-10-CM | POA: Diagnosis not present

## 2024-11-03 DIAGNOSIS — I272 Pulmonary hypertension, unspecified: Secondary | ICD-10-CM | POA: Diagnosis not present

## 2024-11-03 MED ORDER — HYDROCODONE-ACETAMINOPHEN 5-325 MG PO TABS
1.0000 | ORAL_TABLET | Freq: Three times a day (TID) | ORAL | 0 refills | Status: AC | PRN
  Filled 2024-11-03: qty 10, 4d supply, fill #0

## 2024-11-03 NOTE — Progress Notes (Signed)
" °  Progress Note   Patient: [name] FMW:[id number] DOB: 1976/10/06 DOA: 11/02/2024     0 DOS: the patient was seen and examined on 11/03/2024   Brief hospital course:   [name] is a 48 y.o. female with past medical history of anxiety, history of right-sided heart failure, who presents to the hospital with complaints of right upper abdominal pain, nausea vomiting, subjective fever.  She is seen by general surgery, post cholecystectomy and placed on antibiotics   Principal Problem:   Acute cholecystitis   Assessment and Plan: Acute cholecystitis status post cholecystectomy Postop pain. Management by general surgery.  History of right side heart failure and pulm hypertension. Recent echocardiogram showed normal RV function.  Patient has refills of tadalafil  by cardiology. No evidence of exacerbation of congestive heart failure.  Will sign off.       Subjective:  Patient has some pain in the right upper quadrant..  No nausea vomiting.  Physical Exam: Vitals:   11/02/24 2020 11/03/24 0227 11/03/24 0227 11/03/24 0734  BP: 130/77 106/70  102/73  Pulse: (!) 109 (!) 109 (!) 106 (!) 101  Resp: 18 19  17   Temp: 98.4 F (36.9 C) 99 F (37.2 C)  98.5 F (36.9 C)  TempSrc: Oral Oral  Oral  SpO2: 93% (!) 89% 93% 91%  Weight:      Height:       General exam: Appears calm and comfortable  Respiratory system: Clear to auscultation. Respiratory effort normal. Cardiovascular system: S1 & S2 heard, RRR. No JVD, murmurs, rubs, gallops or clicks. No pedal edema. Gastrointestinal system: Abdomen is nondistended, soft and nontender. No organomegaly or masses felt. Normal bowel sounds heard. Central nervous system: Alert and oriented. No focal neurological deficits. Extremities: Symmetric 5 x 5 power. Skin: No rashes, lesions or ulcers Psychiatry: Judgement and insight appear normal. Mood & affect appropriate.    Data Reviewed:  Lab results reviewed.  Family Communication:  None  Disposition: Status is: Observation      Time spent: 35 minutes  Author: Murvin Mana, MD 11/03/2024 10:45 AM  For on call review [email or web address].    "

## 2024-11-03 NOTE — TOC CM/SW Note (Signed)
 Transition of Care [facility]) - Inpatient Brief Assessment   Patient Details  Name: [name] MRN: [id number] Date of Birth: Dec 22, 1975  Transition of Care [facility]) CM/SW Contact:    Corean ONEIDA Haddock, RN Phone Number: 11/03/2024, 9:04 AM   Clinical Narrative:  Transition of Care Department [facility]) has reviewed patient and no TOC needs have been identified at this time.  If new patient transition needs arise, please place a TOC consult.   Transition of Care Asessment: Insurance and Status: Insurance coverage has been reviewed Patient has primary care physician: Yes     Prior/Current Home Services: No current home services Social Drivers of Health Review: SDOH reviewed no interventions necessary Readmission risk has been reviewed: No (obs status no score generated) Transition of care needs: no transition of care needs at this time

## 2024-11-03 NOTE — Discharge Summary (Signed)
 [facility]-General Surgery  SURGICAL DISCHARGE SUMMARY  Patient ID: [name] MRN: [id number] DOB/AGE: 50-Sep-1977 50 y.o.  Admit date: 11/02/2024 Discharge date: 11/03/2024  Discharge Diagnoses Patient Active Problem List   Diagnosis Date Noted   Acute cholecystitis 11/02/2024   CAD S/P percutaneous coronary angioplasty 07/23/2023   Pulmonary HTN (HCC) 07/19/2023   Tachycardia 07/19/2023   Acute hypoxic respiratory failure (HCC) 07/18/2023   Chest pain 07/18/2023   Leukocytosis 07/18/2023   S/P hysterectomy 07/18/2023   Hepatic steatosis 07/18/2023   Abdominal pain 03/01/2021   Oligohydramnios antepartum 06/06/2015   Threatened preterm labor 05/20/2015   Decreased fetal movement    Preterm contractions    [redacted] weeks gestation of pregnancy    Anemia in pregnancy 05/04/2015   Cervical laceration 11/01/2011   SVD (spontaneous vaginal delivery) 10/31/2011   HSV (herpes simplex virus) infection 09/02/2011   AMA (advanced maternal age) multigravida 35+ 09/02/2011   Normal pregnancy 09/02/2011   Enlarged lymph nodes 09/06/2009   LOW BACK PAIN, ACUTE 09/02/2008   Anxiety state 08/03/2008   FATIGUE 08/03/2008   PALPITATIONS, RECURRENT 08/03/2008   ABNORMAL THYROID  FUNCTION TESTS 08/03/2008   NEPHROLITHIASIS, HX OF 08/03/2008   Migraine without aura 05/22/2007    Consultants Hospitalist   Procedures Robotic Assisted Laparoscopic Cholecystectomy    Hospital Course:  Patient presented to the [facility] ED on 11/02/2024 with epigastric and central pain.  Started experiencing upper abdominal pain the night before. In the ED, patient was overall stable, mild tachycardic, no fever.  No leukocytosis indicated. LFTs, alkaline phos and total bili within normal range.  No electrolyte disturbance noted. Lipase elevated 60. Ultrasound showed cholelithiasis with mild gallbladder wall thickening and positive sonographic Murphy sign suggestive of acute cholecystitis. Hospitalist was  consulted for patient's history of pulmonary hypertension for further evaluation and management. Patient was taken to the operating room later that afternoon for robotic assisted laparoscopic cholecystectomy. Surgery went well. Patient tolerated procedure.  Patient is now tolerating regular diet post-op and is able to ambulate. Surgical incisions show no signs of infection. Pain is well-controlled with pain medication. Patient is clear from surgical standpoint.  Patient will follow-up outpatient with Dr. Cesar in 2 weeks.     Physical Examination:  Constitutional: alert, in no acute distress Pulmonary: CTA bilaterally, normal breath sounds Cardiac: regular rate and rhythm Gastrointestinal: soft, generalized tenderness, and non-distended Skin: abdominal incisions look clean, dry, and intact with surgical glue in place    Allergies as of 11/03/2024       Reactions   Sulfa Antibiotics Hives, Itching   Tramadol Other (See Comments)   seizures   Compazine [prochlorperazine] Anxiety   Tachycardia        Medication List     TAKE these medications    Breztri  Aerosphere 160-9-4.8 MCG/ACT Aero inhaler Generic drug: budesonide -glycopyrrolate -formoterol  Inhale 2 puffs into the lungs in the morning and at bedtime.   dapagliflozin  propanediol 10 MG Tabs tablet Commonly known as: Farxiga  Take 1 tablet (10 mg total) by mouth daily.   fluticasone  50 MCG/ACT nasal spray Commonly known as: FLONASE  Place 1 spray into both nostrils daily.   HYDROcodone -acetaminophen  5-325 MG tablet Commonly known as: NORCO/VICODIN Take 1 tablet by mouth every 8 (eight) hours as needed for up to 3 days.   Opsumit  10 MG tablet Generic drug: macitentan  TAKE 1 TABLET DAILY   rosuvastatin  10 MG tablet Commonly known as: CRESTOR  TAKE 1 TABLET BY MOUTH EVERY DAY   semaglutide -weight management 2.4 MG/0.75ML Soaj SQ  injection Commonly known as: WEGOVY  Inject 2.4 mg into the skin once a week.    tadalafil  (PAH) 20 MG tablet Commonly known as: ADCIRCA  Take 2 tablets (40 mg total) by mouth daily.   Ventolin  HFA 108 (90 Base) MCG/ACT inhaler Generic drug: albuterol  Inhale 1-2 puffs into the lungs every 4 (four) hours as needed for wheezing.          Follow-up Information     Rodolph Romano, MD Follow up in 2 week(s).   Specialty: General Surgery Why: 2 weeks robotic assisted lap cholecystectomy Contact information: [address] [place] [address] [phone]                  Time spent on discharge management including discussion of hospital course, clinical condition, outpatient instructions, prescriptions, and follow up with the patient and members of the medical team: >30 minutes  Carla More Barrientos PA-C

## 2024-11-03 NOTE — Telephone Encounter (Signed)
 Patient called to express issues with tadalafil  cost. Prior authorization was submitted and approved. Called [facility] to confirm it will be filled on insurance. Patient is also currently admitted for cholecystectomy. This is very likely due to tirzepatide  provided by [employer]. Advised patient not to continue treatment with tirzepatide  until discussed with her physician.

## 2024-11-03 NOTE — Discharge Instructions (Signed)

## 2024-11-04 LAB — SURGICAL PATHOLOGY

## 2024-11-10 ENCOUNTER — Other Ambulatory Visit: Payer: Self-pay

## 2024-11-10 MED ORDER — ROSUVASTATIN CALCIUM 10 MG PO TABS: 10.0000 mg | ORAL_TABLET | Freq: Every day | ORAL | 1 refills | Status: AC
